# Patient Record
Sex: Female | Born: 1970 | Race: Black or African American | Hispanic: No | Marital: Married | Smoking: Never smoker
Health system: Southern US, Community
[De-identification: ages and names within clinical notes are randomized; demographics above are authoritative.]

## PROBLEM LIST (undated history)

## (undated) DIAGNOSIS — I493 Ventricular premature depolarization: Secondary | ICD-10-CM

## (undated) DIAGNOSIS — C50919 Malignant neoplasm of unspecified site of unspecified female breast: Secondary | ICD-10-CM

## (undated) DIAGNOSIS — Z79810 Long term (current) use of selective estrogen receptor modulators (SERMs): Secondary | ICD-10-CM

## (undated) DIAGNOSIS — E785 Hyperlipidemia, unspecified: Secondary | ICD-10-CM

## (undated) DIAGNOSIS — F329 Major depressive disorder, single episode, unspecified: Secondary | ICD-10-CM

## (undated) DIAGNOSIS — Z923 Personal history of irradiation: Secondary | ICD-10-CM

## (undated) DIAGNOSIS — E119 Type 2 diabetes mellitus without complications: Secondary | ICD-10-CM

## (undated) DIAGNOSIS — Z9221 Personal history of antineoplastic chemotherapy: Secondary | ICD-10-CM

## (undated) DIAGNOSIS — F419 Anxiety disorder, unspecified: Secondary | ICD-10-CM

## (undated) HISTORY — DX: Hyperlipidemia, unspecified: E78.5

## (undated) HISTORY — DX: Major depressive disorder, single episode, unspecified: F32.9

## (undated) HISTORY — PX: LIVER BIOPSY: SHX301

## (undated) HISTORY — DX: Anxiety disorder, unspecified: F41.9

## (undated) HISTORY — DX: Ventricular premature depolarization: I49.3

## (undated) HISTORY — DX: Malignant neoplasm of unspecified site of unspecified female breast: C50.919

---

## 1999-08-18 HISTORY — PX: CYSTOSCOPY: SUR368

## 2000-11-23 ENCOUNTER — Inpatient Hospital Stay (HOSPITAL_COMMUNITY): Admission: AD | Admit: 2000-11-23 | Discharge: 2000-11-25 | Payer: Self-pay | Admitting: Obstetrics and Gynecology

## 2001-06-17 ENCOUNTER — Other Ambulatory Visit: Admission: RE | Admit: 2001-06-17 | Discharge: 2001-06-17 | Payer: Self-pay | Admitting: Internal Medicine

## 2002-06-19 ENCOUNTER — Ambulatory Visit (HOSPITAL_COMMUNITY): Admission: RE | Admit: 2002-06-19 | Discharge: 2002-06-19 | Payer: Self-pay | Admitting: Internal Medicine

## 2002-06-19 ENCOUNTER — Encounter: Payer: Self-pay | Admitting: Internal Medicine

## 2002-08-17 HISTORY — PX: TUBAL LIGATION: SHX77

## 2002-08-17 HISTORY — PX: NEVUS EXCISION: SHX2090

## 2002-08-17 HISTORY — PX: RECTOCELE REPAIR: SHX761

## 2003-02-06 ENCOUNTER — Ambulatory Visit (HOSPITAL_COMMUNITY): Admission: RE | Admit: 2003-02-06 | Discharge: 2003-02-06 | Payer: Self-pay | Admitting: Obstetrics and Gynecology

## 2003-02-09 ENCOUNTER — Inpatient Hospital Stay (HOSPITAL_COMMUNITY): Admission: RE | Admit: 2003-02-09 | Discharge: 2003-02-12 | Payer: Self-pay | Admitting: Obstetrics and Gynecology

## 2003-04-19 ENCOUNTER — Ambulatory Visit (HOSPITAL_COMMUNITY): Admission: RE | Admit: 2003-04-19 | Discharge: 2003-04-19 | Payer: Self-pay | Admitting: Obstetrics and Gynecology

## 2004-11-04 ENCOUNTER — Ambulatory Visit: Payer: Self-pay | Admitting: Family Medicine

## 2004-11-13 ENCOUNTER — Ambulatory Visit (HOSPITAL_COMMUNITY): Admission: RE | Admit: 2004-11-13 | Discharge: 2004-11-13 | Payer: Self-pay | Admitting: Family Medicine

## 2004-11-17 ENCOUNTER — Ambulatory Visit: Payer: Self-pay | Admitting: Family Medicine

## 2007-03-11 ENCOUNTER — Ambulatory Visit: Payer: Self-pay | Admitting: Internal Medicine

## 2007-03-23 ENCOUNTER — Ambulatory Visit (HOSPITAL_COMMUNITY): Admission: RE | Admit: 2007-03-23 | Discharge: 2007-03-23 | Payer: Self-pay | Admitting: Internal Medicine

## 2007-03-24 ENCOUNTER — Telehealth (INDEPENDENT_AMBULATORY_CARE_PROVIDER_SITE_OTHER): Payer: Self-pay | Admitting: *Deleted

## 2007-03-25 ENCOUNTER — Encounter (INDEPENDENT_AMBULATORY_CARE_PROVIDER_SITE_OTHER): Payer: Self-pay | Admitting: Internal Medicine

## 2007-08-18 ENCOUNTER — Encounter: Payer: Self-pay | Admitting: Physician Assistant

## 2007-09-16 ENCOUNTER — Encounter (INDEPENDENT_AMBULATORY_CARE_PROVIDER_SITE_OTHER): Payer: Self-pay | Admitting: Internal Medicine

## 2007-09-16 ENCOUNTER — Ambulatory Visit: Payer: Self-pay | Admitting: Internal Medicine

## 2007-09-16 ENCOUNTER — Other Ambulatory Visit: Admission: RE | Admit: 2007-09-16 | Discharge: 2007-09-16 | Payer: Self-pay | Admitting: Internal Medicine

## 2007-09-16 DIAGNOSIS — J310 Chronic rhinitis: Secondary | ICD-10-CM | POA: Insufficient documentation

## 2007-09-16 LAB — CONVERTED CEMR LAB
Bacteria, UA: NONE SEEN
Bilirubin Urine: NEGATIVE
Ketones, urine, test strip: NEGATIVE
Protein, U semiquant: NEGATIVE
WBC Urine, dipstick: NEGATIVE
WBC, UA: NONE SEEN cells/hpf (ref <3)
pH: 6.5

## 2007-09-17 ENCOUNTER — Encounter (INDEPENDENT_AMBULATORY_CARE_PROVIDER_SITE_OTHER): Payer: Self-pay | Admitting: Internal Medicine

## 2007-09-20 ENCOUNTER — Encounter (INDEPENDENT_AMBULATORY_CARE_PROVIDER_SITE_OTHER): Payer: Self-pay | Admitting: Internal Medicine

## 2007-09-20 ENCOUNTER — Telehealth (INDEPENDENT_AMBULATORY_CARE_PROVIDER_SITE_OTHER): Payer: Self-pay | Admitting: *Deleted

## 2007-09-21 ENCOUNTER — Telehealth (INDEPENDENT_AMBULATORY_CARE_PROVIDER_SITE_OTHER): Payer: Self-pay | Admitting: *Deleted

## 2007-09-21 LAB — CONVERTED CEMR LAB
BUN: 10 mg/dL (ref 6–23)
CO2: 24 meq/L (ref 19–32)
Calcium: 8.6 mg/dL (ref 8.4–10.5)
Creatinine, Ser: 0.7 mg/dL (ref 0.40–1.20)
Potassium: 3.6 meq/L (ref 3.5–5.3)
Sodium: 141 meq/L (ref 135–145)
VLDL: 10 mg/dL (ref 0–40)

## 2007-09-23 ENCOUNTER — Encounter (INDEPENDENT_AMBULATORY_CARE_PROVIDER_SITE_OTHER): Payer: Self-pay | Admitting: Internal Medicine

## 2008-03-16 ENCOUNTER — Ambulatory Visit: Payer: Self-pay | Admitting: Internal Medicine

## 2008-11-27 ENCOUNTER — Ambulatory Visit: Payer: Self-pay | Admitting: Internal Medicine

## 2008-11-27 ENCOUNTER — Other Ambulatory Visit: Admission: RE | Admit: 2008-11-27 | Discharge: 2008-11-27 | Payer: Self-pay | Admitting: Internal Medicine

## 2008-11-27 ENCOUNTER — Encounter (INDEPENDENT_AMBULATORY_CARE_PROVIDER_SITE_OTHER): Payer: Self-pay | Admitting: Internal Medicine

## 2009-03-19 ENCOUNTER — Ambulatory Visit: Payer: Self-pay | Admitting: Internal Medicine

## 2009-03-20 ENCOUNTER — Encounter (INDEPENDENT_AMBULATORY_CARE_PROVIDER_SITE_OTHER): Payer: Self-pay | Admitting: Internal Medicine

## 2009-11-26 ENCOUNTER — Ambulatory Visit: Payer: Self-pay | Admitting: Family Medicine

## 2009-11-26 DIAGNOSIS — R109 Unspecified abdominal pain: Secondary | ICD-10-CM | POA: Insufficient documentation

## 2009-11-26 DIAGNOSIS — E669 Obesity, unspecified: Secondary | ICD-10-CM

## 2009-11-27 ENCOUNTER — Encounter: Payer: Self-pay | Admitting: Physician Assistant

## 2009-11-27 LAB — CONVERTED CEMR LAB: Gardnerella vaginalis: POSITIVE — AB

## 2009-12-06 ENCOUNTER — Ambulatory Visit (HOSPITAL_COMMUNITY): Admission: RE | Admit: 2009-12-06 | Discharge: 2009-12-06 | Payer: Self-pay | Admitting: Family Medicine

## 2009-12-17 ENCOUNTER — Encounter: Payer: Self-pay | Admitting: Physician Assistant

## 2009-12-17 ENCOUNTER — Ambulatory Visit: Payer: Self-pay | Admitting: Family Medicine

## 2009-12-17 DIAGNOSIS — K644 Residual hemorrhoidal skin tags: Secondary | ICD-10-CM | POA: Insufficient documentation

## 2009-12-20 ENCOUNTER — Encounter: Admission: RE | Admit: 2009-12-20 | Discharge: 2009-12-20 | Payer: Self-pay | Admitting: Family Medicine

## 2010-05-14 ENCOUNTER — Emergency Department (HOSPITAL_COMMUNITY): Admission: EM | Admit: 2010-05-14 | Discharge: 2010-05-15 | Payer: Self-pay | Admitting: Emergency Medicine

## 2010-05-15 ENCOUNTER — Ambulatory Visit: Payer: Self-pay | Admitting: Family Medicine

## 2010-05-15 ENCOUNTER — Encounter: Payer: Self-pay | Admitting: Cardiology

## 2010-05-15 ENCOUNTER — Encounter (INDEPENDENT_AMBULATORY_CARE_PROVIDER_SITE_OTHER): Payer: Self-pay | Admitting: *Deleted

## 2010-05-15 ENCOUNTER — Encounter: Payer: Self-pay | Admitting: Physician Assistant

## 2010-05-15 DIAGNOSIS — J039 Acute tonsillitis, unspecified: Secondary | ICD-10-CM

## 2010-05-15 DIAGNOSIS — M79609 Pain in unspecified limb: Secondary | ICD-10-CM

## 2010-05-15 LAB — CONVERTED CEMR LAB
ALT: 20 units/L
AST: 16 units/L
AST: 16 units/L
AST: 16 units/L
Albumin: 4.3 g/dL
Albumin: 4.3 g/dL
Alkaline Phosphatase: 66 units/L
Alkaline Phosphatase: 66 units/L
BUN: 8 mg/dL
Calcium: 9.3 mg/dL
Calcium: 9.3 mg/dL
Calcium: 9.3 mg/dL
Chloride: 104 meq/L
Cholesterol: 235 mg/dL
Creatinine, Ser: 0.81 mg/dL
Creatinine, Ser: 0.81 mg/dL
Glucose, Bld: 111 mg/dL
Glucose, Bld: 111 mg/dL
HCT: 40.5 %
HDL: 56 mg/dL
HDL: 56 mg/dL
Hemoglobin: 13.3 g/dL
Hemoglobin: 13.3 g/dL
Hgb A1c MFr Bld: 6.5 %
LDL Cholesterol: 168 mg/dL
LDL Cholesterol: 168 mg/dL
MCV: 81 fL
Platelets: 318 10*3/uL
Platelets: 318 10*3/uL
Platelets: 318 10*3/uL
Potassium: 3.6 meq/L
RDW: 13.9 %
Sodium: 140 meq/L
Sodium: 140 meq/L
Total Protein: 7.1 g/dL
Total Protein: 7.1 g/dL
Triglycerides: 54 mg/dL
WBC: 4.9 10*3/uL
WBC: 4.9 10*3/uL

## 2010-05-16 ENCOUNTER — Ambulatory Visit: Payer: Self-pay | Admitting: Family Medicine

## 2010-05-16 ENCOUNTER — Encounter (INDEPENDENT_AMBULATORY_CARE_PROVIDER_SITE_OTHER): Payer: Self-pay | Admitting: *Deleted

## 2010-05-16 ENCOUNTER — Encounter: Payer: Self-pay | Admitting: Physician Assistant

## 2010-05-19 ENCOUNTER — Ambulatory Visit: Payer: Self-pay | Admitting: Cardiology

## 2010-05-19 DIAGNOSIS — R9431 Abnormal electrocardiogram [ECG] [EKG]: Secondary | ICD-10-CM | POA: Insufficient documentation

## 2010-05-19 DIAGNOSIS — R002 Palpitations: Secondary | ICD-10-CM | POA: Insufficient documentation

## 2010-05-19 DIAGNOSIS — E119 Type 2 diabetes mellitus without complications: Secondary | ICD-10-CM

## 2010-05-19 DIAGNOSIS — E785 Hyperlipidemia, unspecified: Secondary | ICD-10-CM

## 2010-05-20 ENCOUNTER — Encounter: Payer: Self-pay | Admitting: Cardiology

## 2010-05-23 ENCOUNTER — Encounter: Payer: Self-pay | Admitting: Cardiology

## 2010-05-23 ENCOUNTER — Ambulatory Visit (HOSPITAL_COMMUNITY): Admission: RE | Admit: 2010-05-23 | Discharge: 2010-05-23 | Payer: Self-pay | Admitting: Cardiology

## 2010-05-23 ENCOUNTER — Ambulatory Visit: Payer: Self-pay | Admitting: Cardiology

## 2010-06-05 ENCOUNTER — Ambulatory Visit: Payer: Self-pay | Admitting: Family Medicine

## 2010-06-27 ENCOUNTER — Encounter (INDEPENDENT_AMBULATORY_CARE_PROVIDER_SITE_OTHER): Payer: Self-pay | Admitting: *Deleted

## 2010-07-30 ENCOUNTER — Encounter (INDEPENDENT_AMBULATORY_CARE_PROVIDER_SITE_OTHER): Payer: Self-pay | Admitting: *Deleted

## 2010-08-05 ENCOUNTER — Ambulatory Visit: Payer: Self-pay | Admitting: Cardiology

## 2010-08-07 ENCOUNTER — Encounter: Payer: Self-pay | Admitting: Cardiology

## 2010-08-20 LAB — HM PAP SMEAR

## 2010-08-20 LAB — HM MAMMOGRAPHY

## 2010-08-26 ENCOUNTER — Encounter: Payer: Self-pay | Admitting: Family Medicine

## 2010-08-26 ENCOUNTER — Ambulatory Visit: Admit: 2010-08-26 | Payer: Self-pay | Admitting: Family Medicine

## 2010-09-07 ENCOUNTER — Encounter: Payer: Self-pay | Admitting: Family Medicine

## 2010-09-14 LAB — CONVERTED CEMR LAB
ALT: 20 units/L (ref 0–35)
AST: 16 units/L (ref 0–37)
Albumin: 4.3 g/dL (ref 3.5–5.2)
CO2: 24 meq/L (ref 19–32)
Chloride: 104 meq/L (ref 96–112)
Cholesterol: 235 mg/dL — ABNORMAL HIGH (ref 0–200)
Glucose, Bld: 111 mg/dL — ABNORMAL HIGH (ref 70–99)
HDL: 56 mg/dL (ref >39)
Hemoglobin: 13.3 g/dL (ref 12.0–15.0)
LDL Cholesterol: 168 mg/dL — ABNORMAL HIGH (ref 0–99)
MCV: 81 fL (ref 78.0–100.0)
Platelets: 318 10*3/uL (ref 150–400)
Potassium: 3.6 meq/L (ref 3.5–5.3)
RBC: 5 M/uL (ref 3.87–5.11)
Sodium: 140 meq/L (ref 135–145)
Total CHOL/HDL Ratio: 4.2
Triglycerides: 54 mg/dL (ref <150)
VLDL: 11 mg/dL (ref 0–40)

## 2010-09-16 NOTE — Assessment & Plan Note (Signed)
Summary: Consult from Dr. Lodema Hong for PVCs and EKG abnormalities   Visit Type:  Initial Consult Referring Provider:  Dr. [name] Primary Provider:  Esperanza Sheets PA   History of Present Illness: [name] is seen in the office today at the kind request of Esperanza Sheets and Dr. Lodema Hong for evaluation of EKG abnormalities and ventricular arrhythmias.  This nice woman has enjoyed generally excellent health.  She recently was found to have borderline diabetes and moderate hyperlipidemia, but is deferring medical therapy while she attempts to modify diet, exercise and body weight.  She is made and enviable start, losing more than 20 pounds and exercising regularly.  She has no history of cardiac problems nor has she previously been evaluated by a cardiologist or undergone any significant cardiac testing.  Exercise tolerance is good without dyspnea, chest discomfort, lightheadedness, syncope, orthopnea, PND or pedal edema.  She is premenopausal.  She is a lifelong nonsmoker.  Patient notes palpitations at night that are not suggestive of ventricular bigeminy.  She senses a rapid regular tachycardia lasting seconds to minutes.  On one occasion, she experienced numbness and discomfort in her left arm.  She recently was treated with Augmentin for pharyngitis and developed severe diarrhea.  Current Medications (verified): 1)  None  Allergies: 1)  ! Flagyl 2)  Sulfa  Comments:  Nurse/Medical Assistant: patient was suppose to be on metformin 500 mg two times a day and pravastatin  but patient hasn't started either one of these  medicine wanted to wait 1 month she is changing her eating habits and exercising wants to see if this will help.  Past History:  Past Surgical History: Last updated: 06-02-2010 Tubal ligation-2004 Rectocele repair-2004 Cystoscopy-2001 Excision of facial nevi-2004 Normal spontaneous vaginal deliveries-2002 and 2004  Family History: Last updated:  Jun 02, 2010 Father-deceased-approximately age 44; cause unknown Mother-deceased-80-HTN, DM, MI Siblings: 4 brothers, one of whom has hypertension and diabetes and one hypertension alone; 4 sisters, one with hypertension,  and one with diabetes  Social History: Last updated: 06-02-10 Married lives with spouse; 2 sons age 20 and 66 Occupation: Agricultural engineer at [employer] Never Smoked Alcohol use-no Drug use-no  Past Medical History: Diabetes-diet therapy Hyperlipidemia-diet therapy per patient request 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 Hematuria--evaluated in [place]--?2000/2001 with negative results     Family History: Father-deceased-approximately age 16; cause unknown Mother-deceased-80-HTN, DM, MI Siblings: 4 brothers, one of whom has hypertension and diabetes and one hypertension alone; 4 sisters, one with hypertension,  and one with diabetes  Social History: Married lives with spouse; 2 sons age 74 and 65 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  Vital Signs:  Patient profile:   40 year old female Menstrual status:  regular Weight:      173 pounds BMI:     29.12 Pulse rate:   89 / minute BP sitting:   135 / 82  (right arm)  Vitals Entered By: Dreama Saa, CNA 06-02-10 2:49 PM)  Physical Exam  General:  Overweight; well-developed; no acute distress: HEENT-[place]/AT; PERRL; EOM intact; conjunctiva and lids nl:  Neck-No JVD; no carotid bruits: Endocrine-borderline diffuse thyromegaly: Lungs-No tachypnea, clear without rales, rhonchi or wheezes: CV-normal PMI; normal S1 and S2:;  Abdomen-BS normal; soft and non-tender without masses or organomegaly: MS-No deformities, cyanosis or clubbing: Neurologic-Nl  cranial nerves; symmetric strength and tone: Skin- Warm, no sig.  lesions: Extremities-Nl distal pulses; no edema    Impression & Recommendations:  Problem # 1:  ELECTROCARDIOGRAM, ABNORMAL (ICD-794.31) Patient has T wave inversion in the inferior leads at this visit, whereas EKG was morphologically normal except for the presence of frequent PVCs just 3 days ago.  An echocardiogram will be obtained to exclude structural heart disease.  TSH and other basic laboratory studies were normal.    Problem # 2:  HYPERLIPIDEMIA (ICD-272.4) Patient has moderate hyperlipidemia.  In the very low risk of coronary disease, treatment can be dietary at the present time.  Problem # 3:  DIABETES MELLITUS, TYPE II (ICD-250.00) Patient prefers nonpharmacologic therapy for her diabetes.  She was strongly encouraged with regard to her success in weight reduction and exercise to date.  Problem # 4:  PALPITATIONS (ICD-785.1) Ms. Dow has palpitations that do not sound as if they are related to PVCs.  A supraventricular tachycardia is possible.  If the symptoms persist, she will be provided with an event recorder in an attempt to document a significant arrhythmia.  I will reassess this nice woman and 1-2 months at which time an EKG will be repeated.  Other Orders: 2-D Echocardiogram (2D Echo)  EKG  Procedure date:  05/19/2010  Findings:      Normal sinus rhythm with late cycle PVCs in a bigeminal pattern Nondiagnostic Q waves in leads I, L, and V3-V6. Inferior T-wave inversion Comparison with previous tracing of 05/15/10: R Wave progression is now normal; inferior T waves are now inverted.   Patient Instructions: 1)  Your physician recommends that you schedule a follow-up appointment in: 2 months 2)  Your physician has requested that you have an echocardiogram.  Echocardiography is a painless test that uses sound waves to create images of your heart. It provides your doctor with information about the size and shape of your heart and how well your heart's chambers and  valves are working.  This procedure takes approximately one hour. There are no restrictions for this procedure.

## 2010-09-16 NOTE — Letter (Signed)
Summary: [place] [employer], agricultural at [employer] [facility]  [address] [place] [address]   Phone: [phone]  Fax: [phone]      June 27, 2010 MRN: [id number]   Brenda Avery [address] [place], [place]  [address]   Dear Ms. Schupp,  Your test ordered by Selena Batten has been reviewed by your physician (or physician assistant) and was found to be normal or stable. Your physician (or physician assistant) felt no changes were needed at this time.  __x__ Echocardiogram  ____ Cardiac Stress Test  ____ Lab Work  ____ Peripheral vascular study of arms, legs or neck  ____ CT scan or X-ray  ____ Lung or Breathing test  ____ Other:  No change in medical treatment at this time, per Dr. Dietrich Pates.  Thank you, Tammy Allyne Gee RN    Idamay Bing, MD, Lenise Arena.C.Gaylord Shih, MD, F.A.C.C Lewayne Bunting, MD, F.A.C.C Nona Dell, MD, F.A.C.C Charlton Haws, MD, Lenise Arena.C.C

## 2010-09-16 NOTE — Letter (Signed)
Summary: Out of Work  [facility]  [address]   [place], [place] [address]   Phone: [phone]  Fax: [phone]    Dec 17, 2009   Employee:  JASELLE PRYER    To Whom It May Concern:   For Medical reasons, please excuse the above named employee from work for the following dates:  Start:   12/17/09  End:   12/19/09 may return to work   If you need additional information, please feel free to contact our office.         Sincerely,    Esperanza Sheets PA

## 2010-09-16 NOTE — Assessment & Plan Note (Signed)
Summary: er follow up- room 2   Vital Signs:  Patient profile:   40 year old female Menstrual status:  regular Height:      64.75 inches Weight:      177.50 pounds BMI:     29.87 O2 Sat:      99 % on Room air Pulse rate:   78 / minute Resp:     16 per minute BP sitting:   112 / 62  (left arm)  Vitals Entered By: Adella Hare LPN (May 15, 2010 9:20 AM) CC: er follow up- arm numbness Is Patient Diabetic? No Pain Assessment Patient in pain? no        Primary Provider:  Esperanza Sheets PA  CC:  er follow up- arm numbness.  History of Present Illness: Pt was seen at ER early this am. Was at home lying down.  Felt numbness in Lt arm and felt like her heart was beating fast.  She stood up "and felt like I was sinking."  No chest pain, dib or diaphoresis.  Has felt like she has been having irrg heart beats for a few weeks now.  Notices it more at night when she is lying in bed. The numbness was from her shoulder to her wrist.  Also had pain on the Lt side of her neck. Pt states the ER checked her BP only.  No labs, EKG, etc.  Also has been on Augmentin for sore throat x 3 days.  Had developed some diarrhea.  So ER wrote a prescription for ZPak.  Throat still feels a little swollen and sore.  Feels hard to swallow. No fever or chills.  Allergies (verified): 1)  ! Flagyl 2)  Sulfa  Past History:  Past medical history reviewed for relevance to current acute and chronic problems.  Past Medical History: Reviewed history from 09/16/2007 and no changes required. microscopic hematuria--evaluated in [place]--?2000/2001  Family History: father-deceased-63-DKA mother-deceased-80-HTN, DM, MI Siblings x8--HTN in older sibs 2 sons  Review of Systems General:  Denies chills and fever. ENT:  Complains of sore throat; denies earache and nasal congestion. CV:  Complains of palpitations; denies chest pain or discomfort. Resp:  Complains of cough; denies sputum productive. MS:   Denies joint pain and thoracic pain.  Physical Exam  General:  Well-developed,well-nourished,in no acute distress; alert,appropriate and cooperative throughout examination Head:  Normocephalic and atraumatic without obvious abnormalities. No apparent alopecia or balding. Ears:  External ear exam shows no significant lesions or deformities.  Otoscopic examination reveals clear canals, tympanic membranes are intact bilaterally without bulging, retraction, inflammation or discharge. Hearing is grossly normal bilaterally. Nose:  External nasal examination shows no deformity or inflammation. Nasal mucosa are pink and moist without lesions or exudates. Mouth:  good dentition.  tonsils 2+ bilat and erythematousgood dentition.   Neck:  No deformities, masses, or tenderness noted. Lungs:  Normal respiratory effort, chest expands symmetrically. Lungs are clear to auscultation, no crackles or wheezes. Heart:  no murmur and irregular rhythm.  no murmur and irregular rhythm.   Msk:  C Spine and LUE:  FROM.  Muscular TTP across posterior shoulder and upper arm.  Nontender remainder of UE, spinous process and shoulder joint.   Pulses:  R radial normal and L radial normal.  R radial normal and L radial normal.   Extremities:  No clubbing, cyanosis, edema, or deformity noted with normal full range of motion of all joints.   Neurologic:  alert & oriented X3, strength normal in  all extremities, sensation intact to light touch, gait normal, and DTRs symmetrical and normal.  alert & oriented X3, strength normal in all extremities, sensation intact to light touch, gait normal, and DTRs symmetrical and normal.   Skin:  Intact without suspicious lesions or rashes Cervical Nodes:  No lymphadenopathy noted Psych:  Cognition and judgment appear intact. Alert and cooperative with normal attention span and concentration. No apparent delusions, illusions, hallucinations   Impression & Recommendations:  Problem # 1:   TONSILLITIS, ACUTE (ICD-463) Assessment New Begin ZPak as Rxd by ER.  Problem # 2:  BIGEMINY (ICD-427.89) Assessment: New  Orders: EKG w/ Interpretation (93000) Cardiology Referral (Cardiology)  Problem # 3:  ARM PAIN, LEFT (ICD-729.5) Assessment: New Use Ibuprofen and Heat to affected areas.  Patient Instructions: 1)  Please schedule a follow-up appointment in 2 weeks. 2)  I have referred you to the cardiologist for your irregular heart beats. 3)  Start the new antibiotic prescribed by the ER. 4)  Use Ibuprofen as needed for discomfort. 5)  Use warm compresses to your shoulder.

## 2010-09-16 NOTE — Miscellaneous (Signed)
Summary: labs cbcd,cmp,lipid,tsh,a1c,05/15/2010  Clinical Lists Changes  Observations: Added new observation of CALCIUM: 9.3 mg/dL (19/14/7829 5:62) Added new observation of ALBUMIN: 4.3 g/dL (13/03/6577 4:69) Added new observation of PROTEIN, TOT: 7.1 g/dL (62/95/2841 3:24) Added new observation of SGPT (ALT): 20 units/L (05/15/2010 9:15) Added new observation of SGOT (AST): 16 units/L (05/15/2010 9:15) Added new observation of ALK PHOS: 66 units/L (05/15/2010 9:15) Added new observation of CREATININE: 0.81 mg/dL (40/05/2724 3:66) Added new observation of BUN: 8 mg/dL (44/10/4740 5:95) Added new observation of BG RANDOM: 111 mg/dL (63/87/5643 3:29) Added new observation of CO2 PLSM/SER: 24 meq/L (05/15/2010 9:15) Added new observation of CL SERUM: 104 meq/L (05/15/2010 9:15) Added new observation of K SERUM: 3.6 meq/L (05/15/2010 9:15) Added new observation of NA: 140 meq/L (05/15/2010 9:15) Added new observation of LDL: 168 mg/dL (51/88/4166 0:63) Added new observation of HDL: 56 mg/dL (01/60/1093 2:35) Added new observation of TRIGLYC TOT: 54 mg/dL (57/32/2025 4:27) Added new observation of CHOLESTEROL: 235 mg/dL (02/07/7627 3:15) Added new observation of TSH: 2.321 microintl units/mL (05/15/2010 9:15) Added new observation of HGBA1C: 6.5 % (05/15/2010 9:15) Added new observation of PLATELETK/UL: 318 K/uL (05/15/2010 9:15) Added new observation of RDW: 13.9 % (05/15/2010 9:15) Added new observation of MCHC RBC: 32.8 g/dL (17/61/6073 7:10) Added new observation of MCV: 81.0 fL (05/15/2010 9:15) Added new observation of HCT: 40.5 % (05/15/2010 9:15) Added new observation of HGB: 13.3 g/dL (62/69/4854 6:27) Added new observation of RBC M/UL: 5.00 M/uL (05/15/2010 9:15) Added new observation of WBC COUNT: 4.9 10*3/microliter (05/15/2010 9:15)

## 2010-09-16 NOTE — Letter (Signed)
Summary: FMLA PAPERS  [employer]MLA PAPERS   Imported By: Lind Guest 05/19/2010 08:31:29  _____________________________________________________________________  External Attachment:    Type:   Image     Comment:   External Document

## 2010-09-16 NOTE — Assessment & Plan Note (Signed)
Summary: new patient- room 1   Vital Signs:  Patient profile:   40 year old female Menstrual status:  regular Height:      64.75 inches Weight:      192.50 pounds BMI:     32.40 O2 Sat:      98 % on Room air Pulse rate:   92 / minute Resp:     16 per minute BP sitting:   110 / 70  (left arm)  Vitals Entered By: Adella Hare LPN (November 26, 2009 10:14 AM) CC: new patient Is Patient Diabetic? No Pain Assessment Patient in pain? no      Comments patient mentions some intermittent ovary discomfort, mild to moderate   Primary Provider:  Esperanza Sheets PA  CC:  new patient.  History of Present Illness: New pt here to establish care with new PCP.  Pt c/o RLQ abd/pelvic pain intermittently x few days. "Ovary pain." LMP approx 11-04-09.  Have been irrg x 6-8 mos.  Prev would start on specific day, now comes within a week of when she would expect it. Has had pain similar to this in past. " Little" vag dischg x 2-3 days.  Tylenol works well to relieve pain.  Last physical /pap 1 yr ago.  Last Mamm 2008.  Allergies: 1)  Sulfa  Past History:  Past medical, surgical, family and social histories (including risk factors) reviewed for relevance to current acute and chronic problems.  Past Medical History: Reviewed history from 09/16/2007 and no changes required. microscopic hematuria--evaluated in [place]--?2000/2001  Past Surgical History: Reviewed history from 09/16/2007 and no changes required. Tubal ligation rectal wall repair cystoscopy  Family History: Reviewed history from 11/27/2008 and no changes required. father-deceased-63-DKA mother-deceased-80-HTN, DM Siblings x8--HTN in older sibs 2 sons  Social History: Reviewed history from 03/11/2007 and no changes required. 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 GI:   Complains of abdominal pain; denies bloody stools, change in bowel habits, constipation, vomiting, and vomiting blood. GU:  Denies dysuria, incontinence, and urinary frequency.  Physical Exam  General:  Well-developed,well-nourished,in no acute distress; alert,appropriate and cooperative throughout examination Head:  Normocephalic and atraumatic without obvious abnormalities. No apparent alopecia or balding. Ears:  External ear exam shows no significant lesions or deformities.  Otoscopic examination reveals clear canals, tympanic membranes are intact bilaterally without bulging, retraction, inflammation or discharge. Hearing is grossly normal bilaterally. Nose:  External nasal examination shows no deformity or inflammation. Nasal mucosa are pink and moist without lesions or exudates. Mouth:  Oral mucosa and oropharynx without lesions or exudates.  Teeth in good repair. Neck:  No deformities, masses, or tenderness noted. Lungs:  Normal respiratory effort, chest expands symmetrically. Lungs are clear to auscultation, no crackles or wheezes. Heart:  Normal rate and regular rhythm. S1 and S2 normal without gallop, murmur, click, rub or other extra sounds. Abdomen:  soft, no masses, no guarding, no rigidity, no hepatomegaly, and no splenomegaly.  soft, no masses, no guarding, no rigidity, no hepatomegaly, and no splenomegaly.   Genitalia:  normal introitus, no external lesions, mucosa pink and moist, no vaginal or cervical lesions, and normal uterus size and position.  Small amount of thin clear vag dischg.  Bimanual exam neg except tender Rt ovary.  No masses. Cervical Nodes:  No lymphadenopathy noted Psych:  Cognition and judgment appear intact. Alert and cooperative with normal  attention span and concentration. No apparent delusions, illusions, hallucinations   Impression & Recommendations:  Problem # 1:  PELVIC PAIN, ACUTE (ICD-789.09) Assessment New  Orders: Pelvic Ultrasound (Sono  Pelvis)  Problem # 2:  VAGINITIS (ICD-616.10) Assessment: New Will await wet prep results for treatment if needed. Orders: T-Wet Prep by [facility] [phone])  Complete Medication List: 1)  Mylanta 200-200-20 Mg/61ml Susp (Alum & mag hydroxide-simeth) .... 2 teaspoons as needed  Other Orders: Miscellaneous Other Radiology (Misc Other Rad) T-Comprehensive Metabolic Panel [phone]) T-Lipid Profile [phone]) T-CBC No Diff ([id number]) T-TSH [id number]) T-Vitamin D (25-Hydroxy) [id number]) T- Hemoglobin A1C ([id number])  Patient Instructions: 1)  Please schedule a follow-up appointment in 1 month. 2)  You may use Tylenol or Aleve for pain as needed. 3)  I have ordered a Pelvic ultrasound.  This will be scheduled for you. 4)  If your syptoms change or worsen call the office.

## 2010-09-16 NOTE — Assessment & Plan Note (Signed)
Summary: F UP   Vital Signs:  Patient profile:   40 year old female Menstrual status:  regular Height:      64.75 inches Weight:      173.25 pounds BMI:     29.16 O2 Sat:      99 % on Room air Pulse rate:   89 / minute Resp:     16 per minute BP sitting:   130 / 80  (left arm)  Vitals Entered By: Mauricia Area CMA (June 05, 2010 3:44 PM) CC: follow up   Referring Saraiya Kozma:  Dr. [name] Primary Janelys Glassner:  Esperanza Sheets PA  CC:  follow up.  History of Present Illness: Pt presents today for check up.  Hx of DM and Hyperlipidemia.  Pt is trying to control with wt loss and exercise.  She has lost an additional 4.25 lbs in the last one mos.  She is walking daily and going to an exercise class 4 days a week.  Eating "much healthier".  States she still has intermittent palpitations, but not as much as previuos.  Notices more with anxiety. No chest pain.  Pt is due for her pap.  Mamm UTD, due again 5-12. Td UTD, 8-11.       Current Medications (verified): 1)  None  Allergies (verified): 1)  ! Flagyl 2)  Sulfa  Past History:  Past medical history reviewed for relevance to current acute and chronic problems.  Past Medical History: Reviewed history from 05/19/2010 and no changes required. 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 ENT:  Denies earache, nasal congestion, and sore throat. CV:  Complains of palpitations; denies chest pain or discomfort. Resp:  Denies cough and shortness of breath. GI:  Denies abdominal pain, change in bowel habits, nausea, and vomiting.  Physical Exam  General:  Well-developed,well-nourished,in no acute distress; alert,appropriate and cooperative throughout examination Head:  Normocephalic and atraumatic without obvious  abnormalities. No apparent alopecia or balding. Ears:  External ear exam shows no significant lesions or deformities.  Otoscopic examination reveals clear canals, tympanic membranes are intact bilaterally without bulging, retraction, inflammation or discharge. Hearing is grossly normal bilaterally. Nose:  External nasal examination shows no deformity or inflammation. Nasal mucosa are pink and moist without lesions or exudates. Mouth:  Oral mucosa and oropharynx without lesions or exudates.  Teeth in good repair. Neck:  No deformities, masses, or tenderness noted. Lungs:  Normal respiratory effort, chest expands symmetrically. Lungs are clear to auscultation, no crackles or wheezes. Heart:  Normal rate and regular rhythm. S1 and S2 normal without gallop, murmur, click, rub or other extra sounds. Cervical Nodes:  No lymphadenopathy noted Psych:  Cognition and judgment appear intact. Alert and cooperative with normal attention span and concentration. No apparent delusions, illusions, hallucinations   Impression & Recommendations:  Problem # 1:  PALPITATIONS (ICD-785.1) Assessment Improved  Problem # 2:  DIABETES MELLITUS, TYPE II (ICD-250.00) Assessment: Comment Only  Orders: T-Comprehensive Metabolic Panel [id number]) T- Hemoglobin A1C ([id number])  Labs Reviewed: Creat: 0.81 (05/15/2010)    Reviewed HgBA1c results: 6.5 (05/15/2010)  6.5 (05/15/2010)  Problem # 3:  HYPERLIPIDEMIA (ICD-272.4) Assessment: Comment Only  Orders: T-Comprehensive Metabolic Panel [phone]) T-Lipid Profile [phone])  Labs Reviewed: SGOT: 16 (05/15/2010)   SGPT: 20 (05/15/2010)   HDL:56 (05/15/2010), 56 (05/15/2010)  LDL:168 (05/15/2010), 168 (05/15/2010)  Chol:235 (05/15/2010),  235 (05/15/2010)  Trig:54 (05/15/2010), 54 (05/15/2010)  Patient Instructions: 1)  Schedule pap/physical end of December, beginning of Jan. 2)  Have blood work drawn fasting before your next appt. 3)  Keep you follow  up appt with the cardiologist in December. 4)  Congratulations on your weight loss!  Keep up the good work!   Orders Added: 1)  T-Comprehensive Metabolic Panel [[id number]] 2)  T-Lipid Profile [[id number]] 3)  T- Hemoglobin A1C [[id number]] 4)  Est. Patient Level IV [30865]

## 2010-09-16 NOTE — Letter (Signed)
Summary: rpc chart  rpc chart   Imported By: Curtis Sites 05/29/2010 15:08:09  _____________________________________________________________________  External Attachment:    Type:   Image     Comment:   External Document

## 2010-09-16 NOTE — Assessment & Plan Note (Signed)
Summary: hemmroids?- room 2   Vital Signs:  Patient profile:   40 year old female Menstrual status:  regular Height:      64.75 inches Weight:      188.75 pounds BMI:     31.77 O2 Sat:      99 % on Room air Pulse rate:   98 / minute Resp:     16 per minute BP sitting:   120 / 68  (left arm)  Vitals Entered By: Adella Hare LPN (Dec 18, 5619 1:36 PM) CC: rectal pressure, itching and burning Is Patient Diabetic? No Pain Assessment Patient in pain? no        Primary Provider:  Esperanza Sheets PA  CC:  rectal pressure and itching and burning.  History of Present Illness: Pt is here today concerned that she may have hemorrhoids.  She noticed since yesterday that she has some swelling in her anal area.  It is also painful and itchy.  No bleeding.  Denies constipation though she often does have problems with constipation. States she actually had some loose stools/diarrhea yesterday.  None today.  No abd pain.  Pt also recently had an abn mamm.  Has an appt for f/u studies next week.   Current Medications (verified): 1)  None  Allergies: 1)  ! Flagyl 2)  Sulfa  Past History:  Past medical history reviewed for relevance to current acute and chronic problems.  Past Medical History: Reviewed history from 09/16/2007 and no changes required. microscopic hematuria--evaluated in [place]--?2000/2001  Review of Systems GI:  Complains of diarrhea and hemorrhoids; denies abdominal pain, bloody stools, constipation, and loss of appetite.  Physical Exam  General:  Well-developed,well-nourished,in no acute distress; alert,appropriate and cooperative throughout examination Head:  Normocephalic and atraumatic without obvious abnormalities. No apparent alopecia or balding. Lungs:  Normal respiratory effort, chest expands symmetrically. Lungs are clear to auscultation, no crackles or wheezes. Heart:  Normal rate and regular rhythm. S1 and S2 normal without gallop, murmur, click, rub  or other extra sounds. Abdomen:  soft, non-tender, no masses, no hepatomegaly, and no splenomegaly.   Rectal:  external hemorrhoid(s) x 2 noted.  small, no bleeding. Psych:  Cognition and judgment appear intact. Alert and cooperative with normal attention span and concentration. No apparent delusions, illusions, hallucinations   Impression & Recommendations:  Problem # 1:  HEMORRHOIDS, EXTERNAL (ICD-455.3) Assessment New Warm sitz baths. Discussed hemorrhoids and prevention.  Complete Medication List: 1)  Hydrocortisone Ace-pramoxine 2.5-1 % Crea (Hydrocortisone ace-pramoxine) .... Apply to anal area three times a day for hemorrhoids  Patient Instructions: 1)  Please schedule a follow-up appointment as needed. 2)  High fiber diet & increase fluids to prevent constipation. 3)  Warm soaks in a tub three times a day or as needed. 4)  I have prescribed a cream for you to use three times a day. Prescriptions: HYDROCORTISONE ACE-PRAMOXINE 2.5-1 % CREA (HYDROCORTISONE ACE-PRAMOXINE) apply to anal area three times a day for hemorrhoids  #15 grams x 0   Entered and Authorized by:   Esperanza Sheets PA   Signed by:   Esperanza Sheets PA on 12/17/2009   Method used:   Electronically to        [facility]* (retail)       [address] [place], [place]  [address]       Ph: [phone]       Fax:  [phone]   RxID:   [id number]

## 2010-09-16 NOTE — Assessment & Plan Note (Signed)
Summary: fmla papers   Allergies: 1)  ! Flagyl 2)  Sulfa   Complete Medication List: 1)  Metformin Hcl 500 Mg Tabs (Metformin hcl) .... Take 1 two times a day with food 2)  Pravastatin Sodium 40 Mg Tabs (Pravastatin sodium) .... Take 1 daily at bedtime  Other Orders: Form Completion [phone])

## 2010-09-18 NOTE — Miscellaneous (Signed)
Summary: ECHO 05/23/2010  Clinical Lists Changes  Observations: Added new observation of ECHOINTERP:  Study Conclusions    - Left ventricle: The cavity size was normal. Wall thickness was     normal. Systolic function was normal. The estimated ejection     fraction was in the range of 55% to 60%. Wall motion was normal;     there were no regional wall motion abnormalities.   - Atrial septum: No defect or patent foramen ovale was identified.   Transthoracic echocardiography. M-mode, complete 2D, spectral   Doppler, and color Doppler. Height: Height: 162.6cm. Height: 64in.   Weight: Weight: 79.4kg. Weight: 174.6lb. Body mass index: BMI:   30kg/m^2. Body surface area: BSA: 1.38m^2. Patient status:   Outpatient. Location: Echo laboratory.    --------------------------------------------------------------------  (05/23/2010 15:28)      Echocardiogram  Procedure date:  05/23/2010  Findings:       Study Conclusions    - Left ventricle: The cavity size was normal. Wall thickness was     normal. Systolic function was normal. The estimated ejection     fraction was in the range of 55% to 60%. Wall motion was normal;     there were no regional wall motion abnormalities.   - Atrial septum: No defect or patent foramen ovale was identified.   Transthoracic echocardiography. M-mode, complete 2D, spectral   Doppler, and color Doppler. Height: Height: 162.6cm. Height: 64in.   Weight: Weight: 79.4kg. Weight: 174.6lb. Body mass index: BMI:   30kg/m^2. Body surface area: BSA: 1.59m^2. Patient status:   Outpatient. Location: Echo laboratory.    --------------------------------------------------------------------

## 2010-09-18 NOTE — Letter (Signed)
Summary: 1st missed no show  1st missed no show   Imported By: Lind Guest 08/27/2010 13:26:06  _____________________________________________________________________  External Attachment:    Type:   Image     Comment:   External Document

## 2010-09-18 NOTE — Assessment & Plan Note (Signed)
Summary: F2M   Visit Type:  Follow-up Referring Provider:  Dr. [name] Primary Provider:  Esperanza Sheets PA   History of Present Illness: [name] to the office as scheduled for continued assessment and treatment of hypertension, hyperlipidemia, palpitations and EKG abnormalities.  Since her last visit, she has done extremely well.  She is exercising regularly without any related symptoms.  She has noted no palpitations.  Her weight loss program has stalled in conjunction with increased availability of high caloric food related to the holidays.  She has monitored blood pressure at home and work with entirely normal values noted.  EKG  Procedure date:  08/05/2010  Findings:      Normal sinus rhythm Nondiagnostic inferior Q waves Within normal limits.   Current Medications (verified): 1)  None  Allergies (verified): 1)  ! Flagyl 2)  Sulfa  Comments:  Nurse/Medical Assistant: patient uses [facility]  Past History:  PMH, FH, and Social History reviewed and updated.  Review of Systems       See history of present illness.  Vital Signs:  Patient profile:   40 year old female Menstrual status:  regular Weight:      175 pounds BMI:     29.45 Pulse rate:   79 / minute BP sitting:   131 / 84  (right arm)  Vitals Entered By: Dreama Saa, CNA (August 05, 2010 1:00 PM)  Physical Exam  General:  Overweight, pleasant, and well developed; no acute distress:    Neck-No JVD, no carotid bruits: Lungs-No tachypnea, no rales; no rhonchi;no wheezes:  Cardiovascular-normal PMI; normal S1 and S2; modest basilar early-systolic ejection murmur Abdomen-BS normal; soft and non-tender without masses or organomegaly:  Skin-Warm, no significant lesions: Extremities-Nl distal pulses; no edema:    Impression & Recommendations:  Problem # 1:  ELECTROCARDIOGRAM, ABNORMAL (ICD-794.31) Repeat EKG today is perfectly normal.  The modest irregularities on  previous tracings appear to be of no clinical significance.  Problem # 2:  HYPERLIPIDEMIA (ICD-272.4) Unless she requires pharmacologic therapy for diabetes, I would not institute drug therapy for moderate hyperlipidemia.  She continues to work on her diet with the help of a nutritionist at work.  Additional nonpharmacologic approaches could be utilized such as margarine containing plant sterols, oat bran and other dietary measures.  Problem # 3:  DIABETES MELLITUS, TYPE II (ICD-250.00)  She appears to have modest fasting hyperglycemia with a normal A1c level and does not require pharmacologic therapy.  I suggested that an additional 20 pound weight loss will probably result in complete resolution of her glucose intolerance.  This nice woman does not appear to require continuing cardiology care.  Please let me know at any time an issue arises with which I can assist.  Patient Instructions: 1)  Your physician recommends that you schedule a follow-up appointment in: as needed  2)  Your physician recommends that you continue on your current medications as directed. Please refer to the Current Medication list given to you today.

## 2010-09-18 NOTE — Miscellaneous (Signed)
Summary: LABS CBCD,LIPIDS,CMP,A1C,TSH,05/15/2010  Clinical Lists Changes  Observations: Added new observation of CALCIUM: 9.3 mg/dL (16/05/9603 54:09) Added new observation of ALBUMIN: 4.3 g/dL (81/19/1478 29:56) Added new observation of PROTEIN, TOT: 7.1 g/dL (21/30/8657 84:69) Added new observation of SGPT (ALT): 20 units/L (05/15/2010 15:37) Added new observation of SGOT (AST): 16 units/L (05/15/2010 15:37) Added new observation of ALK PHOS: 66 units/L (05/15/2010 15:37) Added new observation of CREATININE: 0.81 mg/dL (62/95/2841 32:44) Added new observation of BUN: 8 mg/dL (08/19/7251 66:44) Added new observation of BG RANDOM: 111 mg/dL (03/47/4259 56:38) Added new observation of CO2 PLSM/SER: 24 meq/L (05/15/2010 15:37) Added new observation of CL SERUM: 104 meq/L (05/15/2010 15:37) Added new observation of K SERUM: 3.6 meq/L (05/15/2010 15:37) Added new observation of NA: 140 meq/L (05/15/2010 15:37) Added new observation of LDL: 168 mg/dL (75/64/3329 51:88) Added new observation of HDL: 56 mg/dL (41/66/0630 16:01) Added new observation of TRIGLYC TOT: 54 mg/dL (09/32/3557 32:20) Added new observation of CHOLESTEROL: 235 mg/dL (25/42/7062 37:62) Added new observation of TSH: 2.321 microintl units/mL (05/15/2010 15:37) Added new observation of HGBA1C: 6.5 % (05/15/2010 15:37) Added new observation of PLATELETK/UL: 318 K/uL (05/15/2010 15:37) Added new observation of RDW: 13.9 % (05/15/2010 15:37) Added new observation of MCHC RBC: 32.8 g/dL (83/15/1761 60:73) Added new observation of MCV: 81.0 fL (05/15/2010 15:37) Added new observation of HCT: 40.5 % (05/15/2010 15:37) Added new observation of HGB: 13.3 g/dL (71/01/2693 85:46) Added new observation of RBC M/UL: 5.00 M/uL (05/15/2010 15:37) Added new observation of WBC COUNT: 4.9 10*3/microliter (05/15/2010 15:37)

## 2010-09-18 NOTE — Miscellaneous (Signed)
Summary: CBC w/diff, CMP, Lipid  Clinical Lists Changes  Observations: Added new observation of CALCIUM: 9.3 mg/dL (78/29/5621 3:08) Added new observation of ALBUMIN: 4.3 g/dL (65/78/4696 2:95) Added new observation of PROTEIN, TOT: 7.1 g/dL (28/41/3244 0:10) Added new observation of SGPT (ALT): 20 units/L (05/15/2010 9:57) Added new observation of SGOT (AST): 16 units/L (05/15/2010 9:57) Added new observation of ALK PHOS: 66 units/L (05/15/2010 9:57) Added new observation of BILI DIRECT: Total Bili 0.7 mg/dL (27/25/3664 4:03) Added new observation of CREATININE: 0.81 mg/dL (47/42/5956 3:87) Added new observation of BUN: 8 mg/dL (56/43/3295 1:88) Added new observation of BG RANDOM: 111 mg/dL (41/66/0630 1:60) Added new observation of CO2 PLSM/SER: 24 meq/L (05/15/2010 9:57) Added new observation of CL SERUM: 104 meq/L (05/15/2010 9:57) Added new observation of K SERUM: 3.6 meq/L (05/15/2010 9:57) Added new observation of NA: 140 meq/L (05/15/2010 9:57) Added new observation of LDL: 168 mg/dL (10/93/2355 7:32) Added new observation of HDL: 56 mg/dL (20/25/4270 6:23) Added new observation of TRIGLYC TOT: 54 mg/dL (76/28/3151 7:61) Added new observation of CHOLESTEROL: 235 mg/dL (60/73/7106 2:69) Added new observation of PLATELETK/UL: 318 K/uL (05/15/2010 9:57) Added new observation of MCV: 81.0 fL (05/15/2010 9:57) Added new observation of HCT: 40.5 % (05/15/2010 9:57) Added new observation of HGB: 13.3 g/dL (48/54/6270 3:50) Added new observation of WBC COUNT: 4.9 10*3/microliter (05/15/2010 9:57) Added new observation of TSH: 2.321 microintl units/mL (05/15/2010 9:57)

## 2011-01-02 NOTE — H&P (Signed)
   NAME:  Brenda, Avery                           ACCOUNT NO.:  [id number]   MEDICAL RECORD NO.:  [id number]                   PATIENT TYPE:  INP   LOCATION:  A418                                 FACILITY:  [facility]   PHYSICIAN:  Lazaro Arms, M.D.                DATE OF BIRTH:  1971-04-27   DATE OF ADMISSION:  02/09/2003  DATE OF DISCHARGE:                                HISTORY & PHYSICAL   HISTORY OF PRESENT ILLNESS:  The patient is a 40 year old African American  female, gravida 2, para 1, with estimated date of delivery of March 09, 2003,  currently at 72 weeks' gestation, who presented to labor and delivery at 4  cm, had spontaneous rupture while in labor and delivery and laboring.  She  had a three-hour labor last time.  I proceeded immediately to delivery and  by the time I got here and she got settled, she was complete and 0 station.  She pushed three times and over an intact perineum, delivered a viable female  at 2205 with Apgars of 9 and 9, weighing 6 pounds and 7 ounces.  Cord gas  was obtained.  Cord blood was sent.  Placenta was intact.  The uterus firmed  up nicely.  Estimated blood loss for the delivery was 100 mL.  The perineum  was intact.   PAST MEDICAL HISTORY:  Negative.   PAST SURGICAL HISTORY:  Negative.   PAST OBSTETRICAL HISTORY:  Vaginal delivery in April of 2002 at 39 weeks'  gestation.   ALLERGIES:  She is allergic to SULFA DRUGS and METRONIDAZOLE.   PRENATAL LABORATORY DATA:  She is B-positive, antibody screen is negative.  Hepatitis B is negative.  HIV is negative.  Serology is nonreactive.  Pap  was normal.  GC and Chlamydia were both negative.  AFP was normal.  Her  group B strep was negative.  Her Glucola was 135.   PHYSICAL EXAMINATION:  HEENT:  Unremarkable.  NECK:  Thyroid is normal.  LUNGS:  Lungs are clear.  HEART:  Heart is regular rate and rhythm without murmur, regurgitation or  gallop.  BREASTS:  Deferred.  ABDOMEN:  Last fundal height  in the office was 34 cm.  PELVIC:  Cervix is stated as above.  EXTREMITIES:  Extremities warm with no edema.   IMPRESSION:  Status post normal spontaneous vaginal delivery with rapid  labor and short second stage.   PLAN:  Routine postpartum care after NSVD.                                              Lazaro Arms, M.D.   Loraine Maple  D:  02/09/2003  T:  02/10/2003  Job:  [id number]

## 2011-01-02 NOTE — Op Note (Signed)
NAME:  Brenda Avery, Brenda Avery                           ACCOUNT NO.:  [id number]   MEDICAL RECORD NO.:  [id number]                   PATIENT TYPE:   LOCATION:                                       FACILITY:   PHYSICIAN:  Tilda Burrow, M.D.              DATE OF BIRTH:  1971/07/24   DATE OF PROCEDURE:  DATE OF DISCHARGE:                                 OPERATIVE REPORT   PREOPERATIVE DIAGNOSES:  1. Elective sterilization, rectocele.  2. Multiple facial nevi, approximately 20.   POSTOPERATIVE DIAGNOSES:  1. Elective sterilization, rectocele.  2. Multiple facial nevi, approximately 20.   PROCEDURE:  1. Laparoscopic tubal sterilization, Falope ring application.  2. Posterior perineorrhaphy (posterior repair).  3. Excision of multiple facial nevi (20).   SURGEON:  Christin Bach, M.D.   ASSISTANTCurley Spice CST-FA   ANESTHESIA:  General   COMPLICATIONS:  None.   ESTIMATED BLOOD LOSS:  Minimal.   FINDINGS:  Lax pelvic structures.   DETAILS OF PROCEDURE:  The patient was taken to the operating room, prepped  and  draped for first the facial procedures.  The patient had multiple  pigmented skin tags that were dark in color and character on the face.  These were predominately on the left side with approximately 12-14 taken off  the left side and a lesser number on the right side for a total count of 20.  These ranged in size from 2-5 mm in diameter.  These were grasped, elevated  under slight traction, and shave excision performed using small Metzenbaum  scissors with good smooth tissue surface result and good cosmetic  appearance.  We then proceeded with the tubal sterilization.   The abdomen was prepped and draped with legs in low lithotomy leg support.  The speculum inserted and the cervix grasped and the uterus manipulated with  a Hulka tenaculum.  In-and-out bladder catheterization was performed.  We  then proceeded with placement of an infraumbilical, vertical, 1-cm skin  incision, as well as, a transverse suprapubic incision. The Veress needle  was used to introduce pneumoperitoneum through the umbilicus and we then  insufflated the abdomen with 3 liters of CO2.  Laparoscopic trocar was  introduced under direct visualization using camera without difficulty and  the suprapubic trocar introduced directly with visualization throughout.   Inspection of the pelvis revealed normal anatomy without evidence of  adhesions or infection.  There was no evidence of bowel injury.  We then  proceed with grasping each fallopian tube in sequence, at its portion,  elevating, and placing a mid-segment knuckle of tube inside the Falope ring  applier and firing the ring placement.  The incarcerated knuckle of tube was  then infiltrated with Marcaine solution percutaneously and the broad  ligament infiltrated as well just below the Falope ring.  A similar  procedure was then performed on the opposite tube with satisfactory  approximation and results.  Deflation of the abdomen was followed by removal  of laparoscopic equipment and subcuticular 4-0 Dexon closure of the skin  incisions.   POSTERIOR REPAIR:  Posterior repair was then performed by raising the legs  into a high lithotomy position with the yellow fin adjustable leg supports  and then prepping the perineum once further with a vaginal bib in place.  We  then split the posterior vaginal mucosa at the hymen remnants, undermined  the mucosa off the underlying connective tissue, and then placed double  glove index finger in the rectum for bilateral identification of the  perirectal supportive tissue that represents the remnants of the fascia in  the area of the perineal body.  Allis clamps were used to grasp adequate  tissue on either side that could be pulled together in the midline and the  perineum was rebuilt side-to-side by reaching down and pulling some of the  perineum upward to result in improved perineal support  as well as firmer  tissue in the rectovaginal septum.  Surgical results were quite good for a  distance of approximately 7 cm up the posterior vagina.  Redundant vaginal  tissues were trimmed and then tissue edges reapproximated with interrupted 2-  0 chromic.   The patient tolerated the multiple procedures well and went to the recovery  room in good condition.                                               Tilda Burrow, M.D.    JVF/MEDQ  D:  05/02/2003  T:  05/03/2003  Job:  [id number]

## 2011-01-02 NOTE — H&P (Signed)
NAME:  Brenda Avery, Brenda Avery                           ACCOUNT NO.:  [id number]   MEDICAL RECORD NO.:  [id number]                   PATIENT TYPE:  AMB   LOCATION:  DAY                                  FACILITY:  [facility]   PHYSICIAN:  Tilda Burrow, M.D.              DATE OF BIRTH:  April 26, 1971   DATE OF ADMISSION:  DATE OF DISCHARGE:                                HISTORY & PHYSICAL   ADMITTING DIAGNOSES:  1. Desire for elective permanent sterilization.  2. Symptomatic rectocele.  3. Multiple facial nevi warranting excision while asleep.   HISTORY OF PRESENT ILLNESS:  This 40 year old African-American female,  gravida 2, para 2, recently status post her second vaginal delivery. She is  admitted, at this time for elective sterilization.  The patient has been  seen postpartum and has been confirmed as having a desire for permanent  sterilization.  She has had appropriate counseling and signed permanent  sterilization request.  Postpartum course is complicated by uterine  sensitivity and treated with Zithromax for possible Chlamydia or mild  endometritis.  Chlamydia test came back negative and the patient responded  to antibiotics and time.  Upon return visit, at this time, she is brought  back for evaluation for proceeding to surgical correction of the rectocele  at the time of permanent sterilization.  She has a very lax introitus status  post postpartum deliveries.   Additionally we have had several dark pedunculated skin tags on her face  with at least 5 lesions on the right side of her face, one beneath the left  eye and several over the cheek surface.  Plans are to excise these at time  of the surgical procedure.   PAST MEDICAL HISTORY:  Hematuria with negative workup.   PAST SURGICAL HISTORY:  Negative.   ALLERGIES:  SULFA and METRONIDAZOLE.   SOCIAL HISTORY:  Married x5 years.  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   PHYSICAL EXAMINATION:  GENERAL:  Thin.  Shows a  healthy appearing African-  American female.  VITAL SIGNS:  Height 5 feet 3 inches.  Weight 177.  Blood pressure 120/70.  HEENT:  Pupils are equal, round, and reactive.  Extraocular movements  intact.  The previously mentioned multiple nevi, dark nevi over the cheek  and face are noted.  NECK:  Supple.  Trachea midline.  CHEST:  Clear to auscultation.  CARDIOVASCULAR:  Unremarkable.  ABDOMEN:  Obese, masses.  PELVIC:  Genitalia lax introitus with rectocele present.  Cervix  multiparous.  Last Pap smear February 2004, Class 1.  Uterus anterior  mobile, involuted.  Normal postpartum evaluation.   PLAN:  Laparoscopic tubal sterilization with Falope rings with concurrent  posterior repair with excision of skin nevi on April 19, 2003.  Tilda Burrow, M.D.    JVF/MEDQ  D:  04/17/2003  T:  04/17/2003  Job:  [id number]

## 2011-02-20 ENCOUNTER — Telehealth: Payer: Self-pay | Admitting: Family Medicine

## 2011-02-20 NOTE — Telephone Encounter (Signed)
Patient aware.

## 2012-07-21 ENCOUNTER — Other Ambulatory Visit (HOSPITAL_COMMUNITY): Payer: Self-pay | Admitting: Nurse Practitioner

## 2012-08-03 ENCOUNTER — Encounter (HOSPITAL_COMMUNITY): Payer: Self-pay

## 2012-08-04 ENCOUNTER — Other Ambulatory Visit (HOSPITAL_COMMUNITY): Payer: Self-pay | Admitting: Nurse Practitioner

## 2012-08-04 ENCOUNTER — Ambulatory Visit (HOSPITAL_COMMUNITY)
Admission: RE | Admit: 2012-08-04 | Discharge: 2012-08-04 | Disposition: A | Discharge status: Home / Self Care | Payer: Medicaid Other | Source: Ambulatory Visit | Attending: Nurse Practitioner | Admitting: Nurse Practitioner

## 2012-08-04 ENCOUNTER — Other Ambulatory Visit: Payer: Self-pay | Admitting: Nurse Practitioner

## 2012-08-04 DIAGNOSIS — N6320 Unspecified lump in the left breast, unspecified quadrant: Secondary | ICD-10-CM

## 2012-08-04 DIAGNOSIS — N63 Unspecified lump in unspecified breast: Secondary | ICD-10-CM | POA: Insufficient documentation

## 2012-08-04 DIAGNOSIS — R921 Mammographic calcification found on diagnostic imaging of breast: Secondary | ICD-10-CM

## 2012-08-17 HISTORY — PX: BREAST BIOPSY: SHX20

## 2012-08-19 ENCOUNTER — Ambulatory Visit
Admission: RE | Admit: 2012-08-19 | Discharge: 2012-08-19 | Disposition: A | Discharge status: Home / Self Care | Payer: PRIVATE HEALTH INSURANCE | Source: Ambulatory Visit | Attending: Nurse Practitioner | Admitting: Nurse Practitioner

## 2012-08-19 ENCOUNTER — Ambulatory Visit
Admission: RE | Admit: 2012-08-19 | Discharge: 2012-08-19 | Disposition: A | Discharge status: Home / Self Care | Payer: No Typology Code available for payment source | Source: Ambulatory Visit | Attending: Nurse Practitioner | Admitting: Nurse Practitioner

## 2012-08-19 ENCOUNTER — Ambulatory Visit
Admission: RE | Admit: 2012-08-19 | Discharge: 2012-08-19 | Disposition: A | Discharge status: Home / Self Care | Payer: No Typology Code available for payment source | Source: Ambulatory Visit

## 2012-08-19 ENCOUNTER — Other Ambulatory Visit: Payer: Self-pay | Admitting: Nurse Practitioner

## 2012-08-19 DIAGNOSIS — N6320 Unspecified lump in the left breast, unspecified quadrant: Secondary | ICD-10-CM

## 2012-08-19 DIAGNOSIS — R921 Mammographic calcification found on diagnostic imaging of breast: Secondary | ICD-10-CM

## 2012-08-23 ENCOUNTER — Other Ambulatory Visit: Payer: Self-pay | Admitting: Nurse Practitioner

## 2012-08-23 DIAGNOSIS — N6320 Unspecified lump in the left breast, unspecified quadrant: Secondary | ICD-10-CM

## 2012-09-06 ENCOUNTER — Encounter (HOSPITAL_COMMUNITY): Payer: Self-pay | Admitting: Oncology

## 2012-09-06 ENCOUNTER — Encounter (HOSPITAL_COMMUNITY): Payer: Medicaid Other | Attending: Oncology | Admitting: Oncology

## 2012-09-06 VITALS — BP 151/79 | HR 115 | Temp 99.0°F | Resp 16 | Ht 63.5 in | Wt 188.6 lb

## 2012-09-06 DIAGNOSIS — C50419 Malignant neoplasm of upper-outer quadrant of unspecified female breast: Secondary | ICD-10-CM

## 2012-09-06 DIAGNOSIS — Z803 Family history of malignant neoplasm of breast: Secondary | ICD-10-CM

## 2012-09-06 DIAGNOSIS — C50919 Malignant neoplasm of unspecified site of unspecified female breast: Secondary | ICD-10-CM | POA: Insufficient documentation

## 2012-09-06 DIAGNOSIS — Z170 Estrogen receptor positive status [ER+]: Secondary | ICD-10-CM

## 2012-09-06 LAB — COMPREHENSIVE METABOLIC PANEL
ALT: 20 U/L (ref 0–35)
Alkaline Phosphatase: 102 U/L (ref 39–117)
CO2: 22 mEq/L (ref 19–32)
Calcium: 9.3 mg/dL (ref 8.4–10.5)
Chloride: 105 mEq/L (ref 96–112)
GFR calc Af Amer: >90 mL/min (ref >90)
GFR calc non Af Amer: >90 mL/min (ref >90)
Glucose, Bld: 104 mg/dL — ABNORMAL HIGH (ref 70–99)
Potassium: 3.6 mEq/L (ref 3.5–5.1)
Sodium: 138 mEq/L (ref 135–145)
Total Bilirubin: 0.4 mg/dL (ref 0.3–1.2)

## 2012-09-06 LAB — CBC WITH DIFFERENTIAL/PLATELET
Eosinophils Relative: 3 % (ref 0–5)
Lymphocytes Relative: 23 % (ref 12–46)
Lymphs Abs: 0.8 10*3/uL (ref 0.7–4.0)
MCV: 78.8 fL (ref 78.0–100.0)
Platelets: 292 10*3/uL (ref 150–400)
RBC: 4.82 MIL/uL (ref 3.87–5.11)
WBC: 3.6 10*3/uL — ABNORMAL LOW (ref 4.0–10.5)

## 2012-09-06 NOTE — Progress Notes (Signed)
Brenda Avery presented for [employer]. Labs per MD order drawn via Peripheral Line 25 gauge needle inserted in rt ac  Good blood return present. Procedure without incident.  Needle removed intact. Patient tolerated procedure well.

## 2012-09-06 NOTE — Progress Notes (Signed)
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 She has biopsy proven invasive disease. She also has biopsy-proven DCIS. Clinically however she has a mass that is 7 x 5 cm in the upper outer quadrant of the left breast. This very pleasant 42 year old African American young Avery noticed in November some shooting pains in the left breast upper-outer quadrant. She went to have this evaluated. He was sent for mammography, then had ultrasonography and eventually biopsy. This mass is approximately 5 cm at least by one of those modalities. It was biopsied and a second area was biopsied. The mass more distant from the nipple areola complex turned out to be invasive ductal carcinoma. It is ER +100%, PR +30%, Ki-67 marker 34%, HER-2/neu nonamplified. She has not had any staging studies. No lymphadenopathy however is noted on her mammogram or ultrasound that is mentioned.  She breast-fed both of her children. She has a maternal aunt died of breast cancer but she does not know the age. She has a double first cousin recently diagnosed with breast cancer age 27 approximately. On her father's side she has 2 other aunts who died of breast cancer, age is unknown. She will try to find out this information. She has a normal performance status. One of her 8 siblings is deceased from COPD. Both parents are deceased from heart attacks. Her mother specifically did not have breast cancer that she is aware of. Her mother was 60 years old.  Oncology review of systems otherwise is noncontributory.  BP 151/79  Pulse 115  Temp 99 F (37.2 C) (Oral)  Resp 16  Ht 5' 3.5" (1.613 m)  Wt 188 lb 9.6 oz (85.548 kg)  BMI 32.88 kg/m2  She is in no acute distress. She is alert and oriented and accompanied by her best friend. She has no lymphadenopathy in the cervical, supraclavicular, infraclavicular, axillary, or inguinal areas. Her lungs are  clear to auscultation and percussion. She has no thyromegaly. Teeth are in excellent repair. Tongue is normal and in the midline. Facial symmetry is intact. Pupils are equally round and reactive to light. She has no arm or leg edema. She is right handed. Heart shows a regular rhythm and rate without murmur rub or gallop. Abdomen is soft and nontender without hepatosplenomegaly. Breast exam on the right is negative for any masses other is a sense of fibroglandular changes in the upper-outer quadrant of the right breast. In the left breast upper outer quadrant there is a 7 x 5 cm mass, sounds possibly slightly larger very firm and slightly irregular in size. I do not feel a second mass personally. There is no dimpling of the skin nor is there any fixation of this mass to the chest wall.  With her young age and her family history I think she is genetics counseling before definitive surgery. She would like a second opinion in [place]. With the size of this tumor being at least greater than 5 cm both clinically and by radiographic interpretation I think she is a candidate for staging PET scan. We will schedule that, do some basic blood work, and set her up with a genetics counseling consultation and a second opinion surgically  She knows that one may consider bilateral mastectomies issues BRCA1 or BRCA2 positive. She may also be a candidate for preoperative neoadjuvant chemotherapy. I wouldn't be concerned about how effective this would be since it is strongly estrogen receptor positive and  moderately progesterone receptor positive however. This will be discussed with her surgeon.

## 2012-09-06 NOTE — Patient Instructions (Signed)
.  [facility] Discharge Instructions  RECOMMENDATIONS MADE BY THE CONSULTANT AND ANY TEST RESULTS WILL BE SENT TO YOUR REFERRING PHYSICIAN.  EXAM FINDINGS BY THE PHYSICIAN TODAY AND SIGNS OR SYMPTOMS TO REPORT TO CLINIC OR PRIMARY PHYSICIAN:  DCIS  100% estrogen positive and 0% progesterone positive Invasive carcinoma 100% positive and 30% progesterone positive and it was Her 2 negative Proliferation marker 34% --intermediate grade --- probably grade 1 or 2    SPECIAL INSTRUCTIONS/FOLLOW-UP: We will get you to see genetics Counsellor, surgeon, and pet scan at [facility].  Thank you for choosing [facility] to provide your oncology and hematology care.  To afford each patient quality time with our providers, please arrive at least 15 minutes before your scheduled appointment time.  With your help, our goal is to use those 15 minutes to complete the necessary work-up to ensure our physicians have the information they need to help with your evaluation and healthcare recommendations.    Effective January 1st, 2014, we ask that you re-schedule your appointment with our physicians should you arrive 10 or more minutes late for your appointment.  We strive to give you quality time with our providers, and arriving late affects you and other patients whose appointments are after yours.    Again, thank you for choosing [facility].  Our hope is that these requests will decrease the amount of time that you wait before being seen by our physicians.       _____________________________________________________________  Should you have questions after your visit to [facility], please contact our office at [phone] between the hours of 8:30 a.m. and 5:00 p.m.  Voicemails left after 4:30 p.m. will not be returned until the following business day.  For prescription refill requests, have your pharmacy contact our office with your prescription  refill request.

## 2012-09-07 ENCOUNTER — Telehealth: Payer: Self-pay | Admitting: *Deleted

## 2012-09-07 NOTE — Telephone Encounter (Signed)
Left message for pt to return my call so I can schedule a genetic appt.  

## 2012-09-08 ENCOUNTER — Telehealth (INDEPENDENT_AMBULATORY_CARE_PROVIDER_SITE_OTHER): Payer: Self-pay | Admitting: General Surgery

## 2012-09-08 ENCOUNTER — Telehealth (HOSPITAL_COMMUNITY): Payer: Self-pay | Admitting: *Deleted

## 2012-09-08 ENCOUNTER — Telehealth: Payer: Self-pay | Admitting: *Deleted

## 2012-09-08 NOTE — Telephone Encounter (Signed)
Brenda Avery has appt with Dr. Derrell Lolling on 1/31.  She has received a message from Maylon Cos and will call her back for appt.  She states that she is very claustrophobic and wondered if she needs to take anything to relax her before the pet scan.

## 2012-09-08 NOTE — Telephone Encounter (Signed)
Pt returned my call and I confirmed 09/16/12 genetic appt w/ pt.  Emailed Clydie Braun to make aware.

## 2012-09-08 NOTE — Telephone Encounter (Signed)
Called patient and advised of appointment to see Dr. Derrell Lolling on 09/16/12 at 11:45. Confirmed location of our office for the patient and the other two appointments she has in relation to care by Dr. Mariel Sleet.

## 2012-09-14 ENCOUNTER — Encounter (HOSPITAL_COMMUNITY)
Admission: RE | Admit: 2012-09-14 | Discharge: 2012-09-14 | Disposition: A | Discharge status: Home / Self Care | Payer: Medicaid Other | Source: Ambulatory Visit | Attending: Oncology | Admitting: Oncology

## 2012-09-14 ENCOUNTER — Telehealth (HOSPITAL_COMMUNITY): Payer: Self-pay | Admitting: *Deleted

## 2012-09-14 ENCOUNTER — Ambulatory Visit (HOSPITAL_COMMUNITY): Payer: Self-pay | Admitting: Oncology

## 2012-09-14 ENCOUNTER — Encounter (HOSPITAL_COMMUNITY): Payer: Self-pay

## 2012-09-14 DIAGNOSIS — C50919 Malignant neoplasm of unspecified site of unspecified female breast: Secondary | ICD-10-CM | POA: Insufficient documentation

## 2012-09-14 DIAGNOSIS — J90 Pleural effusion, not elsewhere classified: Secondary | ICD-10-CM | POA: Insufficient documentation

## 2012-09-14 DIAGNOSIS — M8530 Osteitis condensans, unspecified site: Secondary | ICD-10-CM | POA: Insufficient documentation

## 2012-09-14 DIAGNOSIS — C774 Secondary and unspecified malignant neoplasm of inguinal and lower limb lymph nodes: Secondary | ICD-10-CM | POA: Insufficient documentation

## 2012-09-14 MED ORDER — FLUDEOXYGLUCOSE F - 18 (FDG) INJECTION
18.3000 | Freq: Once | INTRAVENOUS | Status: AC | PRN
  Administered 2012-09-14: 18.3 via INTRAVENOUS

## 2012-09-14 NOTE — Telephone Encounter (Signed)
Patient called to see she would need more xanax. She had #20 called in prior to pet and said she did not need anymore. Patient sounded tearful

## 2012-09-16 ENCOUNTER — Encounter (HOSPITAL_COMMUNITY): Payer: Self-pay | Admitting: Pharmacist

## 2012-09-16 ENCOUNTER — Encounter (HOSPITAL_BASED_OUTPATIENT_CLINIC_OR_DEPARTMENT_OTHER): Payer: Medicaid Other | Admitting: Oncology

## 2012-09-16 ENCOUNTER — Ambulatory Visit (INDEPENDENT_AMBULATORY_CARE_PROVIDER_SITE_OTHER): Payer: Self-pay | Admitting: General Surgery

## 2012-09-16 ENCOUNTER — Encounter (INDEPENDENT_AMBULATORY_CARE_PROVIDER_SITE_OTHER): Payer: Self-pay | Admitting: General Surgery

## 2012-09-16 ENCOUNTER — Other Ambulatory Visit: Payer: Self-pay | Admitting: Lab

## 2012-09-16 ENCOUNTER — Ambulatory Visit (HOSPITAL_BASED_OUTPATIENT_CLINIC_OR_DEPARTMENT_OTHER): Payer: Medicaid Other | Admitting: Genetic Counselor

## 2012-09-16 VITALS — BP 130/90 | HR 80 | Temp 98.1°F | Resp 16 | Ht 64.5 in | Wt 187.0 lb

## 2012-09-16 DIAGNOSIS — C778 Secondary and unspecified malignant neoplasm of lymph nodes of multiple regions: Secondary | ICD-10-CM

## 2012-09-16 DIAGNOSIS — C787 Secondary malignant neoplasm of liver and intrahepatic bile duct: Secondary | ICD-10-CM

## 2012-09-16 DIAGNOSIS — Z8049 Family history of malignant neoplasm of other genital organs: Secondary | ICD-10-CM

## 2012-09-16 DIAGNOSIS — IMO0002 Reserved for concepts with insufficient information to code with codable children: Secondary | ICD-10-CM

## 2012-09-16 DIAGNOSIS — C7952 Secondary malignant neoplasm of bone marrow: Secondary | ICD-10-CM

## 2012-09-16 DIAGNOSIS — C50419 Malignant neoplasm of upper-outer quadrant of unspecified female breast: Secondary | ICD-10-CM

## 2012-09-16 DIAGNOSIS — C50919 Malignant neoplasm of unspecified site of unspecified female breast: Secondary | ICD-10-CM

## 2012-09-16 DIAGNOSIS — C50912 Malignant neoplasm of unspecified site of left female breast: Secondary | ICD-10-CM | POA: Insufficient documentation

## 2012-09-16 DIAGNOSIS — Z803 Family history of malignant neoplasm of breast: Secondary | ICD-10-CM

## 2012-09-16 NOTE — Progress Notes (Signed)
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 She does have a family history of a first cousin with breast cancer actively being treated in her 47s, 2 maternal aunts with breast cancer, and one paternal aunt who died of breast cancer at age 44. She therefore clearly needs genetic testing. She has multiple siblings and 2 young children of her own. She is accompanied today by her husband and her best friend who is also her minister. I discussed with her the laboratory work and her PET scan. I went over in detail the images a PET scan and we spoke for more than 35 minutes on the indications of stage IV disease. Surgery will be postponed on her left breast therefore and we will focus on treating her metastatic disease. I do think a liver biopsy is indicated to make sure this is not HER-2 positive disease in her liver. She does have a large mass in the left breast which may have multiple clones.  She is to keep her appointment today with the genetics counselor and with Dr. Derrell Lolling for port placement. I have discussed her case with Dr Fredia Sorrow in interventional radiology who will do the liver biopsy Monday a.m. Her Port-A-Cath will be placed soon as well by Dr. Derrell Lolling.  She knows that we will attempt to give her chemotherapy for 6 cycles first unless her HER-2/neu was positive. She will then be treated with hormonal therapy plus or minus any radiation therapy areas of concern but presently she has no bone pain whatsoever, no abdominal pain whatsoever, etc.  She did inquire about her last mammogram in 2011 but that was said to be suspicious for a possible mass but more detailed evaluation including ultrasound was said to show a simple cyst.   We will therefore proceed with a liver biopsy, Port-A-Cath placement and start therapy as soon as practical.

## 2012-09-16 NOTE — Progress Notes (Addendum)
Patient ID: Brenda Avery, female   DOB: [birth year], 42 y.o.   MRN: [id number]  Chief Complaint  Patient presents with  . Breast Cancer    eval lt breast    HPI Brenda Avery is a 42 y.o. female.  She is referred by Dr. Glenford Peers for evaluation and management of a large cancer in the upper outer quadrant of the left breast which is also probably metastatic to multiple sites. Dr. [name] is her primary care physician.  The patient is here with her husband and sister. Her health is good. She has no prior breast problems. She developed pain in her left breast and felt a mass. Mammograms were performed showing a 5 cm and a second 2.8 cm mass in the left breast at the 2:00 position. There were suspicious calcifications anterior and inferior to the mass. The left axilla was negative radiographically. There was a cyst in the left breast which was aspirated. 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 HER-2 negative.  Subsequent PET scan is positive with uptake in the left supraclavicular node, the sternum, pulmonary hila, mediastinum, liver, inguinal and iliac nodes.  Dr. Glenford Peers has seen her twice and has recommended Port-A-Cath placement, liver biopsy, genetic counseling, chemotherapy, and  hormonal therapy  after chemotherapy is completed. Hopefully she will become a candidate for left breast surgery for local control at some point in the future.  Family history is negative for breast cancer in any first line relatives. She has a first cousin recently diagnosed with breast cancer at age 58 and 2 paternal aunts who died of breast cancer.  She went for genetic counseling today and had blood work drawn. HPI  Past Medical History  Diagnosis Date  . Diabetes     diet therapy   . Hyperlipidemia     diet therapy per patient request  . 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 Hematuria  2000/2001    evaluted in [place] with negative results  . Breast CA     left    Past Surgical History  Procedure Date  . Tubal ligation 2004  . Rectocele repair 2004  . Cystoscopy 2001  . Excision of facial nevi 2004  . Breast biopsy 2014    Family History  Problem Relation Age of Onset  . Hypertension Mother   . Diabetes Mother     MI  . Heart disease Mother   . Hypertension Sister     x4 only one HTN and one thats DI  . Hypertension Brother     x4 only 1 htn & dm  . Heart disease Father   . Cancer Maternal Aunt     breast    Social History History  Substance Use Topics  . Smoking status: Never Smoker   . Smokeless tobacco: Never Used  . Alcohol Use: No    Allergies  Allergen Reactions  . Metronidazole   . Sulfonamide Derivatives     REACTION: rash    Current Outpatient Prescriptions  Medication Sig Dispense Refill  . acetaminophen (TYLENOL) 500 MG tablet Take 500 mg by mouth every 6 (six) hours as needed.      . ALPRAZolam (XANAX) 0.5 MG tablet Take 0.5 mg by mouth 2 (two) times daily as needed. Pt to take 1-2 tablets to 60 min before PET SCAN. #20 0 refills  Review of Systems Review of Systems  Constitutional: Negative for fever, chills and unexpected weight change.  HENT: Negative for hearing loss, congestion, sore throat, trouble swallowing and voice change.   Eyes: Negative for visual disturbance.  Respiratory: Negative for cough and wheezing.   Cardiovascular: Negative for chest pain, palpitations and leg swelling.  Gastrointestinal: Negative for nausea, vomiting, abdominal pain, diarrhea, constipation, blood in stool, abdominal distention and anal bleeding.  Genitourinary: Negative for hematuria, vaginal bleeding and difficulty urinating.  Musculoskeletal: Negative for arthralgias.  Skin: Negative for rash and wound.  Neurological: Negative for seizures, syncope and headaches.  Hematological: Negative for adenopathy. Does not  bruise/bleed easily.  Psychiatric/Behavioral: Negative for confusion.    Blood pressure 130/90, pulse 80, temperature 98.1 F (36.7 C), temperature source Temporal, resp. rate 16, height 5' 4.5" (1.638 m), weight 187 lb (84.823 kg), last menstrual period 09/13/2012.  Physical Exam Physical Exam  Constitutional: She is oriented to person, place, and time. She appears well-developed and well-nourished. No distress.       Healthy appearing African American female.  HENT:  Head: Normocephalic and atraumatic.  Nose: Nose normal.  Mouth/Throat: No oropharyngeal exudate.  Eyes: Conjunctivae normal and EOM are normal. Pupils are equal, round, and reactive to light. Left eye exhibits no discharge. No scleral icterus.  Neck: Neck supple. No JVD present. No tracheal deviation present. No thyromegaly present.       I do not feel any supraclavicular adenopathy.  Cardiovascular: Normal rate, regular rhythm, normal heart sounds and intact distal pulses.   No murmur heard. Pulmonary/Chest: Effort normal and breath sounds normal. No respiratory distress. She has no wheezes. She has no rales. She exhibits no tenderness.       There is a large, mobile mass in the upper outer quadrant of the left breast. 7 or 8 cm diameter. This is mobile. Not fixed to the chest wall. Not fixed to the skin. There are no other masses in either breast. There is no axillary adenopathy. Breasts are large.  Abdominal: Soft. Bowel sounds are normal. She exhibits no distension and no mass. There is no tenderness. There is no rebound and no guarding.       Scar at umbilicus from tubal ligation  Musculoskeletal: She exhibits no edema and no tenderness.  Lymphadenopathy:    She has no cervical adenopathy.  Neurological: She is alert and oriented to person, place, and time. She exhibits normal muscle tone. Coordination normal.  Skin: Skin is warm. No rash noted. She is not diaphoretic. No erythema. No pallor.  Psychiatric: She has a  normal mood and affect. Her behavior is normal. Judgment and thought content normal.    Data Reviewed Phone conversation with Dr. Mariel Sleet. All imaging studies. All pathology reports. Dr. Thornton Papas notes.  Assessment    Invasive carcinoma left breast, upper outer quadrant, receptor positive, HER-2-negative, T3 N0 clinically.  Breast cancer metastatic to multiple sites by imaging studies. Liver biopsy pending  Otherwise healthy  Addendum: 10/12/2011: Bilateral breast MRI performed on 10/10/2012.    This shows extensive cancer left breast in the left upper outer quadrant and the left lower outer quadrant and question of axillary lymph node involvement. Also a question of chest wall involvement.    There are also 2 right breast masses. If breast conservation is desired, radiology advises ultrasound-guided biopsy of the right breast masses. I called Dr. Mariel Sleet today, February 25. He has discussed the MRI with the patient. It appears that she may have much  more extensive disease than we thought, and she may ultimately require bilateral mastectomies. Dr. Mariel Sleet states that her genetic testing has not been done yet because of financial reasons, but he still plans to do that. We agreed that I would call the patient in for a 30 minute appointment to discuss the MRI findings and issues of local control on both the left breast and the right breast before much time goes by.    Plan    I had a long discussion with the patient her family regarding surgical issues. We talked about Port-A-Cath placement to facilitate systemic therapy, which clearly is the first epidermic care. We talked about the possibility of breast surgery for local control at some point in the future, depending on how she is doing. I told her I would follow her during her chemotherapy  I told her that I agree with all Dr. Thornton Papas plans  She will be scheduled for Port-A-Cath insertion next week. I discussed the indications,  details, techniques, and numerous risks of the surgery with her. I drew diagrams and showed her example of a port. She understands all these issues. All of her questions are answered. She agrees with this plan.       Angelia Mould. Derrell Lolling, M.D., [facility] General and Minimally invasive Surgery Breast and Colorectal Surgery Office:   [phone] Pager:   [phone]  09/16/2012, 12:58 PM

## 2012-09-16 NOTE — Patient Instructions (Addendum)
[facility] Discharge Instructions  RECOMMENDATIONS MADE BY THE CONSULTANT AND ANY TEST RESULTS WILL BE SENT TO YOUR REFERRING PHYSICIAN.  Your PET scan has revealed that you have extensive metastatic disease from your breast cancer involving your liver, chest lymph nodes and some areas to your bones. You have Stage 4 Breast Cancer that is ER+, PR+, HER2-. Dr.Neijstrom is going to speak with Dr.Ingraham about the possibility of a liver biopsy which will give [place] more information about your cancer. Keep your appointment with your surgeon. You do not need breast surgery at this point but we need him to put a port a cath in for chemotherapy. Keep your appointment with the [facility], this information will be very important for your children. Our plan for you will be: 1. Liver biopsy 2. Port a cath placement. 3. Chemotherapy. 4. Anti-hormonal therapy in a pill form after chemotherapy is complete. We will have you come back prior to chemotherapy to talk with Rosezella Rumpf about your chemotherapy regimen and get you scheduled accordingly. Feel free to call anytime with any questions or concerns you may have.  Thank you for choosing [facility] to provide your oncology and hematology care.  To afford each patient quality time with our providers, please arrive at least 15 minutes before your scheduled appointment time.  With your help, our goal is to use those 15 minutes to complete the necessary work-up to ensure our physicians have the information they need to help with your evaluation and healthcare recommendations.    Effective January 1st, 2014, we ask that you re-schedule your appointment with our physicians should you arrive 10 or more minutes late for your appointment.  We strive to give you quality time with our providers, and arriving late affects you and other patients whose appointments are after yours.    Again, thank you for choosing [facility].  Our hope is that these requests will decrease the amount of time that you wait before being seen by our physicians.       _____________________________________________________________  Should you have questions after your visit to [facility], please contact our office at [phone] between the hours of 8:30 a.m. and 5:00 p.m.  Voicemails left after 4:30 p.m. will not be returned until the following business day.  For prescription refill requests, have your pharmacy contact our office with your prescription refill request.

## 2012-09-16 NOTE — Patient Instructions (Signed)
You have been found to have a large cancer in the upper outer quadrant of the left breast.  Dr. Mariel Sleet has done other x-ray studies which suggested that you may have cancer spread to other parts of your body.  You will be scheduled for insertion of Port-A-Cath by Dr. Derrell Lolling next week.      Implanted Port Instructions An implanted port is a central line that has a round shape and is placed under the skin. It is used for long-term IV (intravenous) access for:  Medicine.  Fluids.  Liquid nutrition, such as TPN (total parenteral nutrition).  Blood samples. Ports can be placed:  In the chest area just below the collarbone (this is the most common place.)  In the arms.  In the belly (abdomen) area.  In the legs. PARTS OF THE PORT A port has 2 main parts:  The reservoir. The reservoir is round, disc-shaped, and will be a small, raised area under your skin.  The reservoir is the part where a needle is inserted (accessed) to either give medicines or to draw blood.  The catheter. The catheter is a long, slender tube that extends from the reservoir. The catheter is placed into a large vein.  Medicine that is inserted into the reservoir goes into the catheter and then into the vein. INSERTION OF THE PORT  The port is surgically placed in either an operating room or in a procedural area (interventional radiology).  Medicine may be given to help you relax during the procedure.  The skin where the port will be inserted is numbed (local anesthetic).  1 or 2 small cuts (incisions) will be made in the skin to insert the port.  The port can be used after it has been inserted. INCISION SITE CARE  The incision site may have small adhesive strips on it. This helps keep the incision site closed. Sometimes, no adhesive strips are placed. Instead of adhesive strips, a special kind of surgical glue is used to keep the incision closed.  If adhesive strips were placed on the incision  sites, do not take them off. They will fall off on their own.  The incision site may be sore for 1 to 2 days. Pain medicine can help.  Do not get the incision site wet. Bathe or shower as directed by your caregiver.  The incision site should heal in 5 to 7 days. A small scar may form after the incision has healed. ACCESSING THE PORT Special steps must be taken to access the port:  Before the port is accessed, a numbing cream can be placed on the skin. This helps numb the skin over the port site.  A sterile technique is used to access the port.  The port is accessed with a needle. Only "non-coring" port needles should be used to access the port. Once the port is accessed, a blood return should be checked. This helps ensure the port is in the vein and is not clogged (clotted).  If your caregiver believes your port should remain accessed, a clear (transparent) bandage will be placed over the needle site. The bandage and needle will need to be changed every week or as directed by your caregiver.  Keep the bandage covering the needle clean and dry. Do not get it wet. Follow your caregiver's instructions on how to take a shower or bath when the port is accessed.  If your port does not need to stay accessed, no bandage is needed over the port. FLUSHING THE  PORT Flushing the port keeps it from getting clogged. How often the port is flushed depends on:  If a constant infusion is running. If a constant infusion is running, the port may not need to be flushed.  If intermittent medicines are given.  If the port is not being used. For intermittent medicines:  The port will need to be flushed:  After medicines have been given.  After blood has been drawn.  As part of routine maintenance.  A port is normally flushed with:  Normal saline.  Heparin.  Follow your caregiver's advice on how often, how much, and the type of flush to use on your port. IMPORTANT PORT INFORMATION  Tell your  caregiver if you are allergic to heparin.  After your port is placed, you will get a manufacturer's information card. The card has information about your port. Keep this card with you at all times.  There are many types of ports available. Know what kind of port you have.  In case of an emergency, it may be helpful to wear a medical alert bracelet. This can help alert health care workers that you have a port.  The port can stay in for as long as your caregiver believes it is necessary.  When it is time for the port to come out, surgery will be done to remove it. The surgery will be similar to how the port was put in.  If you are in the hospital or clinic:  Your port will be taken care of and flushed by a nurse.  If you are at home:  A home health care nurse may give medicines and take care of the port.  You or a family member can get special training and directions for giving medicine and taking care of the port at home. SEEK IMMEDIATE MEDICAL CARE IF:   Your port does not flush or you are unable to get a blood return.  New drainage or pus is coming from the incision.  A bad smell is coming from the incision site.  You develop swelling or increased redness at the incision site.  You develop increased swelling or pain at the port site.  You develop swelling or pain in the surrounding skin near the port.  You have an oral temperature above 102 F (38.9 C), not controlled by medicine. MAKE SURE YOU:   Understand these instructions.  Will watch your condition.  Will get help right away if you are not doing well or get worse. Document Released: 08/03/2005 Document Revised: 10/26/2011 Document Reviewed: 10/25/2008 [facility] Patient Information 2013 [place], [place].

## 2012-09-19 ENCOUNTER — Encounter (HOSPITAL_COMMUNITY): Payer: Self-pay | Admitting: Pharmacy Technician

## 2012-09-19 ENCOUNTER — Ambulatory Visit (HOSPITAL_COMMUNITY)
Admission: RE | Admit: 2012-09-19 | Discharge: 2012-09-19 | Disposition: A | Discharge status: Home / Self Care | Payer: Medicaid Other | Source: Ambulatory Visit | Attending: Oncology | Admitting: Oncology

## 2012-09-19 ENCOUNTER — Encounter (HOSPITAL_COMMUNITY): Payer: Self-pay | Admitting: *Deleted

## 2012-09-19 ENCOUNTER — Ambulatory Visit (HOSPITAL_COMMUNITY): Payer: Self-pay | Admitting: Oncology

## 2012-09-19 ENCOUNTER — Encounter (HOSPITAL_COMMUNITY): Payer: Self-pay

## 2012-09-19 DIAGNOSIS — C787 Secondary malignant neoplasm of liver and intrahepatic bile duct: Secondary | ICD-10-CM

## 2012-09-19 LAB — CBC
Hemoglobin: 12.1 g/dL (ref 12.0–15.0)
MCHC: 33.2 g/dL (ref 30.0–36.0)
Platelets: 270 10*3/uL (ref 150–400)
RDW: 14 % (ref 11.5–15.5)

## 2012-09-19 LAB — PROTIME-INR: Prothrombin Time: 12.8 seconds (ref 11.6–15.2)

## 2012-09-19 LAB — APTT: aPTT: 32 seconds (ref 24–37)

## 2012-09-19 MED ORDER — FENTANYL CITRATE 0.05 MG/ML IJ SOLN
INTRAMUSCULAR | Status: AC | PRN
  Administered 2012-09-19 (×2): 100 ug via INTRAVENOUS

## 2012-09-19 MED ORDER — ONDANSETRON HCL 4 MG/2ML IJ SOLN
INTRAMUSCULAR | Status: AC
  Administered 2012-09-19: 4 mg via INTRAVENOUS
  Filled 2012-09-19: qty 2

## 2012-09-19 MED ORDER — MIDAZOLAM HCL 2 MG/2ML IJ SOLN
INTRAMUSCULAR | Status: AC
  Filled 2012-09-19: qty 4

## 2012-09-19 MED ORDER — ONDANSETRON HCL 4 MG/2ML IJ SOLN
4.0000 mg | Freq: Once | INTRAMUSCULAR | Status: AC
  Administered 2012-09-19: 4 mg via INTRAVENOUS
  Filled 2012-09-19: qty 2

## 2012-09-19 MED ORDER — MIDAZOLAM HCL 2 MG/2ML IJ SOLN
INTRAMUSCULAR | Status: AC | PRN
  Administered 2012-09-19: 2 mg via INTRAVENOUS

## 2012-09-19 MED ORDER — SODIUM CHLORIDE 0.9 % IV SOLN: INTRAVENOUS | Status: DC

## 2012-09-19 MED ORDER — FENTANYL CITRATE 0.05 MG/ML IJ SOLN
INTRAMUSCULAR | Status: AC
  Filled 2012-09-19: qty 4

## 2012-09-19 MED ORDER — HYDROCODONE-ACETAMINOPHEN 5-325 MG PO TABS
1.0000 | ORAL_TABLET | ORAL | Status: DC | PRN
  Filled 2012-09-19: qty 2

## 2012-09-19 MED ORDER — CHLORHEXIDINE GLUCONATE 4 % EX LIQD: 1.0000 "application " | Freq: Once | CUTANEOUS | Status: DC

## 2012-09-19 MED ORDER — CEFAZOLIN SODIUM-DEXTROSE 2-3 GM-% IV SOLR
2.0000 g | INTRAVENOUS | Status: AC
  Administered 2012-09-20: 2 g via INTRAVENOUS
  Filled 2012-09-19: qty 50

## 2012-09-19 NOTE — Procedures (Signed)
Procedure:  Ultrasound guided core biopsy of liver Findings:  Vague heterogenous liver echotexture in region of PET abnormalities.  18 G core bx x 3 via 17 G needle in left lobe of liver.

## 2012-09-19 NOTE — Progress Notes (Signed)
Recover post liver biopsy in short stay. Pt was drinking gingerale and c/o nausea. Called for meds for this and gave patient Zofran . After 15 minutes pt states she feels better and wants to try some crackers

## 2012-09-19 NOTE — H&P (Signed)
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   MRN: [id number]                  HPI Brenda Avery is a 42 y.o. female.  She is referred by Dr. Glenford Peers for evaluation and management of a large cancer in the upper outer quadrant of the left breast which is also probably metastatic to multiple sites. Dr. [name] is her primary care physician.   The patient is here with her husband and sister. Her health is good. She has no prior breast problems. She developed pain in her left breast and felt a mass. Mammograms were performed showing a 5 cm and a second 2.8 cm mass in the left breast at the 2:00 position. There were suspicious calcifications anterior and inferior to the mass. The left axilla was negative radiographically. There was a cyst in the left breast which was aspirated. 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 HER-2 negative.   Subsequent PET scan is positive with uptake in the left supraclavicular node, the sternum, pulmonary hila, mediastinum, liver, inguinal and iliac nodes.   Dr. Glenford Peers has seen her twice and has recommended Port-A-Cath placement, liver biopsy, genetic counseling, chemotherapy, and  hormonal therapy  after chemotherapy is completed. Hopefully she will become a candidate for left breast surgery for local control at some  point in the future.   Family history is negative for breast cancer in any first line relatives. She has a first cousin recently diagnosed with breast cancer at age 80 and 2 paternal aunts who died of breast cancer.   She went for genetic counseling today and had blood work drawn.       Past Medical History   Diagnosis  Date   .  Diabetes         diet therapy    .  Hyperlipidemia         diet therapy per patient request   .  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  Hematuria  2000/2001       evaluted in [place] with negative results   .  Breast CA         left       Past Surgical History   Procedure  Date   .  Tubal ligation  2004   .  Rectocele repair  2004   .  Cystoscopy  2001   .  Excision of facial nevi  2004   .  Breast biopsy  2014  Family History   Problem  Relation  Age of Onset   .  Hypertension  Mother     .  Diabetes  Mother         MI   .  Heart disease  Mother     .  Hypertension  Sister         x4 only one HTN and one thats DI   .  Hypertension  Brother         x4 only 1 htn & dm   .  Heart disease  Father     .  Cancer  Maternal Aunt         breast      Social History History   Substance Use Topics   .  Smoking status:  Never Smoker    .  Smokeless tobacco:  Never Used   .  Alcohol Use:  No       Allergies   Allergen  Reactions   .  Metronidazole     .  Sulfonamide Derivatives         REACTION: rash       Current Outpatient Prescriptions   Medication  Sig  Dispense  Refill   .  acetaminophen (TYLENOL) 500 MG tablet  Take 500 mg by mouth every 6 (six) hours as needed.         .  ALPRAZolam (XANAX) 0.5 MG tablet  Take 0.5 mg by mouth 2 (two) times daily as needed. Pt to take 1-2 tablets to 60 min before PET SCAN. #20 0 refills            Review of Systems   Constitutional: Negative for fever, chills and unexpected weight change.  HENT: Negative for hearing loss, congestion, sore throat, trouble  swallowing and voice change.   Eyes: Negative for visual disturbance.  Respiratory: Negative for cough and wheezing.   Cardiovascular: Negative for chest pain, palpitations and leg swelling.  Gastrointestinal: Negative for nausea, vomiting, abdominal pain, diarrhea, constipation, blood in stool, abdominal distention and anal bleeding.  Genitourinary: Negative for hematuria, vaginal bleeding and difficulty urinating.  Musculoskeletal: Negative for arthralgias.  Skin: Negative for rash and wound.  Neurological: Negative for seizures, syncope and headaches.  Hematological: Negative for adenopathy. Does not bruise/bleed easily.  Psychiatric/Behavioral: Negative for confusion.    Blood pressure 130/90, pulse 80, temperature 98.1 F (36.7 C), temperature source Temporal, resp. rate 16, height 5' 4.5" (1.638 m), weight 187 lb (84.823 kg), last menstrual period 09/13/2012.   Physical Exam  Constitutional: She is oriented to person, place, and time. She appears well-developed and well-nourished. No distress.       Healthy appearing African American female.  HENT:   Head: Normocephalic and atraumatic.   Nose: Nose normal.   Mouth/Throat: No oropharyngeal exudate.  Eyes: Conjunctivae normal and EOM are normal. Pupils are equal, round, and reactive to light. Left eye exhibits no discharge. No scleral icterus.  Neck: Neck supple. No JVD present. No tracheal deviation present. No thyromegaly present.       I do not feel any supraclavicular adenopathy.  Cardiovascular: Normal rate, regular rhythm, normal heart sounds and intact distal pulses.    No murmur heard. Pulmonary/Chest: Effort normal and breath sounds normal. No respiratory distress. She has no wheezes. She has no rales. She exhibits no tenderness.       There is a large, mobile mass in the upper outer quadrant of the left breast. 7 or 8  cm diameter. This is mobile. Not fixed to the chest wall. Not fixed to the skin. There are no other  masses in either breast. There is no axillary adenopathy. Breasts are large.  Abdominal: Soft. Bowel sounds are normal. She exhibits no distension and no mass. There is no tenderness. There is no rebound and no guarding.       Scar at umbilicus from tubal ligation  Musculoskeletal: She exhibits no edema and no tenderness.  Lymphadenopathy:    She has no cervical adenopathy.  Neurological: She is alert and oriented to person, place, and time. She exhibits normal muscle tone. Coordination normal.  Skin: Skin is warm. No rash noted. She is not diaphoretic. No erythema. No pallor.  Psychiatric: She has a normal mood and affect. Her behavior is normal. Judgment and thought content normal.    Data Reviewed Phone conversation with Dr. Mariel Sleet. All imaging studies. All pathology reports. Dr. Thornton Papas notes.   Assessment Invasive carcinoma left breast, upper outer quadrant, receptor positive, HER-2-negative, T3 N0 clinically.   Breast cancer metastatic to multiple sites by imaging studies. Liver biopsy pending   Otherwise healthy   Plan I had a long discussion with the patient her family regarding surgical issues. We talked about Port-A-Cath placement to facilitate systemic therapy, which clearly is the first epidermic care. We talked about the possibility of breast surgery for local control at some point in the future, depending on how she is doing. I told her I would follow her during her chemotherapy   I told her that I agree with all Dr. Thornton Papas plans   She will be scheduled for Port-A-Cath insertion next week. I discussed the indications, details, techniques, and numerous risks of the surgery with her. I drew diagrams and showed her example of a port. She understands all these issues. All of her questions are answered. She agrees with this plan.       Angelia Mould. Derrell Lolling, M.D., [facility] General and Minimally invasive Surgery Breast and  Colorectal Surgery Office:   [phone] Pager:   [phone]

## 2012-09-19 NOTE — H&P (Signed)
Agree.  For liver biopsy today. 

## 2012-09-19 NOTE — H&P (Signed)
Chief Complaint: "I'm here for a liver biopsy" Referring Physician:Neijstrom HPI: Brenda Avery is an 42 y.o. female with left breast cancer and is also found to have multiple hypermetabolic lesions on PET concerning for mets. She is referred for biopsy of one of these lesion, and is scheduled for US guided biopsy of liver lesion. Other than being a little nervous about procedure today, she is feeling well. PMHx and meds reviewed.  Past Medical History:  Past Medical History  Diagnosis Date  . Diabetes     diet therapy   . Hyperlipidemia     diet therapy per patient request  . 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 Hematuria 2000/2001    evaluted in [place] with negative results  . Breast CA     left    Past Surgical History:  Past Surgical History  Procedure Date  . Tubal ligation 2004  . Rectocele repair 2004  . Cystoscopy 2001  . Excision of facial nevi 2004  . Breast biopsy 2014    Family History:  Family History  Problem Relation Age of Onset  . Hypertension Mother   . Diabetes Mother     MI  . Heart disease Mother   . Hypertension Sister     x4 only one HTN and one thats DI  . Hypertension Brother     x4 only 1 htn & dm  . Heart disease Father   . Cancer Maternal Aunt     breast    Social History:  reports that she has never smoked. She has never used smokeless tobacco. She reports that she does not drink alcohol or use illicit drugs.  Allergies:  Allergies  Allergen Reactions  . Metronidazole Hives and Itching  . Sulfonamide Derivatives Hives, Itching and Rash    Medications: Current Outpatient Prescriptions   Medication  Sig  Dispense  Refill   .  acetaminophen (TYLENOL) 500 MG tablet  Take 500 mg by mouth every 6 (six) hours as needed.       Please HPI for pertinent positives, otherwise complete 10 system ROS negative.  Physical Exam: Last menstrual period 09/13/2012. Temp: 98.2, BP: 145/95, HR: 86, RR: 18, O2: 100%   General  Appearance:  Alert, cooperative, obese AA female, NAD   Head:  Normocephalic, without obvious abnormality, atraumatic  ENT: Unremarkable  Neck: Supple, symmetrical, trachea midline, no adenopathy, thyroid: not enlarged, symmetric, no tenderness/mass/nodules  Lungs:   Clear to auscultation bilaterally, no w/r/r, respirations unlabored without use of accessory muscles.  Heart:  Regular rate and rhythm, S1, S2 normal, no murmur, rub or gallop. Carotids 2+ without bruit.  Abdomen:   Soft, non-tender, non distended. Bowel sounds active all four quadrants,  no masses, no organomegaly.  Neurologic: Normal affect, no gross deficits.   Results for orders placed during the hospital encounter of 09/19/12 (from the past 48 hour(s))  CBC     Status: Abnormal   Collection Time   09/19/12 11:30 AM      Component Value Range Comment   WBC 3.3 (*) 4.0 - 10.5 K/uL    RBC 4.66  3.87 - 5.11 MIL/uL    Hemoglobin 12.1  12.0 - 15.0 g/dL    HCT 16.1  09.6 - 04.5 %    MCV 78.1  78.0 - 100.0 fL    MCH 26.0  26.0 - 34.0 pg    MCHC 33.2  30.0 - 36.0 g/dL    RDW  14.0  11.5 - 15.5 %    Platelets 270  150 - 400 K/uL   APTT     Status: Normal   Collection Time   09/19/12 11:30 AM      Component Value Range Comment   aPTT 32  24 - 37 seconds   PROTIME-INR     Status: Normal   Collection Time   09/19/12 11:30 AM      Component Value Range Comment   Prothrombin Time 12.8  11.6 - 15.2 seconds    INR 0.97  0.00 - 1.49    No results found.  Assessment/Plan (L)breast cancer with hypermetabolic liver lesions. For US guided core biopsy today Discussed procedure, including risks, complications, use of sedation. Labs reviewed, all ok. Consent signed in chart  Brayton El PA-C 09/19/2012, 12:25 PM

## 2012-09-20 ENCOUNTER — Encounter (HOSPITAL_COMMUNITY): Payer: Self-pay | Admitting: Anesthesiology

## 2012-09-20 ENCOUNTER — Ambulatory Visit (HOSPITAL_COMMUNITY)
Admission: RE | Admit: 2012-09-20 | Discharge: 2012-09-20 | Disposition: A | Discharge status: Home / Self Care | Payer: Medicaid Other | Source: Ambulatory Visit | Attending: General Surgery | Admitting: General Surgery

## 2012-09-20 ENCOUNTER — Encounter (HOSPITAL_COMMUNITY): Payer: Self-pay | Admitting: *Deleted

## 2012-09-20 ENCOUNTER — Ambulatory Visit (HOSPITAL_COMMUNITY): Payer: Medicaid Other

## 2012-09-20 ENCOUNTER — Encounter (HOSPITAL_COMMUNITY)
Admission: RE | Disposition: A | Discharge status: Home / Self Care | Payer: Self-pay | Source: Ambulatory Visit | Attending: General Surgery

## 2012-09-20 ENCOUNTER — Encounter: Payer: Self-pay | Admitting: Genetic Counselor

## 2012-09-20 ENCOUNTER — Ambulatory Visit (HOSPITAL_COMMUNITY): Payer: PRIVATE HEALTH INSURANCE | Admitting: Oncology

## 2012-09-20 ENCOUNTER — Ambulatory Visit (HOSPITAL_COMMUNITY): Payer: Medicaid Other | Admitting: Anesthesiology

## 2012-09-20 DIAGNOSIS — C50419 Malignant neoplasm of upper-outer quadrant of unspecified female breast: Secondary | ICD-10-CM | POA: Insufficient documentation

## 2012-09-20 DIAGNOSIS — C50919 Malignant neoplasm of unspecified site of unspecified female breast: Secondary | ICD-10-CM

## 2012-09-20 DIAGNOSIS — C787 Secondary malignant neoplasm of liver and intrahepatic bile duct: Secondary | ICD-10-CM | POA: Insufficient documentation

## 2012-09-20 HISTORY — PX: PORTACATH PLACEMENT: SHX2246

## 2012-09-20 LAB — BASIC METABOLIC PANEL
BUN: 10 mg/dL (ref 6–23)
Calcium: 9.3 mg/dL (ref 8.4–10.5)
Creatinine, Ser: 0.82 mg/dL (ref 0.50–1.10)
GFR calc Af Amer: >90 mL/min (ref >90)

## 2012-09-20 LAB — SURGICAL PCR SCREEN: Staphylococcus aureus: NEGATIVE

## 2012-09-20 LAB — GLUCOSE, CAPILLARY
Glucose-Capillary: 107 mg/dL — ABNORMAL HIGH (ref 70–99)
Glucose-Capillary: 106 mg/dL — ABNORMAL HIGH (ref 70–99)

## 2012-09-20 SURGERY — INSERTION, TUNNELED CENTRAL VENOUS DEVICE, WITH PORT
Anesthesia: General | Wound class: Clean

## 2012-09-20 MED ORDER — MIDAZOLAM HCL 5 MG/5ML IJ SOLN
INTRAMUSCULAR | Status: DC | PRN
  Administered 2012-09-20: 2 mg via INTRAVENOUS

## 2012-09-20 MED ORDER — HEPARIN SOD (PORK) LOCK FLUSH 100 UNIT/ML IV SOLN
INTRAVENOUS | Status: DC | PRN
  Administered 2012-09-20: 300 [IU] via INTRAVENOUS

## 2012-09-20 MED ORDER — PHENYLEPHRINE HCL 10 MG/ML IJ SOLN
INTRAMUSCULAR | Status: DC | PRN
  Administered 2012-09-20: 80 ug via INTRAVENOUS
  Administered 2012-09-20: 40 ug via INTRAVENOUS

## 2012-09-20 MED ORDER — BUPIVACAINE HCL (PF) 0.25 % IJ SOLN
INTRAMUSCULAR | Status: AC
  Filled 2012-09-20: qty 30

## 2012-09-20 MED ORDER — FENTANYL CITRATE 0.05 MG/ML IJ SOLN
25.0000 ug | INTRAMUSCULAR | Status: DC | PRN
  Administered 2012-09-20: 25 ug via INTRAVENOUS

## 2012-09-20 MED ORDER — LIDOCAINE-EPINEPHRINE (PF) 1 %-1:200000 IJ SOLN
INTRAMUSCULAR | Status: DC | PRN
  Administered 2012-09-20: 11 mL

## 2012-09-20 MED ORDER — SODIUM CHLORIDE 0.9 % IR SOLN: Status: DC | PRN

## 2012-09-20 MED ORDER — OXYCODONE HCL 5 MG/5ML PO SOLN: 5.0000 mg | Freq: Once | ORAL | Status: DC | PRN

## 2012-09-20 MED ORDER — ONDANSETRON HCL 4 MG/2ML IJ SOLN
INTRAMUSCULAR | Status: DC | PRN
  Administered 2012-09-20: 4 mg via INTRAVENOUS

## 2012-09-20 MED ORDER — DEXAMETHASONE SODIUM PHOSPHATE 4 MG/ML IJ SOLN
INTRAMUSCULAR | Status: DC | PRN
Start: 2012-09-20 — End: 2012-09-20
  Administered 2012-09-20: 4 mg via INTRAVENOUS

## 2012-09-20 MED ORDER — MUPIROCIN 2 % EX OINT
TOPICAL_OINTMENT | Freq: Two times a day (BID) | CUTANEOUS | Status: DC
  Filled 2012-09-20: qty 22

## 2012-09-20 MED ORDER — LACTATED RINGERS IV SOLN
INTRAVENOUS | Status: DC | PRN
  Administered 2012-09-20: 10:00:00 via INTRAVENOUS

## 2012-09-20 MED ORDER — ARTIFICIAL TEARS OP OINT
TOPICAL_OINTMENT | OPHTHALMIC | Status: DC | PRN
  Administered 2012-09-20: 1 via OPHTHALMIC

## 2012-09-20 MED ORDER — HEPARIN SOD (PORK) LOCK FLUSH 100 UNIT/ML IV SOLN
INTRAVENOUS | Status: AC
  Filled 2012-09-20: qty 5

## 2012-09-20 MED ORDER — METOCLOPRAMIDE HCL 5 MG/ML IJ SOLN
10.0000 mg | Freq: Once | INTRAMUSCULAR | Status: AC | PRN
  Administered 2012-09-20: 10 mg via INTRAVENOUS

## 2012-09-20 MED ORDER — FENTANYL CITRATE 0.05 MG/ML IJ SOLN
INTRAMUSCULAR | Status: AC
  Filled 2012-09-20: qty 2

## 2012-09-20 MED ORDER — PROPOFOL 10 MG/ML IV BOLUS
INTRAVENOUS | Status: DC | PRN
  Administered 2012-09-20: 200 mg via INTRAVENOUS

## 2012-09-20 MED ORDER — LIDOCAINE HCL (CARDIAC) 20 MG/ML IV SOLN
INTRAVENOUS | Status: DC | PRN
  Administered 2012-09-20: 70 mg via INTRAVENOUS

## 2012-09-20 MED ORDER — LACTATED RINGERS IV SOLN
INTRAVENOUS | Status: DC
  Administered 2012-09-20: 10:00:00 via INTRAVENOUS

## 2012-09-20 MED ORDER — 0.9 % SODIUM CHLORIDE (POUR BTL) OPTIME
TOPICAL | Status: DC | PRN
  Administered 2012-09-20: 1000 mL

## 2012-09-20 MED ORDER — LIDOCAINE-EPINEPHRINE (PF) 1 %-1:200000 IJ SOLN
INTRAMUSCULAR | Status: AC
  Filled 2012-09-20: qty 10

## 2012-09-20 MED ORDER — HYDROCODONE-ACETAMINOPHEN 5-325 MG PO TABS: 1.0000 | ORAL_TABLET | ORAL | Status: DC | PRN

## 2012-09-20 MED ORDER — FENTANYL CITRATE 0.05 MG/ML IJ SOLN
INTRAMUSCULAR | Status: DC | PRN
  Administered 2012-09-20: 100 ug via INTRAVENOUS
  Administered 2012-09-20: 25 ug via INTRAVENOUS

## 2012-09-20 MED ORDER — SODIUM CHLORIDE 0.9 % IR SOLN
Status: DC | PRN
  Administered 2012-09-20: 11:00:00

## 2012-09-20 MED ORDER — METOCLOPRAMIDE HCL 5 MG/ML IJ SOLN
INTRAMUSCULAR | Status: AC
  Filled 2012-09-20: qty 2

## 2012-09-20 MED ORDER — MUPIROCIN 2 % EX OINT
TOPICAL_OINTMENT | CUTANEOUS | Status: AC
  Administered 2012-09-20: 1
  Filled 2012-09-20: qty 22

## 2012-09-20 MED ORDER — OXYCODONE HCL 5 MG PO TABS: 5.0000 mg | ORAL_TABLET | Freq: Once | ORAL | Status: DC | PRN

## 2012-09-20 SURGICAL SUPPLY — 57 items
ADH SKN CLS APL DERMABOND .7 (GAUZE/BANDAGES/DRESSINGS) ×1
BAG DECANTER FOR FLEXI CONT (MISCELLANEOUS) ×2 IMPLANT
BLADE SURG 10 STRL SS (BLADE) ×2 IMPLANT
BLADE SURG 15 STRL LF DISP TIS (BLADE) ×1 IMPLANT
BLADE SURG 15 STRL SS (BLADE) ×2
BLADE SURG ROTATE 9660 (MISCELLANEOUS) IMPLANT
BOOT SUTURE AID YELLOW STND (SUTURE) ×1 IMPLANT
CANISTER SUCTION 2500CC (MISCELLANEOUS) ×1 IMPLANT
CHLORAPREP W/TINT 10.5 ML (MISCELLANEOUS) ×2 IMPLANT
CLOTH BEACON ORANGE TIMEOUT ST (SAFETY) ×2 IMPLANT
COVER PROBE W GEL 5X96 (DRAPES) ×1 IMPLANT
COVER SURGICAL LIGHT HANDLE (MISCELLANEOUS) ×2 IMPLANT
CRADLE DONUT ADULT HEAD (MISCELLANEOUS) ×2 IMPLANT
DECANTER SPIKE VIAL GLASS SM (MISCELLANEOUS) ×2 IMPLANT
DERMABOND ADVANCED (GAUZE/BANDAGES/DRESSINGS) ×1
DERMABOND ADVANCED .7 DNX12 (GAUZE/BANDAGES/DRESSINGS) ×1 IMPLANT
DRAPE C-ARM 42X72 X-RAY (DRAPES) ×2 IMPLANT
DRAPE CHEST BREAST 15X10 FENES (DRAPES) ×1 IMPLANT
DRAPE LAPAROSCOPIC ABDOMINAL (DRAPES) ×2 IMPLANT
DRAPE UTILITY 15X26 W/TAPE STR (DRAPE) ×4 IMPLANT
ELECT CAUTERY BLADE 6.4 (BLADE) ×2 IMPLANT
ELECT REM PT RETURN 9FT ADLT (ELECTROSURGICAL) ×2
ELECTRODE REM PT RTRN 9FT ADLT (ELECTROSURGICAL) ×1 IMPLANT
GAUZE SPONGE 4X4 16PLY XRAY LF (GAUZE/BANDAGES/DRESSINGS) ×2 IMPLANT
GLOVE EUDERMIC 7 POWDERFREE (GLOVE) ×2 IMPLANT
GOWN PREVENTION PLUS XLARGE (GOWN DISPOSABLE) ×2 IMPLANT
GOWN STRL NON-REIN LRG LVL3 (GOWN DISPOSABLE) ×2 IMPLANT
INTRODUCER 13FR (MISCELLANEOUS) IMPLANT
INTRODUCER COOK 11FR (CATHETERS) IMPLANT
KIT BASIN OR (CUSTOM PROCEDURE TRAY) ×2 IMPLANT
KIT PORT POWER 8FR ISP CVUE (Catheter) ×1 IMPLANT
KIT PORT POWER 9.6FR MRI PREA (Catheter) IMPLANT
KIT PORT POWER ISP 8FR (Catheter) IMPLANT
KIT POWER CATH 8FR (Catheter) IMPLANT
KIT ROOM TURNOVER OR (KITS) ×2 IMPLANT
NDL HYPO 25GX1X1/2 BEV (NEEDLE) ×1 IMPLANT
NEEDLE HYPO 25GX1X1/2 BEV (NEEDLE) ×2 IMPLANT
NS IRRIG 1000ML POUR BTL (IV SOLUTION) ×2 IMPLANT
PACK SURGICAL SETUP 50X90 (CUSTOM PROCEDURE TRAY) ×2 IMPLANT
PAD ARMBOARD 7.5X6 YLW CONV (MISCELLANEOUS) ×2 IMPLANT
PENCIL BUTTON HOLSTER BLD 10FT (ELECTRODE) ×2 IMPLANT
SET INTRODUCER 12FR PACEMAKER (SHEATH) IMPLANT
SET SHEATH INTRODUCER 10FR (MISCELLANEOUS) IMPLANT
SHEATH COOK PEEL AWAY SET 9F (SHEATH) IMPLANT
SURGILUBE 3G PEEL PACK STRL (MISCELLANEOUS) IMPLANT
SUT MNCRL AB 4-0 PS2 18 (SUTURE) ×2 IMPLANT
SUT PROLENE 2 0 CT2 30 (SUTURE) ×3 IMPLANT
SUT VIC AB 3-0 SH 18 (SUTURE) ×2 IMPLANT
SYR 20ML ECCENTRIC (SYRINGE) ×4 IMPLANT
SYR 5ML LUER SLIP (SYRINGE) ×2 IMPLANT
SYR BULB 3OZ (MISCELLANEOUS) ×2 IMPLANT
SYR CONTROL 10ML LL (SYRINGE) ×2 IMPLANT
SYRINGE 10CC LL (SYRINGE) ×2 IMPLANT
TOWEL OR 17X24 6PK STRL BLUE (TOWEL DISPOSABLE) ×2 IMPLANT
TOWEL OR 17X26 10 PK STRL BLUE (TOWEL DISPOSABLE) ×2 IMPLANT
TUBE CONNECTING 12X1/4 (SUCTIONS) IMPLANT
YANKAUER SUCT BULB TIP NO VENT (SUCTIONS) IMPLANT

## 2012-09-20 NOTE — Transfer of Care (Signed)
Immediate Anesthesia Transfer of Care Note  Patient: Brenda Avery  Procedure(s) Performed: Procedure(s) (LRB) with comments: INSERTION PORT-A-CATH (N/A) - port a cath insertion with flouro and ultrasound   Patient Location: PACU  Anesthesia Type:General  Level of Consciousness: awake, alert  and oriented  Airway & Oxygen Therapy: Patient Spontanous Breathing and Patient connected to nasal cannula oxygen  Post-op Assessment: Report given to PACU RN and Post -op Vital signs reviewed and stable  Post vital signs: Reviewed and stable  Complications: No apparent anesthesia complications

## 2012-09-20 NOTE — Progress Notes (Signed)
Dr. Glenford Peers requested a consultation for genetic counseling and risk assessment for Brenda Avery, a 42 y.o. female, for discussion of her personal history of breast cancer and family history of breast and cervical cancer. She presents to clinic today to discuss the possibility of a genetic predisposition to cancer, and to further clarify her risks, as well as her family members' risks for cancer.   HISTORY OF PRESENT ILLNESS: In 2014, at the age of 13, Brenda Avery was diagnosed with invasive ductal carcinoma of the breast.     Past Medical History  Diagnosis Date  . Hyperlipidemia     diet therapy per patient request  . 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 Hematuria 2000/2001    evaluted in [place] with negative results  . Breast CA     left  . Diabetes     diet therapy     Past Surgical History  Procedure Date  . Tubal ligation 2004  . Rectocele repair 2004  . Cystoscopy 2001  . Excision of facial nevi 2004  . Breast biopsy 2014    History  Substance Use Topics  . Smoking status: Never Smoker   . Smokeless tobacco: Never Used  . Alcohol Use: No    REPRODUCTIVE HISTORY AND PERSONAL RISK ASSESSMENT FACTORS: Menarche was at age 13-10.   Premenopausal Uterus Intact: Yes Ovaries Intact: Yes G2P2A0 , first live birth at age 65  She has not previously undergone treatment for infertility.   OCP use for 15 years   She has not used HRT in the past.    FAMILY HISTORY:  We obtained a detailed, 4-generation family history.  Significant diagnoses are listed below: Family History  Problem Relation Age of Onset  . Hypertension Mother   . Diabetes Mother     MI  . Heart disease Mother   . Hypertension Sister     x4 only one HTN and one thats DI  . Hypertension Brother     x4 only 1 htn & dm  . Heart disease Father   . Breast cancer Maternal Aunt     diagnosed in her 11s  . Breast cancer Paternal Aunt     diagnosed in her 48s  . Cervical cancer  Sister 28  . Breast cancer Maternal Aunt     diagnosed in her 67s  . Breast cancer Cousin     3 maternal cousins - 2 in their 28s, 1 in her 64s  The patient was diagnosed with breast cancer at age 79.  She has six paternal half siblings who do not have cancer.  She has four maternal half brothers and four maternal half sisters.  One sister was diagnosed with cervical cancer at age 20.  The patient's mother died at age 47 from a heart attach.  She had six siblings.  Two sisters had breast cancer, one in her 29s and the other in her 3s.  Both women had daughters diagnosed with breast cancer in their 66s-50s.  The patient's father died of a heart attack in his 81s.  He had several brothers and sisters. One sister had breast cancer and died at 8.  There is no other reported family history of cancer.  Patient's maternal ancestors are of [place] descent, and paternal ancestors are of African [place] descent. There is no reported Ashkenazi Jewish ancestry. There is no known consanguinity.  GENETIC COUNSELING RISK ASSESSMENT, DISCUSSION,  AND SUGGESTED FOLLOW UP: We reviewed the natural history and genetic etiology of sporadic, familial and hereditary cancer syndromes.  About 5-10% of breast cancer is hereditary.  Of this, about 85% is the result of a BRCA1 or BRCA2 mutation.  We reviewed the red flags of hereditary cancer syndromes and the dominant inheritance patterns.  If the BRCA testing is negative, we discussed that we could be testing for the wrong gene.  We discussed gene panels, and that several cancer genes that are associated with different cancers can be tested at the same time.  Because of the different types of cancer that are in the patient's family, we will consider one of the panel tests if she is negative for BRCA mutations.   The patient's personal history of breast cancer and family history of breast cancer is suggestive of the following possible diagnosis:  hereditary cancer syndrome  We discussed that identification of a hereditary cancer syndrome may help her care providers tailor the patients medical management. If a mutation indicating a hereditary cancer syndrome is detected in this case, the [employer] recommendations would include increased cancer surveillance and possible prophylactic surgery. If a mutation is detected, the patient will be referred back to the referring provider and to any additional appropriate care providers to discuss the relevant options.   If a mutation is not found in the patient, this will decrease the likelihood of a hereditary cancer syndrome as the explanation for her breast cancer. Cancer surveillance options would be discussed for the patient according to the appropriate standard National Comprehensive Cancer Network and American Cancer Society guidelines, with consideration of their personal and family history risk factors. In this case, the patient will be referred back to their care providers for discussions of management.   In order to estimate her chance of having a BRCA mutation, we used statistical models (Penn II) and laboratory data that take into account her personal medical history, family history and ancestry.  Because each model is different, there can be a lot of variability in the risks they give.  Therefore, these numbers must be considered a rough range and not a precise risk of having a BRCA mutation.  These models estimate that she has approximately a 15% chance of having a mutation. Based on this assessment of her family and personal history, genetic testing is recommended.  After considering the risks, benefits, and limitations, the patient provided informed consent for  the following  testing: BRACAnalysisand MyRisk  through [employer].   Per the patient's request, we will contact her by telephone to discuss these results. A follow up genetic counseling visit will  be scheduled if indicated.  The patient was seen for a total of 60 minutes, greater than 50% of which was spent face-to-face counseling.  This plan is being carried out per Dr. Randall An recommendations.  This note will also be sent to the referring provider via the electronic medical record. The patient will be supplied with a summary of this genetic counseling discussion as well as educational information on the discussed hereditary cancer syndromes following the conclusion of their visit.   Patient was discussed with Dr. Drue Second.   _______________________________________________________________________ For Office Staff:  Number of people involved in session: 4 Was an Intern/ student involved with case: no

## 2012-09-20 NOTE — Anesthesia Procedure Notes (Signed)
Procedure Name: LMA Insertion Date/Time: 09/20/2012 10:14 AM Performed by: Gayla Medicus Pre-anesthesia Checklist: Timeout performed, Patient identified, Emergency Drugs available, Suction available and Patient being monitored Patient Re-evaluated:Patient Re-evaluated prior to inductionOxygen Delivery Method: Circle system utilized Preoxygenation: Pre-oxygenation with 100% oxygen Intubation Type: IV induction LMA: LMA inserted LMA Size: 4.0 Number of attempts: 1 Placement Confirmation: positive ETCO2 and breath sounds checked- equal and bilateral Tube secured with: Tape Dental Injury: Teeth and Oropharynx as per pre-operative assessment

## 2012-09-20 NOTE — Preoperative (Signed)
Beta Blockers   Reason not to administer Beta Blockers: not prescribed 

## 2012-09-20 NOTE — Anesthesia Postprocedure Evaluation (Signed)
Anesthesia Post Note  Patient: Brenda Avery  Procedure(s) Performed: Procedure(s) (LRB): INSERTION PORT-A-CATH (N/A)  Anesthesia type: General  Patient location: PACU  Post pain: Pain level controlled  Post assessment: Patient's Cardiovascular Status Stable  Last Vitals:  Filed Vitals:   09/20/12 1407  BP: 133/77  Pulse: 59  Temp: 36.7 C  Resp: 18    Post vital signs: Reviewed and stable  Level of consciousness: alert  Complications: No apparent anesthesia complications

## 2012-09-20 NOTE — Interval H&P Note (Signed)
History and Physical Interval Note:  09/20/2012 10:02 AM  Brenda Avery  has presented today for surgery, with the diagnosis of BREAST CANCER METASTAZIED TO LIVER   The goals and the various methods of treatment have been discussed with the patient and family. After consideration of risks, benefits and other options for treatment, the patient has consented to  Procedure(s) (LRB) with comments: INSERTION PORT-A-CATH (N/A) - port a cath insertion with flouro and ultrasound  as a surgical intervention .  The patient's history has been reviewed, patient examined today, no change in status, stable for surgery.  I have reviewed the patient's chart and labs.  Questions were answered to the patient's satisfaction.     Ernestene Mention

## 2012-09-20 NOTE — Anesthesia Preprocedure Evaluation (Signed)
Anesthesia Evaluation  Patient identified by MRN, date of birth, ID band Patient awake    Reviewed: Allergy & Precautions, H&P , NPO status , Patient's Chart, lab work & pertinent test results, reviewed documented beta blocker date and time   Airway Mallampati: II TM Distance: >3 FB Neck ROM: full    Dental   Pulmonary neg pulmonary ROS,  breath sounds clear to auscultation        Cardiovascular + dysrhythmias Rhythm:regular     Neuro/Psych negative neurological ROS  negative psych ROS   GI/Hepatic negative GI ROS, Neg liver ROS,   Endo/Other  diabetes  Renal/GU negative Renal ROS  negative genitourinary   Musculoskeletal   Abdominal   Peds  Hematology negative hematology ROS (+)   Anesthesia Other Findings See surgeon's H&P   Reproductive/Obstetrics negative OB ROS                           Anesthesia Physical Anesthesia Plan  ASA: II  Anesthesia Plan: General   Post-op Pain Management:    Induction: Intravenous  Airway Management Planned: LMA  Additional Equipment:   Intra-op Plan:   Post-operative Plan: Extubation in OR  Informed Consent: I have reviewed the patients History and Physical, chart, labs and discussed the procedure including the risks, benefits and alternatives for the proposed anesthesia with the patient or authorized representative who has indicated his/her understanding and acceptance.   Dental Advisory Given  Plan Discussed with: CRNA and Surgeon  Anesthesia Plan Comments:         Anesthesia Quick Evaluation

## 2012-09-20 NOTE — Op Note (Signed)
Patient Name:           Brenda Avery   Date of Surgery:        09/20/2012  Pre op Diagnosis:      Invasive cancer left breast, stage IV  Post op Diagnosis:    Same  Procedure:                 Insertion of 8 [place] [employer] Port ClearVUE  tunneled venous access device with fluoroscopic guidance and ultrasound guidance  Surgeon:                     Angelia Mould. Derrell Lolling, M.D., FACS  Assistant:                      None  Operative Indications:   Brenda Avery is a 42 y.o. female. She is referred by Dr. Glenford Peers for evaluation and management of a large cancer in the upper outer quadrant of the left breast which is also probably metastatic to multiple sites. Dr. [name] is her primary care physician.   Her health is good. She has no prior breast problems. She developed pain in her left breast and felt a mass. Mammograms were performed showing a 5 cm mass and a second 2.8 cm mass in the left breast at the 2:00 position. There were suspicious calcifications anterior and inferior to the mass. The left axilla was negative radiographically. There was a cyst in the left breast which was aspirated. 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 HER-2 negative.  Exam reveals a 7 cm, at least, mobile mass in the upper outer quadrant of her left breast. Subsequent PET scan is positive with uptake in the left supraclavicular node, the sternum, pulmonary hila, mediastinum, liver, inguinal and iliac nodes.  Dr. Glenford Peers has seen her and has recommended Port-A-Cath placement, liver biopsy, genetic counseling, chemotherapy, and hormonal therapy after chemotherapy is completed. Hopefully she will become a candidate for left breast surgery for local control at some point in the future.  Family history is negative for breast cancer in any first line relatives. She has a first cousin recently diagnosed with breast cancer at  age 16 and 2 paternal aunts who died of breast cancer.   Operative Findings:       The right subclavian approach was attempted but the guidewire continued to pass across the midline into the left subclavian vein and I was unable to direct it down into the superior vena cava. The port was successfully placed through the right internal jugular vein. The tip of the catheter appears to be in the SVC/right atrial junction.  Procedure in Detail:          Following the induction of a general LMA anesthetic the patient was positioned with a roll behind her shoulders and her arms tucked at her sides. The neck and chest were prepped and draped in a sterile fashion. Intravenous antibiotics were given. Surgical time out was performed. 0.5% Marcaine with epinephrine was used as a local infiltration anesthetic. A right subclavian venipuncture was performed and a guidewire inserted without difficulty. Fluoroscopy showed that the guidewire had passed across the midline into the left subclavian vein. Using real-time fluoroscopy I attempted multiple times to pull the wire back and passed it down the superior vena cava and was unable to do so. I abandoned this  approach and removed the wire.  Anultrasound was brought to the field and  I identified the right internal jugular vein and carotid artery. With the ultrasound in place I performed a right internal jugular venipuncture and inserted the guidewire into the superior vena cava.Marland Kitchen Ultrasound  was performed to confirm position of the wire. I then used fluoroscopy to draw a template on the chest wall and  measured the catheter correctly to position the tip in the right position. A small incision was made in the right neck at the wire insertion site. I then made a 3 cm transverse incision in the right parasternal area about 2 cm below the head of the clavicle. I created a pocket at the level of the pectoralis fascia. Using a tunneling device I drew the catheter from the neck  incision down to the port pocket site. I then measured the catheter using the template on the chest wall and found that it needed to be about 23 cm in length. After cutting the catheter I secured it to the port the locking device and flushed with heparinized saline. I sutured the port to the pectoralis fascia with 3 interrupted sutures of 2-0 Prolene. I inserted the dilator and peel-away sheath over the guidewire, removed the dilator and wire, threaded the catheter through the peel-away sheath and removed the peel-away sheath. I had excellent blood return and the catheter flushed easily. Fluoroscopy confirmed that the catheter was in good position with the tip in the superior vena cava right at the right atrial junction. I then flushed the port with concentrated heparin. Subcutaneous tissue was closed with interrupted 3-0 Vicryl sutures and skin closed with running subcuticular sutures of 4-0 Monocryl and Dermabond. The patient tolerated the procedure well and was taken recovery in stable condition. EBL 10 cc. Counts correct. Complications none.     Angelia Mould. Derrell Lolling, M.D., FACS General and Minimally Invasive Surgery Breast and Colorectal Surgery  09/20/2012 11:33 AM

## 2012-09-21 ENCOUNTER — Encounter (HOSPITAL_COMMUNITY): Payer: Medicaid Other | Attending: Oncology | Admitting: Oncology

## 2012-09-21 ENCOUNTER — Telehealth (INDEPENDENT_AMBULATORY_CARE_PROVIDER_SITE_OTHER): Payer: Self-pay | Admitting: General Surgery

## 2012-09-21 ENCOUNTER — Encounter (HOSPITAL_COMMUNITY): Payer: Self-pay | Admitting: Oncology

## 2012-09-21 DIAGNOSIS — C50919 Malignant neoplasm of unspecified site of unspecified female breast: Secondary | ICD-10-CM

## 2012-09-21 DIAGNOSIS — D869 Sarcoidosis, unspecified: Secondary | ICD-10-CM

## 2012-09-21 DIAGNOSIS — C7951 Secondary malignant neoplasm of bone: Secondary | ICD-10-CM

## 2012-09-21 DIAGNOSIS — C778 Secondary and unspecified malignant neoplasm of lymph nodes of multiple regions: Secondary | ICD-10-CM

## 2012-09-21 DIAGNOSIS — C50419 Malignant neoplasm of upper-outer quadrant of unspecified female breast: Secondary | ICD-10-CM

## 2012-09-21 DIAGNOSIS — C800 Disseminated malignant neoplasm, unspecified: Secondary | ICD-10-CM | POA: Insufficient documentation

## 2012-09-21 DIAGNOSIS — B370 Candidal stomatitis: Secondary | ICD-10-CM | POA: Insufficient documentation

## 2012-09-21 NOTE — Progress Notes (Signed)
Problem #1 advanced left-sided breast cancer, infiltrating ductal type, either stage III versus stage IV. ER +100%, PR +30%, HER-2/neu negative, Ki-67 marker 34%. She has a greater than or equal to 7 cm tumor to as much is 8.5 centimeters in the upper-outer quadrant of the left breast. There are no clinically positive lymph nodes in the left axilla. She also had no positive supraclavicular, infraclavicular nodes clinically either. 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 PET scan suggested metastatic disease in multiple sites including liver and lymph nodes of the chest, subclavicular area and possibly bony areas. Problem #3 excess weight Problem #4 sulfonamide allergy, metronidazole allergy  This lady presented with a very large cancer in the left breast, upper outer quadrant. Her mammograms were reviewed 2011 including ultrasound and diagnostic mammography which did not reveal a distinct mass at that time. She found this mass recently on physical exam. A cancer marker was found to be very elevated at a level of 197 for the CA 27.9 cancer marker. PET scan was socially performed for 2 reasons namely the size of the lesion and the elevated tumor marker. PET scan returned showing multiple areas of involvement including bone, liver, and multiple lymph nodes. The left breast also has significant uptake. She then underwent liver biopsy after discussing her case with Dr. Fredia Sorrow and Dr. Derrell Lolling. This however returned showing sarcoidosis only. No other lesions are easily accessible for biopsy without significant invasive procedural issues Therefore we have sent her for genetic testing which she has performed however results are not back. She has a port placed by Dr. Derrell Lolling. After a long discussion with her and her family and myself and Dr. Derrell Lolling we have decided to give her the benefit of the doubt in the hopes that she has localized disease only and we therefore will treat for curative intent. We will proceed  with neoadjuvant chemotherapy with dose dense Adriamycin and Cytoxan followed by weekly paclitaxel for 12 weeks. We will start therapy as soon as possible. In the meantime her genetic should be back. She would like to consider breast preservation therapy if at all possible. She will need radiation therapy consultation as well. We will schedule that with her next visit. Dr. Derrell Lolling and I both believe she will need an axillary lymph node dissection when she has definitive surgery on her breast. That would be done and whether we give her chemotherapy before or after surgery. She and her family are ready to proceed

## 2012-09-21 NOTE — Progress Notes (Signed)
Patient here for follow-up of test results.

## 2012-09-21 NOTE — Telephone Encounter (Signed)
Spoke with this patient she states she is doing well, She also states she will call back in for f/u visit in 6 weeks

## 2012-09-21 NOTE — Patient Instructions (Addendum)
[facility] Discharge Instructions  RECOMMENDATIONS MADE BY THE CONSULTANT AND ANY TEST RESULTS WILL BE SENT TO YOUR REFERRING PHYSICIAN.  EXAM FINDINGS BY THE PHYSICIAN TODAY AND SIGNS OR SYMPTOMS TO REPORT TO CLINIC OR PRIMARY PHYSICIAN: Discussion by MD.  Bonita Quin will get definitive chemotherapy either before or after surgery.   We will get you scheduled to meet with our nurse navigator - Rosana Berger for teaching about your chemotherapy.  MEDICATIONS PRESCRIBED:  none  SPECIAL INSTRUCTIONS/FOLLOW-UP: Chemotherapy tentatively scheduled for 10/04/12.  Thank you for choosing [facility] to provide your oncology and hematology care.  To afford each patient quality time with our providers, please arrive at least 15 minutes before your scheduled appointment time.  With your help, our goal is to use those 15 minutes to complete the necessary work-up to ensure our physicians have the information they need to help with your evaluation and healthcare recommendations.    Effective January 1st, 2014, we ask that you re-schedule your appointment with our physicians should you arrive 10 or more minutes late for your appointment.  We strive to give you quality time with our providers, and arriving late affects you and other patients whose appointments are after yours.    Again, thank you for choosing [facility].  Our hope is that these requests will decrease the amount of time that you wait before being seen by our physicians.       _____________________________________________________________  Should you have questions after your visit to [facility], please contact our office at [phone] between the hours of 8:30 a.m. and 5:00 p.m.  Voicemails left after 4:30 p.m. will not be returned until the following business day.  For prescription refill requests, have your pharmacy contact our office with your prescription refill request.

## 2012-09-22 ENCOUNTER — Encounter (HOSPITAL_COMMUNITY): Payer: Self-pay | Admitting: General Surgery

## 2012-09-22 NOTE — Patient Instructions (Addendum)
[facility]   CHEMOTHERAPY INSTRUCTIONS   Adriamycin - bone marrow suppression, nausea, vomiting, hair loss, mouth sores, cardiotoxicity (this is why we do the 2D Echoes), sensitivity to light, will turn urine red for a few hours after receiving it. You will take this medication every [redacted] weeks along with Cytoxan.   Cytoxan - can cause hemorrhagic cystitis (bloody urine) - this chemo irritates your bladder! We need you drinking 64 oz of fluid (preferably water/decaff fluids) 2 days prior to chemo and for up to 4-5 days after chemo. Drink more if you can. Do not hold your urine. Urinate before you go to bed and if you wake up in the middle of the night. This can also cause nausea/vomiting and hair loss. You will take this medication every [redacted] weeks along with Adriamycin.   Taxol - the first time you receive this drug we will titrate it very slowly to ensure that you do not have or are not having an allergic reaction to the chemo. You will also be responsible for taking a steroid called Dexamethasone before and after chemo. This is to reduce your risk of having an allergic reaction to the Taxol. Side Effects: hair loss, lowers your white blood cells (fight infection), abdominal pain, muscle aches, nausea/vomiting, irritation to the mouth (mouth sores, pain in your mouth) *neuropathy - numbness/tingling/burning in hands/fingers/feet/toes. We need to know as soon as this begins to happen so that we can monitor it and treat if necessary. The numbness generally begins in the fingertips of tips of toes and then begins to travel up the finger/toe/hand/foot. We never want you getting to where you can't pick up a pen, coin, zip a zipper, button a button, or have trouble walking. You must tell us immediately if you are experiencing peripheral neuropathy! You will start this drug 2-3 weeks after your complete your adriamycin/cytoxan chemo.     Neulasta - this is not chemo but being  given to you because you have had chemo. This medication works by stimulating your bone marrow which is inside of your bones to produce more white blood cells. White blood cells protect [place] from infection. Chemo destroys your cancer cells and white blood cells and that is why you need the Neulasta. This medication will be given to you by a nurse in the Cancer Center. It is given 20-24 hours from the completion of chemo. It comes in the form of an injection. It will be administered into the fatty tissue of your abdomen. The most common side effect associated with this injection is bone pain. The bone pain can occur as soon as a few hours after the injection to several days after the injection. Some people have mild bone pain and some have none. Few people experience severe bone pain. We want and need to know how you respond to this injection. If you develop moderate to severe bone pain - we need to know! The drug of choice to help relieve the bone pain is Aleve. Take as bottle directs. If another doctor has told you to avoid NSAIDs - like Aleve or Ibuprofen, then you may take Tylenol. You may also apply a heating pad to the affected areas. You will receive Neulasta after your adriamycin/cytoxan chemo.   POTENTIAL SIDE EFFECTS OF TREATMENT: Increased Susceptibility to Infection, Vomiting, Constipation, Red or Pink Urine (with Adriamycin), Hair Thinning, Changes in Character of Skin and Nails (brittleness, dryness,etc.), Bone Marrow Suppression, Abdominal Cramping, Blood in Urine, Complete  Hair Loss, Nausea, Diarrhea, Sun Sensitivity and Mouth Sores   SELF IMAGE NEEDS AND REFERRALS MADE: Obtain hair accessories as soon as possible (wigs, scarves, turbans,caps,etc.) and Referral to Look Good, Feel Better consultant   EDUCATIONAL MATERIALS GIVEN AND REVIEWED: How Chemotherapy Affects Your Blood Counts  Specific Instructions Sheets Adriamycin, Cytoxan, Taxol, Neulasta, Zofran, Dexamethasone, Pepcid, Benadryl,  Compazine, Ativan, EMLA cream   SELF CARE ACTIVITIES WHILE ON CHEMOTHERAPY: Increase your fluid intake 48 hours prior to treatment and drink at least 2 quarts per day after treatment., No alcohol intake., No aspirin or other medications unless approved by your oncologist., Eat foods that are light and easy to digest., Eat foods at cold or room temperature., No fried, fatty, or spicy foods immediately before or after treatment., Have teeth cleaned professionally before starting treatment. Keep dentures and partial plates clean., Use soft toothbrush and do not use mouthwashes that contain alcohol. Biotene is a good mouthwash that is available at most pharmacies or may be ordered by calling [phone]., Use warm salt water gargles (1 teaspoon salt per 1 quart warm water) before and after meals and at bedtime. Or you may rinse with 2 tablespoons of three -percent hydrogen peroxide mixed in eight ounces of water., Always use sunscreen with SPF (Sun Protection Factor) of 30 or higher., Use your nausea medication as directed to prevent nausea., Use your stool softener or laxative as directed to prevent constipation. and Use your anti-diarrheal medication as directed to stop diarrhea.  Please wash your hands for at least 30 seconds using warm soapy water. Handwashing is the #1 way to prevent the spread of germs. Stay away from sick people or people who are getting over a cold. If you develop respiratory systems such as green/yellow mucus production or productive cough or persistent cough let us know and we will see if you need an antibiotic. It is a good idea to keep a pair of gloves on when going into grocery stores/[employer] to decrease your risk of coming into contact with germs on the carts, etc. Carry alcohol hand gel with you at all times and use it frequently if out in public. All foods need to be cooked thoroughly. No raw foods. No medium or undercooked meats, eggs. If your food is cooked medium well, it  does not need to be hot pink or saturated with bloody liquid at all. Vegetables and fruits need to be washed/rinsed under the faucet with a dish detergent before being consumed. You can eat raw fruits and vegetables unless we tell you otherwise but it would be best if you cooked them or bought frozen. Do not eat off of salad bars or hot bars unless you really trust the cleanliness of the restaurant. If you need dental work, please let Dr. Mariel Sleet know before you go for your appointment so that we can coordinate the best possible time for you in regards to your chemo regimen. You need to also let your dentist know that you are actively taking chemo. We may need to do labs prior to your dental appointment. We also want your bowels moving at least every other day. If this is not happening, we need to know so that we can get you on a bowel regimen to help you go.      MEDICATIONS: You have been given prescriptions for the following medications:  While taking AC chemo:  Dexamethasone 4mg  tablet. Starting the day after chemo take 2 tablets in the am and 2 tablets in the  pm for 2 days. Then Stop. Repeat with each chemo cycle.  Zofran 8mg  tablet. Starting the 3rd day after chemo, may take 1 tablet two times a day IF needed for nausea/vomiting.   _____________________________________________________________________________________________________  While taking Taxol chemo:  Dexamethasone 4mg  tablet. At 9pm the night before chemo, take 2 tablets. At 3am the morning of chemo, take 2 tablets. Then the day after chemo take 2 tablets in the am and 2 tablets in the pm for 1 day. Then Stop. Repeat this with each chemo cycle.   Zofran 8mg  tablet. Starting the day after chemo, take 1 tablet in the am and 1 tablet in the pm for 1 day. Then may take 1 tablet two times a day IF needed for nausea/vomiting.   ______________________________________________________________________________________________________  [place] Nap 1mg  tablet. Take 1 tablet every 4 hours IF needed for nausea/vomiting. This tablet may make you sleepy. Do not drive while taking.   Compazine 25mg  suppository. Insert 1 rectally every 6 hours IF needed for nausea/vomiting.   You are allowed to take Ativan/lorazepam and Compazine suppositories IF you need them. It doesn't matter which chemo you are taking.   Over-the-Counter:  Colace - this is a stool softener. Take 100mg  capsule 2-6 times a day as needed. If you have to take more than 6 capsules of Colace a day call the Cancer Center.  Senna - this is a mild laxative used to treat mild constipation. May take 2 tabs by mouth daily or up to twice a day as needed for mild constipation.  Milk of Magnesia - this is a laxative used to treat moderate to severe constipation. May take 2-4 tablespoons every 8 hours as needed. May increase to 8 tablespoons x 1 dose and if no bowel movement call the Cancer Center.  Maalox 1 tablespoon every 2 hours if needed for heartburn.   Imodium - this is for diarrhea. Take 2 tabs after 1st loose stool and then 1 tab every 2 hours until you go a total of 12 hours without a loose stool. Call Cancer Center if loose stools continue.   SYMPTOMS TO REPORT AS SOON AS POSSIBLE AFTER TREATMENT:  FEVER GREATER THAN 100.5 F  CHILLS WITH OR WITHOUT FEVER  NAUSEA AND VOMITING THAT IS NOT CONTROLLED WITH YOUR NAUSEA MEDICATION  UNUSUAL SHORTNESS OF BREATH  UNUSUAL BRUISING OR BLEEDING  TENDERNESS IN MOUTH AND THROAT WITH OR WITHOUT PRESENCE OF ULCERS  URINARY PROBLEMS  BOWEL PROBLEMS  UNUSUAL RASH    Wear comfortable clothing and clothing appropriate for easy access to any Portacath or PICC line. Let us know if there is anything that we can do to make your therapy better!      I have been informed and understand all of the  instructions given to me and have received a copy. I have been instructed to call the clinic [phone] or my family physician as soon as possible for continued medical care, if indicated. I do not have any more questions at this time but understand that I may call the Cancer Center or the Patient Navigator at [phone] during office hours should I have questions or need assistance in obtaining follow-up care.      _________________________________________      _______________     __________ Signature of Patient or Authorized Representative        Date  Time      _________________________________________ Nurse's Signature

## 2012-09-23 ENCOUNTER — Ambulatory Visit (HOSPITAL_COMMUNITY)
Admission: RE | Admit: 2012-09-23 | Discharge: 2012-09-23 | Disposition: A | Discharge status: Home / Self Care | Payer: Medicaid Other | Source: Ambulatory Visit | Attending: Oncology | Admitting: Oncology

## 2012-09-23 ENCOUNTER — Encounter (HOSPITAL_BASED_OUTPATIENT_CLINIC_OR_DEPARTMENT_OTHER): Payer: Medicaid Other

## 2012-09-23 DIAGNOSIS — C50919 Malignant neoplasm of unspecified site of unspecified female breast: Secondary | ICD-10-CM

## 2012-09-23 DIAGNOSIS — R002 Palpitations: Secondary | ICD-10-CM | POA: Insufficient documentation

## 2012-09-23 DIAGNOSIS — C787 Secondary malignant neoplasm of liver and intrahepatic bile duct: Secondary | ICD-10-CM

## 2012-09-23 DIAGNOSIS — E119 Type 2 diabetes mellitus without complications: Secondary | ICD-10-CM | POA: Insufficient documentation

## 2012-09-23 DIAGNOSIS — C7952 Secondary malignant neoplasm of bone marrow: Secondary | ICD-10-CM

## 2012-09-23 DIAGNOSIS — C50419 Malignant neoplasm of upper-outer quadrant of unspecified female breast: Secondary | ICD-10-CM

## 2012-09-23 NOTE — Progress Notes (Signed)
*  PRELIMINARY RESULTS* Echocardiogram 2D Echocardiogram has been performed.  [facility] 09/23/2012, 3:30 PM

## 2012-09-23 NOTE — Progress Notes (Signed)
Chemo teaching done and consent signed for Adriamycin/Cytoxan/Taxol. Med/chemo calendar given to patient.

## 2012-09-26 ENCOUNTER — Encounter (HOSPITAL_BASED_OUTPATIENT_CLINIC_OR_DEPARTMENT_OTHER): Payer: Medicaid Other

## 2012-09-26 VITALS — BP 123/79 | HR 85 | Temp 97.0°F | Resp 16 | Wt 189.8 lb

## 2012-09-26 DIAGNOSIS — C7952 Secondary malignant neoplasm of bone marrow: Secondary | ICD-10-CM

## 2012-09-26 DIAGNOSIS — Z5111 Encounter for antineoplastic chemotherapy: Secondary | ICD-10-CM

## 2012-09-26 DIAGNOSIS — C50419 Malignant neoplasm of upper-outer quadrant of unspecified female breast: Secondary | ICD-10-CM

## 2012-09-26 DIAGNOSIS — C50919 Malignant neoplasm of unspecified site of unspecified female breast: Secondary | ICD-10-CM

## 2012-09-26 DIAGNOSIS — C778 Secondary and unspecified malignant neoplasm of lymph nodes of multiple regions: Secondary | ICD-10-CM

## 2012-09-26 LAB — COMPREHENSIVE METABOLIC PANEL
Albumin: 3.2 g/dL — ABNORMAL LOW (ref 3.5–5.2)
Alkaline Phosphatase: 118 U/L — ABNORMAL HIGH (ref 39–117)
BUN: 9 mg/dL (ref 6–23)
Potassium: 3.1 mEq/L — ABNORMAL LOW (ref 3.5–5.1)
Total Protein: 7.5 g/dL (ref 6.0–8.3)

## 2012-09-26 LAB — CBC WITH DIFFERENTIAL/PLATELET
Basophils Relative: 1 % (ref 0–1)
Eosinophils Absolute: 0.2 10*3/uL (ref 0.0–0.7)
Hemoglobin: 11.4 g/dL — ABNORMAL LOW (ref 12.0–15.0)
MCH: 26.5 pg (ref 26.0–34.0)
MCHC: 33.7 g/dL (ref 30.0–36.0)
Monocytes Relative: 11 % (ref 3–12)
Neutrophils Relative %: 72 % (ref 43–77)
Platelets: 260 10*3/uL (ref 150–400)

## 2012-09-26 LAB — CANCER ANTIGEN 27.29: CA 27.29: 276 U/mL — ABNORMAL HIGH (ref 0–39)

## 2012-09-26 MED ORDER — SODIUM CHLORIDE 0.9 % IV SOLN
600.0000 mg/m2 | Freq: Once | INTRAVENOUS | Status: AC
  Administered 2012-09-26: 1160 mg via INTRAVENOUS
  Filled 2012-09-26: qty 58

## 2012-09-26 MED ORDER — SODIUM CHLORIDE 0.9 % IV SOLN
Freq: Once | INTRAVENOUS | Status: AC
  Administered 2012-09-26: 09:00:00 via INTRAVENOUS
  Filled 2012-09-26: qty 5

## 2012-09-26 MED ORDER — PALONOSETRON HCL INJECTION 0.25 MG/5ML
INTRAVENOUS | Status: AC
  Filled 2012-09-26: qty 5

## 2012-09-26 MED ORDER — DEXAMETHASONE SODIUM PHOSPHATE 4 MG/ML IJ SOLN
12.0000 mg | Freq: Once | INTRAMUSCULAR | Status: DC
Start: 2012-09-26 — End: 2012-09-26

## 2012-09-26 MED ORDER — SODIUM CHLORIDE 0.9 % IV SOLN
Freq: Once | INTRAVENOUS | Status: AC
  Administered 2012-09-26: 09:00:00 via INTRAVENOUS

## 2012-09-26 MED ORDER — DOXORUBICIN HCL CHEMO IV INJECTION 2 MG/ML
60.0000 mg/m2 | Freq: Once | INTRAVENOUS | Status: AC
  Administered 2012-09-26: 116 mg via INTRAVENOUS
  Filled 2012-09-26: qty 58

## 2012-09-26 MED ORDER — SODIUM CHLORIDE 0.9 % IV SOLN: 150.0000 mg | Freq: Once | INTRAVENOUS | Status: DC

## 2012-09-26 MED ORDER — PALONOSETRON HCL INJECTION 0.25 MG/5ML
0.2500 mg | Freq: Once | INTRAVENOUS | Status: AC
  Administered 2012-09-26: 0.25 mg via INTRAVENOUS

## 2012-09-26 MED ORDER — HEPARIN SOD (PORK) LOCK FLUSH 100 UNIT/ML IV SOLN
INTRAVENOUS | Status: AC
  Filled 2012-09-26: qty 5

## 2012-09-26 MED ORDER — SODIUM CHLORIDE 0.9 % IJ SOLN
10.0000 mL | INTRAMUSCULAR | Status: DC | PRN
  Administered 2012-09-26: 10 mL
  Filled 2012-09-26: qty 10

## 2012-09-26 MED ORDER — HEPARIN SOD (PORK) LOCK FLUSH 100 UNIT/ML IV SOLN
500.0000 [IU] | Freq: Once | INTRAVENOUS | Status: AC | PRN
  Administered 2012-09-26: 500 [IU]
  Filled 2012-09-26: qty 5

## 2012-09-26 NOTE — Progress Notes (Signed)
Tolerated chemo well.  Urged to call clinic with questions, concerns.

## 2012-09-27 ENCOUNTER — Encounter (HOSPITAL_BASED_OUTPATIENT_CLINIC_OR_DEPARTMENT_OTHER): Payer: Medicaid Other

## 2012-09-27 VITALS — BP 136/75 | HR 77 | Temp 98.3°F | Resp 16

## 2012-09-27 DIAGNOSIS — C50919 Malignant neoplasm of unspecified site of unspecified female breast: Secondary | ICD-10-CM

## 2012-09-27 DIAGNOSIS — C50419 Malignant neoplasm of upper-outer quadrant of unspecified female breast: Secondary | ICD-10-CM

## 2012-09-27 DIAGNOSIS — C778 Secondary and unspecified malignant neoplasm of lymph nodes of multiple regions: Secondary | ICD-10-CM

## 2012-09-27 DIAGNOSIS — C7951 Secondary malignant neoplasm of bone: Secondary | ICD-10-CM

## 2012-09-27 MED ORDER — PEGFILGRASTIM INJECTION 6 MG/0.6ML
SUBCUTANEOUS | Status: AC
  Filled 2012-09-27: qty 0.6

## 2012-09-27 MED ORDER — PEGFILGRASTIM INJECTION 6 MG/0.6ML
6.0000 mg | Freq: Once | SUBCUTANEOUS | Status: AC
  Administered 2012-09-27: 6 mg via SUBCUTANEOUS

## 2012-09-27 NOTE — Progress Notes (Signed)
Brenda Avery presents today for injection per the provider's orders.  Neulasta administered administration without incident; see MAR for injection details.  Patient tolerated procedure well and without incident.  No questions or complaints noted at this time.

## 2012-09-28 ENCOUNTER — Other Ambulatory Visit (INDEPENDENT_AMBULATORY_CARE_PROVIDER_SITE_OTHER): Payer: Self-pay | Admitting: General Surgery

## 2012-09-28 ENCOUNTER — Telehealth (INDEPENDENT_AMBULATORY_CARE_PROVIDER_SITE_OTHER): Payer: Self-pay | Admitting: General Surgery

## 2012-09-28 DIAGNOSIS — C50919 Malignant neoplasm of unspecified site of unspecified female breast: Secondary | ICD-10-CM

## 2012-09-28 DIAGNOSIS — C800 Disseminated malignant neoplasm, unspecified: Secondary | ICD-10-CM

## 2012-09-28 DIAGNOSIS — C50419 Malignant neoplasm of upper-outer quadrant of unspecified female breast: Secondary | ICD-10-CM

## 2012-09-28 NOTE — Telephone Encounter (Signed)
Called patient to advise of mri being ordered at Dr. Jacinto Halim request. Patient requested an open mri as she cannot take closed spaces. Patient to be scheduled for the mri early next week as there is a chance the breast center may not be open tomorrow due to weather and a chance of icy roads on Friday. Patient agreed and stated she did not want to be driving in bad weather.

## 2012-10-03 ENCOUNTER — Other Ambulatory Visit (HOSPITAL_COMMUNITY): Payer: Self-pay | Admitting: Oncology

## 2012-10-03 ENCOUNTER — Telehealth (HOSPITAL_COMMUNITY): Payer: Self-pay | Admitting: *Deleted

## 2012-10-03 DIAGNOSIS — K219 Gastro-esophageal reflux disease without esophagitis: Secondary | ICD-10-CM

## 2012-10-03 MED ORDER — OMEPRAZOLE 20 MG PO CPDR: 20.0000 mg | DELAYED_RELEASE_CAPSULE | Freq: Every day | ORAL | Status: DC

## 2012-10-03 NOTE — Telephone Encounter (Signed)
Patient called c/o heartburn/belching and would like something called to [facility]

## 2012-10-04 ENCOUNTER — Inpatient Hospital Stay (HOSPITAL_COMMUNITY): Payer: Self-pay

## 2012-10-04 ENCOUNTER — Ambulatory Visit
Admission: RE | Admit: 2012-10-04 | Discharge: 2012-10-04 | Disposition: A | Discharge status: Home / Self Care | Payer: Medicaid Other | Source: Ambulatory Visit | Attending: General Surgery | Admitting: General Surgery

## 2012-10-05 ENCOUNTER — Encounter (HOSPITAL_COMMUNITY): Payer: Self-pay | Admitting: Oncology

## 2012-10-05 ENCOUNTER — Encounter (HOSPITAL_BASED_OUTPATIENT_CLINIC_OR_DEPARTMENT_OTHER): Payer: Medicaid Other | Admitting: Oncology

## 2012-10-05 VITALS — BP 121/71 | HR 94 | Temp 98.8°F | Resp 16 | Wt 185.4 lb

## 2012-10-05 DIAGNOSIS — C50919 Malignant neoplasm of unspecified site of unspecified female breast: Secondary | ICD-10-CM

## 2012-10-05 DIAGNOSIS — B370 Candidal stomatitis: Secondary | ICD-10-CM

## 2012-10-05 MED ORDER — FIRST-DUKES MOUTHWASH MT SUSP: 5.0000 mL | Freq: Four times a day (QID) | OROMUCOSAL | Status: DC | PRN

## 2012-10-05 NOTE — Progress Notes (Signed)
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 female returns for  regular  visit for followup of Advanced left-sided breast cancer, infiltrating ductal type, either stage III versus stage IV. ER +100%, PR +30%, HER-2/neu negative, Ki-67 marker 34%. She has a greater than or equal to 7 cm tumor to as much is 8.5 centimeters in the upper-outer quadrant of the left breast. There are no clinically positive lymph nodes in the left axilla. She also had no positive supraclavicular, infraclavicular nodes clinically either. AND Sarcoidosis on biopsy of her liver. PET scan suggested metastatic disease in multiple sites including liver and lymph nodes of the chest, subclavicular area and possibly bony areas.   Diondra admits to nausea and vomiting of clear liquid.  She reports that she does wait to take her antiemetics and tries to fight the nausea on her own.  She reports that she is not a fan of medications.  She reports the the antiemetics are effective when she takes them.  I provided her education regarding her antiemetic regimen.  She was educated on taking the antiemetics at the first sign of nausea.   Brenda Avery notes a sore throat.  Physical exam show oral candidiasis.  I will escribe her Duke's Magic Mouthwash and she was educated on the proper administration of this medication.  She was given education regarding oral candidiasis.  Brenda Avery has a few questions about her scan.  I personally reviewed and went over radiographic studies with the patient.  She was informed that the size of her tumor is equal to or greater than 7 cm and potentially up to 8.5 cm in size.  We reviewed her PET scan.  All questions were answered.  She understands that she has Stage III versus Stage IV disease.   She was to have an MRI of her breast  performed and that had to be cancelled due to nausea while she was on the table to have the procedure done.  This was rescheduled to this coming Friday.  She was informed to take 1 Zofran 5 hours prior to the imaging study and an Ativan if needed SL prior to the imaging.   Otherwise, she tolerated her 1st cycle well.  She denies any bone pain from the Neulasta injection.   Oncologically, she otherwise is doing well without complaints and ROS questioning is negative.     Past Medical History  Diagnosis Date  . Hyperlipidemia     diet therapy per patient request  . 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 Hematuria 2000/2001    evaluted in [place] with negative results  . Breast CA     left  . 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     is allergic to metronidazole and sulfonamide derivatives.  Ms. Wootan does not currently have medications on file.  Past Surgical History  Procedure Laterality Date  . Tubal ligation  2004  . Rectocele repair  2004  . Cystoscopy  2001  . Excision of facial nevi  2004  . Breast biopsy  2014  .  Liver biopsy    . Portacath placement  09/20/2012    Procedure: INSERTION PORT-A-CATH;  Surgeon: Ernestene Mention, MD;  Location: [facility] OR;  Service: General;  Laterality: N/A;  port a cath insertion with flouro and ultrasound     Denies any headaches, dizziness, double vision, fevers, chills, night sweats, nausea, vomiting, diarrhea, constipation, chest pain, heart palpitations, shortness of breath, blood in stool, black tarry stool, urinary pain, urinary burning, urinary frequency, hematuria.   PHYSICAL EXAMINATION  ECOG PERFORMANCE STATUS: 1 - Symptomatic but completely ambulatory  Filed Vitals:    10/05/12 1400  BP: 121/71  Pulse: 94  Temp: 98.8 F (37.1 C)  Resp: 16    GENERAL:alert, no distress, well nourished, well developed, comfortable, cooperative, obese and smiling SKIN: skin color, texture, turgor are normal, no rashes or significant lesions HEAD: Normocephalic, No masses, lesions, tenderness or abnormalities EYES: normal, Conjunctiva are pink and non-injected EARS: External ears normal OROPHARYNX:thrush  NECK: supple, no adenopathy, thyroid normal size, non-tender, without nodularity, no stridor, non-tender, trachea midline LYMPH:  no palpable lymphadenopathy, no hepatosplenomegaly BREAST:abnormal mass palpable in left breast in the upper outer quadrant measuring 8 cm clinically.  This mass is deep.  Right breast exam is unremarkable without a palpable mass. LUNGS: clear to auscultation and percussion HEART: regular rate & rhythm, no murmurs, no gallops, S1 normal and S2 normal ABDOMEN:abdomen soft, non-tender, obese, normal bowel sounds, no masses or organomegaly and no hepatosplenomegaly BACK: Back symmetric, no curvature., No CVA tenderness EXTREMITIES:less then 2 second capillary refill, no joint deformities, effusion, or inflammation, no edema, no skin discoloration, no clubbing, no cyanosis  NEURO: alert & oriented x 3 with fluent speech, no focal motor/sensory deficits, gait normal    LABORATORY DATA: CBC    Component Value Date/Time   WBC 5.4 09/26/2012 0937   RBC 4.30 09/26/2012 0937   HGB 11.4* 09/26/2012 0937   HCT 33.8* 09/26/2012 0937   PLT 260 09/26/2012 0937   MCV 78.6 09/26/2012 0937   MCH 26.5 09/26/2012 0937   MCHC 33.7 09/26/2012 0937   RDW 14.0 09/26/2012 0937   LYMPHSABS 0.7 09/26/2012 0937   MONOABS 0.6 09/26/2012 0937   EOSABS 0.2 09/26/2012 0937   BASOSABS 0.0 09/26/2012 0937      Chemistry      Component Value Date/Time   NA 138 09/26/2012 0937   K 3.1* 09/26/2012 0937   CL 100 09/26/2012 0937   CO2 27 09/26/2012 0937   BUN 9 09/26/2012 0937    CREATININE 0.76 09/26/2012 0937      Component Value Date/Time   CALCIUM 9.3 09/26/2012 0937   ALKPHOS 118* 09/26/2012 0937   AST 20 09/26/2012 0937   ALT 22 09/26/2012 0937   BILITOT 0.7 09/26/2012 0937     Lab Results  Component Value Date   LABCA2 276* 09/26/2012     RADIOGRAPHIC STUDIES:  09/14/2012  *RADIOLOGY REPORT*  Clinical Data: Initial treatment strategy for breast cancer.  NUCLEAR MEDICINE PET SKULL BASE TO THIGH  Fasting Blood Glucose: 102  Technique: 18.3 mCi F-18 FDG was injected intravenously. CT data  was obtained and used for attenuation correction and anatomic  localization only. (This was not acquired as a diagnostic CT  examination.) Additional exam technical data entered on  technologist worksheet.  Comparison: 08/04/2012  Findings:  Neck: Focus of hypermetabolic activity in the right supraclavicular  region likely corresponds to a lymph node with short axis diameter  6 mm. Maximum standard uptake value 7.6.  Chest: Left subpectoral node short axis diameter 1.4 cm, maximum  standard uptake value 3.8.  Left upper breast mass maximum standard uptake value 6.8.  Contralateral breast glandular tissue has a maximum standard uptake  value of 2.6.  Prevascular adenopathy maximum standard uptake value 6.2.  Conglomerate left hilar and suprahilar adenopathy maximum standard  uptake value 10.0. Right hilar and suprahilar conglomerate  adenopathy maximum standard uptake value 8.7. Subcarinal maximum  standard uptake value 9.1. Small bilateral pleural effusions are  present, indeterminate for malignant involvement. Slight  nodularity along the major fissure inferiorly could represent  pleural fluid and does not appear hypermetabolic.  Abdomen/Pelvis: Hypermetabolic activity noted in the liver  throughout the left hepatic lobe, and portions of the right hepatic  lobe, and in the porta hepatis. Conglomerate porta hepatis and  adjacent hepatic activity maximum  standard uptake value 19.3. Left  hepatic lobe activity maximum standard uptake value 16.6.  Miss registered activity in the spleen appears mildly  heterogeneous, making it difficult to exclude splenic involvement.  A hypermetabolic right external iliac lymph node measures 7 mm in  diameter but has a maximum standard uptake value of 6.7, compatible  with malignancy. Small malignant bilateral inguinal lymph nodes  are present, maximum standard uptake value of 7.8 on the right and  7.4 on the left.  Skeleton: High activity in the vicinity of the manubrial sternal  junction is observed, maximum standard uptake value 4.9,  corresponding to lytic lesion. Sclerosis along the iliac sides of  the sacroiliac joints favoring osteitis condensans ilii. There is  also sclerosis in the right pubic body, without hypermetabolic  activity.  IMPRESSION:  1. Hypermetabolic left breast mass with metastatic disease to the  right supraclavicular region, sternum, hila and mediastinum, liver,  porta hepatis, right external iliac and bilateral inguinal lymph  nodes, and possibly the spleen.  2. Osteitis condensans ilii with osteitis pubis.  Original Report Authenticated By: Gaylyn Rong, M.D.     PATHOLOGY: 09/19/2012  REASON FOR ADDENDUM, AMENDMENT OR CORRECTION: [id number]: Stains performed; addendum issued kh 09/22/12 02:24:32 PM FINAL DIAGNOSIS Diagnosis Liver, needle/core biopsy, left - GRANULOMATOUS INFLAMMATION, NON-NECROTIZING. - NO MALIGNANCY IDENTIFIED. Microscopic Comment There are multiple, predominantly portal based non-necrotizing granulomas and the pattern suggests sarcoidosis. Clinical correlation is suggested. Special stains for microorganisms will be performed and reported as an addendum. The case was discussed with Dr. Mariel Sleet on 09/20/12. (JDP:kh 09/20/12) ADDENDUM: Special stains are performed and no fungi are identified with PAS or GMS stains and no acid-fast bacilli are  identified with AFB stain. (JDP:kh 09/22/12) Jimmy Picket MD Pathologist, Electronic Signature (Case signed 09/22/2012)    08/19/2012  1. PROGNOSTIC INDICATORS - ACIS Results IMMUNOHISTOCHEMICAL AND MORPHOMETRIC ANALYSIS BY THE AUTOMATED CELLULAR IMAGING SYSTEM (ACIS) Estrogen Receptor (Negative, <1%): 100%, STRONG STAINING INTENSITY Progesterone Receptor (Negative, <1%): 0%, NEGATIVE COMMENT: The negative hormone receptor study(ies) in this case has an internal positive control. All controls stained appropriately Jimmy Picket MD Pathologist, Electronic Signature ( Signed 08/26/2012) 2. PROGNOSTIC INDICATORS - ACIS Results IMMUNOHISTOCHEMICAL AND MORPHOMETRIC ANALYSIS BY THE AUTOMATED CELLULAR IMAGING SYSTEM (ACIS) Estrogen Receptor (Negative, <1%): 100%, STRONG STAINING INTENSITY Progesterone Receptor (Negative, <1%): 30%, MODERATE STAINING INTENSITY Proliferation Marker Ki67 by M IB-1 (Low<20%): 34% All controls stained appropriately Jimmy Picket MD Pathologist, Electronic Signature ( Signed 08/26/2012) 1 of 3 FINAL for Brenda Avery, Brenda Avery ([id number]) ADDITIONAL INFORMATION:(continued) 2. CHROMOGENIC IN-SITU HYBRIDIZATION Interpretation HER-2/NEU BY CISH - NO AMPLIFICATION OF HER-2 DETECTED. THE RATIO OF HER-2: CEP 17 SIGNALS WAS  1.06. Reference range: Ratio: HER2:CEP17 < 1.8 - gene amplification not observed Ratio: HER2:CEP 17 1.8-2.2 - equivocal result Ratio: HER2:CEP17 > 2.2 - gene amplification observed Jimmy Picket MD Pathologist, Electronic Signature ( Signed 08/26/2012) FINAL DIAGNOSIS Diagnosis 1. 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 - SEE COMMENT. 2. Breast, left, needle core biopsy, mass, UOQ - INVASIVE MAMMARY CARCINOMA. - MAMMARY CARCINOMA IN SITU. - SEE COMMENT. Microscopic Comment 1. Immunohistochemical stains are performed on the first needle core biopsy from the left upper outer quadrant. An  Avery-cadherin stain highlights the areas of epithelial proliferation while a cytokeratin 5/6 is negative in the central aspect of the areas of epithelial proliferation. The morphology coupled with the immunohistochemical staining pattern is consistent with the above diagnosis. Although grade of ductal carcinoma in situ is best done on an excision specimen, the features are intermediate to high grade. A quantitative estrogen and progesterone receptor will be performed and reported in an addendum. The findings are called to [facility] on 08/23/12. Dr. Colonel Bald has seen this case in consultation with agreement. 2. In the second left upper outer quadrant biopsy there is an area that is suspicious for lymphovascular invasion. Although definitive grading is best done on excision specimen, the features of the tumor in the second left upper outer quadrant biopsy favor a grade I to II carcinoma. Morphologic features on the biopsy also favor a ductal carcinoma, although definitive typing will be reserved for excision specimen. A breast prognostic profile will be performed and reported in an addendum. The findings are called to [facility] on 08/23/12. Dr. Colonel Bald has seen the secon left upper outer quadrant biopsy in consultation with agreement. (RAH:caf 08/22/12) Zandra Abts MD Pathologist, Electronic Signature (Case signed 08/23/2012)     ASSESSMENT:  1. Advanced left-sided breast cancer, infiltrating ductal type, either stage III versus stage IV. ER +100%, PR +30%, HER-2/neu negative, Ki-67 marker 34%. She has a greater than or equal to 7 cm tumor to as much is 8.5 centimeters in the upper-outer quadrant of the left breast. There are no clinically positive lymph nodes in the left axilla. She also had no positive supraclavicular, infraclavicular nodes clinically either. Now on systemic chemotherapy. 2. Sarcoidosis on biopsy of her liver. PET scan suggested metastatic disease  in multiple sites including liver and lymph nodes of the chest, subclavicular area and possibly bony areas.  3. Excess weight  4. Sulfonamide allergy, metronidazole allergy 5. Oral candidiasis.   PLAN:  1. I personally reviewed and went over laboratory results with the patient. 2. Pre-chemo labs ordered as a standing order: CBC diff, CMET 3. CA 27.29 every 4 weeks.  4. Continue on with cycle 2 of AC as scheduled.  5. Patient education regarding taking anti-emetics at the first sign of nausea.  Ativan may be taken SL. 6. I personally reviewed and went over radiographic studies with the patient. 7. Duke's Magic mouthwash, Avery-scribed. 8. Patient education regarding oral candidiasis 9. MRI breast rescheduled to Friday. 10. Patient encouraged to take Prilosec 20 mg daily.  11. Return in 2 weeks for follow-up.    All questions were answered. The patient knows to call the clinic with any problems, questions or concerns. We can certainly see the patient much sooner if necessary.  The patient and plan discussed with Glenford Peers, MD and he is in agreement with the aforementioned.  I spent 25 minutes counseling the patient face to face.  The total time spent in the appointment was 30 minutes.  KEFALAS,THOMAS

## 2012-10-05 NOTE — Patient Instructions (Addendum)
[facility] Discharge Instructions  RECOMMENDATIONS MADE BY THE CONSULTANT AND ANY TEST RESULTS WILL BE SENT TO YOUR REFERRING PHYSICIAN.  EXAM FINDINGS BY THE PHYSICIAN TODAY AND SIGNS OR SYMPTOMS TO REPORT TO CLINIC OR PRIMARY PHYSICIAN: Exam and discussion by PA.  You have a fungal infection in your mouth and throat the is causing your discomfort.  Will give you some Duke's Mixture to use.  Also, take your nausea medication at the first sign of nausea - don't wait.  MEDICATIONS PRESCRIBED:  Duke's Magic Mouthwash 16ml/1 teaspoon to swish and swallow 4 times daily.  INSTRUCTIONS GIVEN AND DISCUSSED: Report uncontrolled nausea, vomiting, fevers, or other problems.  SPECIAL INSTRUCTIONS/FOLLOW-UP: Chemo as scheduled and Follow-up in 2 weeks.  Thank you for choosing [facility] to provide your oncology and hematology care.  To afford each patient quality time with our providers, please arrive at least 15 minutes before your scheduled appointment time.  With your help, our goal is to use those 15 minutes to complete the necessary work-up to ensure our physicians have the information they need to help with your evaluation and healthcare recommendations.    Effective January 1st, 2014, we ask that you re-schedule your appointment with our physicians should you arrive 10 or more minutes late for your appointment.  We strive to give you quality time with our providers, and arriving late affects you and other patients whose appointments are after yours.    Again, thank you for choosing [facility].  Our hope is that these requests will decrease the amount of time that you wait before being seen by our physicians.       _____________________________________________________________  Should you have questions after your visit to [facility], please contact our office at [phone] between the hours of 8:30 a.m. and 5:00 p.m.  Voicemails  left after 4:30 p.m. will not be returned until the following business day.  For prescription refill requests, have your pharmacy contact our office with your prescription refill request.

## 2012-10-07 ENCOUNTER — Ambulatory Visit
Admission: RE | Admit: 2012-10-07 | Discharge: 2012-10-07 | Disposition: A | Discharge status: Home / Self Care | Payer: Medicaid Other | Source: Ambulatory Visit | Attending: General Surgery | Admitting: General Surgery

## 2012-10-07 MED ORDER — GADOBENATE DIMEGLUMINE 529 MG/ML IV SOLN
15.0000 mL | Freq: Once | INTRAVENOUS | Status: AC | PRN
  Administered 2012-10-07: 15 mL via INTRAVENOUS

## 2012-10-10 ENCOUNTER — Encounter (HOSPITAL_BASED_OUTPATIENT_CLINIC_OR_DEPARTMENT_OTHER): Payer: Medicaid Other

## 2012-10-10 VITALS — BP 133/76 | HR 90 | Temp 97.8°F | Resp 18 | Wt 186.6 lb

## 2012-10-10 DIAGNOSIS — C50419 Malignant neoplasm of upper-outer quadrant of unspecified female breast: Secondary | ICD-10-CM

## 2012-10-10 DIAGNOSIS — C7951 Secondary malignant neoplasm of bone: Secondary | ICD-10-CM

## 2012-10-10 DIAGNOSIS — C50919 Malignant neoplasm of unspecified site of unspecified female breast: Secondary | ICD-10-CM

## 2012-10-10 LAB — CBC WITH DIFFERENTIAL/PLATELET
Basophils Absolute: 0 10*3/uL (ref 0.0–0.1)
Eosinophils Relative: 1 % (ref 0–5)
Lymphocytes Relative: 14 % (ref 12–46)
Monocytes Relative: 14 % — ABNORMAL HIGH (ref 3–12)
Platelets: 298 10*3/uL (ref 150–400)
RBC: 4.25 MIL/uL (ref 3.87–5.11)
RDW: 14.3 % (ref 11.5–15.5)
WBC: 4.4 10*3/uL (ref 4.0–10.5)

## 2012-10-10 LAB — COMPREHENSIVE METABOLIC PANEL
ALT: 31 U/L (ref 0–35)
AST: 24 U/L (ref 0–37)
Albumin: 3.3 g/dL — ABNORMAL LOW (ref 3.5–5.2)
Calcium: 9.5 mg/dL (ref 8.4–10.5)
GFR calc Af Amer: >90 mL/min (ref >90)
Potassium: 3.8 mEq/L (ref 3.5–5.1)
Sodium: 138 mEq/L (ref 135–145)
Total Protein: 7.5 g/dL (ref 6.0–8.3)

## 2012-10-10 MED ORDER — FOSAPREPITANT DIMEGLUMINE INJECTION 150 MG: 150.0000 mg | Freq: Once | INTRAVENOUS | Status: DC

## 2012-10-10 MED ORDER — SODIUM CHLORIDE 0.9 % IV SOLN
600.0000 mg/m2 | Freq: Once | INTRAVENOUS | Status: AC
  Administered 2012-10-10: 1160 mg via INTRAVENOUS
  Filled 2012-10-10: qty 58

## 2012-10-10 MED ORDER — HEPARIN SOD (PORK) LOCK FLUSH 100 UNIT/ML IV SOLN
INTRAVENOUS | Status: AC
  Filled 2012-10-10: qty 5

## 2012-10-10 MED ORDER — SODIUM CHLORIDE 0.9 % IV SOLN
Freq: Once | INTRAVENOUS | Status: AC
  Administered 2012-10-10: 12:00:00 via INTRAVENOUS

## 2012-10-10 MED ORDER — DOXORUBICIN HCL CHEMO IV INJECTION 2 MG/ML
60.0000 mg/m2 | Freq: Once | INTRAVENOUS | Status: AC
  Administered 2012-10-10: 116 mg via INTRAVENOUS
  Filled 2012-10-10: qty 58

## 2012-10-10 MED ORDER — PALONOSETRON HCL INJECTION 0.25 MG/5ML
INTRAVENOUS | Status: AC
  Filled 2012-10-10: qty 5

## 2012-10-10 MED ORDER — DEXAMETHASONE SODIUM PHOSPHATE 4 MG/ML IJ SOLN: 12.0000 mg | Freq: Once | INTRAMUSCULAR | Status: DC

## 2012-10-10 MED ORDER — ACETAMINOPHEN 325 MG PO TABS
650.0000 mg | ORAL_TABLET | Freq: Four times a day (QID) | ORAL | Status: DC | PRN
  Administered 2012-10-10: 650 mg via ORAL

## 2012-10-10 MED ORDER — PALONOSETRON HCL INJECTION 0.25 MG/5ML
0.2500 mg | Freq: Once | INTRAVENOUS | Status: AC
  Administered 2012-10-10: 0.25 mg via INTRAVENOUS

## 2012-10-10 MED ORDER — ACETAMINOPHEN 325 MG PO TABS
ORAL_TABLET | ORAL | Status: AC
  Filled 2012-10-10: qty 2

## 2012-10-10 MED ORDER — SODIUM CHLORIDE 0.9 % IV SOLN
Freq: Once | INTRAVENOUS | Status: AC
  Administered 2012-10-10: 13:00:00 via INTRAVENOUS
  Filled 2012-10-10: qty 5

## 2012-10-10 NOTE — Progress Notes (Signed)
C/O headache and light headedness after cytoxan. VSS and tylenol given for headache. 1508 headache better, no further  C/o.

## 2012-10-11 ENCOUNTER — Encounter (HOSPITAL_BASED_OUTPATIENT_CLINIC_OR_DEPARTMENT_OTHER): Payer: Medicaid Other

## 2012-10-11 VITALS — BP 151/79 | HR 78 | Temp 97.1°F | Resp 18

## 2012-10-11 DIAGNOSIS — C50419 Malignant neoplasm of upper-outer quadrant of unspecified female breast: Secondary | ICD-10-CM

## 2012-10-11 DIAGNOSIS — C7951 Secondary malignant neoplasm of bone: Secondary | ICD-10-CM

## 2012-10-11 MED ORDER — PEGFILGRASTIM INJECTION 6 MG/0.6ML
6.0000 mg | Freq: Once | SUBCUTANEOUS | Status: AC
  Administered 2012-10-11: 6 mg via SUBCUTANEOUS

## 2012-10-11 MED ORDER — PEGFILGRASTIM INJECTION 6 MG/0.6ML
SUBCUTANEOUS | Status: AC
  Filled 2012-10-11: qty 0.6

## 2012-10-11 NOTE — Progress Notes (Signed)
Brenda Avery presents today for injection per MD orders. Neulasta 6mg  administered SQ in right Abdomen. Administration without incident. Patient tolerated well.

## 2012-10-12 ENCOUNTER — Telehealth (INDEPENDENT_AMBULATORY_CARE_PROVIDER_SITE_OTHER): Payer: Self-pay | Admitting: General Surgery

## 2012-10-12 NOTE — Telephone Encounter (Signed)
Called patient and advised that Dr. Derrell Lolling wanted to see her after consulting with Dr. Mariel Sleet. Patient stated that she will call back after talking to her husband when he gets home to see what date works best.   Per Dr. Derrell Lolling this patient will need a 30 minute appointment as soon as available. Advised the patient if she cannot reach me to ask for any of the nurses in triage to set her with the appointment. Patient agreed.

## 2012-10-18 ENCOUNTER — Encounter (INDEPENDENT_AMBULATORY_CARE_PROVIDER_SITE_OTHER): Payer: Self-pay | Admitting: General Surgery

## 2012-10-18 ENCOUNTER — Ambulatory Visit (INDEPENDENT_AMBULATORY_CARE_PROVIDER_SITE_OTHER): Payer: Medicaid Other | Admitting: General Surgery

## 2012-10-18 VITALS — BP 134/90 | HR 76 | Temp 97.9°F | Resp 16 | Ht 64.0 in | Wt 185.8 lb

## 2012-10-18 DIAGNOSIS — C50919 Malignant neoplasm of unspecified site of unspecified female breast: Secondary | ICD-10-CM

## 2012-10-18 DIAGNOSIS — C800 Disseminated malignant neoplasm, unspecified: Secondary | ICD-10-CM

## 2012-10-18 DIAGNOSIS — C50912 Malignant neoplasm of unspecified site of left female breast: Secondary | ICD-10-CM

## 2012-10-18 NOTE — Progress Notes (Signed)
Patient ID: Brenda Avery, female   DOB: [birth year], 42 y.o.   MRN: [id number] History: This patient returns for further discussion about her breast cancer. She has had an MRI in the interim which suggests more extensive disease in the left and 2 masses in the right breast, which were not suspected previously. She has received 2 cycles of chemotherapy and that has gone well. She will receive another cycle and they will be changed to another regimen for 12 weeks. Once the liver biopsy showed sarcoidosis, it became apparent that it is possible she does not have stage IV disease. We went ahead with bilateral breast MRI at that time, knowing that this was not baseline since she had received her first cycle of chemo.   This showed marked bilateral parenchymal enhancement. 2 biopsy marker clips were seen on the left but there was extensive breast cancer involving multiple quadrants on the left, left upper outer quadrant and left lower outer quadrant and also suspicious for chest wall involvement, possibly or possible axillary node involvement and sternal metastasis. Hopefully this is sarcoid. The right breast showed 2 small masses, 18 mm and 9 mm at the 10:00 position. To biopsy the right side an ultrasound would need to be performed, and possibly MRI biopsy would need to be performed.   ROS: 10 system review of systems is negative except as described above.  Exam: Patient looks well. No distress. Husband is with her throughout the encounter Neck: No adenopathy or mass. Port-A-Cath right internal jugular vein well-healed Lungs: Clear to auscultation bilaterally. Port in right infraclavicular area looks fine. Heart: Regular rate and rhythm. No murmur. No ectopy Breasts: Very large. Symmetrical. Palpable mass left upper outer quadrant seems smaller, but still at least 6 cm. Possibly this is responding. I think I can feel some slightly enlarged mobile left axillary lymph nodes. I do not feel a mass in the lower  outer quadrant left. Right breast is large. No obvious dominant masses or axillary adenopathy.  Assessment: Advanced left-sided breast cancer, infiltrating ductal type, either stage III versus stage IV, receptor positive, HER-2 negative, he 67 marker 34%. Status post 2 cycles of neoadjuvant chemotherapy, with some clinical response suspected by physical exam MRI suggesting probable multi- quadrant disease on left, possible axillary metastasis on left, and 2 small masses in right breast upper outer quadrant. Genetic testing obtained, results pending  sarcoidosis on biopsy of liver.  Plan: Very, very long discussion with patient and husband We talked about different strategies for determining the extent of disease including biopsy of right breast, biopsy left breast lower outer quadrant. We talk about the possibility of false negative now that she's had chemotherapy. We talked about her attitude and opinions about extent of surgery. She is strongly leaning toward bilateral mastectomies with reconstruction. We talked about reconstructive issues. I told her with that we would involve a plastic surgeon in  consultation before we ever did any definitive breast cancer surgery I told her they might decide to do a delayed reconstruction because of the need for radiation therapy on the left side. At the end of this long conversation we decided that we would not schedule any biopsies at this time, unless she changes her mind. She will return to see me in 6 weeks to be sure that she is responding to the neoadjuvant chemotherapy Assuming she continues to respond, we would plan end of treatment MRI.    Brenda Avery. Derrell Lolling, M.D., [facility] General and Minimally  invasive Surgery Breast and Colorectal Surgery Office:   [phone] Pager:   [phone]

## 2012-10-18 NOTE — Patient Instructions (Signed)
We have talked about your MRI which shows more extensive disease in the left breast, left upper quadrant and left lower quadrant and possible lymph node metastasis. The MRI also shows 2 small masses in the right breast which may be cancerous as well.  Exam today suggests that the cancer on the left side is getting smaller.  We have talked about  biopsying the areas on the right and on the left to proved extent of disease. We have also talked about simply  going ahead with bilateral mastectomy after  you complete your chemotherapy., which appears to be your preference at this time, pending further thoughts.  We have talked about reconstruction following bilateral mastectomy.  Return to see Dr. Derrell Lolling in 6 weeks for further exam and discussion.  Call Dr. Derrell Lolling if you change your mind and would like these areas biopsied  Eventually you will be referred to a plastic surgeon before any breast surgery is done.

## 2012-10-18 NOTE — Progress Notes (Signed)
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 female returns for  regular  visit for followup of Advanced left-sided breast cancer, infiltrating ductal type, either stage III versus stage IV. ER +100%, PR +30%, HER-2/neu negative, Ki-67 marker 34%. She has a greater than or equal to 7 cm tumor to as much is 8.5 centimeters in the upper-outer quadrant of the left breast. There are no clinically positive lymph nodes in the left axilla. She also had no positive supraclavicular, infraclavicular nodes clinically either. AND Sarcoidosis on biopsy of her liver. PET scan suggested metastatic disease in multiple sites including liver and lymph nodes of the chest, subclavicular area and possibly bony areas.   Brenda Avery is here for followup following cycle 2 of Adriamycin and Cytoxan chemotherapy. She reports that her nausea was much better after this cycle and better controlled after our discussion on our last encounter. She did not know that she can take her Ativan in conjunction with her other anti-emetics and this was corrected for her today and she was educated upon this.  She admits to following chemotherapy she consumed a Friday finished in which and this caused little bit of nausea. She was educated on avoiding some of her favor foods, greasy foods, and spicy foods around a timeframe of chemotherapy administration. She was educated this can increase her nausea.   She started her menses cycle on 10/13/2012. Interestingly, she admits to a little bit more fatigue and this may be associated with her menstrual cycle.  She is scheduled for cycle 3 of Adriamycin and Cytoxan on 10/24/2012. We'll move on with cycle 3 as scheduled.  She also has a followup 2-D echocardiogram on 11/02/2012.  She was seen by Dr. Derrell Lolling and the conclusion that  visit was for her to have bilateral mastectomies. We support that decision. She was to make a 6 week followup appointment and she has held off on that because she does not know when she will be receiving chemotherapy after her Adriamycin Cytoxan x4 cycles is complete. I explained her to call Dr. Jacinto Halim office and set the appointment. We can certainly administer chemotherapy around that appointment date.  I suspect we will be doing paclitaxel chemotherapy on Mondays. I informed the patient of this as well.  Oncologically, the patient denies any complaints and ROS questioning is negative.  Past Medical History  Diagnosis Date  . Hyperlipidemia     diet therapy per patient request  . 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 Hematuria 2000/2001    evaluted in [place] with negative results  . Breast CA     left  . 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     is allergic to metronidazole and sulfonamide derivatives.  Brenda Avery does not currently have medications on file.  Past Surgical History  Procedure Laterality Date  . Tubal ligation  2004  . Rectocele repair  2004  . Cystoscopy  2001  . Excision of facial nevi  2004  . Breast biopsy  2014  . Liver biopsy    . Portacath placement  09/20/2012    Procedure: INSERTION PORT-A-CATH;  Surgeon: Ernestene Mention, MD;  Location: [facility] OR;  Service: General;  Laterality: N/A;  port a cath insertion with flouro and ultrasound     Denies any headaches, dizziness, double vision, fevers, chills, night sweats, nausea, vomiting, diarrhea, constipation, chest pain, heart palpitations, shortness of breath, blood in stool, black tarry stool, urinary pain, urinary burning, urinary frequency,  hematuria.   PHYSICAL EXAMINATION  ECOG PERFORMANCE STATUS: 1 - Symptomatic but completely ambulatory  Filed Vitals:   10/19/12 1451  BP: 129/81  Pulse: 100  Temp: 98.7 F (37.1 C)  Resp: 16    GENERAL:alert, no distress, well nourished, well developed, comfortable, cooperative, obese, smiling and alopecia SKIN: skin color, texture, turgor are normal, no rashes or significant lesions HEAD: Normocephalic, No masses, lesions, tenderness or abnormalities EYES: normal, Conjunctiva are pink and non-injected EARS: External ears normal OROPHARYNX:mucous membranes are moist  NECK: supple, no adenopathy, trachea midline LYMPH:  no palpable lymphadenopathy BREAST:not examined LUNGS: clear to auscultation and percussion HEART: regular rate & rhythm, no murmurs, no gallops, S1 normal and S2 normal ABDOMEN:abdomen soft, non-tender, obese and normal bowel sounds BACK: Back symmetric, no curvature., No CVA tenderness EXTREMITIES:less then 2 second capillary refill, no joint deformities, effusion, or inflammation, no edema, no skin discoloration, no clubbing, no cyanosis  NEURO: alert & oriented x 3 with fluent speech, no focal motor/sensory deficits, gait normal   LABORATORY DATA: CBC    Component Value Date/Time   WBC 4.4 10/10/2012 1035   RBC 4.25 10/10/2012 1035   HGB 11.2* 10/10/2012 1035   HCT 33.6* 10/10/2012 1035   PLT 298 10/10/2012 1035   MCV 79.1 10/10/2012 1035   MCH 26.4 10/10/2012 1035   MCHC 33.3 10/10/2012 1035   RDW 14.3 10/10/2012 1035   LYMPHSABS 0.6* 10/10/2012 1035   MONOABS 0.6 10/10/2012 1035   EOSABS 0.0 10/10/2012 1035   BASOSABS 0.0 10/10/2012 1035      Chemistry      Component Value Date/Time   NA 138 10/10/2012 1035   K 3.8 10/10/2012 1035   CL 104 10/10/2012 1035   CO2 24 10/10/2012 1035   BUN 9 10/10/2012 1035   CREATININE 0.71 10/10/2012 1035      Component Value Date/Time   CALCIUM 9.5 10/10/2012 1035   ALKPHOS 126* 10/10/2012 1035   AST 24 10/10/2012 1035    ALT 31 10/10/2012 1035   BILITOT 0.1* 10/10/2012 1035      Lab Results  Component Value Date   LABCA2 276* 09/26/2012   RADIOLOGY:  10/07/2012  *RADIOLOGY REPORT*  Clinical Data: known diagnosis of extensive dcis left breast in  conjunction with invasive mammary carcinoma left breast, and known  metastasis, for treatment planning  BILATERAL BREAST MRI WITH AND WITHOUT CONTRAST  Technique: Multiplanar, multisequence MR images of both breasts  were obtained prior to and following the intravenous administration  of 15ml of multihance. Three dimensional images were evaluated at  the independent DynaCad workstation.  Comparison: recent mammogram performed 08/04/12  Findings: There is marked bilateral parenchymal enhancment. On the  left, there is a hypervascular mass occupying the upper outer and  lower outer quadrants, demontrating a mixture of enhancement  kinetics including washout kinetics, and measuring 11 (AP) x 5.5  (lateral) x 7.5 (sagittal)cm. There are two biopsy marker clips on  the left, one toward the posterolateral aspect of the mass in the 2  o'clock position, and the other toward the posterolateral aspect  of  the mass in the 3:30 position. Mass extends from the 1 o'clock  through 5 o'clock position of the left breast. There are numerous  small satellite enhancing lesions along the anterior edge of the  mass, the largest measuring 1cm. The furthest lesion measures less  than 1cm and is located 3.5cm anteromedial to the mass. Abnormal  enhancement extends back to the chest wall with tenting of left  pectoralis musculature. The lateral and medial fascia of the  tented pectoralis muscle show mild enhancment. There is no  abnormal enhancement of the nipple or skin of the left breast. The  largest left axillary lymph node measures 2.5cm in greatest  dimension and demonstrates mild to moderate cortical thickening  despite preservation of a fatty hilum. Incidental note is made  of  a 4cm simple cyst along the medial margin of the mass.  On the right, there is a microlobulated mass in the 10 o'clock  position at the junction of the anterior and middle thirds,  measuring 18 x 13 x 9mm, demonstrating primarily progressive  kinetics. 2cm anteriorly and slightly medially, there is a second  mass measuring 9mm. Incidental note is made of a 4cm simple cyst  in the 9 o'clock position, with a few scattered tiny cysts  elsewhere in the breast.  The sternum demonstrates abnormal marrow signal and enhancement,  suspicious for osseous metastasis. The known hepatic lesion is not  seen on this examination. There is no evidence of adenopathy in  the internal mammary region bilaterally or in the right axilla.  IMPRESSION:  Known extensive left breast carcinoma involving multiple quadrants,  with findings suspicious for chest wall involvement, possible  axillary lymph node involvement, and sternal metastasis.  Two masses in the right breast, which could represent neoplasia.  BI-RADS CATEGORY 6: Known biopsy-proven malignancy - appropriate  action should be taken.  RECOMMENDATION:  Second-look ultrasound is recommended to determine if the larger of  the two right breast masses is visible by ultrasound and, if so,  subsequent ultrasound-guided core needle biopsy. If the mass is  sonographically occult, MRI-guided core biopsy of the larger of the  two nearly adjacent right breast masses is recommended.  THREE-DIMENSIONAL MR IMAGE RENDERING ON INDEPENDENT WORKSTATION:  Three-dimensional MR images were rendered by post-processing of the  original MR data on an independent workstation. The three-  dimensional MR images were interpreted, and findings were reported  in the accompanying complete MRI report for this study.  Original Report Authenticated By: Brenda Avery, M.D.   PATHOLOGY: 09/19/2012  REASON FOR ADDENDUM, AMENDMENT OR CORRECTION:  [id number]: Stains  performed; addendum issued kh 09/22/12 02:24:32 PM  FINAL DIAGNOSIS  Diagnosis  Liver, needle/core biopsy, left  - GRANULOMATOUS INFLAMMATION, NON-NECROTIZING.  - NO MALIGNANCY IDENTIFIED.  Microscopic Comment  There are multiple, predominantly portal based non-necrotizing granulomas and the pattern suggests  sarcoidosis. Clinical correlation is suggested. Special stains for microorganisms will be performed and  reported as an addendum.  The case was discussed with Dr. Mariel Sleet on 09/20/12. (JDP:kh 09/20/12)  ADDENDUM: Special stains are performed and no fungi are identified with PAS or GMS stains and no  acid-fast bacilli are identified with AFB stain. (JDP:kh 09/22/12)  Brenda Picket MD  Pathologist, Electronic Signature  (Case signed 09/22/2012)  08/19/2012  1. PROGNOSTIC INDICATORS - ACIS  Results  IMMUNOHISTOCHEMICAL AND MORPHOMETRIC ANALYSIS BY THE AUTOMATED CELLULAR  IMAGING SYSTEM (ACIS)  Estrogen Receptor (Negative, <1%): 100%, STRONG STAINING INTENSITY  Progesterone Receptor (Negative, <1%): 0%, NEGATIVE  COMMENT: The negative hormone receptor study(ies) in this case has an internal positive control.  All controls stained appropriately  Brenda Picket MD  Pathologist, Electronic Signature  ( Signed 08/26/2012)  2. PROGNOSTIC INDICATORS - ACIS  Results  IMMUNOHISTOCHEMICAL AND MORPHOMETRIC ANALYSIS BY THE AUTOMATED CELLULAR  IMAGING SYSTEM (ACIS)  Estrogen Receptor (Negative, <1%): 100%, STRONG STAINING INTENSITY  Progesterone Receptor (Negative, <1%): 30%, MODERATE STAINING INTENSITY  Proliferation Marker Ki67 by M IB-1 (Low<20%): 34%  All controls stained appropriately  Brenda Picket MD  Pathologist, Electronic Signature  ( Signed 08/26/2012)  1 of 3  FINAL for Brenda Avery, Brenda Avery ([id number])  ADDITIONAL INFORMATION:(continued)  2. CHROMOGENIC IN-SITU HYBRIDIZATION  Interpretation  HER-2/NEU BY CISH - NO AMPLIFICATION OF HER-2 DETECTED. THE RATIO OF HER-2: CEP 17  SIGNALS WAS  1.06.  Reference range:  Ratio: HER2:CEP17 < 1.8 - gene amplification not observed  Ratio: HER2:CEP 17 1.8-2.2 - equivocal result  Ratio: HER2:CEP17 > 2.2 - gene amplification observed  Brenda Picket MD  Pathologist, Electronic Signature  ( Signed 08/26/2012)  FINAL DIAGNOSIS  Diagnosis  1. 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  - SEE COMMENT.  2. Breast, left, needle core biopsy, mass, UOQ  - INVASIVE MAMMARY CARCINOMA.  - MAMMARY CARCINOMA IN SITU.  - SEE COMMENT.  Microscopic Comment  1. Immunohistochemical stains are performed on the first needle core biopsy from the left upper outer  quadrant. An Avery-cadherin stain highlights the areas of epithelial proliferation while a cytokeratin 5/6 is  negative in the central aspect of the areas of epithelial proliferation. The morphology coupled with the  immunohistochemical staining pattern is consistent with the above diagnosis. Although grade of ductal  carcinoma in situ is best done on an excision specimen, the features are intermediate to high grade. A  quantitative estrogen and progesterone receptor will be performed and reported in an addendum. The  findings are called to [facility] on 08/23/12. Dr. Colonel Bald has seen this case in  consultation with agreement.  2. In the second left upper outer quadrant biopsy there is an area that is suspicious for lymphovascular  invasion. Although definitive grading is best done on excision specimen, the features of the tumor in the  second left upper outer quadrant biopsy favor a grade I to II carcinoma. Morphologic features on the biopsy  also favor a ductal carcinoma, although definitive typing will be reserved for excision specimen. A breast  prognostic profile will be performed and reported in an addendum. The findings are called to [facility] on 08/23/12. Dr. Colonel Bald has seen the secon left upper outer  quadrant biopsy in  consultation with agreement. (RAH:caf 08/22/12)  Brenda Abts MD  Pathologist, Electronic Signature  (Case signed 08/23/2012)    ASSESSMENT:  1. Advanced left-sided breast cancer, infiltrating ductal type, either stage III versus stage IV. ER +100%, PR +30%, HER-2/neu negative, Ki-67 marker 34%. She has a greater than or equal to 7 cm tumor to as much is 8.5 centimeters in the upper-outer quadrant of the left breast. There are no clinically positive lymph nodes in the left axilla. She also had no positive supraclavicular, infraclavicular nodes clinically either. Now on systemic chemotherapy.  2. Sarcoidosis on biopsy of her liver. PET scan suggested metastatic disease in multiple sites including liver and lymph nodes of the chest, subclavicular area and possibly bony areas.  3. Excess weight  4. Sulfonamide allergy, metronidazole allergy  5. Oral candidiasis, resolved.   PLAN:  1. I personally reviewed and went over laboratory results with the patient. 2. Pre-chemo labs ordered: CBC diff, CMET 3. CA 27.29 every 4 weeks 4. Continue on with cycle 3 of chemotherapy as scheduled ON 10/24/2012. 5. I personally reviewed and went over radiographic studies with the patient. 6. Agree with Mastectomies. Dr. Jacinto Halim note is appreciated.  7. 2D echo on 11/02/12. 8. Continue follow-up with Dr. Derrell Lolling as directed in 6 weeks.  9. Patient educated on anti-nausea regimen. 10.  Patient educated on avoiding particulr foods including favorite, spicy, and greasy foods near the time of chemotherapy.  11. Return as scheduled for follow-up  All questions were answered. The patient knows to call the clinic with any problems, questions or concerns. We can certainly see the patient much sooner if necessary.  Patient and plan will be discussed with Dr. Mariel Sleet within the next 24 hours.    Brenda Avery

## 2012-10-19 ENCOUNTER — Encounter (HOSPITAL_COMMUNITY): Payer: Self-pay | Admitting: Oncology

## 2012-10-19 ENCOUNTER — Encounter (HOSPITAL_COMMUNITY): Payer: Medicaid Other | Attending: Oncology | Admitting: Oncology

## 2012-10-19 VITALS — BP 129/81 | HR 100 | Temp 98.7°F | Resp 16 | Wt 188.6 lb

## 2012-10-19 DIAGNOSIS — C800 Disseminated malignant neoplasm, unspecified: Secondary | ICD-10-CM | POA: Insufficient documentation

## 2012-10-19 DIAGNOSIS — C50912 Malignant neoplasm of unspecified site of left female breast: Secondary | ICD-10-CM

## 2012-10-19 DIAGNOSIS — E669 Obesity, unspecified: Secondary | ICD-10-CM

## 2012-10-19 DIAGNOSIS — Z170 Estrogen receptor positive status [ER+]: Secondary | ICD-10-CM

## 2012-10-19 DIAGNOSIS — D869 Sarcoidosis, unspecified: Secondary | ICD-10-CM

## 2012-10-19 DIAGNOSIS — C50919 Malignant neoplasm of unspecified site of unspecified female breast: Secondary | ICD-10-CM | POA: Insufficient documentation

## 2012-10-19 MED ORDER — HEPARIN SOD (PORK) LOCK FLUSH 100 UNIT/ML IV SOLN
INTRAVENOUS | Status: AC
  Filled 2012-10-19: qty 5

## 2012-10-19 NOTE — Patient Instructions (Addendum)
[facility] Discharge Instructions  RECOMMENDATIONS MADE BY THE CONSULTANT AND ANY TEST RESULTS WILL BE SENT TO YOUR REFERRING PHYSICIAN.  EXAM FINDINGS BY THE PHYSICIAN TODAY AND SIGNS OR SYMPTOMS TO REPORT TO CLINIC OR PRIMARY PHYSICIAN: Exam and discussion by PA.  You can take your ativan along with your zofran.  MEDICATIONS PRESCRIBED:  none  INSTRUCTIONS GIVEN AND DISCUSSED: Report fevers, chills, uncontrolled nausea, vomiting, etc.  SPECIAL INSTRUCTIONS/FOLLOW-UP: As scheduled.  Thank you for choosing [facility] to provide your oncology and hematology care.  To afford each patient quality time with our providers, please arrive at least 15 minutes before your scheduled appointment time.  With your help, our goal is to use those 15 minutes to complete the necessary work-up to ensure our physicians have the information they need to help with your evaluation and healthcare recommendations.    Effective January 1st, 2014, we ask that you re-schedule your appointment with our physicians should you arrive 10 or more minutes late for your appointment.  We strive to give you quality time with our providers, and arriving late affects you and other patients whose appointments are after yours.    Again, thank you for choosing [facility].  Our hope is that these requests will decrease the amount of time that you wait before being seen by our physicians.       _____________________________________________________________  Should you have questions after your visit to [facility], please contact our office at [phone] between the hours of 8:30 a.m. and 5:00 p.m.  Voicemails left after 4:30 p.m. will not be returned until the following business day.  For prescription refill requests, have your pharmacy contact our office with your prescription refill request.

## 2012-10-24 ENCOUNTER — Other Ambulatory Visit (HOSPITAL_COMMUNITY): Payer: Self-pay | Admitting: Oncology

## 2012-10-24 ENCOUNTER — Encounter (HOSPITAL_BASED_OUTPATIENT_CLINIC_OR_DEPARTMENT_OTHER): Payer: Medicaid Other

## 2012-10-24 VITALS — BP 127/76 | HR 87 | Temp 98.2°F | Resp 16 | Wt 191.0 lb

## 2012-10-24 DIAGNOSIS — E876 Hypokalemia: Secondary | ICD-10-CM

## 2012-10-24 LAB — COMPREHENSIVE METABOLIC PANEL
ALT: 35 U/L (ref 0–35)
AST: 23 U/L (ref 0–37)
Alkaline Phosphatase: 121 U/L — ABNORMAL HIGH (ref 39–117)
CO2: 25 mEq/L (ref 19–32)
Calcium: 9.3 mg/dL (ref 8.4–10.5)
GFR calc Af Amer: >90 mL/min (ref >90)
GFR calc non Af Amer: >90 mL/min (ref >90)
Glucose, Bld: 140 mg/dL — ABNORMAL HIGH (ref 70–99)
Potassium: 3.2 mEq/L — ABNORMAL LOW (ref 3.5–5.1)
Sodium: 139 mEq/L (ref 135–145)

## 2012-10-24 LAB — CBC WITH DIFFERENTIAL/PLATELET
Eosinophils Absolute: 0 10*3/uL (ref 0.0–0.7)
Hemoglobin: 10.8 g/dL — ABNORMAL LOW (ref 12.0–15.0)
Lymphocytes Relative: 8 % — ABNORMAL LOW (ref 12–46)
Lymphs Abs: 0.5 10*3/uL — ABNORMAL LOW (ref 0.7–4.0)
Monocytes Relative: 10 % (ref 3–12)
Neutro Abs: 5.4 10*3/uL (ref 1.7–7.7)
Neutrophils Relative %: 81 % — ABNORMAL HIGH (ref 43–77)
Platelets: 166 10*3/uL (ref 150–400)
RBC: 4.1 MIL/uL (ref 3.87–5.11)
WBC: 6.7 10*3/uL (ref 4.0–10.5)

## 2012-10-24 LAB — CANCER ANTIGEN 27.29: CA 27.29: 325 U/mL — ABNORMAL HIGH (ref 0–39)

## 2012-10-24 MED ORDER — HEPARIN SOD (PORK) LOCK FLUSH 100 UNIT/ML IV SOLN
INTRAVENOUS | Status: AC
  Filled 2012-10-24: qty 5

## 2012-10-24 MED ORDER — DEXAMETHASONE SODIUM PHOSPHATE 4 MG/ML IJ SOLN: 12.0000 mg | Freq: Once | INTRAMUSCULAR | Status: DC

## 2012-10-24 MED ORDER — SODIUM CHLORIDE 0.9 % IJ SOLN
10.0000 mL | INTRAMUSCULAR | Status: DC | PRN
  Filled 2012-10-24: qty 10

## 2012-10-24 MED ORDER — CYCLOPHOSPHAMIDE CHEMO INJECTION 1 GM
600.0000 mg/m2 | Freq: Once | INTRAMUSCULAR | Status: AC
  Administered 2012-10-24: 1160 mg via INTRAVENOUS
  Filled 2012-10-24: qty 58

## 2012-10-24 MED ORDER — PALONOSETRON HCL INJECTION 0.25 MG/5ML
0.2500 mg | Freq: Once | INTRAVENOUS | Status: AC
  Administered 2012-10-24: 0.25 mg via INTRAVENOUS

## 2012-10-24 MED ORDER — SODIUM CHLORIDE 0.9 % IV SOLN
Freq: Once | INTRAVENOUS | Status: AC
  Administered 2012-10-24: 11:00:00 via INTRAVENOUS

## 2012-10-24 MED ORDER — HEPARIN SOD (PORK) LOCK FLUSH 100 UNIT/ML IV SOLN
500.0000 [IU] | Freq: Once | INTRAVENOUS | Status: AC | PRN
  Administered 2012-10-24: 500 [IU]
  Filled 2012-10-24: qty 5

## 2012-10-24 MED ORDER — FOSAPREPITANT DIMEGLUMINE INJECTION 150 MG
Freq: Once | INTRAVENOUS | Status: AC
  Administered 2012-10-24: 10:00:00 via INTRAVENOUS
  Filled 2012-10-24: qty 5

## 2012-10-24 MED ORDER — SODIUM CHLORIDE 0.9 % IV SOLN: 150.0000 mg | Freq: Once | INTRAVENOUS | Status: DC

## 2012-10-24 MED ORDER — POTASSIUM CHLORIDE CRYS ER 20 MEQ PO TBCR: 20.0000 meq | EXTENDED_RELEASE_TABLET | Freq: Every day | ORAL | Status: DC

## 2012-10-24 MED ORDER — DOXORUBICIN HCL CHEMO IV INJECTION 2 MG/ML
60.0000 mg/m2 | Freq: Once | INTRAVENOUS | Status: AC
  Administered 2012-10-24: 116 mg via INTRAVENOUS
  Filled 2012-10-24: qty 58

## 2012-10-24 MED ORDER — PALONOSETRON HCL INJECTION 0.25 MG/5ML
INTRAVENOUS | Status: AC
  Filled 2012-10-24: qty 5

## 2012-10-24 NOTE — Progress Notes (Signed)
Tolerated chemo well. Home accompanied by mother.

## 2012-10-25 ENCOUNTER — Telehealth (HOSPITAL_COMMUNITY): Payer: Self-pay

## 2012-10-25 ENCOUNTER — Encounter (HOSPITAL_BASED_OUTPATIENT_CLINIC_OR_DEPARTMENT_OTHER): Payer: Medicaid Other

## 2012-10-25 VITALS — BP 143/85 | HR 93 | Temp 98.0°F | Resp 18

## 2012-10-25 MED ORDER — PEGFILGRASTIM INJECTION 6 MG/0.6ML
SUBCUTANEOUS | Status: AC
  Filled 2012-10-25: qty 0.6

## 2012-10-25 MED ORDER — PEGFILGRASTIM INJECTION 6 MG/0.6ML
6.0000 mg | Freq: Once | SUBCUTANEOUS | Status: AC
  Administered 2012-10-25: 6 mg via SUBCUTANEOUS

## 2012-10-25 NOTE — Progress Notes (Signed)
Brenda Avery presents today for injection per MD orders. Neulasta 6mg administered SQ in right Abdomen. Administration without incident. Patient tolerated well.  

## 2012-11-01 ENCOUNTER — Telehealth: Payer: Self-pay | Admitting: Genetic Counselor

## 2012-11-01 NOTE — Telephone Encounter (Signed)
Left good news message about test results 

## 2012-11-02 ENCOUNTER — Ambulatory Visit (HOSPITAL_COMMUNITY)
Admission: RE | Admit: 2012-11-02 | Discharge: 2012-11-02 | Disposition: A | Discharge status: Home / Self Care | Payer: Medicaid Other | Source: Ambulatory Visit | Attending: Oncology | Admitting: Oncology

## 2012-11-02 ENCOUNTER — Encounter (HOSPITAL_BASED_OUTPATIENT_CLINIC_OR_DEPARTMENT_OTHER): Payer: Medicaid Other | Admitting: Oncology

## 2012-11-02 ENCOUNTER — Encounter (HOSPITAL_COMMUNITY): Payer: Self-pay | Admitting: Oncology

## 2012-11-02 VITALS — BP 135/85 | HR 98 | Temp 98.7°F | Resp 16 | Wt 191.4 lb

## 2012-11-02 DIAGNOSIS — C50919 Malignant neoplasm of unspecified site of unspecified female breast: Secondary | ICD-10-CM | POA: Insufficient documentation

## 2012-11-02 DIAGNOSIS — I517 Cardiomegaly: Secondary | ICD-10-CM

## 2012-11-02 DIAGNOSIS — Z9221 Personal history of antineoplastic chemotherapy: Secondary | ICD-10-CM | POA: Insufficient documentation

## 2012-11-02 MED ORDER — ONDANSETRON 8 MG PO TBDP: 8.0000 mg | ORAL_TABLET | Freq: Three times a day (TID) | ORAL | Status: DC | PRN

## 2012-11-02 NOTE — Progress Notes (Signed)
*  PRELIMINARY RESULTS* Echocardiogram 2D Echocardiogram has been performed.  Brenda Avery 11/02/2012, 2:28 PM

## 2012-11-02 NOTE — Patient Instructions (Addendum)
[facility] Discharge Instructions  RECOMMENDATIONS MADE BY THE CONSULTANT AND ANY TEST RESULTS WILL BE SENT TO YOUR REFERRING PHYSICIAN.  EXAM FINDINGS BY THE PHYSICIAN TODAY AND SIGNS OR SYMPTOMS TO REPORT TO CLINIC OR PRIMARY PHYSICIAN: Exam and discussion by MD.  The tumor is responding to the chemotherapy and you are doing well.  If your ECHO is ok we will proceed with the final cycle of this chemotherapy. Need to see how things are once the chemotherapy is completed to make a better decision regarding surgery.  MEDICATIONS PRESCRIBED:  Zofran 8 mg ODT use just like you did the other zofran but you place it on top of your tongue and it will dissolve.  INSTRUCTIONS GIVEN AND DISCUSSED: Report uncontrolled nausea, vomiting, or other problems.  SPECIAL INSTRUCTIONS/FOLLOW-UP: Chemotherapy as scheduled and to see PA in 3 - 4 weeks.  Thank you for choosing [facility] to provide your oncology and hematology care.  To afford each patient quality time with our providers, please arrive at least 15 minutes before your scheduled appointment time.  With your help, our goal is to use those 15 minutes to complete the necessary work-up to ensure our physicians have the information they need to help with your evaluation and healthcare recommendations.    Effective January 1st, 2014, we ask that you re-schedule your appointment with our physicians should you arrive 10 or more minutes late for your appointment.  We strive to give you quality time with our providers, and arriving late affects you and other patients whose appointments are after yours.    Again, thank you for choosing [facility].  Our hope is that these requests will decrease the amount of time that you wait before being seen by our physicians.       _____________________________________________________________  Should you have questions after your visit to [facility], please  contact our office at [phone] between the hours of 8:30 a.m. and 5:00 p.m.  Voicemails left after 4:30 p.m. will not be returned until the following business day.  For prescription refill requests, have your pharmacy contact our office with your prescription refill request.

## 2012-11-02 NOTE — Progress Notes (Signed)
Problem #1 stage III versus stage IV infiltrating ductal carcinoma the left breast ER +100%, PR 30%, HER-2/neu nonamplified, Ki-67 marker 34%, with a very elevated cancer marker , positive PET scan very suspicious for metastatic disease. Biopsy of the liver however revealed sarcoidosis only.   Problem #2 sarcoidosis  #3 excess weight #4 abnormal right breast by MRI #5 BRCA1 and BRCA2 negative  She is tolerating therapy well except for nausea which responds well to Zofran and lorazepam. She is not handle the oral Zofran well so we will switch her to ODT Zofran. The Ativan makes her very sleepy and she tries not to use that because of her small children. She heard the other day from the geneticist stating that she is BRCA1 or BRCA2 negative.  She stated today that she would rather keep her breasts if possible.  She is accompanied by her niece and her very good friend who is also one of her ministers.Lawanna Kobus thinks this mass is shrinking. Her bowels are working well. She occasionally coughs but is not short winded. Her 2-D echo from today is pending.  On exam her vital signs are stable. Lymph nodes are negative in the cervical, supraclavicular, infraclavicular, axillary, and inguinal areas. There is no arm or leg edema. Her lungs are clear. Heart shows a regular rhythm and rate without murmur rub or gallop. Port-A-Cath is intact but slightly sensitive to palpation over the scar. There is no obvious fluid. Right breast is negative for masses. In the left breast the left breast is definitely better and the mass is smaller. It measures approximately 8 x 6 cm. The breast is much softer as is the mass in my opinion. By the time we started therapy the mass may have been as large as 11-12 cm.  Her abdomen remains soft and nontender without hepatosplenomegaly. She has no leg edema. Bowel sounds are normal.  I think it is still unclear whether she has metastatic disease that even her MRI was worrisome  for bone involvement.  I think the best we can do is to finish her chemotherapy with the Adriamycin and Cytoxan, then move on to Taxol and really in that her at the conclusion of Taxol. We will repeat MRIs and possibly a PET scan and then discuss her with Dr. Derrell Lolling. She will see me or Jenita Seashore in 3-4 weeks.

## 2012-11-04 ENCOUNTER — Encounter: Payer: Self-pay | Admitting: Genetic Counselor

## 2012-11-07 ENCOUNTER — Encounter (HOSPITAL_BASED_OUTPATIENT_CLINIC_OR_DEPARTMENT_OTHER): Payer: Medicaid Other

## 2012-11-07 VITALS — BP 132/74 | HR 98 | Temp 98.2°F | Resp 16 | Wt 195.8 lb

## 2012-11-07 DIAGNOSIS — C50919 Malignant neoplasm of unspecified site of unspecified female breast: Secondary | ICD-10-CM

## 2012-11-07 DIAGNOSIS — Z5111 Encounter for antineoplastic chemotherapy: Secondary | ICD-10-CM

## 2012-11-07 DIAGNOSIS — C50419 Malignant neoplasm of upper-outer quadrant of unspecified female breast: Secondary | ICD-10-CM

## 2012-11-07 LAB — CBC WITH DIFFERENTIAL/PLATELET
Basophils Relative: 1 % (ref 0–1)
Eosinophils Absolute: 0 10*3/uL (ref 0.0–0.7)
MCH: 26.6 pg (ref 26.0–34.0)
MCHC: 33.9 g/dL (ref 30.0–36.0)
Monocytes Relative: 10 % (ref 3–12)
Neutrophils Relative %: 81 % — ABNORMAL HIGH (ref 43–77)
Platelets: 169 10*3/uL (ref 150–400)
RDW: 16.7 % — ABNORMAL HIGH (ref 11.5–15.5)

## 2012-11-07 LAB — COMPREHENSIVE METABOLIC PANEL
AST: 23 U/L (ref 0–37)
CO2: 25 mEq/L (ref 19–32)
Calcium: 9.2 mg/dL (ref 8.4–10.5)
Creatinine, Ser: 0.67 mg/dL (ref 0.50–1.10)
GFR calc non Af Amer: >90 mL/min (ref >90)

## 2012-11-07 MED ORDER — SODIUM CHLORIDE 0.9 % IV SOLN: 150.0000 mg | Freq: Once | INTRAVENOUS | Status: DC

## 2012-11-07 MED ORDER — PALONOSETRON HCL INJECTION 0.25 MG/5ML
INTRAVENOUS | Status: AC
  Filled 2012-11-07: qty 5

## 2012-11-07 MED ORDER — SODIUM CHLORIDE 0.9 % IV SOLN
600.0000 mg/m2 | Freq: Once | INTRAVENOUS | Status: AC
  Administered 2012-11-07: 1160 mg via INTRAVENOUS
  Filled 2012-11-07: qty 58

## 2012-11-07 MED ORDER — DEXAMETHASONE SODIUM PHOSPHATE 4 MG/ML IJ SOLN: 12.0000 mg | Freq: Once | INTRAMUSCULAR | Status: DC

## 2012-11-07 MED ORDER — DOXORUBICIN HCL CHEMO IV INJECTION 2 MG/ML
60.0000 mg/m2 | Freq: Once | INTRAVENOUS | Status: AC
  Administered 2012-11-07: 116 mg via INTRAVENOUS
  Filled 2012-11-07: qty 58

## 2012-11-07 MED ORDER — HEPARIN SOD (PORK) LOCK FLUSH 100 UNIT/ML IV SOLN
INTRAVENOUS | Status: AC
  Filled 2012-11-07: qty 5

## 2012-11-07 MED ORDER — SODIUM CHLORIDE 0.9 % IV SOLN
Freq: Once | INTRAVENOUS | Status: AC
  Administered 2012-11-07: 10:00:00 via INTRAVENOUS

## 2012-11-07 MED ORDER — HEPARIN SOD (PORK) LOCK FLUSH 100 UNIT/ML IV SOLN
500.0000 [IU] | Freq: Once | INTRAVENOUS | Status: AC | PRN
  Administered 2012-11-07: 500 [IU]
  Filled 2012-11-07: qty 5

## 2012-11-07 MED ORDER — SODIUM CHLORIDE 0.9 % IJ SOLN
10.0000 mL | INTRAMUSCULAR | Status: DC | PRN
  Administered 2012-11-07: 10 mL
  Filled 2012-11-07: qty 10

## 2012-11-07 MED ORDER — PALONOSETRON HCL INJECTION 0.25 MG/5ML
0.2500 mg | Freq: Once | INTRAVENOUS | Status: AC
  Administered 2012-11-07: 0.25 mg via INTRAVENOUS

## 2012-11-07 MED ORDER — SODIUM CHLORIDE 0.9 % IV SOLN
Freq: Once | INTRAVENOUS | Status: AC
  Administered 2012-11-07: 11:00:00 via INTRAVENOUS
  Filled 2012-11-07: qty 5

## 2012-11-07 NOTE — Progress Notes (Signed)
Originally in chemo instructions patient was going to take Dexamethasone the night before, morning of, and for 1 day after Taxol chemo. However, Dr. Mariel Sleet said recently that patient's receiving weekly Taxol did not have to take Dexamethasone as previously mentioned. Therefore, on patient's new chemo/med calendar as well as medication list dexamethasone is not listed and does not have to be taken. These instructions are being printed for patient as well as new chemo/med calendar. This will also be discussed with patient.

## 2012-11-08 ENCOUNTER — Encounter (HOSPITAL_BASED_OUTPATIENT_CLINIC_OR_DEPARTMENT_OTHER): Payer: Medicaid Other

## 2012-11-08 ENCOUNTER — Telehealth (HOSPITAL_COMMUNITY): Payer: Self-pay | Admitting: *Deleted

## 2012-11-08 VITALS — BP 128/75 | HR 117 | Temp 98.2°F | Resp 18

## 2012-11-08 DIAGNOSIS — C7951 Secondary malignant neoplasm of bone: Secondary | ICD-10-CM

## 2012-11-08 DIAGNOSIS — C50919 Malignant neoplasm of unspecified site of unspecified female breast: Secondary | ICD-10-CM

## 2012-11-08 DIAGNOSIS — C50419 Malignant neoplasm of upper-outer quadrant of unspecified female breast: Secondary | ICD-10-CM

## 2012-11-08 DIAGNOSIS — C7952 Secondary malignant neoplasm of bone marrow: Secondary | ICD-10-CM

## 2012-11-08 MED ORDER — PEGFILGRASTIM INJECTION 6 MG/0.6ML
SUBCUTANEOUS | Status: AC
  Filled 2012-11-08: qty 0.6

## 2012-11-08 MED ORDER — PEGFILGRASTIM INJECTION 6 MG/0.6ML
6.0000 mg | Freq: Once | SUBCUTANEOUS | Status: AC
  Administered 2012-11-08: 6 mg via SUBCUTANEOUS

## 2012-11-08 NOTE — Progress Notes (Signed)
Brenda Avery presents today for injection per MD orders. Neulasta 6mg  administered SQ in left Abdomen. Administration without incident. Patient tolerated well.

## 2012-11-08 NOTE — Telephone Encounter (Signed)
Brenda Avery starts taxol in 2 weeks

## 2012-11-15 NOTE — Telephone Encounter (Signed)
Did ok post chemo.

## 2012-11-21 ENCOUNTER — Encounter (HOSPITAL_COMMUNITY): Payer: Medicaid Other | Attending: Oncology

## 2012-11-21 VITALS — BP 128/81 | HR 89 | Temp 98.8°F | Resp 18 | Wt 196.0 lb

## 2012-11-21 DIAGNOSIS — E876 Hypokalemia: Secondary | ICD-10-CM | POA: Insufficient documentation

## 2012-11-21 DIAGNOSIS — C7951 Secondary malignant neoplasm of bone: Secondary | ICD-10-CM

## 2012-11-21 DIAGNOSIS — C800 Disseminated malignant neoplasm, unspecified: Secondary | ICD-10-CM | POA: Insufficient documentation

## 2012-11-21 DIAGNOSIS — Z5111 Encounter for antineoplastic chemotherapy: Secondary | ICD-10-CM

## 2012-11-21 DIAGNOSIS — C50912 Malignant neoplasm of unspecified site of left female breast: Secondary | ICD-10-CM

## 2012-11-21 DIAGNOSIS — C50419 Malignant neoplasm of upper-outer quadrant of unspecified female breast: Secondary | ICD-10-CM

## 2012-11-21 DIAGNOSIS — C50919 Malignant neoplasm of unspecified site of unspecified female breast: Secondary | ICD-10-CM | POA: Insufficient documentation

## 2012-11-21 LAB — COMPREHENSIVE METABOLIC PANEL
ALT: 30 U/L (ref 0–35)
BUN: 10 mg/dL (ref 6–23)
CO2: 25 mEq/L (ref 19–32)
Calcium: 9.3 mg/dL (ref 8.4–10.5)
Creatinine, Ser: 0.69 mg/dL (ref 0.50–1.10)
GFR calc Af Amer: >90 mL/min (ref >90)
GFR calc non Af Amer: >90 mL/min (ref >90)
Glucose, Bld: 145 mg/dL — ABNORMAL HIGH (ref 70–99)

## 2012-11-21 LAB — CBC WITH DIFFERENTIAL/PLATELET
Eosinophils Relative: 0 % (ref 0–5)
HCT: 29.7 % — ABNORMAL LOW (ref 36.0–46.0)
Lymphocytes Relative: 8 % — ABNORMAL LOW (ref 12–46)
Lymphs Abs: 0.3 10*3/uL — ABNORMAL LOW (ref 0.7–4.0)
MCH: 26.6 pg (ref 26.0–34.0)
MCV: 79.8 fL (ref 78.0–100.0)
Monocytes Absolute: 0.4 10*3/uL (ref 0.1–1.0)
Monocytes Relative: 10 % (ref 3–12)
RBC: 3.72 MIL/uL — ABNORMAL LOW (ref 3.87–5.11)
WBC: 3.8 10*3/uL — ABNORMAL LOW (ref 4.0–10.5)

## 2012-11-21 LAB — CANCER ANTIGEN 27.29: CA 27.29: 201 U/mL — ABNORMAL HIGH (ref 0–39)

## 2012-11-21 MED ORDER — HEPARIN SOD (PORK) LOCK FLUSH 100 UNIT/ML IV SOLN
INTRAVENOUS | Status: AC
  Filled 2012-11-21: qty 5

## 2012-11-21 MED ORDER — DIPHENHYDRAMINE HCL 50 MG/ML IJ SOLN
50.0000 mg | Freq: Once | INTRAMUSCULAR | Status: AC
  Administered 2012-11-21: 50 mg via INTRAVENOUS

## 2012-11-21 MED ORDER — HEPARIN SOD (PORK) LOCK FLUSH 100 UNIT/ML IV SOLN
500.0000 [IU] | Freq: Once | INTRAVENOUS | Status: AC | PRN
  Administered 2012-11-21: 500 [IU]
  Filled 2012-11-21: qty 5

## 2012-11-21 MED ORDER — DIPHENHYDRAMINE HCL 50 MG/ML IJ SOLN
INTRAMUSCULAR | Status: AC
  Filled 2012-11-21: qty 1

## 2012-11-21 MED ORDER — POTASSIUM CHLORIDE CRYS ER 20 MEQ PO TBCR: 20.0000 meq | EXTENDED_RELEASE_TABLET | Freq: Two times a day (BID) | ORAL | Status: DC

## 2012-11-21 MED ORDER — SODIUM CHLORIDE 0.9 % IJ SOLN
10.0000 mL | INTRAMUSCULAR | Status: DC | PRN
  Filled 2012-11-21: qty 10

## 2012-11-21 MED ORDER — DEXAMETHASONE SODIUM PHOSPHATE 10 MG/ML IJ SOLN: 20.0000 mg | Freq: Once | INTRAMUSCULAR | Status: DC

## 2012-11-21 MED ORDER — PACLITAXEL CHEMO INJECTION 300 MG/50ML
80.0000 mg/m2 | Freq: Once | INTRAVENOUS | Status: AC
  Administered 2012-11-21: 156 mg via INTRAVENOUS
  Filled 2012-11-21: qty 26

## 2012-11-21 MED ORDER — SODIUM CHLORIDE 0.9 % IV SOLN
Freq: Once | INTRAVENOUS | Status: AC
  Administered 2012-11-21: 8 mg via INTRAVENOUS
  Filled 2012-11-21: qty 4

## 2012-11-21 MED ORDER — FAMOTIDINE IN NACL 20-0.9 MG/50ML-% IV SOLN
INTRAVENOUS | Status: AC
  Filled 2012-11-21: qty 50

## 2012-11-21 MED ORDER — FAMOTIDINE IN NACL 20-0.9 MG/50ML-% IV SOLN
20.0000 mg | Freq: Once | INTRAVENOUS | Status: AC
  Administered 2012-11-21: 20 mg via INTRAVENOUS

## 2012-11-21 MED ORDER — SODIUM CHLORIDE 0.9 % IV SOLN: 8.0000 mg | Freq: Once | INTRAVENOUS | Status: DC

## 2012-11-21 MED ORDER — SODIUM CHLORIDE 0.9 % IV SOLN
Freq: Once | INTRAVENOUS | Status: AC
  Administered 2012-11-21: 10:00:00 via INTRAVENOUS

## 2012-11-21 NOTE — Progress Notes (Signed)
Tolerated well

## 2012-11-22 ENCOUNTER — Telehealth (HOSPITAL_COMMUNITY): Payer: Self-pay

## 2012-11-22 NOTE — Telephone Encounter (Signed)
Stated had no problem with chemo yesterday.  Will call clinic with any problems, questions, or concerns.

## 2012-11-23 ENCOUNTER — Encounter: Payer: Self-pay | Admitting: Oncology

## 2012-11-24 ENCOUNTER — Encounter (INDEPENDENT_AMBULATORY_CARE_PROVIDER_SITE_OTHER): Payer: Self-pay | Admitting: General Surgery

## 2012-11-24 ENCOUNTER — Ambulatory Visit (INDEPENDENT_AMBULATORY_CARE_PROVIDER_SITE_OTHER): Payer: Medicaid Other | Admitting: General Surgery

## 2012-11-24 VITALS — BP 120/82 | HR 120 | Temp 98.5°F | Resp 18 | Ht 64.0 in | Wt 193.4 lb

## 2012-11-24 DIAGNOSIS — C50919 Malignant neoplasm of unspecified site of unspecified female breast: Secondary | ICD-10-CM

## 2012-11-24 DIAGNOSIS — C50912 Malignant neoplasm of unspecified site of left female breast: Secondary | ICD-10-CM

## 2012-11-24 DIAGNOSIS — C800 Disseminated malignant neoplasm, unspecified: Secondary | ICD-10-CM

## 2012-11-24 NOTE — Patient Instructions (Signed)
Examination of your left breast today reveals the tumor is  smaller. Perhaps 6 cm or so. I still feel some enlarged lymph nodes in the left axilla, but they are not that large and they move around quite well. In the right breast there may be a small area of thickening laterally but I am not sure. The Port-A-Cath looks fine.  Dr. Mariel Sleet Will schedule you for an end of treatment MRI after he finishes all the Taxol chemotherapy. We are estimating the that will be July 1 or so..  You will be given an appointment to see me in July after the MRI is done.  Our tentative plan for surgery is bilateral mastectomies, left axillary lymph node dissection, and right axillary sentinel node biopsy. We can remove the Port-A-Cath at the same time if Dr. Mariel Sleet approves of that.  I think that your breast reconstruction will have to be delayed because of the radiation therapy to the left breast. Please let me know when you are ready to be referred to a plastic surgeon for consultation. That can be done at any time.

## 2012-11-24 NOTE — Progress Notes (Signed)
Patient ID: Brenda Avery, female   DOB: 03-Aug-1971, 42 y.o.   MRN: [id number] History: This patient returns for further discussion about her breast cancer. Her initial  MRI  suggests more extensive disease in the left and 2 masses in the right breast, which were not suspected initially.  She has completed her initial round of chemotherapy of chemotherapy and that has gone well. She has just started a 12 week course of Taxol. This should in July 1 or so.  Once the liver biopsy showed sarcoidosis, it became apparent that it is possible she does not have stage IV disease. We went ahead with bilateral breast MRI at that time, knowing that this was not baseline since she had received her first cycle of chemo. This showed marked bilateral parenchymal enhancement. 2 biopsy marker clips were seen on the left but there was extensive breast cancer involving multiple quadrants on the left, left upper outer quadrant and left lower outer quadrant and also suspicious for chest wall involvement, possibly or possible axillary node involvement and sternal metastasis. Hopefully this is sarcoid. The right breast showed 2 small masses, 18 mm and 9 mm at the 10:00 position. To biopsy the right side an ultrasound would need to be performed, and possibly MRI biopsy would need to be performed. She declined to have any further biopsies on the right, stating that she would like to go ahead with bilateral mastectomies when it is time to do her definitive surgery. She has had genetic testing and her BRCA1 and BRCA2 testing were negative. She knows that she will probably get radiation therapy on the left, and that reconstructive surgery would need to be delayed until the radiation therapy was completed and she had recovered. She is comfortable with that.  ROS: 10 system review of systems is negative except as described above  Exam: Patient looks well. No distress. Reasonably good spirits. Husband is with her. Neck: No adenopathy or mass.  Port-A-Cath right internal jugular vein well healed. Lungs: Clear auscultation bilaterally. Port right infraclavicular area looks good. Heart: Regular rate and rhythm. No murmur. No ectopy. Good pulses Breasts: Very large and symmetrical. Palpable mass left upper outer quadrant does seem smaller, perhaps about 5-6 cm. There are some somewhat enlarged but mobile left axillary lymph nodes. No mass lower outer quadrant of the breast. Right breast is large. There may be a small area of thickening at the 9:00 position. No axillary adenopathy on the right.  Assessment: Advanced left-sided breast cancer, infiltrating ductal type, U. Stage III versus stage IV, receptor positive, HER-2-negative, initial clinical stage T3, N1. Abnormal MRI right breast, suspect right breast cancer. Patient declined further biopsy of the right. BRCA1 and BRCA2 negative. Sarcoidosis of liver.  Plan: Complete 12 week cycle of Taxol chemotherapy. End of treatment MRI in July See me in July and for planning of definitive breast surgery. I told her we could remove the Port-A-Cath it Dr. Mariel Sleet felt that was appropriate. Definitive surgical plan is bilateral total mastectomies, left axillary lymph node dissection, right axilla sentinel node biopsy and possible removal of Port-A-Cath. She is interested in reconstruction. She is not interested intalking to a Engineer, petroleum yet, she will call me when she feels ready. That can be done either before or after her definitive breast surgery.   Angelia Mould. Derrell Lolling, M.D., [facility] General and Minimally invasive Surgery Breast and Colorectal Surgery Office:   [phone] Pager:   [phone]

## 2012-11-28 ENCOUNTER — Encounter (HOSPITAL_BASED_OUTPATIENT_CLINIC_OR_DEPARTMENT_OTHER): Payer: Medicaid Other

## 2012-11-28 VITALS — BP 139/88 | HR 107 | Temp 98.1°F | Resp 16 | Wt 197.2 lb

## 2012-11-28 DIAGNOSIS — C50912 Malignant neoplasm of unspecified site of left female breast: Secondary | ICD-10-CM

## 2012-11-28 DIAGNOSIS — Z170 Estrogen receptor positive status [ER+]: Secondary | ICD-10-CM

## 2012-11-28 DIAGNOSIS — Z5111 Encounter for antineoplastic chemotherapy: Secondary | ICD-10-CM

## 2012-11-28 DIAGNOSIS — C50919 Malignant neoplasm of unspecified site of unspecified female breast: Secondary | ICD-10-CM

## 2012-11-28 DIAGNOSIS — C50419 Malignant neoplasm of upper-outer quadrant of unspecified female breast: Secondary | ICD-10-CM

## 2012-11-28 LAB — COMPREHENSIVE METABOLIC PANEL
AST: 26 U/L (ref 0–37)
Albumin: 3.5 g/dL (ref 3.5–5.2)
BUN: 12 mg/dL (ref 6–23)
CO2: 24 mEq/L (ref 19–32)
Calcium: 9.1 mg/dL (ref 8.4–10.5)
Chloride: 104 mEq/L (ref 96–112)
Creatinine, Ser: 0.7 mg/dL (ref 0.50–1.10)
GFR calc non Af Amer: >90 mL/min (ref >90)
Total Bilirubin: 0.3 mg/dL (ref 0.3–1.2)

## 2012-11-28 LAB — CBC WITH DIFFERENTIAL/PLATELET
Basophils Absolute: 0 10*3/uL (ref 0.0–0.1)
Basophils Relative: 1 % (ref 0–1)
Eosinophils Relative: 2 % (ref 0–5)
HCT: 28 % — ABNORMAL LOW (ref 36.0–46.0)
Hemoglobin: 9.8 g/dL — ABNORMAL LOW (ref 12.0–15.0)
MCHC: 35 g/dL (ref 30.0–36.0)
MCV: 78.9 fL (ref 78.0–100.0)
Monocytes Absolute: 0.5 10*3/uL (ref 0.1–1.0)
Monocytes Relative: 17 % — ABNORMAL HIGH (ref 3–12)
Neutro Abs: 2 10*3/uL (ref 1.7–7.7)
RDW: 18.1 % — ABNORMAL HIGH (ref 11.5–15.5)

## 2012-11-28 MED ORDER — DIPHENHYDRAMINE HCL 50 MG/ML IJ SOLN
INTRAMUSCULAR | Status: AC
  Filled 2012-11-28: qty 1

## 2012-11-28 MED ORDER — PACLITAXEL CHEMO INJECTION 300 MG/50ML
80.0000 mg/m2 | Freq: Once | INTRAVENOUS | Status: AC
  Administered 2012-11-28: 156 mg via INTRAVENOUS
  Filled 2012-11-28: qty 26

## 2012-11-28 MED ORDER — HEPARIN SOD (PORK) LOCK FLUSH 100 UNIT/ML IV SOLN
500.0000 [IU] | Freq: Once | INTRAVENOUS | Status: AC | PRN
  Administered 2012-11-28: 500 [IU]
  Filled 2012-11-28: qty 5

## 2012-11-28 MED ORDER — SODIUM CHLORIDE 0.9 % IJ SOLN
10.0000 mL | INTRAMUSCULAR | Status: DC | PRN
  Administered 2012-11-28: 10 mL
  Filled 2012-11-28: qty 10

## 2012-11-28 MED ORDER — HEPARIN SOD (PORK) LOCK FLUSH 100 UNIT/ML IV SOLN
INTRAVENOUS | Status: AC
  Filled 2012-11-28: qty 5

## 2012-11-28 MED ORDER — DIPHENHYDRAMINE HCL 50 MG/ML IJ SOLN
50.0000 mg | Freq: Once | INTRAMUSCULAR | Status: AC
  Administered 2012-11-28: 50 mg via INTRAVENOUS

## 2012-11-28 MED ORDER — FAMOTIDINE IN NACL 20-0.9 MG/50ML-% IV SOLN
20.0000 mg | Freq: Once | INTRAVENOUS | Status: AC
  Administered 2012-11-28: 20 mg via INTRAVENOUS

## 2012-11-28 MED ORDER — SODIUM CHLORIDE 0.9 % IV SOLN
Freq: Once | INTRAVENOUS | Status: AC
  Administered 2012-11-28: 8 mg via INTRAVENOUS
  Filled 2012-11-28: qty 4

## 2012-11-28 MED ORDER — FAMOTIDINE IN NACL 20-0.9 MG/50ML-% IV SOLN
INTRAVENOUS | Status: AC
  Filled 2012-11-28: qty 50

## 2012-11-28 NOTE — Progress Notes (Signed)
Tolerated chemotherapy well today.

## 2012-11-29 MED ORDER — CYANOCOBALAMIN 1000 MCG/ML IJ SOLN
INTRAMUSCULAR | Status: AC
  Filled 2012-11-29: qty 1

## 2012-11-30 ENCOUNTER — Encounter (HOSPITAL_BASED_OUTPATIENT_CLINIC_OR_DEPARTMENT_OTHER): Payer: Medicaid Other | Admitting: Oncology

## 2012-11-30 VITALS — BP 131/87 | HR 116 | Temp 98.3°F | Resp 18 | Wt 197.1 lb

## 2012-11-30 DIAGNOSIS — D869 Sarcoidosis, unspecified: Secondary | ICD-10-CM

## 2012-11-30 DIAGNOSIS — Z170 Estrogen receptor positive status [ER+]: Secondary | ICD-10-CM

## 2012-11-30 DIAGNOSIS — C50912 Malignant neoplasm of unspecified site of left female breast: Secondary | ICD-10-CM

## 2012-11-30 DIAGNOSIS — C50419 Malignant neoplasm of upper-outer quadrant of unspecified female breast: Secondary | ICD-10-CM

## 2012-11-30 NOTE — Addendum Note (Signed)
Addended by: Ellouise Newer on: 11/30/2012 12:46 PM   Modules accepted: Orders

## 2012-11-30 NOTE — Patient Instructions (Addendum)
.  [facility] Discharge Instructions  RECOMMENDATIONS MADE BY THE CONSULTANT AND ANY TEST RESULTS WILL BE SENT TO YOUR REFERRING PHYSICIAN.  EXAM FINDINGS BY THE PHYSICIAN TODAY AND SIGNS OR SYMPTOMS TO REPORT TO CLINIC OR PRIMARY PHYSICIAN:  Exam good   INSTRUCTIONS GIVEN AND DISCUSSED: Delsym is a great over the counter cough syrup You can do benadryl at night and claritin during the day  SPECIAL INSTRUCTIONS/FOLLOW-UP: Pet scan after Taxol completed  Thank you for choosing [facility] to provide your oncology and hematology care.  To afford each patient quality time with our providers, please arrive at least 15 minutes before your scheduled appointment time.  With your help, our goal is to use those 15 minutes to complete the necessary work-up to ensure our physicians have the information they need to help with your evaluation and healthcare recommendations.    Effective January 1st, 2014, we ask that you re-schedule your appointment with our physicians should you arrive 10 or more minutes late for your appointment.  We strive to give you quality time with our providers, and arriving late affects you and other patients whose appointments are after yours.    Again, thank you for choosing [facility].  Our hope is that these requests will decrease the amount of time that you wait before being seen by our physicians.       _____________________________________________________________  Should you have questions after your visit to [facility], please contact our office at [phone] between the hours of 8:30 a.m. and 5:00 p.m.  Voicemails left after 4:30 p.m. will not be returned until the following business day.  For prescription refill requests, have your pharmacy contact our office with your prescription refill request.

## 2012-11-30 NOTE — Progress Notes (Addendum)
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  S/P 4 cycles of AC from 09/26/2012- 11/07/2012.  INTERVAL HISTORY: Brenda Avery 42 y.o. female returns for  regular  visit for followup of Stage III versus stage IV infiltrating ductal carcinoma the left breast ER +100%, PR 30%, HER-2/neu nonamplified, Ki-67 marker 34%, with a very elevated cancer marker , positive PET scan very suspicious for metastatic disease. Biopsy of the liver however revealed sarcoidosis only.   The patient is doing very well. She is tolerating her first 2 cycles of Taxol without any difficulties. She does admit to intermittent nausea which is well-controlled with Zofran ODT. She tries to avoid the lorazepam secondary to a causing drowsiness.  The patient denies any complaints. I spent some time discussing her future therapy plan which will consist completing of 12 cycles of weekly Taxol. We'll then pursue a PET scan approximately one week after completion of Taxol.  We'll continue prechemotherapy lab work including a CA 27. 29 (which will be done every 4 weeks).  She saw her surgeon, Dr. Derrell Lolling on 11/24/2012. She is slightly discouraged that she will likely require a mastectomy. She asked for education regarding a mastectomy versus lumpectomy. So we spent some time discussing this. She will likely benefit from mastectomy versus lumpectomy this is a Dr. Derrell Lolling has informed the patient.    Oncologically, the patient denies any complaints and ROS questioning is negative.  Past Medical History  Diagnosis Date  . Hyperlipidemia     diet therapy per patient request  . 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 Hematuria 2000/2001    evaluted in [place] with negative results  . Breast CA     left  . Diabetes     diet therapy     has DIABETES MELLITUS, TYPE II;  HYPERLIPIDEMIA; OBESITY; HEMORRHOIDS, EXTERNAL; TONSILLITIS, ACUTE; RHINITIS; ARM PAIN, LEFT; PALPITATIONS; PELVIC PAIN, ACUTE; ELECTROCARDIOGRAM, ABNORMAL; and Breast carcinoma metastatic to multiple sites on her problem list.     is allergic to metronidazole and sulfonamide derivatives.  Brenda Avery does not currently have medications on file.  Past Surgical History  Procedure Laterality Date  . Tubal ligation  2004  . Rectocele repair  2004  . Cystoscopy  2001  . Excision of facial nevi  2004  . Breast biopsy  2014  . Liver biopsy    . Portacath placement  09/20/2012    Procedure: INSERTION PORT-A-CATH;  Surgeon: Ernestene Mention, MD;  Location: [facility] OR;  Service: General;  Laterality: N/A;  port a cath insertion with flouro and ultrasound     Denies any headaches, dizziness, double vision, fevers, chills, night sweats, nausea, vomiting, diarrhea, constipation, chest pain, heart palpitations, shortness of breath, blood in stool, black tarry stool, urinary pain, urinary burning, urinary frequency, hematuria.   PHYSICAL EXAMINATION  ECOG PERFORMANCE STATUS: 0 - Asymptomatic  Filed Vitals:   11/30/12 1206  BP: 131/87  Pulse: 116  Temp: 98.3 F (36.8 C)  Resp: 18    GENERAL:alert, no distress, well nourished, well developed, comfortable, cooperative, obese and smiling SKIN: skin color, texture, turgor are normal, no rashes or significant lesions HEAD: Normocephalic, No masses, lesions, tenderness or abnormalities EYES: normal, Conjunctiva are pink and non-injected EARS: External ears normal OROPHARYNX:mucous membranes are moist  NECK: supple, no adenopathy, thyroid normal size,  non-tender, without nodularity, no stridor, non-tender, trachea midline LYMPH:  Mobile left axillary lymph nodes BREAST:abnormal mass palpable in left breast measuring 6 x 4 cm in size deep to the areola with a small area of thickening in the 9 o'clock position. LUNGS: clear to auscultation and  percussion HEART: regular rate & rhythm, no murmurs, no gallops, S1 normal and S2 normal ABDOMEN:abdomen soft, non-tender, obese and normal bowel sounds BACK: Back symmetric, no curvature., No CVA tenderness EXTREMITIES:less then 2 second capillary refill, no joint deformities, effusion, or inflammation, no edema, no skin discoloration, no clubbing, no cyanosis  NEURO: alert & oriented x 3 with fluent speech, no focal motor/sensory deficits, gait normal   LABORATORY DATA: CBC    Component Value Date/Time   WBC 2.9* 11/28/2012 1011   RBC 3.55* 11/28/2012 1011   HGB 9.8* 11/28/2012 1011   HCT 28.0* 11/28/2012 1011   PLT 244 11/28/2012 1011   MCV 78.9 11/28/2012 1011   MCH 27.6 11/28/2012 1011   MCHC 35.0 11/28/2012 1011   RDW 18.1* 11/28/2012 1011   LYMPHSABS 0.3* 11/28/2012 1011   MONOABS 0.5 11/28/2012 1011   EOSABS 0.1 11/28/2012 1011   BASOSABS 0.0 11/28/2012 1011      Chemistry      Component Value Date/Time   NA 138 11/28/2012 1011   K 3.5 11/28/2012 1011   CL 104 11/28/2012 1011   CO2 24 11/28/2012 1011   BUN 12 11/28/2012 1011   CREATININE 0.70 11/28/2012 1011      Component Value Date/Time   CALCIUM 9.1 11/28/2012 1011   ALKPHOS 111 11/28/2012 1011   AST 26 11/28/2012 1011   ALT 36* 11/28/2012 1011   BILITOT 0.3 11/28/2012 1011     Lab Results  Component Value Date   LABCA2 201* 11/21/2012       ASSESSMENT: 1. Stage III versus stage IV infiltrating ductal carcinoma the left breast ER +100%, PR 30%, HER-2/neu nonamplified, Ki-67 marker 34%, with a very elevated cancer marker , positive PET scan very suspicious for metastatic disease. Biopsy of the liver however revealed sarcoidosis only. S/P cycle 2 of Paclitaxel starting on 4/7/214.  S/P 4 cycles of AC from 09/26/2012- 11/07/2012. 2. Sarcoidosis  3. Excess weight  4. Abnormal right breast by MRI  5. BRCA1 and BRCA2 negative  Patient Active Problem List  Diagnosis  . DIABETES MELLITUS, TYPE II  . HYPERLIPIDEMIA  . OBESITY  .  HEMORRHOIDS, EXTERNAL  . TONSILLITIS, ACUTE  . RHINITIS  . ARM PAIN, LEFT  . PALPITATIONS  . PELVIC PAIN, ACUTE  . ELECTROCARDIOGRAM, ABNORMAL  . Breast carcinoma metastatic to multiple sites      PLAN:  1. I personally reviewed and went over laboratory results with the patient. 2. PET scan 1 week following completion of Taxol chemotherapy.  Order is placed and sent to scheduler for scheduling.   3. Pre-chemo labs: CBC diff, CMET 4. CA 27.29 every 4 weeks.  5. Recommend OTC allergy medication 6. Return as scheduled in 3 weeks.     All questions were answered. The patient knows to call the clinic with any problems, questions or concerns. We can certainly see the patient much sooner if necessary.  The patient and plan discussed with Glenford Peers, MD and he is in agreement with the aforementioned.  KEFALAS,THOMAS

## 2012-12-05 ENCOUNTER — Encounter (HOSPITAL_BASED_OUTPATIENT_CLINIC_OR_DEPARTMENT_OTHER): Payer: Medicaid Other

## 2012-12-05 VITALS — BP 139/92 | HR 99 | Temp 98.9°F | Resp 18 | Wt 197.0 lb

## 2012-12-05 DIAGNOSIS — C50919 Malignant neoplasm of unspecified site of unspecified female breast: Secondary | ICD-10-CM

## 2012-12-05 DIAGNOSIS — Z5111 Encounter for antineoplastic chemotherapy: Secondary | ICD-10-CM

## 2012-12-05 DIAGNOSIS — C7952 Secondary malignant neoplasm of bone marrow: Secondary | ICD-10-CM

## 2012-12-05 DIAGNOSIS — C50419 Malignant neoplasm of upper-outer quadrant of unspecified female breast: Secondary | ICD-10-CM

## 2012-12-05 DIAGNOSIS — C50912 Malignant neoplasm of unspecified site of left female breast: Secondary | ICD-10-CM

## 2012-12-05 LAB — CBC WITH DIFFERENTIAL/PLATELET
Hemoglobin: 10 g/dL — ABNORMAL LOW (ref 12.0–15.0)
Lymphocytes Relative: 8 % — ABNORMAL LOW (ref 12–46)
Lymphs Abs: 0.3 10*3/uL — ABNORMAL LOW (ref 0.7–4.0)
Monocytes Relative: 16 % — ABNORMAL HIGH (ref 3–12)
Neutro Abs: 2.8 10*3/uL (ref 1.7–7.7)
Neutrophils Relative %: 73 % (ref 43–77)
RBC: 3.71 MIL/uL — ABNORMAL LOW (ref 3.87–5.11)

## 2012-12-05 LAB — COMPREHENSIVE METABOLIC PANEL
Alkaline Phosphatase: 116 U/L (ref 39–117)
BUN: 9 mg/dL (ref 6–23)
Chloride: 103 mEq/L (ref 96–112)
GFR calc Af Amer: >90 mL/min (ref >90)
Glucose, Bld: 130 mg/dL — ABNORMAL HIGH (ref 70–99)
Potassium: 3.1 mEq/L — ABNORMAL LOW (ref 3.5–5.1)
Total Bilirubin: 0.4 mg/dL (ref 0.3–1.2)

## 2012-12-05 LAB — CANCER ANTIGEN 27.29: CA 27.29: 159 U/mL — ABNORMAL HIGH (ref 0–39)

## 2012-12-05 MED ORDER — DIPHENHYDRAMINE HCL 50 MG/ML IJ SOLN
INTRAMUSCULAR | Status: AC
  Filled 2012-12-05: qty 1

## 2012-12-05 MED ORDER — SODIUM CHLORIDE 0.9 % IV SOLN
Freq: Once | INTRAVENOUS | Status: AC
  Administered 2012-12-05: 11:00:00 via INTRAVENOUS

## 2012-12-05 MED ORDER — FAMOTIDINE IN NACL 20-0.9 MG/50ML-% IV SOLN
20.0000 mg | Freq: Once | INTRAVENOUS | Status: AC
  Administered 2012-12-05: 20 mg via INTRAVENOUS

## 2012-12-05 MED ORDER — SODIUM CHLORIDE 0.9 % IV SOLN
Freq: Once | INTRAVENOUS | Status: AC
  Administered 2012-12-05: 8 mg via INTRAVENOUS
  Filled 2012-12-05: qty 4

## 2012-12-05 MED ORDER — HEPARIN SOD (PORK) LOCK FLUSH 100 UNIT/ML IV SOLN
INTRAVENOUS | Status: AC
  Filled 2012-12-05: qty 5

## 2012-12-05 MED ORDER — HEPARIN SOD (PORK) LOCK FLUSH 100 UNIT/ML IV SOLN
500.0000 [IU] | Freq: Once | INTRAVENOUS | Status: AC | PRN
  Administered 2012-12-05: 500 [IU]
  Filled 2012-12-05: qty 5

## 2012-12-05 MED ORDER — PACLITAXEL CHEMO INJECTION 300 MG/50ML
80.0000 mg/m2 | Freq: Once | INTRAVENOUS | Status: AC
  Administered 2012-12-05: 156 mg via INTRAVENOUS
  Filled 2012-12-05: qty 26

## 2012-12-05 MED ORDER — DIPHENHYDRAMINE HCL 50 MG/ML IJ SOLN
50.0000 mg | Freq: Once | INTRAMUSCULAR | Status: AC
  Administered 2012-12-05: 50 mg via INTRAVENOUS

## 2012-12-05 NOTE — Progress Notes (Signed)
Tolerated well

## 2012-12-12 ENCOUNTER — Encounter (HOSPITAL_BASED_OUTPATIENT_CLINIC_OR_DEPARTMENT_OTHER): Payer: Medicaid Other

## 2012-12-12 VITALS — BP 144/81 | HR 98 | Temp 98.2°F | Resp 18 | Wt 199.4 lb

## 2012-12-12 DIAGNOSIS — C50912 Malignant neoplasm of unspecified site of left female breast: Secondary | ICD-10-CM

## 2012-12-12 DIAGNOSIS — C7951 Secondary malignant neoplasm of bone: Secondary | ICD-10-CM

## 2012-12-12 DIAGNOSIS — C50919 Malignant neoplasm of unspecified site of unspecified female breast: Secondary | ICD-10-CM

## 2012-12-12 DIAGNOSIS — C50419 Malignant neoplasm of upper-outer quadrant of unspecified female breast: Secondary | ICD-10-CM

## 2012-12-12 DIAGNOSIS — Z5111 Encounter for antineoplastic chemotherapy: Secondary | ICD-10-CM

## 2012-12-12 LAB — CBC WITH DIFFERENTIAL/PLATELET
Hemoglobin: 9.7 g/dL — ABNORMAL LOW (ref 12.0–15.0)
Lymphocytes Relative: 8 % — ABNORMAL LOW (ref 12–46)
Lymphs Abs: 0.3 10*3/uL — ABNORMAL LOW (ref 0.7–4.0)
Neutro Abs: 3.2 10*3/uL (ref 1.7–7.7)
Neutrophils Relative %: 76 % (ref 43–77)
Platelets: 252 10*3/uL (ref 150–400)
RBC: 3.59 MIL/uL — ABNORMAL LOW (ref 3.87–5.11)
WBC: 4.2 10*3/uL (ref 4.0–10.5)

## 2012-12-12 LAB — COMPREHENSIVE METABOLIC PANEL
ALT: 35 U/L (ref 0–35)
Alkaline Phosphatase: 107 U/L (ref 39–117)
CO2: 26 mEq/L (ref 19–32)
Chloride: 101 mEq/L (ref 96–112)
GFR calc Af Amer: >90 mL/min (ref >90)
GFR calc non Af Amer: >90 mL/min (ref >90)
Glucose, Bld: 131 mg/dL — ABNORMAL HIGH (ref 70–99)
Potassium: 3.5 mEq/L (ref 3.5–5.1)
Sodium: 138 mEq/L (ref 135–145)
Total Bilirubin: 0.4 mg/dL (ref 0.3–1.2)
Total Protein: 7.1 g/dL (ref 6.0–8.3)

## 2012-12-12 MED ORDER — SODIUM CHLORIDE 0.9 % IV SOLN
Freq: Once | INTRAVENOUS | Status: AC
  Administered 2012-12-12: 12:00:00 via INTRAVENOUS

## 2012-12-12 MED ORDER — DIPHENHYDRAMINE HCL 50 MG/ML IJ SOLN
INTRAMUSCULAR | Status: AC
  Filled 2012-12-12: qty 1

## 2012-12-12 MED ORDER — HEPARIN SOD (PORK) LOCK FLUSH 100 UNIT/ML IV SOLN
INTRAVENOUS | Status: AC
  Filled 2012-12-12: qty 5

## 2012-12-12 MED ORDER — FAMOTIDINE IN NACL 20-0.9 MG/50ML-% IV SOLN
20.0000 mg | Freq: Once | INTRAVENOUS | Status: AC
  Administered 2012-12-12: 20 mg via INTRAVENOUS

## 2012-12-12 MED ORDER — DIPHENHYDRAMINE HCL 50 MG/ML IJ SOLN
50.0000 mg | Freq: Once | INTRAMUSCULAR | Status: AC
  Administered 2012-12-12: 50 mg via INTRAVENOUS

## 2012-12-12 MED ORDER — HEPARIN SOD (PORK) LOCK FLUSH 100 UNIT/ML IV SOLN
500.0000 [IU] | Freq: Once | INTRAVENOUS | Status: AC | PRN
  Administered 2012-12-12: 500 [IU]
  Filled 2012-12-12: qty 5

## 2012-12-12 MED ORDER — SODIUM CHLORIDE 0.9 % IV SOLN
Freq: Once | INTRAVENOUS | Status: AC
  Administered 2012-12-12: 8 mg via INTRAVENOUS
  Filled 2012-12-12: qty 4

## 2012-12-12 MED ORDER — FAMOTIDINE IN NACL 20-0.9 MG/50ML-% IV SOLN
INTRAVENOUS | Status: AC
  Filled 2012-12-12: qty 50

## 2012-12-12 MED ORDER — PACLITAXEL CHEMO INJECTION 300 MG/50ML
80.0000 mg/m2 | Freq: Once | INTRAVENOUS | Status: AC
  Administered 2012-12-12: 156 mg via INTRAVENOUS
  Filled 2012-12-12: qty 26

## 2012-12-14 MED ORDER — LIDOCAINE HCL (PF) 2 % IJ SOLN
INTRAMUSCULAR | Status: AC
  Filled 2012-12-14: qty 10

## 2012-12-19 ENCOUNTER — Encounter (HOSPITAL_COMMUNITY): Payer: Medicaid Other | Attending: Oncology

## 2012-12-19 ENCOUNTER — Encounter (HOSPITAL_BASED_OUTPATIENT_CLINIC_OR_DEPARTMENT_OTHER): Payer: Medicaid Other | Admitting: Oncology

## 2012-12-19 VITALS — BP 127/78 | HR 106 | Temp 98.9°F | Resp 16 | Wt 196.0 lb

## 2012-12-19 DIAGNOSIS — D869 Sarcoidosis, unspecified: Secondary | ICD-10-CM

## 2012-12-19 DIAGNOSIS — C50912 Malignant neoplasm of unspecified site of left female breast: Secondary | ICD-10-CM

## 2012-12-19 DIAGNOSIS — C50419 Malignant neoplasm of upper-outer quadrant of unspecified female breast: Secondary | ICD-10-CM

## 2012-12-19 DIAGNOSIS — C50919 Malignant neoplasm of unspecified site of unspecified female breast: Secondary | ICD-10-CM | POA: Insufficient documentation

## 2012-12-19 DIAGNOSIS — Z5111 Encounter for antineoplastic chemotherapy: Secondary | ICD-10-CM

## 2012-12-19 DIAGNOSIS — Z170 Estrogen receptor positive status [ER+]: Secondary | ICD-10-CM

## 2012-12-19 DIAGNOSIS — E669 Obesity, unspecified: Secondary | ICD-10-CM

## 2012-12-19 DIAGNOSIS — C800 Disseminated malignant neoplasm, unspecified: Secondary | ICD-10-CM | POA: Insufficient documentation

## 2012-12-19 LAB — COMPREHENSIVE METABOLIC PANEL
AST: 25 U/L (ref 0–37)
Alkaline Phosphatase: 113 U/L (ref 39–117)
BUN: 11 mg/dL (ref 6–23)
CO2: 25 mEq/L (ref 19–32)
Chloride: 102 mEq/L (ref 96–112)
Creatinine, Ser: 0.71 mg/dL (ref 0.50–1.10)
GFR calc non Af Amer: >90 mL/min (ref >90)
Potassium: 3.4 mEq/L — ABNORMAL LOW (ref 3.5–5.1)
Total Bilirubin: 0.4 mg/dL (ref 0.3–1.2)

## 2012-12-19 LAB — CBC WITH DIFFERENTIAL/PLATELET
Basophils Absolute: 0 10*3/uL (ref 0.0–0.1)
HCT: 29.8 % — ABNORMAL LOW (ref 36.0–46.0)
Hemoglobin: 10.2 g/dL — ABNORMAL LOW (ref 12.0–15.0)
Lymphocytes Relative: 12 % (ref 12–46)
Monocytes Absolute: 0.2 10*3/uL (ref 0.1–1.0)
Monocytes Relative: 7 % (ref 3–12)
Neutro Abs: 2.4 10*3/uL (ref 1.7–7.7)
Neutrophils Relative %: 77 % (ref 43–77)
WBC: 3.1 10*3/uL — ABNORMAL LOW (ref 4.0–10.5)

## 2012-12-19 MED ORDER — SODIUM CHLORIDE 0.9 % IV SOLN
Freq: Once | INTRAVENOUS | Status: AC
  Administered 2012-12-19: 13:00:00 via INTRAVENOUS

## 2012-12-19 MED ORDER — HEPARIN SOD (PORK) LOCK FLUSH 100 UNIT/ML IV SOLN
INTRAVENOUS | Status: AC
  Filled 2012-12-19: qty 5

## 2012-12-19 MED ORDER — DIPHENHYDRAMINE HCL 50 MG/ML IJ SOLN
50.0000 mg | Freq: Once | INTRAMUSCULAR | Status: AC
  Administered 2012-12-19: 50 mg via INTRAVENOUS

## 2012-12-19 MED ORDER — SODIUM CHLORIDE 0.9 % IV SOLN
Freq: Once | INTRAVENOUS | Status: AC
  Administered 2012-12-19: 8 mg via INTRAVENOUS
  Filled 2012-12-19: qty 4

## 2012-12-19 MED ORDER — FAMOTIDINE IN NACL 20-0.9 MG/50ML-% IV SOLN
INTRAVENOUS | Status: AC
  Filled 2012-12-19: qty 50

## 2012-12-19 MED ORDER — DIPHENHYDRAMINE HCL 50 MG/ML IJ SOLN
INTRAMUSCULAR | Status: AC
  Filled 2012-12-19: qty 1

## 2012-12-19 MED ORDER — FAMOTIDINE IN NACL 20-0.9 MG/50ML-% IV SOLN
20.0000 mg | Freq: Once | INTRAVENOUS | Status: AC
  Administered 2012-12-19: 20 mg via INTRAVENOUS

## 2012-12-19 MED ORDER — HEPARIN SOD (PORK) LOCK FLUSH 100 UNIT/ML IV SOLN
500.0000 [IU] | Freq: Once | INTRAVENOUS | Status: AC | PRN
  Administered 2012-12-19: 500 [IU]
  Filled 2012-12-19: qty 5

## 2012-12-19 MED ORDER — PACLITAXEL CHEMO INJECTION 300 MG/50ML
80.0000 mg/m2 | Freq: Once | INTRAVENOUS | Status: AC
  Administered 2012-12-19: 156 mg via INTRAVENOUS
  Filled 2012-12-19: qty 26

## 2012-12-19 MED ORDER — SODIUM CHLORIDE 0.9 % IJ SOLN
10.0000 mL | INTRAMUSCULAR | Status: DC | PRN
  Administered 2012-12-19: 10 mL
  Filled 2012-12-19: qty 10

## 2012-12-19 MED ORDER — DIPHENHYDRAMINE HCL 25 MG PO CAPS
ORAL_CAPSULE | ORAL | Status: AC
  Filled 2012-12-19: qty 1

## 2012-12-19 NOTE — Progress Notes (Signed)
#  1. Stage III versus stage IV infiltrating ductal carcinoma of the left breast, ER 100% positive, PR percent positive, HER-2/neu nonamplified, Ki-67 of 34% with a very elevated cancer marker and a very positive PET scan suspicious for multiple metastases. Biopsy of her liver however revealed sarcoidosis only. #2. Sarcoidosis #3 excess weight #4 BRCA1 and BRCA2 negativity #5 abnormal right breast by MRI  She is tolerating the weekly paclitaxel very nicely. She feels it's easier than the prior therapy. 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 It is not disabling.  Vital signs are recorded. She is in no acute distress. She still has I think an axillary nodal mass' 2 x 3 cm. The left breast mass is approximately 4 x 6 cm. I am wondering if it's even larger than that because of the size of her breasts.  Clearly however it shrunken compared to presentation when was visible. The right breast is negative. Her Port-A-Cath is intact. Her lungs are clear. Skin exam is negative. She has no thyromegaly. Heart shows a regular rhythm and rate without murmur rub or gallop abdomen is soft and nontender without hepatosplenomegaly bowel sounds are normal she has no arm or leg edema.  She has opted to pursue bilateral mastectomies and I do think that's very appropriate.  I would like to repeat her PET scan on 02/13/2013. She was see Dr. Derrell Lolling soon thereafter for surgical exploration.

## 2012-12-19 NOTE — Patient Instructions (Addendum)
[facility] Discharge Instructions  RECOMMENDATIONS MADE BY THE CONSULTANT AND ANY TEST RESULTS WILL BE SENT TO YOUR REFERRING PHYSICIAN.  EXAM FINDINGS BY THE PHYSICIAN TODAY AND SIGNS OR SYMPTOMS TO REPORT TO CLINIC OR PRIMARY PHYSICIAN: Exam and discussion by MD. Tumor seems smaller by exam.  Stoppage of menses with taxotere is a good sign.  Plans are to complete chemotherapy, recheck your PET scan then surgery then radiation therapy.  MEDICATIONS PRESCRIBED:  none  INSTRUCTIONS GIVEN AND DISCUSSED: Report fevers, chills, uncontrolled nausea, vomiting, shortness of breath or other problems.  SPECIAL INSTRUCTIONS/FOLLOW-UP: Follow-up in 4 weeks.  Thank you for choosing [facility] to provide your oncology and hematology care.  To afford each patient quality time with our providers, please arrive at least 15 minutes before your scheduled appointment time.  With your help, our goal is to use those 15 minutes to complete the necessary work-up to ensure our physicians have the information they need to help with your evaluation and healthcare recommendations.    Effective January 1st, 2014, we ask that you re-schedule your appointment with our physicians should you arrive 10 or more minutes late for your appointment.  We strive to give you quality time with our providers, and arriving late affects you and other patients whose appointments are after yours.    Again, thank you for choosing [facility].  Our hope is that these requests will decrease the amount of time that you wait before being seen by our physicians.       _____________________________________________________________  Should you have questions after your visit to [facility], please contact our office at [phone] between the hours of 8:30 a.m. and 5:00 p.m.  Voicemails left after 4:30 p.m. will not be returned until the following business day.  For prescription  refill requests, have your pharmacy contact our office with your prescription refill request.

## 2012-12-19 NOTE — Progress Notes (Signed)
Tolerated chemo well. 

## 2012-12-20 ENCOUNTER — Telehealth (INDEPENDENT_AMBULATORY_CARE_PROVIDER_SITE_OTHER): Payer: Self-pay

## 2012-12-20 NOTE — Telephone Encounter (Signed)
I called and left the pt a message to call me regarding follow up after her pet scan.

## 2012-12-22 NOTE — Telephone Encounter (Signed)
I called and left the pt a message to call me.

## 2012-12-26 ENCOUNTER — Encounter (HOSPITAL_BASED_OUTPATIENT_CLINIC_OR_DEPARTMENT_OTHER): Payer: Medicaid Other

## 2012-12-26 VITALS — BP 138/83 | HR 114 | Temp 98.9°F | Resp 20 | Wt 198.0 lb

## 2012-12-26 DIAGNOSIS — C50919 Malignant neoplasm of unspecified site of unspecified female breast: Secondary | ICD-10-CM

## 2012-12-26 DIAGNOSIS — C50419 Malignant neoplasm of upper-outer quadrant of unspecified female breast: Secondary | ICD-10-CM

## 2012-12-26 DIAGNOSIS — Z5111 Encounter for antineoplastic chemotherapy: Secondary | ICD-10-CM

## 2012-12-26 DIAGNOSIS — C50912 Malignant neoplasm of unspecified site of left female breast: Secondary | ICD-10-CM

## 2012-12-26 LAB — CBC WITH DIFFERENTIAL/PLATELET
Basophils Absolute: 0 10*3/uL (ref 0.0–0.1)
Eosinophils Relative: 3 % (ref 0–5)
Lymphocytes Relative: 13 % (ref 12–46)
Lymphs Abs: 0.4 10*3/uL — ABNORMAL LOW (ref 0.7–4.0)
MCV: 81 fL (ref 78.0–100.0)
Neutro Abs: 2.3 10*3/uL (ref 1.7–7.7)
Neutrophils Relative %: 75 % (ref 43–77)
Platelets: 280 10*3/uL (ref 150–400)
RBC: 3.63 MIL/uL — ABNORMAL LOW (ref 3.87–5.11)
RDW: 17.8 % — ABNORMAL HIGH (ref 11.5–15.5)
WBC: 3.1 10*3/uL — ABNORMAL LOW (ref 4.0–10.5)

## 2012-12-26 MED ORDER — SODIUM CHLORIDE 0.9 % IV SOLN
Freq: Once | INTRAVENOUS | Status: AC
  Administered 2012-12-26: 12:00:00 via INTRAVENOUS

## 2012-12-26 MED ORDER — HEPARIN SOD (PORK) LOCK FLUSH 100 UNIT/ML IV SOLN
INTRAVENOUS | Status: AC
  Filled 2012-12-26: qty 5

## 2012-12-26 MED ORDER — DIPHENHYDRAMINE HCL 50 MG/ML IJ SOLN
50.0000 mg | Freq: Once | INTRAMUSCULAR | Status: AC
  Administered 2012-12-26: 50 mg via INTRAVENOUS

## 2012-12-26 MED ORDER — PACLITAXEL CHEMO INJECTION 300 MG/50ML
80.0000 mg/m2 | Freq: Once | INTRAVENOUS | Status: AC
  Administered 2012-12-26: 156 mg via INTRAVENOUS
  Filled 2012-12-26: qty 26

## 2012-12-26 MED ORDER — FAMOTIDINE IN NACL 20-0.9 MG/50ML-% IV SOLN
INTRAVENOUS | Status: AC
  Filled 2012-12-26: qty 50

## 2012-12-26 MED ORDER — DIPHENHYDRAMINE HCL 50 MG/ML IJ SOLN
INTRAMUSCULAR | Status: AC
  Filled 2012-12-26: qty 1

## 2012-12-26 MED ORDER — HEPARIN SOD (PORK) LOCK FLUSH 100 UNIT/ML IV SOLN
500.0000 [IU] | Freq: Once | INTRAVENOUS | Status: AC | PRN
  Administered 2012-12-26: 500 [IU]
  Filled 2012-12-26: qty 5

## 2012-12-26 MED ORDER — SODIUM CHLORIDE 0.9 % IV SOLN
Freq: Once | INTRAVENOUS | Status: AC
  Administered 2012-12-26: 8 mg via INTRAVENOUS
  Filled 2012-12-26: qty 4

## 2012-12-26 MED ORDER — FAMOTIDINE IN NACL 20-0.9 MG/50ML-% IV SOLN
20.0000 mg | Freq: Once | INTRAVENOUS | Status: AC
  Administered 2012-12-26: 20 mg via INTRAVENOUS

## 2013-01-02 ENCOUNTER — Encounter (HOSPITAL_BASED_OUTPATIENT_CLINIC_OR_DEPARTMENT_OTHER): Payer: Medicaid Other

## 2013-01-02 ENCOUNTER — Encounter (INDEPENDENT_AMBULATORY_CARE_PROVIDER_SITE_OTHER): Payer: Self-pay

## 2013-01-02 VITALS — BP 138/80 | HR 114 | Temp 98.0°F | Resp 16 | Wt 198.6 lb

## 2013-01-02 DIAGNOSIS — Z5111 Encounter for antineoplastic chemotherapy: Secondary | ICD-10-CM

## 2013-01-02 DIAGNOSIS — C50419 Malignant neoplasm of upper-outer quadrant of unspecified female breast: Secondary | ICD-10-CM

## 2013-01-02 DIAGNOSIS — C50912 Malignant neoplasm of unspecified site of left female breast: Secondary | ICD-10-CM

## 2013-01-02 DIAGNOSIS — C50919 Malignant neoplasm of unspecified site of unspecified female breast: Secondary | ICD-10-CM

## 2013-01-02 LAB — CBC WITH DIFFERENTIAL/PLATELET
Basophils Absolute: 0 10*3/uL (ref 0.0–0.1)
Basophils Relative: 0 % (ref 0–1)
HCT: 30.3 % — ABNORMAL LOW (ref 36.0–46.0)
Lymphocytes Relative: 10 % — ABNORMAL LOW (ref 12–46)
MCHC: 34.7 g/dL (ref 30.0–36.0)
Neutro Abs: 2.2 10*3/uL (ref 1.7–7.7)
Neutrophils Relative %: 75 % (ref 43–77)
Platelets: 272 10*3/uL (ref 150–400)
RDW: 17.3 % — ABNORMAL HIGH (ref 11.5–15.5)
WBC: 3 10*3/uL — ABNORMAL LOW (ref 4.0–10.5)

## 2013-01-02 MED ORDER — FAMOTIDINE IN NACL 20-0.9 MG/50ML-% IV SOLN
20.0000 mg | Freq: Once | INTRAVENOUS | Status: AC
  Administered 2013-01-02: 20 mg via INTRAVENOUS

## 2013-01-02 MED ORDER — SODIUM CHLORIDE 0.9 % IV SOLN
Freq: Once | INTRAVENOUS | Status: AC
  Administered 2013-01-02: 11:00:00 via INTRAVENOUS

## 2013-01-02 MED ORDER — HEPARIN SOD (PORK) LOCK FLUSH 100 UNIT/ML IV SOLN
500.0000 [IU] | Freq: Once | INTRAVENOUS | Status: AC | PRN
  Administered 2013-01-02: 500 [IU]
  Filled 2013-01-02: qty 5

## 2013-01-02 MED ORDER — FAMOTIDINE IN NACL 20-0.9 MG/50ML-% IV SOLN
INTRAVENOUS | Status: AC
  Filled 2013-01-02: qty 50

## 2013-01-02 MED ORDER — SODIUM CHLORIDE 0.9 % IJ SOLN
10.0000 mL | INTRAMUSCULAR | Status: DC | PRN
  Administered 2013-01-02: 10 mL
  Filled 2013-01-02: qty 10

## 2013-01-02 MED ORDER — HEPARIN SOD (PORK) LOCK FLUSH 100 UNIT/ML IV SOLN
INTRAVENOUS | Status: AC
  Filled 2013-01-02: qty 5

## 2013-01-02 MED ORDER — DIPHENHYDRAMINE HCL 50 MG/ML IJ SOLN
50.0000 mg | Freq: Once | INTRAMUSCULAR | Status: AC
  Administered 2013-01-02: 50 mg via INTRAVENOUS

## 2013-01-02 MED ORDER — PACLITAXEL CHEMO INJECTION 300 MG/50ML
80.0000 mg/m2 | Freq: Once | INTRAVENOUS | Status: AC
  Administered 2013-01-02: 156 mg via INTRAVENOUS
  Filled 2013-01-02: qty 26

## 2013-01-02 MED ORDER — DIPHENHYDRAMINE HCL 50 MG/ML IJ SOLN
INTRAMUSCULAR | Status: AC
  Filled 2013-01-02: qty 1

## 2013-01-02 MED ORDER — SODIUM CHLORIDE 0.9 % IV SOLN
Freq: Once | INTRAVENOUS | Status: AC
  Administered 2013-01-02: 8 mg via INTRAVENOUS
  Filled 2013-01-02: qty 4

## 2013-01-02 NOTE — Progress Notes (Signed)
Tolerated chemo well. 

## 2013-01-02 NOTE — Telephone Encounter (Signed)
I have mailed a letter to the patient regarding her appointment.

## 2013-01-10 ENCOUNTER — Encounter (HOSPITAL_BASED_OUTPATIENT_CLINIC_OR_DEPARTMENT_OTHER): Payer: Medicaid Other

## 2013-01-10 VITALS — BP 151/79 | HR 112 | Temp 98.6°F | Resp 16 | Wt 198.0 lb

## 2013-01-10 DIAGNOSIS — C50919 Malignant neoplasm of unspecified site of unspecified female breast: Secondary | ICD-10-CM

## 2013-01-10 DIAGNOSIS — Z5111 Encounter for antineoplastic chemotherapy: Secondary | ICD-10-CM

## 2013-01-10 DIAGNOSIS — C50419 Malignant neoplasm of upper-outer quadrant of unspecified female breast: Secondary | ICD-10-CM

## 2013-01-10 DIAGNOSIS — C7952 Secondary malignant neoplasm of bone marrow: Secondary | ICD-10-CM

## 2013-01-10 DIAGNOSIS — C50912 Malignant neoplasm of unspecified site of left female breast: Secondary | ICD-10-CM

## 2013-01-10 LAB — CBC WITH DIFFERENTIAL/PLATELET
Eosinophils Absolute: 0.1 10*3/uL (ref 0.0–0.7)
Eosinophils Relative: 2 % (ref 0–5)
Lymphs Abs: 0.4 10*3/uL — ABNORMAL LOW (ref 0.7–4.0)
MCH: 28.7 pg (ref 26.0–34.0)
MCHC: 34.8 g/dL (ref 30.0–36.0)
MCV: 82.4 fL (ref 78.0–100.0)
Monocytes Relative: 16 % — ABNORMAL HIGH (ref 3–12)
Platelets: 266 10*3/uL (ref 150–400)
RBC: 3.52 MIL/uL — ABNORMAL LOW (ref 3.87–5.11)

## 2013-01-10 LAB — COMPREHENSIVE METABOLIC PANEL
ALT: 33 U/L (ref 0–35)
AST: 26 U/L (ref 0–37)
Calcium: 9.5 mg/dL (ref 8.4–10.5)
Creatinine, Ser: 0.77 mg/dL (ref 0.50–1.10)
GFR calc Af Amer: >90 mL/min (ref >90)
GFR calc non Af Amer: >90 mL/min (ref >90)
Sodium: 139 mEq/L (ref 135–145)
Total Protein: 6.9 g/dL (ref 6.0–8.3)

## 2013-01-10 MED ORDER — SODIUM CHLORIDE 0.9 % IJ SOLN
10.0000 mL | INTRAMUSCULAR | Status: DC | PRN
  Administered 2013-01-10: 10 mL
  Filled 2013-01-10: qty 10

## 2013-01-10 MED ORDER — ONDANSETRON HCL 40 MG/20ML IJ SOLN
Freq: Once | INTRAMUSCULAR | Status: AC
  Administered 2013-01-10: 8 mg via INTRAVENOUS
  Filled 2013-01-10: qty 4

## 2013-01-10 MED ORDER — DIPHENHYDRAMINE HCL 50 MG/ML IJ SOLN
INTRAMUSCULAR | Status: AC
  Filled 2013-01-10: qty 1

## 2013-01-10 MED ORDER — PACLITAXEL CHEMO INJECTION 300 MG/50ML
80.0000 mg/m2 | Freq: Once | INTRAVENOUS | Status: AC
  Administered 2013-01-10: 156 mg via INTRAVENOUS
  Filled 2013-01-10: qty 26

## 2013-01-10 MED ORDER — HEPARIN SOD (PORK) LOCK FLUSH 100 UNIT/ML IV SOLN
500.0000 [IU] | Freq: Once | INTRAVENOUS | Status: AC | PRN
  Administered 2013-01-10: 500 [IU]
  Filled 2013-01-10: qty 5

## 2013-01-10 MED ORDER — DIPHENHYDRAMINE HCL 50 MG/ML IJ SOLN
50.0000 mg | Freq: Once | INTRAMUSCULAR | Status: AC
  Administered 2013-01-10: 50 mg via INTRAVENOUS

## 2013-01-10 MED ORDER — FAMOTIDINE IN NACL 20-0.9 MG/50ML-% IV SOLN
INTRAVENOUS | Status: AC
  Filled 2013-01-10: qty 50

## 2013-01-10 MED ORDER — SODIUM CHLORIDE 0.9 % IV SOLN
Freq: Once | INTRAVENOUS | Status: AC
  Administered 2013-01-10: 11:00:00 via INTRAVENOUS

## 2013-01-10 MED ORDER — FAMOTIDINE IN NACL 20-0.9 MG/50ML-% IV SOLN
20.0000 mg | Freq: Once | INTRAVENOUS | Status: AC
  Administered 2013-01-10: 20 mg via INTRAVENOUS

## 2013-01-10 MED ORDER — HEPARIN SOD (PORK) LOCK FLUSH 100 UNIT/ML IV SOLN
INTRAVENOUS | Status: AC
  Filled 2013-01-10: qty 5

## 2013-01-10 NOTE — Progress Notes (Signed)
Tolerated chemo well. 

## 2013-01-16 ENCOUNTER — Encounter (HOSPITAL_BASED_OUTPATIENT_CLINIC_OR_DEPARTMENT_OTHER): Payer: Medicaid Other

## 2013-01-16 ENCOUNTER — Encounter (HOSPITAL_COMMUNITY): Payer: Self-pay | Admitting: Oncology

## 2013-01-16 ENCOUNTER — Encounter (HOSPITAL_COMMUNITY): Payer: Medicaid Other | Attending: Oncology | Admitting: Oncology

## 2013-01-16 VITALS — BP 144/90 | HR 111 | Temp 98.1°F | Resp 18 | Wt 197.4 lb

## 2013-01-16 DIAGNOSIS — C50912 Malignant neoplasm of unspecified site of left female breast: Secondary | ICD-10-CM

## 2013-01-16 DIAGNOSIS — G609 Hereditary and idiopathic neuropathy, unspecified: Secondary | ICD-10-CM

## 2013-01-16 DIAGNOSIS — C50419 Malignant neoplasm of upper-outer quadrant of unspecified female breast: Secondary | ICD-10-CM

## 2013-01-16 DIAGNOSIS — C7951 Secondary malignant neoplasm of bone: Secondary | ICD-10-CM

## 2013-01-16 DIAGNOSIS — C50919 Malignant neoplasm of unspecified site of unspecified female breast: Secondary | ICD-10-CM

## 2013-01-16 DIAGNOSIS — Z5111 Encounter for antineoplastic chemotherapy: Secondary | ICD-10-CM

## 2013-01-16 DIAGNOSIS — C7952 Secondary malignant neoplasm of bone marrow: Secondary | ICD-10-CM

## 2013-01-16 DIAGNOSIS — C800 Disseminated malignant neoplasm, unspecified: Secondary | ICD-10-CM | POA: Insufficient documentation

## 2013-01-16 LAB — CBC WITH DIFFERENTIAL/PLATELET
Basophils Absolute: 0 10*3/uL (ref 0.0–0.1)
Basophils Relative: 2 % — ABNORMAL HIGH (ref 0–1)
Eosinophils Absolute: 0.1 10*3/uL (ref 0.0–0.7)
Eosinophils Relative: 2 % (ref 0–5)
HCT: 30 % — ABNORMAL LOW (ref 36.0–46.0)
Hemoglobin: 10.3 g/dL — ABNORMAL LOW (ref 12.0–15.0)
Lymphocytes Relative: 13 % (ref 12–46)
Lymphs Abs: 0.3 10*3/uL — ABNORMAL LOW (ref 0.7–4.0)
MCH: 28.3 pg (ref 26.0–34.0)
MCHC: 34.3 g/dL (ref 30.0–36.0)
MCV: 82.4 fL (ref 78.0–100.0)
Monocytes Absolute: 0.2 10*3/uL (ref 0.1–1.0)
Monocytes Relative: 9 % (ref 3–12)
Neutro Abs: 1.9 10*3/uL (ref 1.7–7.7)
Neutrophils Relative %: 74 % (ref 43–77)
Platelets: 263 10*3/uL (ref 150–400)
RBC: 3.64 MIL/uL — ABNORMAL LOW (ref 3.87–5.11)
RDW: 15.7 % — ABNORMAL HIGH (ref 11.5–15.5)
WBC: 2.6 10*3/uL — ABNORMAL LOW (ref 4.0–10.5)

## 2013-01-16 MED ORDER — HEPARIN SOD (PORK) LOCK FLUSH 100 UNIT/ML IV SOLN
INTRAVENOUS | Status: AC
  Filled 2013-01-16: qty 5

## 2013-01-16 MED ORDER — FAMOTIDINE IN NACL 20-0.9 MG/50ML-% IV SOLN
INTRAVENOUS | Status: AC
  Filled 2013-01-16: qty 50

## 2013-01-16 MED ORDER — PACLITAXEL CHEMO INJECTION 300 MG/50ML
80.0000 mg/m2 | Freq: Once | INTRAVENOUS | Status: AC
  Administered 2013-01-16: 156 mg via INTRAVENOUS
  Filled 2013-01-16: qty 26

## 2013-01-16 MED ORDER — DIPHENHYDRAMINE HCL 50 MG/ML IJ SOLN
INTRAMUSCULAR | Status: AC
  Filled 2013-01-16: qty 1

## 2013-01-16 MED ORDER — SODIUM CHLORIDE 0.9 % IJ SOLN
10.0000 mL | INTRAMUSCULAR | Status: DC | PRN
  Administered 2013-01-16: 10 mL
  Filled 2013-01-16: qty 10

## 2013-01-16 MED ORDER — SODIUM CHLORIDE 0.9 % IV SOLN
Freq: Once | INTRAVENOUS | Status: AC
  Administered 2013-01-16: 13:00:00 via INTRAVENOUS

## 2013-01-16 MED ORDER — HEPARIN SOD (PORK) LOCK FLUSH 100 UNIT/ML IV SOLN
500.0000 [IU] | Freq: Once | INTRAVENOUS | Status: AC | PRN
  Administered 2013-01-16: 500 [IU]
  Filled 2013-01-16: qty 5

## 2013-01-16 MED ORDER — DIPHENHYDRAMINE HCL 50 MG/ML IJ SOLN
50.0000 mg | Freq: Once | INTRAMUSCULAR | Status: AC
  Administered 2013-01-16: 50 mg via INTRAVENOUS

## 2013-01-16 MED ORDER — SODIUM CHLORIDE 0.9 % IV SOLN
Freq: Once | INTRAVENOUS | Status: AC
  Administered 2013-01-16: 8 mg via INTRAVENOUS
  Filled 2013-01-16: qty 4

## 2013-01-16 MED ORDER — FAMOTIDINE IN NACL 20-0.9 MG/50ML-% IV SOLN
20.0000 mg | Freq: Once | INTRAVENOUS | Status: AC
  Administered 2013-01-16: 20 mg via INTRAVENOUS

## 2013-01-16 NOTE — Progress Notes (Signed)
Tolerated cshemo well today.

## 2013-01-16 NOTE — Progress Notes (Signed)
#  1 stage III versus stage IV infiltrating ductal carcinoma of the left breast, ER 100% positive, PR 30% positive, HER-2/neu nonamplified, Ki-67 marker 34%. She is now status post 4 cycles of dose dense Adriamycin and Cytoxan followed by weekly Taxol and today is cycle 9 of a planned 12 doses. Her followup PET scan is scheduled for 02/13/2013.  Her weight is stable. The left breast masses no bigger than 4 x 4 cm presently. I cannot definitively feel a left axillary mass. There is no distinct supraclavicular or infraclavicular masses.  Heart shows a regular rhythm and rate. Lungs are clear. She has no leg edema. He probably does have grade 1 peripheral neuropathy symptomatology.  Will finish therapy get her PET scan and still plan on doing bilateral mastectomies with eventually reconstruction.  #2 sarcoidosis biopsy proven within the liver confusing the issue of whether she has metastatic disease #3 BRCA1 and BRCA2 negativity #4 excess weight #5 abnormal right breast by MRI  She really is doing quite well and feels great. We will see what the scans show and I will discuss with Dr. Derrell Lolling immediately thereafter.

## 2013-01-16 NOTE — Patient Instructions (Addendum)
[facility] Discharge Instructions  RECOMMENDATIONS MADE BY THE CONSULTANT AND ANY TEST RESULTS WILL BE SENT TO YOUR REFERRING PHYSICIAN.  EXAM FINDINGS BY THE PHYSICIAN TODAY AND SIGNS OR SYMPTOMS TO REPORT TO CLINIC OR PRIMARY PHYSICIAN: Exam and discussion by MD.  Bonita Quin are doing well.  Your tumor is continuing to shrink.  MEDICATIONS PRESCRIBED:  none  INSTRUCTIONS GIVEN AND DISCUSSED: Report fevers, chills, uncontrolled nausea or vomiting, any new lumps, bone pain or shortness of breath.  SPECIAL INSTRUCTIONS/FOLLOW-UP: Follow-up after PET scan.  Thank you for choosing [facility] to provide your oncology and hematology care.  To afford each patient quality time with our providers, please arrive at least 15 minutes before your scheduled appointment time.  With your help, our goal is to use those 15 minutes to complete the necessary work-up to ensure our physicians have the information they need to help with your evaluation and healthcare recommendations.    Effective January 1st, 2014, we ask that you re-schedule your appointment with our physicians should you arrive 10 or more minutes late for your appointment.  We strive to give you quality time with our providers, and arriving late affects you and other patients whose appointments are after yours.    Again, thank you for choosing [facility].  Our hope is that these requests will decrease the amount of time that you wait before being seen by our physicians.       _____________________________________________________________  Should you have questions after your visit to [facility], please contact our office at [phone] between the hours of 8:30 a.m. and 5:00 p.m.  Voicemails left after 4:30 p.m. will not be returned until the following business day.  For prescription refill requests, have your pharmacy contact our office with your prescription refill request.

## 2013-01-23 ENCOUNTER — Encounter (HOSPITAL_COMMUNITY): Payer: Medicaid Other | Attending: Hematology and Oncology

## 2013-01-23 VITALS — BP 154/80 | HR 107 | Temp 98.1°F | Resp 16 | Wt 198.2 lb

## 2013-01-23 DIAGNOSIS — Z5111 Encounter for antineoplastic chemotherapy: Secondary | ICD-10-CM

## 2013-01-23 DIAGNOSIS — C7952 Secondary malignant neoplasm of bone marrow: Secondary | ICD-10-CM

## 2013-01-23 DIAGNOSIS — C50419 Malignant neoplasm of upper-outer quadrant of unspecified female breast: Secondary | ICD-10-CM

## 2013-01-23 DIAGNOSIS — C50912 Malignant neoplasm of unspecified site of left female breast: Secondary | ICD-10-CM

## 2013-01-23 DIAGNOSIS — C800 Disseminated malignant neoplasm, unspecified: Secondary | ICD-10-CM | POA: Insufficient documentation

## 2013-01-23 DIAGNOSIS — C50919 Malignant neoplasm of unspecified site of unspecified female breast: Secondary | ICD-10-CM | POA: Insufficient documentation

## 2013-01-23 DIAGNOSIS — C7951 Secondary malignant neoplasm of bone: Secondary | ICD-10-CM

## 2013-01-23 LAB — CBC WITH DIFFERENTIAL/PLATELET
HCT: 31.1 % — ABNORMAL LOW (ref 36.0–46.0)
Hemoglobin: 10.4 g/dL — ABNORMAL LOW (ref 12.0–15.0)
Lymphocytes Relative: 13 % (ref 12–46)
Lymphs Abs: 0.3 10*3/uL — ABNORMAL LOW (ref 0.7–4.0)
Monocytes Absolute: 0.3 10*3/uL (ref 0.1–1.0)
Monocytes Relative: 13 % — ABNORMAL HIGH (ref 3–12)
Neutro Abs: 1.6 10*3/uL — ABNORMAL LOW (ref 1.7–7.7)
Neutrophils Relative %: 71 % (ref 43–77)
RBC: 3.69 MIL/uL — ABNORMAL LOW (ref 3.87–5.11)
WBC: 2.3 10*3/uL — ABNORMAL LOW (ref 4.0–10.5)

## 2013-01-23 MED ORDER — FAMOTIDINE IN NACL 20-0.9 MG/50ML-% IV SOLN
INTRAVENOUS | Status: AC
  Filled 2013-01-23: qty 50

## 2013-01-23 MED ORDER — DIPHENHYDRAMINE HCL 50 MG/ML IJ SOLN
50.0000 mg | Freq: Once | INTRAMUSCULAR | Status: AC
  Administered 2013-01-23: 50 mg via INTRAVENOUS

## 2013-01-23 MED ORDER — HEPARIN SOD (PORK) LOCK FLUSH 100 UNIT/ML IV SOLN
500.0000 [IU] | Freq: Once | INTRAVENOUS | Status: AC | PRN
  Administered 2013-01-23: 500 [IU]
  Filled 2013-01-23: qty 5

## 2013-01-23 MED ORDER — DIPHENHYDRAMINE HCL 50 MG/ML IJ SOLN
INTRAMUSCULAR | Status: AC
  Filled 2013-01-23: qty 1

## 2013-01-23 MED ORDER — SODIUM CHLORIDE 0.9 % IV SOLN
Freq: Once | INTRAVENOUS | Status: AC
  Administered 2013-01-23: 8 mg via INTRAVENOUS
  Filled 2013-01-23: qty 4

## 2013-01-23 MED ORDER — FAMOTIDINE IN NACL 20-0.9 MG/50ML-% IV SOLN
20.0000 mg | Freq: Once | INTRAVENOUS | Status: AC
  Administered 2013-01-23: 20 mg via INTRAVENOUS

## 2013-01-23 MED ORDER — SODIUM CHLORIDE 0.9 % IJ SOLN
10.0000 mL | INTRAMUSCULAR | Status: DC | PRN
Start: 2013-01-23 — End: 2013-01-23
  Administered 2013-01-23: 10 mL
  Filled 2013-01-23: qty 10

## 2013-01-23 MED ORDER — SODIUM CHLORIDE 0.9 % IV SOLN
Freq: Once | INTRAVENOUS | Status: AC
  Administered 2013-01-23: 11:00:00 via INTRAVENOUS

## 2013-01-23 MED ORDER — PACLITAXEL CHEMO INJECTION 300 MG/50ML
80.0000 mg/m2 | Freq: Once | INTRAVENOUS | Status: AC
  Administered 2013-01-23: 156 mg via INTRAVENOUS
  Filled 2013-01-23: qty 26

## 2013-01-23 NOTE — Progress Notes (Signed)
Tolerated chemo well. 

## 2013-01-26 ENCOUNTER — Telehealth (HOSPITAL_COMMUNITY): Payer: Self-pay | Admitting: Oncology

## 2013-01-26 NOTE — Telephone Encounter (Signed)
Brenda Avery contacted and advised her chemo was rescheduled on 01/30/13 - she knows to come in at 1200.

## 2013-01-30 ENCOUNTER — Encounter (HOSPITAL_BASED_OUTPATIENT_CLINIC_OR_DEPARTMENT_OTHER): Payer: Medicaid Other

## 2013-01-30 VITALS — BP 124/54 | HR 67 | Temp 98.0°F | Resp 20 | Wt 199.0 lb

## 2013-01-30 DIAGNOSIS — Z5111 Encounter for antineoplastic chemotherapy: Secondary | ICD-10-CM

## 2013-01-30 DIAGNOSIS — C7952 Secondary malignant neoplasm of bone marrow: Secondary | ICD-10-CM

## 2013-01-30 DIAGNOSIS — C50419 Malignant neoplasm of upper-outer quadrant of unspecified female breast: Secondary | ICD-10-CM

## 2013-01-30 DIAGNOSIS — C50912 Malignant neoplasm of unspecified site of left female breast: Secondary | ICD-10-CM

## 2013-01-30 DIAGNOSIS — C50919 Malignant neoplasm of unspecified site of unspecified female breast: Secondary | ICD-10-CM

## 2013-01-30 LAB — CBC WITH DIFFERENTIAL/PLATELET
Basophils Absolute: 0 10*3/uL (ref 0.0–0.1)
Basophils Relative: 1 % (ref 0–1)
Eosinophils Relative: 3 % (ref 0–5)
Lymphocytes Relative: 16 % (ref 12–46)
MCHC: 34.3 g/dL (ref 30.0–36.0)
MCV: 84.3 fL (ref 78.0–100.0)
Monocytes Absolute: 0.2 10*3/uL (ref 0.1–1.0)
Neutro Abs: 1.3 10*3/uL — ABNORMAL LOW (ref 1.7–7.7)
Platelets: 253 10*3/uL (ref 150–400)
RDW: 14.7 % (ref 11.5–15.5)
WBC: 1.9 10*3/uL — ABNORMAL LOW (ref 4.0–10.5)

## 2013-01-30 MED ORDER — HEPARIN SOD (PORK) LOCK FLUSH 100 UNIT/ML IV SOLN
INTRAVENOUS | Status: AC
  Filled 2013-01-30: qty 5

## 2013-01-30 MED ORDER — HEPARIN SOD (PORK) LOCK FLUSH 100 UNIT/ML IV SOLN
500.0000 [IU] | Freq: Once | INTRAVENOUS | Status: AC | PRN
  Administered 2013-01-30: 500 [IU]
  Filled 2013-01-30: qty 5

## 2013-01-30 MED ORDER — FAMOTIDINE IN NACL 20-0.9 MG/50ML-% IV SOLN
INTRAVENOUS | Status: AC
  Filled 2013-01-30: qty 50

## 2013-01-30 MED ORDER — DIPHENHYDRAMINE HCL 50 MG/ML IJ SOLN
50.0000 mg | Freq: Once | INTRAMUSCULAR | Status: AC
  Administered 2013-01-30: 50 mg via INTRAVENOUS

## 2013-01-30 MED ORDER — SODIUM CHLORIDE 0.9 % IV SOLN
Freq: Once | INTRAVENOUS | Status: AC
  Administered 2013-01-30: 13:00:00 via INTRAVENOUS

## 2013-01-30 MED ORDER — SODIUM CHLORIDE 0.9 % IV SOLN
Freq: Once | INTRAVENOUS | Status: AC
  Administered 2013-01-30: 8 mg via INTRAVENOUS
  Filled 2013-01-30: qty 4

## 2013-01-30 MED ORDER — DEXTROSE 5 % IV SOLN
80.0000 mg/m2 | Freq: Once | INTRAVENOUS | Status: AC
  Administered 2013-01-30: 156 mg via INTRAVENOUS
  Filled 2013-01-30: qty 26

## 2013-01-30 MED ORDER — DIPHENHYDRAMINE HCL 50 MG/ML IJ SOLN
INTRAMUSCULAR | Status: AC
  Filled 2013-01-30: qty 1

## 2013-01-30 MED ORDER — FAMOTIDINE IN NACL 20-0.9 MG/50ML-% IV SOLN
20.0000 mg | Freq: Once | INTRAVENOUS | Status: AC
  Administered 2013-01-30: 20 mg via INTRAVENOUS

## 2013-01-30 NOTE — Progress Notes (Signed)
TJacalyn Lefevre, PA-C advised of pt's CBC w/ diff results - advised to proceed with treatment today.  Chemo and premeds released.  Luevenia Maxin advised on neutropenic precautions and verbalized understanding.

## 2013-01-30 NOTE — Progress Notes (Signed)
Tolerated well

## 2013-01-31 MED ORDER — HEPARIN SOD (PORK) LOCK FLUSH 100 UNIT/ML IV SOLN
INTRAVENOUS | Status: AC
  Filled 2013-01-31: qty 5

## 2013-02-06 ENCOUNTER — Encounter (HOSPITAL_BASED_OUTPATIENT_CLINIC_OR_DEPARTMENT_OTHER): Payer: Medicaid Other

## 2013-02-06 VITALS — BP 139/88 | HR 101 | Temp 97.8°F | Resp 16 | Wt 198.6 lb

## 2013-02-06 DIAGNOSIS — C50912 Malignant neoplasm of unspecified site of left female breast: Secondary | ICD-10-CM

## 2013-02-06 DIAGNOSIS — Z5111 Encounter for antineoplastic chemotherapy: Secondary | ICD-10-CM

## 2013-02-06 DIAGNOSIS — C50419 Malignant neoplasm of upper-outer quadrant of unspecified female breast: Secondary | ICD-10-CM

## 2013-02-06 DIAGNOSIS — C7952 Secondary malignant neoplasm of bone marrow: Secondary | ICD-10-CM

## 2013-02-06 DIAGNOSIS — C50919 Malignant neoplasm of unspecified site of unspecified female breast: Secondary | ICD-10-CM

## 2013-02-06 LAB — COMPREHENSIVE METABOLIC PANEL
BUN: 13 mg/dL (ref 6–23)
CO2: 26 mEq/L (ref 19–32)
Calcium: 9.3 mg/dL (ref 8.4–10.5)
Chloride: 103 mEq/L (ref 96–112)
Creatinine, Ser: 0.75 mg/dL (ref 0.50–1.10)
GFR calc Af Amer: >90 mL/min (ref >90)
GFR calc non Af Amer: >90 mL/min (ref >90)
Total Bilirubin: 0.3 mg/dL (ref 0.3–1.2)

## 2013-02-06 LAB — CBC WITH DIFFERENTIAL/PLATELET
Eosinophils Absolute: 0 10*3/uL (ref 0.0–0.7)
Lymphocytes Relative: 15 % (ref 12–46)
Lymphs Abs: 0.3 10*3/uL — ABNORMAL LOW (ref 0.7–4.0)
Neutro Abs: 1.4 10*3/uL — ABNORMAL LOW (ref 1.7–7.7)
Neutrophils Relative %: 67 % (ref 43–77)
Platelets: 236 10*3/uL (ref 150–400)
RBC: 3.67 MIL/uL — ABNORMAL LOW (ref 3.87–5.11)
WBC: 2.1 10*3/uL — ABNORMAL LOW (ref 4.0–10.5)

## 2013-02-06 MED ORDER — FAMOTIDINE IN NACL 20-0.9 MG/50ML-% IV SOLN
20.0000 mg | Freq: Once | INTRAVENOUS | Status: AC
  Administered 2013-02-06: 20 mg via INTRAVENOUS

## 2013-02-06 MED ORDER — SODIUM CHLORIDE 0.9 % IV SOLN
Freq: Once | INTRAVENOUS | Status: AC
  Administered 2013-02-06: 12:00:00 via INTRAVENOUS

## 2013-02-06 MED ORDER — PACLITAXEL CHEMO INJECTION 300 MG/50ML
80.0000 mg/m2 | Freq: Once | INTRAVENOUS | Status: AC
  Administered 2013-02-06: 156 mg via INTRAVENOUS
  Filled 2013-02-06: qty 26

## 2013-02-06 MED ORDER — SODIUM CHLORIDE 0.9 % IJ SOLN
10.0000 mL | INTRAMUSCULAR | Status: DC | PRN
  Administered 2013-02-06: 10 mL
  Filled 2013-02-06: qty 10

## 2013-02-06 MED ORDER — DIPHENHYDRAMINE HCL 50 MG/ML IJ SOLN
INTRAMUSCULAR | Status: AC
  Filled 2013-02-06: qty 1

## 2013-02-06 MED ORDER — DIPHENHYDRAMINE HCL 50 MG/ML IJ SOLN
50.0000 mg | Freq: Once | INTRAMUSCULAR | Status: AC
  Administered 2013-02-06: 50 mg via INTRAVENOUS

## 2013-02-06 MED ORDER — HEPARIN SOD (PORK) LOCK FLUSH 100 UNIT/ML IV SOLN
500.0000 [IU] | Freq: Once | INTRAVENOUS | Status: AC | PRN
  Administered 2013-02-06: 500 [IU]
  Filled 2013-02-06: qty 5

## 2013-02-06 MED ORDER — SODIUM CHLORIDE 0.9 % IV SOLN
Freq: Once | INTRAVENOUS | Status: AC
  Administered 2013-02-06: 8 mg via INTRAVENOUS
  Filled 2013-02-06: qty 4

## 2013-02-06 MED ORDER — FAMOTIDINE IN NACL 20-0.9 MG/50ML-% IV SOLN
INTRAVENOUS | Status: AC
  Filled 2013-02-06: qty 50

## 2013-02-06 NOTE — Progress Notes (Signed)
Tolerated chemotherapy well. 

## 2013-02-07 ENCOUNTER — Encounter (HOSPITAL_COMMUNITY): Payer: Medicaid Other

## 2013-02-13 ENCOUNTER — Encounter (HOSPITAL_COMMUNITY)
Admission: RE | Admit: 2013-02-13 | Discharge: 2013-02-13 | Disposition: A | Discharge status: Home / Self Care | Payer: Medicaid Other | Source: Ambulatory Visit | Attending: Oncology | Admitting: Oncology

## 2013-02-13 DIAGNOSIS — R599 Enlarged lymph nodes, unspecified: Secondary | ICD-10-CM | POA: Insufficient documentation

## 2013-02-13 DIAGNOSIS — M8530 Osteitis condensans, unspecified site: Secondary | ICD-10-CM | POA: Insufficient documentation

## 2013-02-13 DIAGNOSIS — C50912 Malignant neoplasm of unspecified site of left female breast: Secondary | ICD-10-CM

## 2013-02-13 DIAGNOSIS — C787 Secondary malignant neoplasm of liver and intrahepatic bile duct: Secondary | ICD-10-CM | POA: Insufficient documentation

## 2013-02-13 DIAGNOSIS — C50919 Malignant neoplasm of unspecified site of unspecified female breast: Secondary | ICD-10-CM | POA: Insufficient documentation

## 2013-02-13 MED ORDER — FLUDEOXYGLUCOSE F - 18 (FDG) INJECTION
18.2000 | Freq: Once | INTRAVENOUS | Status: AC | PRN
  Administered 2013-02-13: 18.2 via INTRAVENOUS

## 2013-02-14 ENCOUNTER — Encounter (HOSPITAL_BASED_OUTPATIENT_CLINIC_OR_DEPARTMENT_OTHER): Payer: Medicaid Other

## 2013-02-14 ENCOUNTER — Encounter (HOSPITAL_COMMUNITY): Payer: Self-pay

## 2013-02-14 VITALS — BP 117/78 | HR 101 | Temp 98.6°F | Resp 16 | Wt 201.0 lb

## 2013-02-14 DIAGNOSIS — C50912 Malignant neoplasm of unspecified site of left female breast: Secondary | ICD-10-CM

## 2013-02-14 DIAGNOSIS — C800 Disseminated malignant neoplasm, unspecified: Secondary | ICD-10-CM

## 2013-02-14 DIAGNOSIS — C50919 Malignant neoplasm of unspecified site of unspecified female breast: Secondary | ICD-10-CM

## 2013-02-14 NOTE — Progress Notes (Signed)
Patient returns to clinic today for further evaluation. She completed her chemotherapy approximately one week ago and tolerated it well with only some minor peripheral neuropathy in her bilateral hands and feet. This does not affect her day-to-day activity.  Her only complaint today is of a minor cough, but otherwise feels well. She has no other neurologic complaints. She denies any recent fevers. She has a good appetite and denies weight loss. She has no chest pain or shortness of breath. She denies any nausea, vomiting, constipation, or diarrhea. She has no urinary complaints. Patient otherwise feels well and offers no further specific complaints.    GENERAL: No distress, well nourished.  SKIN:  No rashes or significant lesions noted HEAD: Normocephalic, No masses, lesions, or abnormalities  EYES: Conjunctiva are pink and non-injected  BREAST: Exam deferred today.  LUNGS: Clear to auscultation, no crackles or wheezes HEART: Regular rate & rhythm, no murmurs, no gallops, S1 normal and S2 normal  ABDOMEN: Abdomen soft, non-tender, normal bowel sounds  EXTREMITIES: No edema, no skin discoloration or tenderness NEURO: Alert & oriented, no focal motor/sensory deficits.  #1 Stage III versus stage IV infiltrating ductal carcinoma of the left breast, ER 100% positive, PR 30% positive, HER-2/neu nonamplified, Ki-67 marker 34%. She is now status post 4 cycles of dose dense Adriamycin and Cytoxan and 12 cycles of weekly Taxol. PET scan from 02/13/2013 revealed a significant, but incomplete interval metabolic response to therapy. There was also no evidence of new or progressive disease. Patient has an appointment with her surgeon on 02/16/2013 to discuss her surgical options. I have instructed her to return to clinic in one month to determine whether she will require adjuvant XRT or possibly initiate tamoxifen at that time.   #2 sarcoidosis biopsy proven within the liver confusing the issue of whether she has  metastatic disease  #3 BRCA1 and BRCA2 negativity  #4 excess weight  #5 abnormal right breast by MRI #6 cough: Patient was given a prescription for Tessalon Perles.

## 2013-02-14 NOTE — Patient Instructions (Addendum)
[facility] Discharge Instructions  RECOMMENDATIONS MADE BY THE CONSULTANT AND ANY TEST RESULTS WILL BE SENT TO YOUR REFERRING PHYSICIAN.  EXAM FINDINGS BY THE PHYSICIAN TODAY AND SIGNS OR SYMPTOMS TO REPORT TO CLINIC OR PRIMARY PHYSICIAN: Exam and discussion by MD.  Brenda Avery are doing well.  Scan was greatly improved.  See the surgeon and let us know what the surgical plans are.  MEDICATIONS PRESCRIBED:  Tessalon Pearls take 1 twice daily as needed for cough.  INSTRUCTIONS GIVEN AND DISCUSSED: Report any new lumps, bone pain, shortness of breath or other symptoms.  SPECIAL INSTRUCTIONS/FOLLOW-UP: Follow-up in 4 weeks.  Thank you for choosing [facility] to provide your oncology and hematology care.  To afford each patient quality time with our providers, please arrive at least 15 minutes before your scheduled appointment time.  With your help, our goal is to use those 15 minutes to complete the necessary work-up to ensure our physicians have the information they need to help with your evaluation and healthcare recommendations.    Effective January 1st, 2014, we ask that you re-schedule your appointment with our physicians should you arrive 10 or more minutes late for your appointment.  We strive to give you quality time with our providers, and arriving late affects you and other patients whose appointments are after yours.    Again, thank you for choosing [facility].  Our hope is that these requests will decrease the amount of time that you wait before being seen by our physicians.       _____________________________________________________________  Should you have questions after your visit to [facility], please contact our office at [phone] between the hours of 8:30 a.m. and 5:00 p.m.  Voicemails left after 4:30 p.m. will not be returned until the following business day.  For prescription refill requests, have your pharmacy  contact our office with your prescription refill request.

## 2013-02-16 ENCOUNTER — Encounter (INDEPENDENT_AMBULATORY_CARE_PROVIDER_SITE_OTHER): Payer: Self-pay | Admitting: General Surgery

## 2013-02-16 ENCOUNTER — Ambulatory Visit (INDEPENDENT_AMBULATORY_CARE_PROVIDER_SITE_OTHER): Payer: Medicaid Other | Admitting: General Surgery

## 2013-02-16 VITALS — BP 129/83 | HR 70 | Temp 97.8°F | Resp 12 | Ht 64.0 in | Wt 199.8 lb

## 2013-02-16 DIAGNOSIS — C50912 Malignant neoplasm of unspecified site of left female breast: Secondary | ICD-10-CM

## 2013-02-16 DIAGNOSIS — C50919 Malignant neoplasm of unspecified site of unspecified female breast: Secondary | ICD-10-CM

## 2013-02-16 NOTE — Progress Notes (Signed)
Patient ID: Brenda Avery, female   DOB: 03/13/1971, 42 y.o.   MRN: [id number]  Chief Complaint  Patient presents with  . Other    HPI Brenda Avery is a 42 y.o. female.  She returns following neoadjuvant chemotherapy to discuss definitive breast surgery. Dr. Mariel Sleet is no longer working and TPN. She has seen an interim oncologist named Dr. Christian Mate   Her initial MRI suggested more extensive disease in the left and 2 masses in the right breast, which were not suspected initially.   Once the liver biopsy showed sarcoidosis, it became apparent that it is possible she does not have stage IV disease. We went ahead with bilateral breast MRI at that time, knowing that this was not baseline since she had received her first cycle of chemo. This showed marked bilateral parenchymal enhancement. 2 biopsy marker clips were seen on the left but there was extensive breast cancer involving multiple quadrants on the left, left upper outer quadrant and left lower outer quadrant and also suspicious for chest wall involvement, possibly or possible axillary node involvement and sternal metastasis. Hopefully this is sarcoid. The right breast showed 2 small masses, 18 mm and 9 mm at the 10:00 position. To biopsy the right side an ultrasound would need to be performed, and possibly MRI biopsy would need to be performed.  In April, she declined to have any further biopsies on the right, stating that she would like to go ahead with bilateral mastectomies when it is time to do her definitive surgery. She has had genetic testing and her BRCA1 and BRCA2 testing were negative. She knows that she will probably get radiation therapy on the left, and that reconstructive surgery would need to be delayed.  She has been advised that they wish to leave the Port-A-Cath and for now.  Recent Nuclear medicine PET scan performed all in 02/13/2013 shows decrease in metabolic activity in the mediastinum and bilateral hilar areas. Soft  tissue mass in the upper outer left breast shows decrease in metabolic activity and it measures 4.3 cm compared to 6.8 cm on prior exam. There are no suspicious pulmonary nodules. Abdomen shows diffuse liver metastasis, previously biopsied showing granuloma consistent with sarcoid. Multiple hypermetabolic lesions in the spleen slightly decreased hypermetabolic external iliac and inguinal have resolved.  Previously, she had 2 small hypermetabolic lesions in the right breast, and biopsy was declined...she stated she wanted to have bilateral mastectomies. After meeting with Dr. Orlie Dakin, she now states that she would like to have a lumpectomy on the left and no surgery on the right. This led to a very, very lengthy discussion.  I have explained to her that she almost certainly has multi-quadrant disease in the left breast and axillary disease of the left most likely. I have told her that the standard of care for the left breast as a left modified radical mastectomy, postop radiation therapy, and  delayed reconstruction if she chooses that. I have told her that we are uncertain about her right breast, since it was never biopsied.  We decided to hold off on surgery and did bilateral breast MRI and return for further discussion. Also offered to refer her to another surgeon or to university setting for another opinion. At this point in time she is not interested in that. HPI  Past Medical History  Diagnosis Date  . Hyperlipidemia     diet therapy per patient request  . 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  Hematuria 2000/2001    evaluted in [place] with negative results  . Breast CA     left  . Diabetes     diet therapy     Past Surgical History  Procedure Laterality Date  . Tubal ligation  2004  . Rectocele repair  2004  . Cystoscopy  2001  . Excision of facial nevi  2004  . Breast biopsy  2014  . Liver biopsy    . Portacath placement  09/20/2012    Procedure: INSERTION  PORT-A-CATH;  Surgeon: Ernestene Mention, MD;  Location: [facility] OR;  Service: General;  Laterality: N/A;  port a cath insertion with flouro and ultrasound     Family History  Problem Relation Age of Onset  . Hypertension Mother   . Diabetes Mother     MI  . Heart disease Mother   . Hypertension Sister     x4 only one HTN and one thats DI  . Hypertension Brother     x4 only 1 htn & dm  . Heart disease Father   . Breast cancer Maternal Aunt     diagnosed in her 56s  . Cancer Maternal Aunt     unknown  . Breast cancer Paternal Aunt     diagnosed in her 26s  . Cervical cancer Sister 30  . Breast cancer Maternal Aunt     diagnosed in her 13s  . Breast cancer Cousin     3 maternal cousins - 2 in their 37s, 1 in her 45s    Social History History  Substance Use Topics  . Smoking status: Never Smoker   . Smokeless tobacco: Never Used  . Alcohol Use: No    Allergies  Allergen Reactions  . Metronidazole Hives and Itching  . Sulfonamide Derivatives Hives, Itching and Rash    Current Outpatient Prescriptions  Medication Sig Dispense Refill  . acetaminophen (TYLENOL) 500 MG tablet Take 1,000 mg by mouth every 6 (six) hours as needed. For pain      . Dextromethorphan HBr (TUSSIN COUGH PO) Take 5 mLs by mouth every 4 (four) hours as needed.      . Diphenhyd-Hydrocort-Nystatin (FIRST-DUKES MOUTHWASH) SUSP Use as directed 5 mLs in the mouth or throat 4 (four) times daily as needed (oral candidiasis).  300 mL  2  . lidocaine-prilocaine (EMLA) cream Apply 1 application topically as needed. Apply a quarter sized amount to port site 1 hour prior to chemo. Do not rub in. Cover with plastic.      Marland Kitchen LORazepam (ATIVAN) 1 MG tablet Take 1 mg by mouth every 4 (four) hours as needed. For nausea/vomiting. Do not drive while taking.      Marland Kitchen omeprazole (PRILOSEC) 20 MG capsule Take 1 capsule (20 mg total) by mouth daily.  30 capsule  2  . ondansetron (ZOFRAN ODT) 8 MG disintegrating tablet Take 1 tablet  (8 mg total) by mouth every 8 (eight) hours as needed for nausea.  20 tablet  2  . potassium chloride SA (K-DUR,KLOR-CON) 20 MEQ tablet Take 1 tablet (20 mEq total) by mouth 2 (two) times daily.  60 tablet  1  . prochlorperazine (COMPAZINE) 25 MG suppository Place 25 mg rectally every 6 (six) hours as needed. Nausea/vomiting       No current facility-administered medications for this visit.    Review of Systems Review of Systems  Constitutional: Negative for fever, chills and unexpected weight change.  HENT: Negative for hearing loss, congestion, sore throat, trouble  swallowing and voice change.   Eyes: Negative for visual disturbance.  Respiratory: Negative for cough and wheezing.   Cardiovascular: Negative for chest pain, palpitations and leg swelling.  Gastrointestinal: Negative for nausea, vomiting, abdominal pain, diarrhea, constipation, blood in stool, abdominal distention and anal bleeding.  Genitourinary: Negative for hematuria, vaginal bleeding and difficulty urinating.  Musculoskeletal: Negative for arthralgias.  Skin: Negative for rash and wound.  Neurological: Negative for seizures, syncope and headaches.  Hematological: Negative for adenopathy. Does not bruise/bleed easily.  Psychiatric/Behavioral: Negative for confusion.    Blood pressure 129/83, pulse 70, temperature 97.8 F (36.6 C), temperature source Temporal, resp. rate 12, height 5\' 4"  (1.626 m), weight 199 lb 12.8 oz (90.629 kg).  Physical Exam Physical Exam  Constitutional: She is oriented to person, place, and time. She appears well-developed and well-nourished. No distress.  HENT:  Head: Normocephalic and atraumatic.  Nose: Nose normal.  Mouth/Throat: No oropharyngeal exudate.  Eyes: Conjunctivae and EOM are normal. Pupils are equal, round, and reactive to light. Left eye exhibits no discharge. No scleral icterus.  Neck: Neck supple. No JVD present. No tracheal deviation present. No thyromegaly present.   Cardiovascular: Normal rate, regular rhythm, normal heart sounds and intact distal pulses.   No murmur heard. Pulmonary/Chest: Effort normal and breath sounds normal. No respiratory distress. She has no wheezes. She has no rales. She exhibits no tenderness.  Port-A-Cath right IJ vein well healed. Breasts are very large and symmetrical. Palpable mass left upper outer quadrant approximately 4-5 cm. Left axillary lymph nodes are palpable but seems smaller. I do not palpate a mass in the lower outer left breast. Right breast is large. I'm not sure I feel any mass or adenopathy on the right. Skin OK.  Musculoskeletal: She exhibits no edema and no tenderness.  Lymphadenopathy:    She has no cervical adenopathy.  Neurological: She is alert and oriented to person, place, and time. She exhibits normal muscle tone. Coordination normal.  Skin: Skin is warm. No rash noted. She is not diaphoretic. No erythema. No pallor.  Psychiatric: She has a normal mood and affect. Her behavior is normal. Judgment and thought content normal.    Data Reviewed All notesrom cancer center. All pathology reports. 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 Pretreatment clinical stage TIII, N1.  Some downstaging from neoadjuvant chemotherapy left breast  Abnormal MRI right breast, right breast cancer suspected but biopsy was declined previously. Status unknown  BRCA one and BRCA2 negative  Biopsy-proven sarcoidosis the liver      Plan    Very lengthy discussion centered around the extent of disease in the extent of surgery. Her desires and plans for surgery have changed.  We will hold off for surgery and get a bilateral breast MRI now . If the enhancing masses in the right breast persists, then I think she should undergo image guided biopsy. If they all resolved thenno biopsy would be  possible, unless visible by [place].  I have advised her that the standard of care for her left breast is left modified radical mastectomy. I do not think I can do a lumpectomy because of multiple quadrant disease.  She remains interested in reconstruction in the future but knows this will have to be delayed  Return to see me after the breast MRIs done for further discussion.         Angelia Mould. Derrell Lolling,  M.D., [facility] General and Minimally invasive Surgery Breast and Colorectal Surgery Office:   [phone] Pager:   [phone]  02/16/2013, 11:29 AM

## 2013-02-16 NOTE — Patient Instructions (Signed)
Today we have reviewed all of your imaging studies, pathology reports, and the status of the cancer in your left breast.  You have stated that he would like to have as much conservative surgery as possible, and that you do not want to have bilateral mastectomies unless it is absolutely essential.  We will schedule you for bilateral breast MRI to see if we can determine the extent of your disease, as best as possible.  I have told you that the standard of care for treatment of your left breast is a left modified radical mastectomy, postop radiation therapy, with or without delayed reconstruction.  I have told you that we do not know whether you have breast cancer or sarcoidosis in the right breast, since we never biopsied the right breast.  Return to see Dr. Derrell Lolling after the breast MRI is performed and we will decide what your options are

## 2013-02-22 ENCOUNTER — Telehealth (INDEPENDENT_AMBULATORY_CARE_PROVIDER_SITE_OTHER): Payer: Self-pay

## 2013-02-22 NOTE — Telephone Encounter (Signed)
I called and gave the pt her follow up after MRI on 7/24 at 0900

## 2013-03-01 ENCOUNTER — Ambulatory Visit
Admission: RE | Admit: 2013-03-01 | Discharge: 2013-03-01 | Disposition: A | Discharge status: Home / Self Care | Payer: Medicaid Other | Source: Ambulatory Visit | Attending: General Surgery | Admitting: General Surgery

## 2013-03-01 DIAGNOSIS — C50912 Malignant neoplasm of unspecified site of left female breast: Secondary | ICD-10-CM

## 2013-03-01 MED ORDER — GADOBENATE DIMEGLUMINE 529 MG/ML IV SOLN
19.0000 mL | Freq: Once | INTRAVENOUS | Status: AC | PRN
  Administered 2013-03-01: 19 mL via INTRAVENOUS

## 2013-03-02 ENCOUNTER — Other Ambulatory Visit (INDEPENDENT_AMBULATORY_CARE_PROVIDER_SITE_OTHER): Payer: Self-pay | Admitting: General Surgery

## 2013-03-02 DIAGNOSIS — C50912 Malignant neoplasm of unspecified site of left female breast: Secondary | ICD-10-CM

## 2013-03-03 ENCOUNTER — Telehealth (INDEPENDENT_AMBULATORY_CARE_PROVIDER_SITE_OTHER): Payer: Self-pay

## 2013-03-03 NOTE — Telephone Encounter (Signed)
Message copied by Ivory Broad on Fri Mar 03, 2013 10:47 AM ------      Message from: Ernestene Mention      Created: Fri Mar 03, 2013  9:20 AM       Call in phenergan 25 mg. Tablets #2. Take one hour before MRI. Can take four hours later if needed.            hmi                  ----- Message -----         From: Ivory Broad, RN         Sent: 03/03/2013   8:40 AM           To: Ernestene Mention, MD              Dr Derrell Lolling see message below.  Can you give me an order for something to call in for her?                  Huntley Dec      ----- Message -----         From: Osborne Oman         Sent: 03/03/2013   7:56 AM           To: Ivory Broad, RN            Huntley Dec,      This pt vomited during her MRI and has been rescheduled for Tues 7/22 at 7:15am.  Please ask Dr. Derrell Lolling call in RX for Phenergan or Zofran to take prior to next MRI.  Call to [facility] [phone].  Dr Azucena Kuba said that these are not normally prescribed by radiologists.  Call me if you have any questions. I'm on vacation next week.      Thanks.      Olegario Messier             ------

## 2013-03-03 NOTE — Telephone Encounter (Signed)
I called [employer] and phoned in Phenergen 25 mg po #2, take one tablet one hr before MRI and may take 4 hours later if needed.  I left a message for the pt to call so I can let her know.

## 2013-03-07 ENCOUNTER — Ambulatory Visit
Admission: RE | Admit: 2013-03-07 | Discharge: 2013-03-07 | Disposition: A | Discharge status: Home / Self Care | Payer: Medicaid Other | Source: Ambulatory Visit | Attending: General Surgery | Admitting: General Surgery

## 2013-03-07 DIAGNOSIS — C50912 Malignant neoplasm of unspecified site of left female breast: Secondary | ICD-10-CM

## 2013-03-07 MED ORDER — GADOBENATE DIMEGLUMINE 529 MG/ML IV SOLN
18.0000 mL | Freq: Once | INTRAVENOUS | Status: AC | PRN
  Administered 2013-03-07: 18 mL via INTRAVENOUS

## 2013-03-09 ENCOUNTER — Telehealth (INDEPENDENT_AMBULATORY_CARE_PROVIDER_SITE_OTHER): Payer: Self-pay | Admitting: General Surgery

## 2013-03-09 ENCOUNTER — Encounter (INDEPENDENT_AMBULATORY_CARE_PROVIDER_SITE_OTHER): Payer: Self-pay | Admitting: General Surgery

## 2013-03-09 ENCOUNTER — Ambulatory Visit (INDEPENDENT_AMBULATORY_CARE_PROVIDER_SITE_OTHER): Payer: Medicaid Other | Admitting: General Surgery

## 2013-03-09 VITALS — BP 140/82 | HR 86 | Resp 16 | Ht 64.0 in | Wt 198.4 lb

## 2013-03-09 DIAGNOSIS — C50919 Malignant neoplasm of unspecified site of unspecified female breast: Secondary | ICD-10-CM

## 2013-03-09 DIAGNOSIS — C50912 Malignant neoplasm of unspecified site of left female breast: Secondary | ICD-10-CM

## 2013-03-09 NOTE — Progress Notes (Addendum)
Patient ID: Brenda Avery, female   DOB: 07-29-1971, 42 y.o.   MRN: [id number]  No chief complaint on file.   HPI Brenda Avery is a 42 y.o. female.  She returns for further discussion regarding definitive breast surgery for her breast cancer.  End of treatment MRI was performed on 03/07/2013. This shows that the left breast again shows a large mass, although there has been a response. The tumor was originally 7.2 cm and is now 3.5 cm. There are numerous surrounding satellite nodules. This enhancement extending to the level of the chest wall. The lymph nodes looked normal. In the right breast there was a microlobulated mass at 10:00 measuring 1.8 cm date is a second mass on the right anteriorly and slightly medially to this mass measuring 9 mm. A simple cyst at the 9:00 position.  . Dr. Mariel Sleet is no longer working at [facility] and she has seen an interim oncologist named Dr. Christian Mate  Her initial MRI suggested more extensive disease in the left and 2 masses in the right breast, which were not suspected initially.  Once the liver biopsy showed sarcoidosis, it became apparent that it is possible she may not have stage IV disease. We went ahead with bilateral breast MRI at that time, knowing that this was not baseline since she had received her first cycle of chemo. This showed marked bilateral parenchymal enhancement. 2 biopsy marker clips were seen on the left but there was extensive breast cancer involving multiple quadrants on the left, left upper outer quadrant and left lower outer quadrant and also suspicious for chest wall involvement, possibly or possible axillary node involvement and sternal metastasis. Hopefully this is sarcoid. The right breast showed 2 small masses, 18 mm and 9 mm at the 10:00 position. To biopsy the right side an ultrasound would need to be performed, and possibly MRI biopsy would need to be performed.   In April, she declined to have any further biopsies on the right,  stating that she would like to go ahead with bilateral mastectomies when it is time to do her definitive surgery. She has had genetic testing and her BRCA1 and BRCA2 testing were negative. She knows that she will probably get radiation therapy on the left, and that reconstructive surgery would need to be delayed.  She has been advised that they wish to leave the Port-A-Cath and for now.    Nuclear medicine PET scan performed all in 02/13/2013 shows decrease in metabolic activity in the mediastinum and bilateral hilar areas. Soft tissue mass in the upper outer left breast shows decrease in metabolic activity and it measures 4.3 cm compared to 6.8 cm on prior exam. There are no suspicious pulmonary nodules. Abdomen shows diffuse liver spots, previously biopsied showing granuloma consistent with sarcoid. Multiple hypermetabolic lesions in the spleen slightly decreased hypermetabolic external iliac and inguinal have resolved.  Previously, she had 2 small hypermetabolic lesions in the right breast, and biopsy was declined...she stated she wanted to have bilateral mastectomies. After meeting with Dr. Orlie Dakin, she thought that she would like to have a lumpectomy on the left and no surgery on the right. This led to a lengthy discussion.   I have explained to her that she almost certainly has multi-quadrant disease in the left breast and axillary disease of the left most likely. I have told her that the standard of care for the left breast as a left modified radical mastectomy, postop radiation therapy, and delayed reconstruction if she  chooses that. I have told her that we are uncertain about her right breast, since it was never biopsied.   After discussing her recent MRI, she says that it is her desire to have bilateral mastectomy and delayed reconstruction. She knows that we will do a complete axillary lymph node dissection left. I advised her to have a sentinel lymph node biopsy on the right since there may be  malignancy on the right.  HPI  Past Medical History  Diagnosis Date  . Hyperlipidemia     diet therapy per patient request  . 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 Hematuria 2000/2001    evaluted in [place] with negative results  . Breast CA     left  . Diabetes     diet therapy     Past Surgical History  Procedure Laterality Date  . Tubal ligation  2004  . Rectocele repair  2004  . Cystoscopy  2001  . Excision of facial nevi  2004  . Breast biopsy  2014  . Liver biopsy    . Portacath placement  09/20/2012    Procedure: INSERTION PORT-A-CATH;  Surgeon: Ernestene Mention, MD;  Location: [facility] OR;  Service: General;  Laterality: N/A;  port a cath insertion with flouro and ultrasound     Family History  Problem Relation Age of Onset  . Hypertension Mother   . Diabetes Mother     MI  . Heart disease Mother   . Hypertension Sister     x4 only one HTN and one thats DI  . Hypertension Brother     x4 only 1 htn & dm  . Heart disease Father   . Breast cancer Maternal Aunt     diagnosed in her 19s  . Cancer Maternal Aunt     unknown  . Breast cancer Paternal Aunt     diagnosed in her 80s  . Cervical cancer Sister 73  . Breast cancer Maternal Aunt     diagnosed in her 29s  . Breast cancer Cousin     3 maternal cousins - 2 in their 57s, 1 in her 85s    Social History History  Substance Use Topics  . Smoking status: Never Smoker   . Smokeless tobacco: Never Used  . Alcohol Use: No    Allergies  Allergen Reactions  . Metronidazole Hives and Itching  . Sulfonamide Derivatives Hives, Itching and Rash    Current Outpatient Prescriptions  Medication Sig Dispense Refill  . acetaminophen (TYLENOL) 500 MG tablet Take 1,000 mg by mouth every 6 (six) hours as needed. For pain      . Dextromethorphan HBr (TUSSIN COUGH PO) Take 5 mLs by mouth every 4 (four) hours as needed.      . Diphenhyd-Hydrocort-Nystatin (FIRST-DUKES MOUTHWASH) SUSP Use as directed 5 mLs in  the mouth or throat 4 (four) times daily as needed (oral candidiasis).  300 mL  2  . lidocaine-prilocaine (EMLA) cream Apply 1 application topically as needed. Apply a quarter sized amount to port site 1 hour prior to chemo. Do not rub in. Cover with plastic.      Marland Kitchen LORazepam (ATIVAN) 1 MG tablet Take 1 mg by mouth every 4 (four) hours as needed. For nausea/vomiting. Do not drive while taking.      Marland Kitchen omeprazole (PRILOSEC) 20 MG capsule Take 1 capsule (20 mg total) by mouth daily.  30 capsule  2  . ondansetron (ZOFRAN ODT) 8  MG disintegrating tablet Take 1 tablet (8 mg total) by mouth every 8 (eight) hours as needed for nausea.  20 tablet  2  . potassium chloride SA (K-DUR,KLOR-CON) 20 MEQ tablet Take 1 tablet (20 mEq total) by mouth 2 (two) times daily.  60 tablet  1  . prochlorperazine (COMPAZINE) 25 MG suppository Place 25 mg rectally every 6 (six) hours as needed. Nausea/vomiting       No current facility-administered medications for this visit.    Review of Systems Review of Systems  Constitutional: Negative for fever, chills and unexpected weight change.  HENT: Negative for hearing loss, congestion, sore throat, trouble swallowing and voice change.   Eyes: Negative for visual disturbance.  Respiratory: Negative for cough and wheezing.   Cardiovascular: Negative for chest pain, palpitations and leg swelling.  Gastrointestinal: Negative for nausea, vomiting, abdominal pain, diarrhea, constipation, blood in stool, abdominal distention and anal bleeding.  Genitourinary: Negative for hematuria, vaginal bleeding and difficulty urinating.  Musculoskeletal: Negative for arthralgias.  Skin: Negative for rash and wound.  Neurological: Positive for numbness. Negative for seizures, syncope and headaches.  Hematological: Negative for adenopathy. Does not bruise/bleed easily.  Psychiatric/Behavioral: Negative for confusion.    Blood pressure 140/82, pulse 86, resp. rate 16, height 5\' 4"  (1.626 m),  weight 198 lb 6.4 oz (89.994 kg).  Physical Exam Physical Exam  Constitutional: She is oriented to person, place, and time. She appears well-developed and well-nourished. No distress.  HENT:  Head: Normocephalic and atraumatic.  Nose: Nose normal.  Mouth/Throat: No oropharyngeal exudate.  Eyes: Conjunctivae and EOM are normal. Pupils are equal, round, and reactive to light. Left eye exhibits no discharge. No scleral icterus.  Neck: Neck supple. No JVD present. No tracheal deviation present. No thyromegaly present.  Cardiovascular: Normal rate, regular rhythm, normal heart sounds and intact distal pulses.   No murmur heard. Pulmonary/Chest: Effort normal and breath sounds normal. No respiratory distress. She has no wheezes. She has no rales. She exhibits no tenderness.  Breasts are very large. They are symmetrical. There is a palpable mass in the left upper-outer quadrant, approximately 4 cm. This is mobile. There appeared to be a palpable left axillary lymph node. There is no palpable mass in the lower outer left breast. No palpable mass in the right breast. Port-A-Cath right IJ vein well healed.  Abdominal: Soft. Bowel sounds are normal. She exhibits no distension and no mass. There is no tenderness. There is no rebound and no guarding.  Musculoskeletal: She exhibits no edema and no tenderness.  Lymphadenopathy:    She has no cervical adenopathy.  Neurological: She is alert and oriented to person, place, and time. She exhibits normal muscle tone. Coordination normal.  Skin: Skin is warm. No rash noted. She is not diaphoretic. No erythema. No pallor.  Psychiatric: She has a normal mood and affect. Her behavior is normal. Judgment and thought content normal.    Data Reviewed MRI  Assessment    Advanced left-sided breast cancer, infiltrating ductal type, clinical stage III versus stage IV, receptor positive, HER-2/neu negative. Pretreatment clinical stage TIII, N1.   Some downstaging  from neoadjuvant chemotherapy left breast   Abnormal MRI right breast, right breast cancer suspected but biopsy was declined previously. Status unknown   BRCA one and BRCA2 negative   Biopsy-proven sarcoidosis the liver     Plan    This patient will be scheduled for left modified radical mastectomy, right total mastectomy and right axillary sentinel lymph node biopsy  I discussed the indications, details, techniques numerous risk of the surgery with the patient and her husband. We talked about bleeding, infection, skin necrosis, cosmetic deformity, arm swelling, or numbness, frozen shoulder another unforseen problems. She understands all these issues well and all of her questions are answered. She is in full agreement with this plan.  She knows that she will need radiation therapy the left chest wall postop  Addendum: she has followup appt. With Dr. Welton Flakes 04/24/2013.  She remains interested in  delayed reconstruction in the future. She is not interested in talking with a Engineer, petroleum at this time       Angelia Mould. Derrell Lolling, M.D., [facility] General and Minimally invasive Surgery Breast and Colorectal Surgery Office:   [phone] Pager:   [phone]  03/09/2013, 10:01 AM

## 2013-03-09 NOTE — Telephone Encounter (Signed)
We will need pre authorization for right breast surgery, Albin Felling will try and get the authorization and then we will schedule.  We have advise her that we could schedule just the left breast now but patient wants to wait to do both.

## 2013-03-09 NOTE — Patient Instructions (Signed)
We have discussed your response to chemotherapy, and the findings of the recent MRI.  You have elected to undergo bilateral mastectomy. On the left side we will need to do a complete lymph node dissection. On the right side we will do a limited sentinel lymph node biopsy.    Mastectomy, With or Without Reconstruction Mastectomy (removal of the breast) is a procedure most commonly used to treat cancer (tumor) of the breast. Different procedures are available for treatment. This depends on the stage of the tumor (abnormal growths). Discuss this with your caregiver, surgeon (a specialist for performing operations such as this), or oncologist (someone specialized in the treatment of cancer). With proper information, you can decide which treatment is best for you. Although the sound of the word cancer is frightening to all of [place], the new treatments and medications can be a source of reassurance and comfort. If there are things you are worried about, discuss them with your caregiver. He or she can help comfort you and your family. Some of the different procedures for treating breast cancer are:  Radical (extensive) mastectomy. This is an operation used to remove the entire breast, the muscles under the breast, and all of the glands (lymph nodes) under the arm. With all of the new treatments available for cancer of the breast, this procedure has become less common.  Modified radical mastectomy. This is a similar operation to the radical mastectomy described above. In the modified radical mastectomy, the muscles of the chest wall are not removed unless one of the lessor muscles is removed. One of the lessor muscles may be removed to allow better removal of the lymph nodes. The axillary lymph nodes are also removed. Rarely, during an axillary node dissection nerves to this area are damaged. Radiation therapy is then often used to the area following this surgery.  A total mastectomy also known as a complete or  simple mastectomy. It involves removal of only the breast. The lymph nodes and the muscles are left in place.  In a lumpectomy, the lump is removed from the breast. This is the simplest form of surgical treatment. A sentinel lymph node biopsy may also be done. Additional treatment may be required. RISKS AND COMPLICATIONS The main problems that follow removal of the breast include:  Infection (germs start growing in the wound). This can usually be treated with antibiotics (medications that kill germs).  Lymphedema. This means the arm on the side of the breast that was operated on swells because the lymph (tissue fluid) cannot follow the main channels back into the body. This only occurs when the lymph nodes have had to be removed under the arm.  There may be some areas of numbness to the upper arm and around the incision (cut by the surgeon) in the breast. This happens because of the cutting of or damage to some of the nerves in the area. This is most often unavoidable.  There may be difficulty moving the arm in a full range of motion (moving in all directions) following surgery. This usually improves with time following use and exercise.  Recurrence of breast cancer may happen with the very best of surgery and follow up treatment. Sometimes small cancer cells that cannot be seen with the naked eye have already spread at the time of surgery. When this happens other treatment is available. This treatment may be radiation, medications or a combination of both. RECONSTRUCTION Reconstruction of the breast may be done immediately if there is not going  to be post-operative radiation. This surgery is done for cosmetic (improve appearance) purposes to improve the physical appearance after the operation. This may be done in two ways:  It can be done using a saline filled prosthetic (an artificial breast which is filled with salt water). Silicone breast implants are now re-approved by the FDA and are being  commonly used.  Reconstruction can be done using the body's own muscle/fat/skin. Your caregiver will discuss your options with you. Depending upon your needs or choice, together you will be able to determine which procedure is best for you. Document Released: 04/28/2001 Document Revised: 04/27/2012 Document Reviewed: 12/20/2007 [facility] Patient Information 2014 [place], [place].

## 2013-03-14 ENCOUNTER — Ambulatory Visit (HOSPITAL_COMMUNITY): Payer: Medicaid Other

## 2013-03-15 ENCOUNTER — Encounter (HOSPITAL_COMMUNITY): Payer: Self-pay

## 2013-03-15 ENCOUNTER — Encounter (HOSPITAL_COMMUNITY): Payer: Medicaid Other | Attending: Oncology

## 2013-03-15 VITALS — BP 124/80 | HR 101 | Temp 98.5°F | Resp 16 | Wt 197.3 lb

## 2013-03-15 DIAGNOSIS — C800 Disseminated malignant neoplasm, unspecified: Secondary | ICD-10-CM | POA: Insufficient documentation

## 2013-03-15 DIAGNOSIS — C50919 Malignant neoplasm of unspecified site of unspecified female breast: Secondary | ICD-10-CM

## 2013-03-15 DIAGNOSIS — C50912 Malignant neoplasm of unspecified site of left female breast: Secondary | ICD-10-CM

## 2013-03-15 NOTE — Progress Notes (Signed)
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   INTERVAL HISTORY:   She was diagnosed with breast breast cancer following a core biopsy done on 08/19/2012.ER +100%, PR 30%, HER-2/neu nonamplified, Ki-67 marker 34%. She is also known to have right breast abnormality. Which I do not think has been biopsied yet. She had diffuse FDG uptake on the PET CT done in January of 2014.  This prompted a biopsy of one of a lesions in the liver which showed granulomatous inflammation nonnecrotizing. Most recent repeat PET/CT and 02/13/2013  Shows interval metabolic response. Due to this confusing picture patient is being treated with curative  intent with the assumption that the lesions on PET CT was related to the granulomatous inflammation. She was treated with dose dense AC chemotherapy followed by 12 weekly Taxol , all Chemotherapy was completed on 02/06/2013.  She returns to the clinic today  for scheduled follow up.  She tells me that she is scheduled for surgery in 2 weeks.  She also states that she has decided to undergo bilateral mastectomy with reconstruction.  She reportedly has not yet seen the radiation oncologist.  Recent breast MRI images were personally reviewed by me and report is noted as below.  She reports grade 1 numbness in the fingers and toes which does not interfere with activity.  She still has alopecia otherwise there is no residual toxicity from chemotherapy which she tells me she tolerated fairly well.  She tells me her last menstrual period was after she finished the [facility] Phase of treatment which appears to be around March of 2014.  MEDICAL HISTORY: Past Medical History  Diagnosis Date  . Hyperlipidemia     diet therapy per patient  request  . 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 Hematuria 2000/2001    evaluted in [place] with negative results  . Breast CA     left  . Diabetes     diet therapy     ALLERGIES:  is allergic to metronidazole and sulfonamide derivatives.  MEDICATIONS:  Current Outpatient Prescriptions  Medication Sig Dispense Refill  . acetaminophen (TYLENOL) 500 MG tablet Take 1,000 mg by mouth every 6 (six) hours as needed. For pain      . lidocaine-prilocaine (EMLA) cream Apply 1 application topically as needed. Apply a quarter sized amount to port site 1 hour prior to chemo. Do not rub in. Cover with plastic.      Marland Kitchen omeprazole (PRILOSEC) 20 MG capsule Take 1 capsule (20 mg total) by mouth daily.  30 capsule  2  . potassium chloride SA (K-DUR,KLOR-CON) 20 MEQ tablet Take 1 tablet (20 mEq total) by mouth 2 (two) times daily.  60 tablet  1  . Dextromethorphan HBr (TUSSIN COUGH PO) Take 5 mLs by mouth every 4 (four) hours as needed.      . Diphenhyd-Hydrocort-Nystatin (FIRST-DUKES MOUTHWASH) SUSP Use as directed 5 mLs in the mouth or throat 4 (four) times daily as needed (oral candidiasis).  300 mL  2  . LORazepam (ATIVAN) 1 MG tablet Take 1 mg by mouth every 4 (four) hours as needed. For nausea/vomiting. Do not drive while taking.      . ondansetron (ZOFRAN ODT) 8 MG disintegrating tablet  Take 1 tablet (8 mg total) by mouth every 8 (eight) hours as needed for nausea.  20 tablet  2  . prochlorperazine (COMPAZINE) 25 MG suppository Place 25 mg rectally every 6 (six) hours as needed. Nausea/vomiting       No current facility-administered medications for this visit.    SURGICAL HISTORY:  Past Surgical History  Procedure Laterality Date  . Tubal ligation  2004  . Rectocele repair  2004  . Cystoscopy  2001  . Excision of facial nevi  2004  . Breast biopsy  2014  . Liver biopsy    . Portacath placement  09/20/2012    Procedure: INSERTION PORT-A-CATH;  Surgeon: Ernestene Mention, MD;   Location: [facility] OR;  Service: General;  Laterality: N/A;  port a cath insertion with flouro and ultrasound      REVIEW OF SYSTEMS: 14 point review of system is as in the history above otherwise negative.  PHYSICAL EXAMINATION:  Blood pressure 124/80, pulse 101, temperature 98.5 F (36.9 C), temperature source Oral, resp. rate 16, weight 197 lb 4.8 oz (89.495 kg). GENERAL: No acute distress. Alopecia noted on the scalp. SKIN:  No rashes or significant lesions . No ecchymosis or petechial rash. HEAD: Normocephalic, No masses, lesions, tenderness or abnormalities  EYES: Conjunctiva are pink and non-injected and no jaundice ENT: External ears normal ,lips, buccal mucosa, and tongue normal and mucous membranes are moist . No evidence of thrush. LYMPH: No palpable lymphadenopathy, in the neck, supraclavicular areas or axilla. BREAST:right breast; showed about a golf ball size lump in the outer upper quadrant which was  Mobile. The left breast showed a much bigger lump which was also in the upper outer quadrant which was also mobile. LUNGS: Clear to auscultation , no crackles or wheezes HEART: regular rate & rhythm, no murmurs, no gallops, S1 normal and S2 normal and no S3. ABDOMEN: Abdomen soft, non-tender, no masses or organomegaly and no hepatosplenomegaly palpable EXTREMITIES: No edema, no skin discoloration or tenderness NEURO: Alert & oriented , no focal motor  deficits.     LABORATORY DATA: Lab Results  Component Value Date   WBC 2.1* 02/06/2013   HGB 10.5* 02/06/2013   HCT 31.1* 02/06/2013   MCV 84.7 02/06/2013   PLT 236 02/06/2013      Chemistry      Component Value Date/Time   NA 139 02/06/2013 1024   K 3.4* 02/06/2013 1024   CL 103 02/06/2013 1024   CO2 26 02/06/2013 1024   BUN 13 02/06/2013 1024   CREATININE 0.75 02/06/2013 1024      Component Value Date/Time   CALCIUM 9.3 02/06/2013 1024   ALKPHOS 90 02/06/2013 1024   AST 24 02/06/2013 1024   ALT 31 02/06/2013 1024   BILITOT 0.3  02/06/2013 1024       RADIOGRAPHIC STUDIES: 03/07/2013  BILATERAL BREAST MRI WITH AND WITHOUT CONTRAST     IMPRESSION:  1. Interval decrease in bulk of large extensive left breast carcinoma.                           2. There are two unchanged enhancing masses within the right breast.    ASSESSMENT:  Patient is status post neoadjuvant dd AC /weekly Taxol with partial response of the left breast mass. She does have a right breast mass lesion and etiology is unclear at this time. Given that liver biopsy suggested granulomatous inflammation, treating patient as local regional disease  with curative intent makes logical sense and even if she turns out to metastatic in the future there would have not been any grounds lost. BRCA 1&2 negative reportedly. Mastectomy reportedly planned in 2 weeks.  Patient had made a choice of bilateral mastectomy. We talked about the fact that between lumpectomy and mastectomy in the appropriate patients there is no significant survival advantage.  She verbalizes that her mastectomy choice has been a personal one. We talked about adjuvant endocrine therapy and the role of tamoxifen ,in her case I think he would be a candidate for 10 years of tamoxifen in absence of aromatase inhibitor therapy. Alternatively she can be treated with tamoxifen until  when she becomes menopausal, at which time she can be switched to additional  2-3 years of aromatase inhibitor therapy.We talked about the side effects including but not limited to DVT, small risk of uterine cancer, aches and pains and hot flashes.   PLAN:  1.I think that patient will be a candidate for postoperative radiation. She has declined a referral at this time and wants to talk to her husband first for at. 2. She'll return to clinic in 4 weeks anticipating that this will be after breast surgery. 3. I told her she could start tamoxifen at this time but she wants to begin after surgery.     All questions were  satisfactorily answered. Patient knows to call if  any concern arises.  I spent more than 50 % counseling the patient face to face. The total time spent in the appointment was 30 minutes.   Sherral Hammers, MD FACP. Hematology/Oncology.

## 2013-03-15 NOTE — Patient Instructions (Addendum)
[facility] Discharge Instructions  RECOMMENDATIONS MADE BY THE CONSULTANT AND ANY TEST RESULTS WILL BE SENT TO YOUR REFERRING PHYSICIAN.  EXAM FINDINGS BY THE PHYSICIAN TODAY AND SIGNS OR SYMPTOMS TO REPORT TO CLINIC OR PRIMARY PHYSICIAN: Exam and discussion by Dr. Sharia Reeve.  Recommends consult with Radiation Oncology and to take anti-hormonal agent such as tamoxifen.  Let us know where you want you want to go for the consultation for radiation - [facility], [facility] or [facility] in [place].  MEDICATIONS PRESCRIBED:  none  INSTRUCTIONS GIVEN AND DISCUSSED: Report any new lumps, bone pain, shortness of breath or other symptoms.  SPECIAL INSTRUCTIONS/FOLLOW-UP: Follow-up in 1 month.  Thank you for choosing [facility] to provide your oncology and hematology care.  To afford each patient quality time with our providers, please arrive at least 15 minutes before your scheduled appointment time.  With your help, our goal is to use those 15 minutes to complete the necessary work-up to ensure our physicians have the information they need to help with your evaluation and healthcare recommendations.    Effective January 1st, 2014, we ask that you re-schedule your appointment with our physicians should you arrive 10 or more minutes late for your appointment.  We strive to give you quality time with our providers, and arriving late affects you and other patients whose appointments are after yours.    Again, thank you for choosing [facility].  Our hope is that these requests will decrease the amount of time that you wait before being seen by our physicians.       _____________________________________________________________  Should you have questions after your visit to [facility], please contact our office at [phone] between the hours of 8:30 a.m. and 5:00 p.m.  Voicemails left after 4:30 p.m. will  not be returned until the following business day.  For prescription refill requests, have your pharmacy contact our office with your prescription refill request.

## 2013-03-17 ENCOUNTER — Telehealth: Payer: Self-pay | Admitting: *Deleted

## 2013-03-17 NOTE — Telephone Encounter (Signed)
Called and spoke with patient she did see a covering provider at [facility] but still wants to see Dr. Welton Flakes.  Informed her as soon as I get a surgery date I will schedule her with Dr. Welton Flakes 2 weeks after surgery.  Contact information given and patient denies any questions or concerns at this time.

## 2013-03-20 ENCOUNTER — Telehealth: Payer: Self-pay | Admitting: *Deleted

## 2013-03-20 ENCOUNTER — Encounter (HOSPITAL_COMMUNITY): Payer: Medicaid Other | Attending: Oncology

## 2013-03-20 DIAGNOSIS — Z95828 Presence of other vascular implants and grafts: Secondary | ICD-10-CM

## 2013-03-20 DIAGNOSIS — C50419 Malignant neoplasm of upper-outer quadrant of unspecified female breast: Secondary | ICD-10-CM

## 2013-03-20 DIAGNOSIS — Z452 Encounter for adjustment and management of vascular access device: Secondary | ICD-10-CM

## 2013-03-20 DIAGNOSIS — Z9889 Other specified postprocedural states: Secondary | ICD-10-CM | POA: Insufficient documentation

## 2013-03-20 MED ORDER — HEPARIN SOD (PORK) LOCK FLUSH 100 UNIT/ML IV SOLN
500.0000 [IU] | Freq: Once | INTRAVENOUS | Status: AC
  Administered 2013-03-20: 500 [IU] via INTRAVENOUS
  Filled 2013-03-20: qty 5

## 2013-03-20 MED ORDER — HEPARIN SOD (PORK) LOCK FLUSH 100 UNIT/ML IV SOLN
INTRAVENOUS | Status: AC
  Filled 2013-03-20: qty 5

## 2013-03-20 MED ORDER — SODIUM CHLORIDE 0.9 % IJ SOLN
10.0000 mL | INTRAMUSCULAR | Status: DC | PRN
  Administered 2013-03-20: 10 mL via INTRAVENOUS
  Filled 2013-03-20: qty 10

## 2013-03-20 NOTE — Telephone Encounter (Signed)
Left message for pt to return my call so I can schedule a Med Onc appt w/ Dr. Welton Flakes week of 9/8.

## 2013-03-20 NOTE — Progress Notes (Signed)
Brenda Avery presented for Portacath access and flush. Proper placement of portacath confirmed by CXR. Portacath located right chest wall accessed with  H 20 needle. Good blood return present. Portacath flushed with 20ml NS and 500U/50ml Heparin and needle removed intact. Procedure without incident. Patient tolerated procedure well.

## 2013-03-20 NOTE — Telephone Encounter (Signed)
Left message for a return phone call to schedule an appt. With Dr. Welton Flakes 2 weeks after surgery. Her surgery is scheduled for 04/10/13. Awaiting patient response.

## 2013-03-21 ENCOUNTER — Telehealth: Payer: Self-pay | Admitting: *Deleted

## 2013-03-21 ENCOUNTER — Other Ambulatory Visit: Payer: Self-pay | Admitting: *Deleted

## 2013-03-21 DIAGNOSIS — C50412 Malignant neoplasm of upper-outer quadrant of left female breast: Secondary | ICD-10-CM

## 2013-03-21 NOTE — Telephone Encounter (Signed)
Called and spoke with patient and confirmed appointment for 04/24/13 at 1230pm.  Mailed before appointment letter and packet.  Emailed Dr. Derrell Lolling and Ivory Broad of appointment.  Emailed Otis Peak if radiation referral necessary.  Took paperwork to medical records for chart.

## 2013-03-29 ENCOUNTER — Encounter (HOSPITAL_COMMUNITY): Payer: Self-pay | Admitting: Pharmacy Technician

## 2013-04-03 NOTE — Pre-Procedure Instructions (Signed)
CHIMAMANDA SIEGFRIED  04/03/2013   Your procedure is scheduled on: Monday, August 25th    Report to [facility] at  7:30 AM.  Call this number if you have problems the morning of surgery: [phone]   Remember:   Do not eat food or drink liquids after midnight Sunday.   Take these medicines the morning of surgery with A SIP OF WATER: nothing   Do not wear jewelry, make-up or nail polish.  Do not wear lotions, powders, or perfumes. You may NOT wear deodorant.  Do not shave underarms & legs 48 hours prior to surgery.    Do not bring valuables to the hospital.  [facility] is not responsible for any belongings or valuables.  Contacts, dentures or bridgework may not be worn into surgery.   Leave suitcase in the car. After surgery it may be brought to your room.  For patients admitted to the hospital, checkout time is 11:00 AM the day of discharge.   Name and phone number of your driver:    Special Instructions: Shower using CHG 2 nights before surgery and the night before surgery.  If you shower the day of surgery use CHG.  Use special wash - you have one bottle of CHG for all showers.  You should use approximately 1/3 of the bottle for each shower.   Please read over the following fact sheets that you were given: Pain Booklet, Coughing and Deep Breathing and Surgical Site Infection Prevention

## 2013-04-04 ENCOUNTER — Encounter (HOSPITAL_COMMUNITY): Payer: Self-pay

## 2013-04-04 ENCOUNTER — Encounter (HOSPITAL_COMMUNITY)
Admission: RE | Admit: 2013-04-04 | Discharge: 2013-04-04 | Disposition: A | Discharge status: Home / Self Care | Payer: Medicaid Other | Source: Ambulatory Visit | Attending: General Surgery | Admitting: General Surgery

## 2013-04-04 ENCOUNTER — Telehealth (INDEPENDENT_AMBULATORY_CARE_PROVIDER_SITE_OTHER): Payer: Self-pay

## 2013-04-04 DIAGNOSIS — Z01812 Encounter for preprocedural laboratory examination: Secondary | ICD-10-CM | POA: Insufficient documentation

## 2013-04-04 LAB — COMPREHENSIVE METABOLIC PANEL
ALT: 31 U/L (ref 0–35)
BUN: 8 mg/dL (ref 6–23)
CO2: 23 mEq/L (ref 19–32)
Calcium: 9.8 mg/dL (ref 8.4–10.5)
Creatinine, Ser: 0.74 mg/dL (ref 0.50–1.10)
GFR calc Af Amer: >90 mL/min (ref >90)
GFR calc non Af Amer: >90 mL/min (ref >90)
Glucose, Bld: 112 mg/dL — ABNORMAL HIGH (ref 70–99)
Sodium: 139 mEq/L (ref 135–145)

## 2013-04-04 LAB — CBC WITH DIFFERENTIAL/PLATELET
Eosinophils Relative: 4 % (ref 0–5)
HCT: 35.9 % — ABNORMAL LOW (ref 36.0–46.0)
Hemoglobin: 12.4 g/dL (ref 12.0–15.0)
Lymphocytes Relative: 17 % (ref 12–46)
Lymphs Abs: 0.5 10*3/uL — ABNORMAL LOW (ref 0.7–4.0)
MCH: 27 pg (ref 26.0–34.0)
MCV: 78.2 fL (ref 78.0–100.0)
Monocytes Absolute: 0.5 10*3/uL (ref 0.1–1.0)
Monocytes Relative: 14 % — ABNORMAL HIGH (ref 3–12)
RBC: 4.59 MIL/uL (ref 3.87–5.11)
WBC: 3.2 10*3/uL — ABNORMAL LOW (ref 4.0–10.5)

## 2013-04-04 LAB — URINALYSIS, ROUTINE W REFLEX MICROSCOPIC
Bilirubin Urine: NEGATIVE
Nitrite: NEGATIVE
Specific Gravity, Urine: 1.016 (ref 1.005–1.030)
Urobilinogen, UA: 1 mg/dL (ref 0.0–1.0)

## 2013-04-04 LAB — URINE MICROSCOPIC-ADD ON

## 2013-04-04 NOTE — Telephone Encounter (Signed)
Patient called me back and I told her we just wanted to make sure and we call check the urine culture and see if there is any infection there.

## 2013-04-04 NOTE — Telephone Encounter (Signed)
I called the pt.  She states she is not on her period at this time.  She states the blood is always in her urine.  Our call got disconnected.  I tried to get her back on the phone and she didn't answer.  Will await culture results.

## 2013-04-04 NOTE — Telephone Encounter (Signed)
I will forward this request to [facility].

## 2013-04-04 NOTE — Telephone Encounter (Signed)
Debbie at [facility] pre admit called requesting Dr Derrell Lolling look at UA on pt. I advised her I will send msg to Dr Derrell Lolling.

## 2013-04-04 NOTE — Telephone Encounter (Signed)
There is blood in the urine but this does not look like a urinary tract infection. I wondered if she is currently menstruating. Check results of urine culture. Proceed with surgery.  hmi

## 2013-04-04 NOTE — Progress Notes (Signed)
Called [facility] and left message with triage nurse, who will give the message to Dr. Derrell Lolling to look over patient's UA results.   DA

## 2013-04-05 LAB — URINE CULTURE
Colony Count: NO GROWTH
Culture: NO GROWTH

## 2013-04-05 NOTE — Telephone Encounter (Signed)
I left a message for the pt to call so I can let her know the urine culture is negative.

## 2013-04-05 NOTE — Telephone Encounter (Signed)
Pt returned call and updated that the urine culture was negative.

## 2013-04-05 NOTE — H&P (Signed)
Brenda Avery   MRN:  [id number]   Description: 42 year old female  Provider: Ernestene Mention, MD  Department: [facility]        Diagnoses    Breast cancer, left    -  Primary    174.9         Current Vitals - Last Recorded    BP Pulse Resp Ht Wt BMI    140/82 86 16 5\' 4"  (1.626 m) 198 lb 6.4 oz (89.994 kg) 34.04 kg/m2       History and Physical   Ernestene Mention, MD    Status: Addendum                       .     HPI Brenda Avery is a 42 y.o. female.  She returns for further discussion regarding definitive breast surgery for her breast cancer.   End of treatment MRI was performed on 03/07/2013. This shows that the left breast again shows a large mass, although there has been a response. The tumor was originally 7.2 cm and is now 3.5 cm. There are numerous surrounding satellite nodules. This enhancement extending to the level of the chest wall. The lymph nodes looked normal. In the right breast there was a microlobulated mass at 10:00 measuring 1.8 cm date is a second mass on the right anteriorly and slightly medially to this mass measuring 9 mm. A simple cyst at the 9:00 position.    Dr. Mariel Sleet is no longer working at [facility] and she has seen an interim oncologist named Dr. Christian Mate   Her initial MRI suggested more extensive disease in the left and 2 masses in the right breast, which were not suspected initially.   Once the liver biopsy showed sarcoidosis, it became apparent that it is possible she may not have stage IV disease. We went ahead with bilateral breast MRI at that time, knowing that this was not baseline since she had received her first cycle of chemo. This showed marked bilateral parenchymal enhancement. 2 biopsy marker clips were seen on the left but there was extensive breast cancer involving multiple quadrants on the left, left upper outer quadrant and left lower outer quadrant and also suspicious for chest wall involvement, possibly  or possible axillary node involvement and sternal metastasis. Hopefully this is sarcoid. The right breast showed 2 small masses, 18 mm and 9 mm at the 10:00 position. To biopsy the right side an ultrasound would need to be performed, and possibly MRI biopsy would need to be performed.    In April, she declined to have any further biopsies on the right, stating that she would like to go ahead with bilateral mastectomies when it is time to do her definitive surgery. She has had genetic testing and her BRCA1 and BRCA2 testing were negative. She knows that she will probably get radiation therapy on the left, and that reconstructive surgery would need to be delayed.   She has been advised that they wish to leave the Port-A-Cath and for now.     Nuclear medicine PET scan performed all in 02/13/2013 shows decrease in metabolic activity in the mediastinum and bilateral hilar areas. Soft tissue mass in the upper outer left breast shows decrease in metabolic activity and it measures 4.3 cm compared to 6.8 cm on prior exam. There are no suspicious pulmonary nodules. Abdomen shows diffuse liver spots, previously biopsied showing granuloma consistent with sarcoid. Multiple  hypermetabolic lesions in the spleen slightly decreased hypermetabolic external iliac and inguinal have resolved.   Previously, she had 2 small hypermetabolic lesions in the right breast, and biopsy was declined...she stated she wanted to have bilateral mastectomies. After meeting with Dr. Orlie Dakin, she thought that she would like to have a lumpectomy on the left and no surgery on the right. This led to a lengthy discussion.    I have explained to her that she almost certainly has multi-quadrant disease in the left breast and axillary disease of the left most likely. I have told her that the standard of care for the left breast as a left modified radical mastectomy, postop radiation therapy, and delayed reconstruction if she chooses that. I have told  her that we are uncertain about her right breast, since it was never biopsied.    After discussing her recent MRI, she says that it is her desire to have bilateral mastectomy and delayed reconstruction. She knows that we will do a complete axillary lymph node dissection left. I advised her to have a sentinel lymph node biopsy on the right since there may be malignancy on the right.         Past Medical History   Diagnosis  Date   .  Hyperlipidemia         diet therapy per patient request   .  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  Hematuria  2000/2001       evaluted in [place] with negative results   .  Breast CA         left   .  Diabetes         diet therapy          Past Surgical History   Procedure  Laterality  Date   .  Tubal ligation    2004   .  Rectocele repair    2004   .  Cystoscopy    2001   .  Excision of facial nevi    2004   .  Breast biopsy    2014   .  Liver biopsy       .  Portacath placement    09/20/2012       Procedure: INSERTION PORT-A-CATH;  Surgeon: Ernestene Mention, MD;  Location: [facility] OR;  Service: General;  Laterality: N/A;  port a cath insertion with flouro and ultrasound          Family History   Problem  Relation  Age of Onset   .  Hypertension  Mother     .  Diabetes  Mother         MI   .  Heart disease  Mother     .  Hypertension  Sister         x4 only one HTN and one thats DI   .  Hypertension  Brother         x4 only 1 htn & dm   .  Heart disease  Father     .  Breast cancer  Maternal Aunt         diagnosed in her 74s   .  Cancer  Maternal Aunt         unknown   .  Breast cancer  Paternal Aunt         diagnosed in her 35s   .  Cervical cancer  Sister  22   .  Breast cancer  Maternal Aunt         diagnosed in her 17s   .  Breast cancer  Cousin         3 maternal cousins - 2 in their 56s, 1 in her 14s        Social History History   Substance Use Topics   .  Smoking status:  Never Smoker    .   Smokeless tobacco:  Never Used   .  Alcohol Use:  No         Allergies   Allergen  Reactions   .  Metronidazole  Hives and Itching   .  Sulfonamide Derivatives  Hives, Itching and Rash         Current Outpatient Prescriptions   Medication  Sig  Dispense  Refill   .  acetaminophen (TYLENOL) 500 MG tablet  Take 1,000 mg by mouth every 6 (six) hours as needed. For pain         .  Dextromethorphan HBr (TUSSIN COUGH PO)  Take 5 mLs by mouth every 4 (four) hours as needed.         .  Diphenhyd-Hydrocort-Nystatin (FIRST-DUKES MOUTHWASH) SUSP  Use as directed 5 mLs in the mouth or throat 4 (four) times daily as needed (oral candidiasis).   300 mL   2   .  lidocaine-prilocaine (EMLA) cream  Apply 1 application topically as needed. Apply a quarter sized amount to port site 1 hour prior to chemo. Do not rub in. Cover with plastic.         Marland Kitchen  LORazepam (ATIVAN) 1 MG tablet  Take 1 mg by mouth every 4 (four) hours as needed. For nausea/vomiting. Do not drive while taking.         Marland Kitchen  omeprazole (PRILOSEC) 20 MG capsule  Take 1 capsule (20 mg total) by mouth daily.   30 capsule   2   .  ondansetron (ZOFRAN ODT) 8 MG disintegrating tablet  Take 1 tablet (8 mg total) by mouth every 8 (eight) hours as needed for nausea.   20 tablet   2   .  potassium chloride SA (K-DUR,KLOR-CON) 20 MEQ tablet  Take 1 tablet (20 mEq total) by mouth 2 (two) times daily.   60 tablet   1   .  prochlorperazine (COMPAZINE) 25 MG suppository  Place 25 mg rectally every 6 (six) hours as needed. Nausea/vomiting             No current facility-administered medications for this visit.        Review of Systems   Constitutional: Negative for fever, chills and unexpected weight change.  HENT: Negative for hearing loss, congestion, sore throat, trouble swallowing and voice change.   Eyes: Negative for visual disturbance.  Respiratory: Negative for cough and wheezing.   Cardiovascular: Negative for chest pain, palpitations and  leg swelling.  Gastrointestinal: Negative for nausea, vomiting, abdominal pain, diarrhea, constipation, blood in stool, abdominal distention and anal bleeding.  Genitourinary: Negative for hematuria, vaginal bleeding and difficulty urinating.  Musculoskeletal: Negative for arthralgias.  Skin: Negative for rash and wound.  Neurological: Positive for numbness. Negative for seizures, syncope and headaches.  Hematological: Negative for adenopathy. Does not bruise/bleed easily.  Psychiatric/Behavioral: Negative for confusion.      Blood pressure 140/82, pulse 86, resp. rate 16, height 5\' 4"  (1.626 m), weight 198 lb 6.4 oz (89.994 kg).   Physical Exam  Constitutional: She is oriented to person, place, and time. She appears well-developed and well-nourished. No distress.  HENT:   Head: Normocephalic and atraumatic.   Nose: Nose normal.   Mouth/Throat: No oropharyngeal exudate.  Eyes: Conjunctivae and EOM are normal. Pupils are equal, round, and reactive to light. Left eye exhibits no discharge. No scleral icterus.  Neck: Neck supple. No JVD present. No tracheal deviation present. No thyromegaly present.  Cardiovascular: Normal rate, regular rhythm, normal heart sounds and intact distal pulses.    No murmur heard. Pulmonary/Chest: Effort normal and breath sounds normal. No respiratory distress. She has no wheezes. She has no rales. She exhibits no tenderness.  Breasts are very large. They are symmetrical. There is a palpable mass in the left upper-outer quadrant, approximately 4 cm. This is mobile. There appeared to be a palpable left axillary lymph node. There is no palpable mass in the lower outer left breast. No palpable mass in the right breast. Port-A-Cath right IJ vein well healed.  Abdominal: Soft. Bowel sounds are normal. She exhibits no distension and no mass. There is no tenderness. There is no rebound and no guarding.  Musculoskeletal: She exhibits no edema and no tenderness.   Lymphadenopathy:    She has no cervical adenopathy.  Neurological: She is alert and oriented to person, place, and time. She exhibits normal muscle tone. Coordination normal.  Skin: Skin is warm. No rash noted. She is not diaphoretic. No erythema. No pallor.  Psychiatric: She has a normal mood and affect. Her behavior is normal. Judgment and thought content normal.      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 Pretreatment clinical stage TIII, N1.    Some downstaging from neoadjuvant chemotherapy left breast    Abnormal MRI right breast, right breast cancer suspected but biopsy was declined previously. Status unknown    BRCA one and BRCA2 negative    Biopsy-proven sarcoidosis the liver       Plan    This patient will be scheduled for left modified radical mastectomy, right total mastectomy and right axillary sentinel lymph node biopsy   I discussed the indications, details, techniques numerous risk of the surgery with the patient and her husband. We talked about bleeding, infection, skin necrosis, cosmetic deformity, arm swelling, or numbness, frozen shoulder another unforseen problems. She understands all these issues well and all of her questions are answered. She is in full agreement with this plan.   She knows that she will need radiation therapy the left chest wall postop   Addendum: she has followup appt. With Dr. Welton Flakes 04/24/2013.   She remains interested in  delayed reconstruction in the future. She is not interested in talking with a Engineer, petroleum at this time          Angelia Mould. Derrell Lolling, M.D., [facility] General and Minimally invasive Surgery Breast and Colorectal Surgery Office:   [phone] Pager:   [phone]

## 2013-04-09 MED ORDER — DEXTROSE 5 % IV SOLN
3.0000 g | INTRAVENOUS | Status: AC
  Administered 2013-04-10: 3 g via INTRAVENOUS
  Filled 2013-04-09 (×2): qty 3000

## 2013-04-09 MED ORDER — CHLORHEXIDINE GLUCONATE 4 % EX LIQD: 1.0000 "application " | Freq: Once | CUTANEOUS | Status: DC

## 2013-04-10 ENCOUNTER — Encounter (HOSPITAL_COMMUNITY)
Admission: RE | Disposition: A | Discharge status: Home / Self Care | Payer: Self-pay | Source: Ambulatory Visit | Attending: General Surgery

## 2013-04-10 ENCOUNTER — Ambulatory Visit (HOSPITAL_COMMUNITY): Payer: Medicaid Other | Admitting: Anesthesiology

## 2013-04-10 ENCOUNTER — Observation Stay (HOSPITAL_COMMUNITY)
Admission: RE | Admit: 2013-04-10 | Discharge: 2013-04-12 | Disposition: A | Discharge status: Home / Self Care | Payer: Medicaid Other | Source: Ambulatory Visit | Attending: General Surgery | Admitting: General Surgery

## 2013-04-10 ENCOUNTER — Encounter (HOSPITAL_COMMUNITY)
Admission: RE | Admit: 2013-04-10 | Discharge: 2013-04-10 | Disposition: A | Discharge status: Home / Self Care | Payer: Medicaid Other | Source: Ambulatory Visit | Attending: General Surgery | Admitting: General Surgery

## 2013-04-10 ENCOUNTER — Encounter (HOSPITAL_COMMUNITY): Payer: Self-pay | Admitting: *Deleted

## 2013-04-10 ENCOUNTER — Encounter (HOSPITAL_COMMUNITY): Payer: Self-pay | Admitting: Anesthesiology

## 2013-04-10 DIAGNOSIS — E785 Hyperlipidemia, unspecified: Secondary | ICD-10-CM | POA: Insufficient documentation

## 2013-04-10 DIAGNOSIS — C50412 Malignant neoplasm of upper-outer quadrant of left female breast: Secondary | ICD-10-CM | POA: Diagnosis present

## 2013-04-10 DIAGNOSIS — R920 Mammographic microcalcification found on diagnostic imaging of breast: Secondary | ICD-10-CM

## 2013-04-10 DIAGNOSIS — D869 Sarcoidosis, unspecified: Secondary | ICD-10-CM | POA: Insufficient documentation

## 2013-04-10 DIAGNOSIS — D0590 Unspecified type of carcinoma in situ of unspecified breast: Secondary | ICD-10-CM

## 2013-04-10 DIAGNOSIS — E876 Hypokalemia: Secondary | ICD-10-CM | POA: Insufficient documentation

## 2013-04-10 DIAGNOSIS — I4949 Other premature depolarization: Secondary | ICD-10-CM | POA: Insufficient documentation

## 2013-04-10 DIAGNOSIS — C50912 Malignant neoplasm of unspecified site of left female breast: Secondary | ICD-10-CM

## 2013-04-10 DIAGNOSIS — E119 Type 2 diabetes mellitus without complications: Secondary | ICD-10-CM | POA: Insufficient documentation

## 2013-04-10 DIAGNOSIS — R112 Nausea with vomiting, unspecified: Secondary | ICD-10-CM | POA: Insufficient documentation

## 2013-04-10 DIAGNOSIS — N6019 Diffuse cystic mastopathy of unspecified breast: Secondary | ICD-10-CM

## 2013-04-10 DIAGNOSIS — N6039 Fibrosclerosis of unspecified breast: Secondary | ICD-10-CM | POA: Insufficient documentation

## 2013-04-10 DIAGNOSIS — C773 Secondary and unspecified malignant neoplasm of axilla and upper limb lymph nodes: Secondary | ICD-10-CM | POA: Insufficient documentation

## 2013-04-10 DIAGNOSIS — Z79899 Other long term (current) drug therapy: Secondary | ICD-10-CM | POA: Insufficient documentation

## 2013-04-10 DIAGNOSIS — Z95828 Presence of other vascular implants and grafts: Secondary | ICD-10-CM

## 2013-04-10 DIAGNOSIS — C50919 Malignant neoplasm of unspecified site of unspecified female breast: Secondary | ICD-10-CM | POA: Insufficient documentation

## 2013-04-10 HISTORY — PX: MASTECTOMY MODIFIED RADICAL: SHX5962

## 2013-04-10 HISTORY — DX: Type 2 diabetes mellitus without complications: E11.9

## 2013-04-10 HISTORY — PX: MASTECTOMY MODIFIED RADICAL: SUR848

## 2013-04-10 HISTORY — PX: MASTECTOMY: SHX3

## 2013-04-10 HISTORY — PX: AXILLARY SENTINEL NODE BIOPSY: SHX5738

## 2013-04-10 HISTORY — PX: SIMPLE MASTECTOMY WITH AXILLARY SENTINEL NODE BIOPSY: SHX6098

## 2013-04-10 SURGERY — SIMPLE MASTECTOMY WITH AXILLARY SENTINEL NODE BIOPSY
Anesthesia: General | Site: Breast | Laterality: Right | Wound class: Clean

## 2013-04-10 MED ORDER — MORPHINE SULFATE 2 MG/ML IJ SOLN
1.0000 mg | INTRAMUSCULAR | Status: DC | PRN
  Administered 2013-04-10 (×2): 2 mg via INTRAVENOUS
  Filled 2013-04-10 (×2): qty 1

## 2013-04-10 MED ORDER — ONDANSETRON HCL 4 MG/2ML IJ SOLN
INTRAMUSCULAR | Status: DC | PRN
  Administered 2013-04-10 (×2): 4 mg via INTRAVENOUS

## 2013-04-10 MED ORDER — HYDROMORPHONE HCL PF 1 MG/ML IJ SOLN
0.2500 mg | INTRAMUSCULAR | Status: DC | PRN
  Administered 2013-04-10: 0.5 mg via INTRAVENOUS

## 2013-04-10 MED ORDER — FENTANYL CITRATE 0.05 MG/ML IJ SOLN
INTRAMUSCULAR | Status: AC
  Administered 2013-04-10: 100 ug
  Filled 2013-04-10: qty 2

## 2013-04-10 MED ORDER — HYDROMORPHONE HCL PF 1 MG/ML IJ SOLN
INTRAMUSCULAR | Status: AC
  Filled 2013-04-10: qty 1

## 2013-04-10 MED ORDER — ROCURONIUM BROMIDE 100 MG/10ML IV SOLN
INTRAVENOUS | Status: DC | PRN
  Administered 2013-04-10: 30 mg via INTRAVENOUS

## 2013-04-10 MED ORDER — PROPOFOL INFUSION 10 MG/ML OPTIME
INTRAVENOUS | Status: DC | PRN
  Administered 2013-04-10: 50 ug/kg/min via INTRAVENOUS

## 2013-04-10 MED ORDER — PHENYLEPHRINE HCL 10 MG/ML IJ SOLN
10.0000 mg | INTRAVENOUS | Status: DC | PRN
  Administered 2013-04-10: 15 ug/min via INTRAVENOUS

## 2013-04-10 MED ORDER — ONDANSETRON HCL 4 MG PO TABS: 4.0000 mg | ORAL_TABLET | Freq: Four times a day (QID) | ORAL | Status: DC | PRN

## 2013-04-10 MED ORDER — PROPOFOL 10 MG/ML IV BOLUS
INTRAVENOUS | Status: DC | PRN
  Administered 2013-04-10: 50 mg via INTRAVENOUS
  Administered 2013-04-10: 160 mg via INTRAVENOUS

## 2013-04-10 MED ORDER — ONDANSETRON HCL 4 MG/2ML IJ SOLN
4.0000 mg | Freq: Four times a day (QID) | INTRAMUSCULAR | Status: AC | PRN
  Administered 2013-04-10: 4 mg via INTRAVENOUS

## 2013-04-10 MED ORDER — CEFAZOLIN SODIUM-DEXTROSE 2-3 GM-% IV SOLR
2.0000 g | Freq: Three times a day (TID) | INTRAVENOUS | Status: AC
  Administered 2013-04-10 – 2013-04-11 (×3): 2 g via INTRAVENOUS
  Filled 2013-04-10 (×3): qty 50

## 2013-04-10 MED ORDER — 0.9 % SODIUM CHLORIDE (POUR BTL) OPTIME
TOPICAL | Status: DC | PRN
  Administered 2013-04-10 (×4): 1000 mL

## 2013-04-10 MED ORDER — OXYCODONE HCL 5 MG/5ML PO SOLN: 5.0000 mg | Freq: Once | ORAL | Status: DC | PRN

## 2013-04-10 MED ORDER — ONDANSETRON HCL 4 MG/2ML IJ SOLN: 4.0000 mg | Freq: Four times a day (QID) | INTRAMUSCULAR | Status: DC | PRN

## 2013-04-10 MED ORDER — ENOXAPARIN SODIUM 40 MG/0.4ML ~~LOC~~ SOLN
40.0000 mg | SUBCUTANEOUS | Status: DC
  Administered 2013-04-11 – 2013-04-12 (×2): 40 mg via SUBCUTANEOUS
  Filled 2013-04-10 (×3): qty 0.4

## 2013-04-10 MED ORDER — MIDAZOLAM HCL 5 MG/5ML IJ SOLN
INTRAMUSCULAR | Status: DC | PRN
  Administered 2013-04-10: 2 mg via INTRAVENOUS

## 2013-04-10 MED ORDER — GLYCOPYRROLATE 0.2 MG/ML IJ SOLN
INTRAMUSCULAR | Status: DC | PRN
  Administered 2013-04-10: 0.2 mg via INTRAVENOUS

## 2013-04-10 MED ORDER — NEOSTIGMINE METHYLSULFATE 1 MG/ML IJ SOLN
INTRAMUSCULAR | Status: DC | PRN
  Administered 2013-04-10: 1 mg via INTRAVENOUS

## 2013-04-10 MED ORDER — SUCCINYLCHOLINE CHLORIDE 20 MG/ML IJ SOLN
INTRAMUSCULAR | Status: DC | PRN
  Administered 2013-04-10: 120 mg via INTRAVENOUS

## 2013-04-10 MED ORDER — POTASSIUM CHLORIDE CRYS ER 20 MEQ PO TBCR
20.0000 meq | EXTENDED_RELEASE_TABLET | Freq: Two times a day (BID) | ORAL | Status: DC
  Administered 2013-04-10 – 2013-04-11 (×3): 20 meq via ORAL
  Filled 2013-04-10 (×7): qty 1

## 2013-04-10 MED ORDER — PHENYLEPHRINE HCL 10 MG/ML IJ SOLN
INTRAMUSCULAR | Status: DC | PRN
  Administered 2013-04-10 (×2): 40 ug via INTRAVENOUS
  Administered 2013-04-10: 80 ug via INTRAVENOUS
  Administered 2013-04-10: 40 ug via INTRAVENOUS

## 2013-04-10 MED ORDER — FENTANYL CITRATE 0.05 MG/ML IJ SOLN
INTRAMUSCULAR | Status: DC | PRN
  Administered 2013-04-10: 100 ug via INTRAVENOUS
  Administered 2013-04-10: 50 ug via INTRAVENOUS
  Administered 2013-04-10 (×2): 100 ug via INTRAVENOUS
  Administered 2013-04-10: 50 ug via INTRAVENOUS

## 2013-04-10 MED ORDER — TECHNETIUM TC 99M SULFUR COLLOID FILTERED
1.0000 | Freq: Once | INTRAVENOUS | Status: AC | PRN
  Administered 2013-04-10: 1 via INTRADERMAL

## 2013-04-10 MED ORDER — METHYLENE BLUE 1 % INJ SOLN
INTRAMUSCULAR | Status: AC
  Filled 2013-04-10: qty 10

## 2013-04-10 MED ORDER — OXYCODONE-ACETAMINOPHEN 5-325 MG PO TABS
1.0000 | ORAL_TABLET | ORAL | Status: DC | PRN
  Administered 2013-04-11 – 2013-04-12 (×2): 1 via ORAL
  Filled 2013-04-10 (×2): qty 1

## 2013-04-10 MED ORDER — SODIUM CHLORIDE 0.9 % IJ SOLN
INTRAMUSCULAR | Status: DC | PRN
  Administered 2013-04-10: 10:00:00 via INTRAMUSCULAR

## 2013-04-10 MED ORDER — OXYCODONE HCL 5 MG PO TABS: 5.0000 mg | ORAL_TABLET | Freq: Once | ORAL | Status: DC | PRN

## 2013-04-10 MED ORDER — ONDANSETRON HCL 4 MG/2ML IJ SOLN
INTRAMUSCULAR | Status: AC
  Filled 2013-04-10: qty 2

## 2013-04-10 MED ORDER — DEXAMETHASONE SODIUM PHOSPHATE 4 MG/ML IJ SOLN
INTRAMUSCULAR | Status: DC | PRN
  Administered 2013-04-10: 4 mg via INTRAVENOUS

## 2013-04-10 MED ORDER — MIDAZOLAM HCL 2 MG/2ML IJ SOLN
INTRAMUSCULAR | Status: AC
  Administered 2013-04-10: 2 mg
  Filled 2013-04-10: qty 2

## 2013-04-10 MED ORDER — LACTATED RINGERS IV SOLN
INTRAVENOUS | Status: DC
  Administered 2013-04-10 (×2): via INTRAVENOUS

## 2013-04-10 MED ORDER — LIDOCAINE HCL (CARDIAC) 20 MG/ML IV SOLN
INTRAVENOUS | Status: DC | PRN
  Administered 2013-04-10: 100 mg via INTRAVENOUS

## 2013-04-10 SURGICAL SUPPLY — 64 items
ADH SKN CLS APL DERMABOND .7 (GAUZE/BANDAGES/DRESSINGS)
APL SKNCLS STERI-STRIP NONHPOA (GAUZE/BANDAGES/DRESSINGS)
APPLIER CLIP 9.375 MED OPEN (MISCELLANEOUS) ×6
APR CLP MED 9.3 20 MLT OPN (MISCELLANEOUS) ×4
BENZOIN TINCTURE PRP APPL 2/3 (GAUZE/BANDAGES/DRESSINGS) ×2 IMPLANT
BINDER BREAST LRG (GAUZE/BANDAGES/DRESSINGS) IMPLANT
BINDER BREAST XLRG (GAUZE/BANDAGES/DRESSINGS) ×2 IMPLANT
CANISTER SUCTION 2500CC (MISCELLANEOUS) ×3 IMPLANT
CHLORAPREP W/TINT 26ML (MISCELLANEOUS) ×4 IMPLANT
CLIP APPLIE 9.375 MED OPEN (MISCELLANEOUS) ×2 IMPLANT
CLOTH BEACON ORANGE TIMEOUT ST (SAFETY) ×3 IMPLANT
CONT SPEC 4OZ CLIKSEAL STRL BL (MISCELLANEOUS) ×4 IMPLANT
COVER PROBE W GEL 5X96 (DRAPES) ×3 IMPLANT
COVER SURGICAL LIGHT HANDLE (MISCELLANEOUS) ×3 IMPLANT
DERMABOND ADVANCED (GAUZE/BANDAGES/DRESSINGS)
DERMABOND ADVANCED .7 DNX12 (GAUZE/BANDAGES/DRESSINGS) ×2 IMPLANT
DRAIN CHANNEL 19F RND (DRAIN) ×6 IMPLANT
DRAPE CHEST BREAST 15X10 FENES (DRAPES) ×2 IMPLANT
DRAPE LAPAROSCOPIC ABDOMINAL (DRAPES) ×2 IMPLANT
DRAPE ORTHO SPLIT 77X108 STRL (DRAPES) ×6
DRAPE PROXIMA HALF (DRAPES) ×2 IMPLANT
DRAPE SURG 17X23 STRL (DRAPES) ×4 IMPLANT
DRAPE SURG ORHT 6 SPLT 77X108 (DRAPES) IMPLANT
DRAPE UTILITY 15X26 W/TAPE STR (DRAPE) ×6 IMPLANT
DRSG ADAPTIC 3X8 NADH LF (GAUZE/BANDAGES/DRESSINGS) ×8 IMPLANT
DRSG PAD ABDOMINAL 8X10 ST (GAUZE/BANDAGES/DRESSINGS) ×8 IMPLANT
ELECT BLADE 4.0 EZ CLEAN MEGAD (MISCELLANEOUS) ×3
ELECT CAUTERY BLADE 6.4 (BLADE) ×3 IMPLANT
ELECT REM PT RETURN 9FT ADLT (ELECTROSURGICAL) ×3
ELECTRODE BLDE 4.0 EZ CLN MEGD (MISCELLANEOUS) ×2 IMPLANT
ELECTRODE REM PT RTRN 9FT ADLT (ELECTROSURGICAL) ×2 IMPLANT
EVACUATOR SILICONE 100CC (DRAIN) ×8 IMPLANT
GLOVE BIO SURGEON STRL SZ7.5 (GLOVE) ×4 IMPLANT
GLOVE BIOGEL PI IND STRL 7.5 (GLOVE) IMPLANT
GLOVE BIOGEL PI INDICATOR 7.5 (GLOVE) ×2
GLOVE EUDERMIC 7 POWDERFREE (GLOVE) ×3 IMPLANT
GLOVE SURG SS PI 6.5 STRL IVOR (GLOVE) ×1 IMPLANT
GOWN STRL NON-REIN LRG LVL3 (GOWN DISPOSABLE) ×6 IMPLANT
GOWN STRL REIN XL XLG (GOWN DISPOSABLE) ×3 IMPLANT
KIT BASIN OR (CUSTOM PROCEDURE TRAY) ×3 IMPLANT
KIT ROOM TURNOVER OR (KITS) ×3 IMPLANT
NDL 18GX1X1/2 (RX/OR ONLY) (NEEDLE) ×2 IMPLANT
NDL HYPO 25GX1X1/2 BEV (NEEDLE) ×2 IMPLANT
NEEDLE 18GX1X1/2 (RX/OR ONLY) (NEEDLE) ×3 IMPLANT
NEEDLE HYPO 25GX1X1/2 BEV (NEEDLE) ×3 IMPLANT
NS IRRIG 1000ML POUR BTL (IV SOLUTION) ×3 IMPLANT
PACK GENERAL/GYN (CUSTOM PROCEDURE TRAY) ×3 IMPLANT
PAD ARMBOARD 7.5X6 YLW CONV (MISCELLANEOUS) ×3 IMPLANT
SPECIMEN JAR X LARGE (MISCELLANEOUS) ×4 IMPLANT
SPONGE GAUZE 4X4 12PLY (GAUZE/BANDAGES/DRESSINGS) ×6 IMPLANT
SPONGE LAP 18X18 X RAY DECT (DISPOSABLE) ×2 IMPLANT
STAPLER VISISTAT 35W (STAPLE) ×5 IMPLANT
STRIP CLOSURE SKIN 1/2X4 (GAUZE/BANDAGES/DRESSINGS) ×2 IMPLANT
SUT ETHILON 3 0 FSL (SUTURE) ×8 IMPLANT
SUT MNCRL AB 4-0 PS2 18 (SUTURE) ×4 IMPLANT
SUT SILK 2 0 FS (SUTURE) ×4 IMPLANT
SUT VIC AB 3-0 SH 18 (SUTURE) ×4 IMPLANT
SYR CONTROL 10ML LL (SYRINGE) ×3 IMPLANT
TAPE CLOTH SURG 6X10 WHT LF (GAUZE/BANDAGES/DRESSINGS) ×2 IMPLANT
TOWEL OR 17X24 6PK STRL BLUE (TOWEL DISPOSABLE) ×3 IMPLANT
TOWEL OR 17X26 10 PK STRL BLUE (TOWEL DISPOSABLE) ×3 IMPLANT
TOWEL OR NON WOVEN STRL DISP B (DISPOSABLE) ×1 IMPLANT
TRAY FOLEY CATH 16FR SILVER (SET/KITS/TRAYS/PACK) ×1 IMPLANT
WATER STERILE IRR 1000ML POUR (IV SOLUTION) IMPLANT

## 2013-04-10 NOTE — Anesthesia Preprocedure Evaluation (Addendum)
Anesthesia Evaluation  Patient identified by MRN, date of birth, ID band Patient awake    Reviewed: Allergy & Precautions, H&P , NPO status , Patient's Chart, lab work & pertinent test results  History of Anesthesia Complications Negative for: history of anesthetic complications  Airway Mallampati: II TM Distance: >3 FB Neck ROM: full    Dental  (+) Teeth Intact and Dental Advisory Given   Pulmonary neg pulmonary ROS,          Cardiovascular + Peripheral Vascular Disease + dysrhythmias (PVCs)  11/02/12 Study Conclusions  - Left ventricle: The cavity size was normal. Wall thickness   was increased in a pattern of mild LVH. The estimated   ejection fraction was 65%. Wall motion was normal; there   were no regional wall motion abnormalities. - Pericardium, extracardiac: A trivial pericardial effusion   was identified posterior to the heart. Transthoracic echocardiography.  M-mode, complete 2D, spectral Doppler, and color Doppler.  Height:  Height: 162.6cm. Height: 64in.  Weight:  Weight: 86.6kg. Weight: 190.6lb.  Body mass index:  BMI: 32.8kg/m^2.  Body surface area:    BSA: 1.70m^2.  Patient status:  Outpatient. Location:  Echo laboratory.    Neuro/Psych negative neurological ROS  negative psych ROS   GI/Hepatic negative GI ROS, Sarcoidosis by liver bx    Endo/Other  diabetes, Type 2Obese Left breast CA III poss IV  Renal/GU negative Renal ROS     Musculoskeletal negative musculoskeletal ROS (+)   Abdominal (+) + obese,   Peds negative pediatric ROS (+)  Hematology  (+) Blood dyscrasia, ,   Anesthesia Other Findings   Reproductive/Obstetrics negative OB ROS                         Anesthesia Physical Anesthesia Plan  ASA: III  Anesthesia Plan: General   Post-op Pain Management:    Induction: Intravenous  Airway Management Planned: Oral ETT  Additional Equipment:   Intra-op  Plan:   Post-operative Plan: Extubation in OR  Informed Consent: I have reviewed the patients History and Physical, chart, labs and discussed the procedure including the risks, benefits and alternatives for the proposed anesthesia with the patient or authorized representative who has indicated his/her understanding and acceptance.     Plan Discussed with: CRNA, Anesthesiologist and Surgeon  Anesthesia Plan Comments:         Anesthesia Quick Evaluation

## 2013-04-10 NOTE — Transfer of Care (Signed)
Immediate Anesthesia Transfer of Care Note  Patient: Brenda Avery  Procedure(s) Performed: Procedure(s) with comments: LEFT MODIFIED RADICAL MASTECTOMY  (Left) RIGHT TOTAL  MASTECTOMY WITH AXILLARY SENTINEL NODE BIOPSY (Right) - Methylene Blue Injection  Patient Location: PACU  Anesthesia Type:General  Level of Consciousness: awake, alert  and patient cooperative  Airway & Oxygen Therapy: Patient Spontanous Breathing and Patient connected to nasal cannula oxygen  Post-op Assessment: Report given to PACU RN and Post -op Vital signs reviewed and stable  Post vital signs: Reviewed and stable  Complications: No apparent anesthesia complications

## 2013-04-10 NOTE — OR Nursing (Signed)
Specimens labeled "1 - right axillary sentinel lymph node" and "2 - right axillary sentinel lymph node" tubed to pathology by Alpha Gula, RN at 10:32 for touchprep. Called pathology to notify that two specimens had been sent at 10:34. Pathology called to verify that the two specimens had been received at 10:40. Pathology called with results within 20 minutes.  Oralia Manis, RN

## 2013-04-10 NOTE — Anesthesia Procedure Notes (Signed)
Procedure Name: Intubation Date/Time: 04/10/2013 9:25 AM Performed by: Leona Singleton A Pre-anesthesia Checklist: Patient identified, Emergency Drugs available, Suction available and Patient being monitored Patient Re-evaluated:Patient Re-evaluated prior to inductionOxygen Delivery Method: Circle system utilized Preoxygenation: Pre-oxygenation with 100% oxygen Intubation Type: IV induction Ventilation: Mask ventilation without difficulty Laryngoscope Size: Miller and 2 Grade View: Grade I Tube type: Oral Tube size: 7.5 mm Number of attempts: 1 Airway Equipment and Method: Stylet Placement Confirmation: ETT inserted through vocal cords under direct vision,  positive ETCO2 and breath sounds checked- equal and bilateral Secured at: 22 cm Tube secured with: Tape Dental Injury: Teeth and Oropharynx as per pre-operative assessment

## 2013-04-10 NOTE — Anesthesia Postprocedure Evaluation (Signed)
Anesthesia Post Note  Patient: Brenda Avery  Procedure(s) Performed: Procedure(s) (LRB): LEFT MODIFIED RADICAL MASTECTOMY  (Left) RIGHT TOTAL  MASTECTOMY WITH AXILLARY SENTINEL NODE BIOPSY (Right)  Anesthesia type: General  Patient location: PACU  Post pain: Pain level controlled and Adequate analgesia  Post assessment: Post-op Vital signs reviewed, Patient's Cardiovascular Status Stable, Respiratory Function Stable, Patent Airway and Pain level controlled  Last Vitals:  Filed Vitals:   04/10/13 1300  BP:   Pulse: 60  Temp:   Resp: 15    Post vital signs: Reviewed and stable  Level of consciousness: awake, alert  and oriented  Complications: No apparent anesthesia complications

## 2013-04-10 NOTE — Progress Notes (Signed)
Arrived to room 6n12 from pacu, moved self from stretcher to bed without difficulty, husband at bedside, oriented to room and surroundings, denies nausea/pain at this time

## 2013-04-10 NOTE — Interval H&P Note (Signed)
History and Physical Interval Note:  04/10/2013 8:41 AM  Brenda Avery  has presented today for surgery, with the diagnosis of left breast cancer  The goals and the various methods of treatment have been discussed with the patient and family. After consideration of risks, benefits and other options for treatment, the patient has consented to  Procedure(s): LEFT MODIFIED RADICAL MASTECTOMY  (Left) RIGHT TOTAL  MASTECTOMY WITH AXILLARY SENTINEL NODE BIOPSY (Right) as a surgical intervention .  The patient's history has been reviewed, patient examined today , no change in status, stable for surgery.  I have reviewed the patient's chart and labs.  Questions were answered to the patient's satisfaction.     Angelia Mould. Derrell Lolling, M.D., [facility] General and Minimally invasive Surgery Breast and Colorectal Surgery Office:   [phone] Pager:   [phone]

## 2013-04-10 NOTE — Op Note (Signed)
Patient Name:           Brenda Avery   Date of Surgery:        04/10/2013  Pre op Diagnosis:      Locally advanced cancer left breast, infiltrating ductal type, clinical stage III versus clinical stage IV, receptor positive, HER-2/neu negative. Pretreatment clinical stage T3, N1. Status post neoadjuvant chemotherapy with a partial downstaging response. Abnormal MRI right breast, right breast cancer suspecting that biopsy was declined Biopsy proven sarcoidosis of the liver  Post op Diagnosis:    same  Procedure:                 Inject blue dye right breast Right total mastectomy Right axillary sentinel node biopsy Left modified radical mastectomy  Surgeon:                     Angelia Mould. Derrell Lolling, M.D., FACS  Assistant:                      Chevis Pretty, M.D., FACS  Operative Indications:   Brenda Avery is a 42 y.o. female. She is brought to the hospital electively for definitive surgical management of her locally advanced cancer left breast, and suspected cancer of the right breast.   She initially presented with a large mass in the left breast, laterally and suspicious left axillary lymph nodes. Biopsy showed invasive mammary carcinoma.PET scan showed numerous areas of uptake in the liver and spine. Dr. Mariel Sleet initiated chemotherapy. Subsequently a liver biopsy was performed which showed sarcoidosis, and so it was felt that it is possible that she does not have stage IV breast cancer but stage III breast cancer and sarcoidosis. At that point an MRI was performed which  suggested more extensive disease in the left and 2 masses in the right breast, which were not suspected initially.   In April, she declined to have any further biopsies on the right, stating that she would like to go ahead with bilateral mastectomies when it is time to do her definitive surgery. She has had genetic testing and her BRCA1 and BRCA2 testing were negative. She knows that she will probably get radiation therapy on  the left, and that reconstructive surgery would need to be delayed.  She has been advised that they wish to leave the Port-A-Cath and for now.   Nuclear medicine PET scan performed all in 02/13/2013 shows decrease in metabolic activity in the mediastinum and bilateral hilar areas. Soft tissue mass in the upper outer left breast shows decrease in metabolic activity and it measures 4.3 cm compared to 6.8 cm on prior exam. There are no suspicious pulmonary nodules. Abdomen shows diffuse liver spots, previously biopsied showing granuloma consistent with sarcoid. Multiple hypermetabolic lesions in the spleen slightly decreased hypermetabolic external iliac and inguinal have resolved. End of treatment MRI was performed on 03/07/2013. This shows that the left breast again shows a large mass, although there has been a response. The tumor was originally 7.2 cm and is now 3.5 cm. There are numerous surrounding satellite nodules. This enhancement extending to the level of the chest wall. The lymph nodes looked normal. In the right breast there was a microlobulated mass at 10:00 measuring 1.8 cm date is a second mass on the right anteriorly and slightly medially to this mass measuring 9 mm. A simple cyst at the 9:00 position.   I have explained to her that she almost certainly has multi-quadrant disease  in the left breast and axillary disease of the left most likely. I have told her that the standard of care for the left breast as a left modified radical mastectomy, postop radiation therapy, and delayed reconstruction if she chooses that. I have told her that we are uncertain about her right breast, since it was never biopsied.  After discussing her recent MRI, she says that it is her desire to have bilateral mastectomy and delayed reconstruction. She knows that we will do a complete axillary lymph node dissection left. I advised her to have a sentinel lymph node biopsy on the right since there may be malignancy on the  right.she agrees with this plan.   Operative Findings:       Both breasts are very large. There was a palpable mass in the upper outer quadrant of the left breast, consistent with her known cancer. The axillary lymph nodes on the left were not enlarged but several of them felt rather firm. There was no evidence of chest wall involvement. On the left side I identified and preserved the long thoracic nerve and the thoracodorsal neurovascular bundle.The right breast felt normal. I found 2 sentinel lymph nodes on the right side, and intraoperative evaluation by Dr. Frederica Kuster in pathology showed that there was no evidence of cancer in these lymph nodes.  Procedure in Detail:          The patient underwent injection of radionuclide into the right breast in the preop area by the nuclear medicine technician.  The patient was taken to the operating room. Gen. Anesthesia was induced. Surgical time out was performed. Intravenous antibiotics were given. Following alcohol prep, I injected 5 cc of blue dye into the right breast, subareolar area. This was 2 cc of methylene blue mixed with 3 cc of saline. The breast was massaged for 3 or 4 minutes. We then prepped and draped the entire chest wall from the upper neck to the umbilicus including both axilla and posterior lateral chest walls. Using a marking pen I very carefully marked the bilateral elliptical incisions. Because her breasts are very large this took a great deal of skin. I performed a right mastectomy first.    In the right breast a transverse elliptical incision was made. Skin flaps were raised superiorly to the infraclavicular area, being careful to preserve the Port-A-Cath, medially to the parasternal area, inferiorly to the anterior rectus sheath, and laterally to latissimus dorsi muscle. Using the neoprobe I dissected up into the axilla and found two sentinel lymph nodes. Both of these were very hot and one was very blue. There was no other radioactivity or blue  dye that could be seen. Both of these nodes were examined by Dr. Frederica Kuster in  pathology and they were negative for cancer cells. The right breast was then dissected off of the pectoralis major and minor muscles with electrocautery. The lateral skin margin was marked with a silk suture and  the specimen passed off. Hemostasis was excellent and achieved with electrocautery. The wound was irrigated with saline and packed.  I then made a mirror image transverse elliptical incision in the left breast. Skin flaps were raised superiorly, medially, inferiorly, and laterally in a similar fashion. The left breast was dissected off of the pectoralis major and minor muscles. I then incised the clavipectoral fascia and dissected up into the axillary space. I took the dissection all the way up and identified the axillary vein. I then dissected all level I level II axillary contents  out. A few small sensory nerves and venous tributaries  were controlled with metal clips and divided. The long thoracic nerve and thoracodorsal neurovascular bundle were identified and preserved. The left breast and axilla contents were sent as a single specimen to the lab. I marked the lateral skin margin of the left breast with a silk suture. Left mastectomy wound was irrigated with saline. Hemostasis was excellent.  On both sides I placed two19 [place] Blake drains, for a total of four drains. On each side,  one of these went up into the axilla and one across the skin flaps. These were brought out through separate stab incisions inferolaterally and sutured to the skin with nylon sutures and connected to suction bulbs. On both sides the skin was closed with skin staples. The drains were connected and had a good airtight seal. The wounds were cleansed and covered with sterile dry bandages and a breast binder. The patient tolerated the procedure well taken to recovery in stable condition. EBL 100 cc or less, counts correct. Complications  none.     Angelia Mould. Derrell Lolling, M.D., FACS General and Minimally Invasive Surgery Breast and Colorectal Surgery  04/10/2013 12:06 PM

## 2013-04-10 NOTE — Preoperative (Signed)
Beta Blockers   Reason not to administer Beta Blockers:Not Applicable 

## 2013-04-11 ENCOUNTER — Encounter (HOSPITAL_COMMUNITY): Payer: Self-pay | Admitting: General Surgery

## 2013-04-11 LAB — CBC
Hemoglobin: 10 g/dL — ABNORMAL LOW (ref 12.0–15.0)
MCV: 77.8 fL — ABNORMAL LOW (ref 78.0–100.0)
Platelets: 197 10*3/uL (ref 150–400)
RBC: 3.79 MIL/uL — ABNORMAL LOW (ref 3.87–5.11)
WBC: 6.4 10*3/uL (ref 4.0–10.5)

## 2013-04-11 LAB — BASIC METABOLIC PANEL
CO2: 25 mEq/L (ref 19–32)
Calcium: 8.8 mg/dL (ref 8.4–10.5)
Potassium: 3 mEq/L — ABNORMAL LOW (ref 3.5–5.1)
Sodium: 139 mEq/L (ref 135–145)

## 2013-04-11 MED ORDER — SODIUM CHLORIDE 0.9 % IV SOLN
INTRAVENOUS | Status: DC
  Administered 2013-04-11: 20:00:00 via INTRAVENOUS

## 2013-04-11 MED ORDER — TRAMADOL HCL 50 MG PO TABS
100.0000 mg | ORAL_TABLET | Freq: Two times a day (BID) | ORAL | Status: DC | PRN
  Administered 2013-04-11: 100 mg via ORAL
  Filled 2013-04-11: qty 2

## 2013-04-11 MED ORDER — POTASSIUM CHLORIDE CRYS ER 20 MEQ PO TBCR
40.0000 meq | EXTENDED_RELEASE_TABLET | Freq: Once | ORAL | Status: AC
  Administered 2013-04-11: 40 meq via ORAL
  Filled 2013-04-11: qty 2

## 2013-04-11 MED ORDER — POTASSIUM CHLORIDE IN NACL 20-0.9 MEQ/L-% IV SOLN
INTRAVENOUS | Status: DC
  Administered 2013-04-11: 21:00:00 via INTRAVENOUS
  Filled 2013-04-11 (×2): qty 1000

## 2013-04-11 MED ORDER — POTASSIUM CHLORIDE 10 MEQ/100ML IV SOLN
10.0000 meq | INTRAVENOUS | Status: AC
  Administered 2013-04-11: 10 meq via INTRAVENOUS
  Filled 2013-04-11: qty 400

## 2013-04-11 NOTE — Progress Notes (Signed)
1 Day Post-Op  Subjective: Stable and alert. Comfortable resting in bed. Notes incisional pain right greater than left when moving around. Able to ambulate to the bathroom. Voiding uneventfully. Has been nauseated and she has vomited which was not knownto the nursing staff.  Hemoglobin 10.0. Potassium 3.0. Creatinine 0.79. Glucose 117.  Objective: Vital signs in last 24 hours: Temp:  [97.8 F (36.6 C)-99.5 F (37.5 C)] 99 F (37.2 C) (08/26 0527) Pulse Rate:  [56-108] 61 (08/26 0527) Resp:  [9-22] 16 (08/26 0527) BP: (115-157)/(51-100) 129/54 mmHg (08/26 0527) SpO2:  [96 %-100 %] 97 % (08/26 0527) Weight:  [195 lb 8 oz (88.678 kg)] 195 lb 8 oz (88.678 kg) (08/26 0300)    Intake/Output from previous day: 08/25 0701 - 08/26 0700 In: 1610 [I.V.:1600] Out: 770 [Urine:500; Drains:170; Blood:100] Intake/Output this shift: Total I/O In: -  Out: 90 [Drains:90]   EXAM: General appearance: alert. Cooperative. Minimal distress. Mental status normal. Pleasant. Her husband is in room.Marland Kitchen Resp: clear to auscultation bilaterally Breasts: , bilateral mastectomy incisions and  the skin flaps are flat. No apparent hematoma. All four JP drains functioning. Serosanguineous.170 cc out total since arriving on the floor from PACU.   Lab Results:  Results for orders placed during the hospital encounter of 04/10/13 (from the past 24 hour(s))  GLUCOSE, CAPILLARY     Status: Abnormal   Collection Time    04/10/13  8:23 AM      Result Value Range   Glucose-Capillary 113 (*) 70 - 99 mg/dL  BASIC METABOLIC PANEL     Status: Abnormal (Preliminary result)   Collection Time    04/11/13  4:53 AM      Result Value Range   Sodium 139  135 - 145 mEq/L   Potassium 3.0 (*) 3.5 - 5.1 mEq/L   Chloride 104  96 - 112 mEq/L   CO2 25  19 - 32 mEq/L   Glucose, Bld PENDING  70 - 99 mg/dL   BUN 8  6 - 23 mg/dL   Creatinine, Ser PENDING  0.50 - 1.10 mg/dL   Calcium 8.8  8.4 - 16.1 mg/dL   GFR calc non Af Amer  PENDING  >90 mL/min   GFR calc Af Amer PENDING  >90 mL/min  CBC     Status: Abnormal   Collection Time    04/11/13  4:53 AM      Result Value Range   WBC 6.4  4.0 - 10.5 K/uL   RBC 3.79 (*) 3.87 - 5.11 MIL/uL   Hemoglobin 10.0 (*) 12.0 - 15.0 g/dL   HCT 09.6 (*) 04.5 - 40.9 %   MCV 77.8 (*) 78.0 - 100.0 fL   MCH 26.4  26.0 - 34.0 pg   MCHC 33.9  30.0 - 36.0 g/dL   RDW 81.1  91.4 - 78.2 %   Platelets 197  150 - 400 K/uL     Studies/Results: @RISRSLT24 @  .  ceFAZolin (ANCEF) IV  2 g Intravenous Q8H  . enoxaparin (LOVENOX) injection  40 mg Subcutaneous Q24H  . potassium chloride SA  20 mEq Oral BID     Assessment/Plan: s/p Procedure(s): LEFT MODIFIED RADICAL MASTECTOMY  RIGHT TOTAL  MASTECTOMY WITH AXILLARY SENTINEL NODE BIOPSY  POD #1. Bilateral total mastectomy, right axillary sentinel mode biopsy, left axillary lymph node dissection. Stable. Will need another day in the hospital because of nausea and vomiting and pain control Will allow Ultram to see if that has less nausea than narcotics Increase  ambulation Advance diet Anticipate that she will be eligible for discharge tomorrow.  Hypokalemia. Will continue home meds potassium supplementation and will give IV supplementation. Check B. Met tomorrow.  Sarcoidosis.    @PROBHOSP @  LOS: 1 day    Haldon Carley M. Derrell Lolling, M.D., [facility] General and Minimally invasive Surgery Breast and Colorectal Surgery Office:   [phone] Pager:   [phone]  04/11/2013  . .prob

## 2013-04-12 ENCOUNTER — Encounter (INDEPENDENT_AMBULATORY_CARE_PROVIDER_SITE_OTHER): Payer: Self-pay

## 2013-04-12 LAB — BASIC METABOLIC PANEL
CO2: 25 mEq/L (ref 19–32)
Chloride: 104 mEq/L (ref 96–112)
Creatinine, Ser: 0.69 mg/dL (ref 0.50–1.10)
Sodium: 138 mEq/L (ref 135–145)

## 2013-04-12 MED ORDER — OXYCODONE-ACETAMINOPHEN 5-325 MG PO TABS: 1.0000 | ORAL_TABLET | ORAL | Status: DC | PRN

## 2013-04-12 NOTE — Discharge Summary (Signed)
Patient ID: Brenda Avery [id number] 42 y.o. [birth year]  Admit date: 04/10/2013  Discharge date and time: 04/12/2013  Admitting Physician: Ernestene Mention  Discharge Physician: Ernestene Mention  Admission Diagnoses: left breast cancer  Discharge Diagnoses: 1)   Locally advanced cancer left breast, infiltrating ductal type, clinical stage III versus clinical stage IV, receptor positive, HER-2/neu negative. Pretreatment clinical stage T3, N1.  2)  Status post neoadjuvant chemotherapy with a partial downstaging response.  3)  Abnormal MRI right breast, right breast cancer suspecting that biopsy was declined  4)  Biopsy proven sarcoidosis of the liver   Operations: Procedure(s): LEFT MODIFIED RADICAL MASTECTOMY  RIGHT TOTAL  MASTECTOMY WITH AXILLARY SENTINEL NODE BIOPSY  Admission Condition: good  Discharged Condition: good  Indication for Admission: Brenda Avery is a 42 y.o. female. She is brought to the hospital electively for definitive surgical management of her locally advanced cancer left breast, and suspected cancer of the right breast.  She initially presented with a large mass in the left breast, laterally and suspicious left axillary lymph nodes. Biopsy showed invasive mammary carcinoma.PET scan showed numerous areas of uptake in the liver and spine. Dr. Mariel Sleet initiated chemotherapy. Subsequently a liver biopsy was performed which showed sarcoidosis, and so it was felt that it is possible that she does not have stage IV breast cancer but stage III breast cancer and sarcoidosis. At that point an MRI was performed which suggested more extensive disease in the left and 2 masses in the right breast, which were not suspected initially.  In April, she declined to have any further biopsies on the right, stating that she would like to go ahead with bilateral mastectomies when it is time to do her definitive surgery. She has had genetic testing and her BRCA1 and BRCA2 testing were  negative. She knows that she will probably get radiation therapy on the left, and that reconstructive surgery would need to be delayed.  She has been advised that they wish to leave the Port-A-Cath and for now.  Nuclear medicine PET scan performed all in 02/13/2013 shows decrease in metabolic activity in the mediastinum and bilateral hilar areas. Soft tissue mass in the upper outer left breast shows decrease in metabolic activity and it measures 4.3 cm compared to 6.8 cm on prior exam. There are no suspicious pulmonary nodules. Abdomen shows diffuse liver spots, previously biopsied showing granuloma consistent with sarcoid. Multiple hypermetabolic lesions in the spleen slightly decreased hypermetabolic external iliac and inguinal have resolved. End of treatment MRI was performed on 03/07/2013. This shows that the left breast again shows a large mass, although there has been a response. The tumor was originally 7.2 cm and is now 3.5 cm. There are numerous surrounding satellite nodules. This enhancement extending to the level of the chest wall. The lymph nodes looked normal. In the right breast there was a microlobulated mass at 10:00 measuring 1.8 cm date is a second mass on the right anteriorly and slightly medially to this mass measuring 9 mm. A simple cyst at the 9:00 position.  I have explained to her that she almost certainly has multi-quadrant disease in the left breast and axillary disease of the left most likely. I have told her that the standard of care for the left breast as a left modified radical mastectomy, postop radiation therapy, and delayed reconstruction if she chooses that. I have told her that we are uncertain about her right breast, since it was never biopsied.  After discussing her recent MRI,  she says that it is her desire to have bilateral mastectomy and delayed reconstruction. She knows that we will do a complete axillary lymph node dissection left. I advised her to have a sentinel lymph  node biopsy on the right since there may be malignancy on the right.she agrees with this plan.   Hospital Course: on the day of admission the patient was taken to the operating room and underwent bilateral total mastectomies, complete left axillary lymph node dissection, right axillary sentinel node biopsy.final pathology is pending at the time of this dictation. Postoperatively the patient did fairly well. On postop day one she was nauseated and vomited some and did not feel ready to go home. Readjusted her pain medicines. By postop day 2 she was ambulatory tolerating a diet and wanted to go home. At the time of discharge both mastectomy wounds look good, skin flaps were healthy without hematoma. All 4 drains were functioning with serosanguineous drainage. The wound was redressed. She was given instructions in wound care, diet and activities. She was given a prescription for Percocet. She has an appointment to see me and an appointment to see Dr. Drue Second in about one week.  Consults: None  Significant Diagnostic Studies: surgical pathology, pending  Treatments: surgery: bilateral total mastectomy, left axillary lymph node dissection, right axillary sentinel node biopsy  Disposition: Home  Patient Instructions:    Medication List         oxyCODONE-acetaminophen 5-325 MG per tablet  Commonly known as:  PERCOCET/ROXICET  Take 1-2 tablets by mouth every 4 (four) hours as needed.     potassium chloride SA 20 MEQ tablet  Commonly known as:  K-DUR,KLOR-CON  Take 1 tablet (20 mEq total) by mouth 2 (two) times daily.        Activity: activity as tolerated. Arm range of motion limitations were discussed.  And no driving. No heavy lifting. Diet: low fat, low cholesterol diet Wound Care: as directed  Follow-up:  With Dr. Derrell Lolling in 1 week.  Signed: Angelia Mould. Derrell Lolling, M.D., FACS General and minimally invasive surgery Breast and Colorectal Surgery  04/12/2013, 5:24 AM

## 2013-04-14 ENCOUNTER — Ambulatory Visit (HOSPITAL_COMMUNITY): Payer: Medicaid Other

## 2013-04-14 ENCOUNTER — Encounter (INDEPENDENT_AMBULATORY_CARE_PROVIDER_SITE_OTHER): Payer: Self-pay | Admitting: General Surgery

## 2013-04-18 ENCOUNTER — Encounter (HOSPITAL_COMMUNITY): Payer: Self-pay

## 2013-04-20 ENCOUNTER — Encounter (INDEPENDENT_AMBULATORY_CARE_PROVIDER_SITE_OTHER): Payer: Self-pay | Admitting: General Surgery

## 2013-04-20 ENCOUNTER — Ambulatory Visit (INDEPENDENT_AMBULATORY_CARE_PROVIDER_SITE_OTHER): Payer: Medicaid Other | Admitting: General Surgery

## 2013-04-20 VITALS — HR 84 | Temp 98.2°F | Resp 18 | Ht 64.0 in | Wt 190.0 lb

## 2013-04-20 DIAGNOSIS — C50419 Malignant neoplasm of upper-outer quadrant of unspecified female breast: Secondary | ICD-10-CM

## 2013-04-20 DIAGNOSIS — C50412 Malignant neoplasm of upper-outer quadrant of left female breast: Secondary | ICD-10-CM

## 2013-04-20 NOTE — Patient Instructions (Signed)
You are recovering from the bilateral mastectomy surgery without any obvious complications. The wounds appear  to be healing nicely.  We removed both drains from the right side today and we removed all the staples.  Return to see Dr. Derrell Lolling in one week. Hopefully we can remove the other drains then  You will be referred to physical therapy after all the drains are out  Keep your appointment with Dr. Welton Flakes next week.

## 2013-04-20 NOTE — Progress Notes (Signed)
Patient ID: Brenda Avery, female   DOB: 12-12-1970, 42 y.o.   MRN: [id number] History: This patient returns 10 days postop bilateral mastectomy, left axillary lymph node dissection, and right axillary sentinel node biopsy. Pathology report shows that the tumor on the left side was 5.7 cm. 8/11 lymph nodes were positive. Negative margins.There was no cancer of the right breast. She is feeling fine. Sleeping okay. Not much pain. Almost no drainage on the right. 10-20 cc per day out of the left. She has an appointment she Dr. Drue Second next week. I told her that she would probably be offered radiation therapy.  Exam:  in good spirits. Family member with her. Bilateral mastectomy incisions looked very good. Skin is very healthy. All staples were removed and Steri-Strips were applied. I removed both of the drains on the right side. I left the drains on the left side for another week because of the extent of surgery.  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 Pretreatment clinical stage T3, N1. Pathologic stage ypT3, ypN2a. Some downstaging from neoadjuvant chemotherapy left breast  Abnormal MRI right breast, right breast cancer suspected but biopsy was declined previously. NED on final pathology  BRCA one and BRCA2 negative  Biopsy-proven sarcoidosis the liver  Plan: Return to see me in one week. Referred to physical therapy after remaining drains removed Encouraged diet, hydration, and angulation Keep the appt. with Dr. Drue Second next week    [facility]. Derrell Lolling, M.D., [facility] General and Minimally invasive Surgery Breast and Colorectal Surgery Office:   [phone] Pager:   [phone]

## 2013-04-21 ENCOUNTER — Encounter (INDEPENDENT_AMBULATORY_CARE_PROVIDER_SITE_OTHER): Payer: Medicaid Other | Admitting: General Surgery

## 2013-04-24 ENCOUNTER — Telehealth: Payer: Self-pay | Admitting: *Deleted

## 2013-04-24 ENCOUNTER — Ambulatory Visit (HOSPITAL_BASED_OUTPATIENT_CLINIC_OR_DEPARTMENT_OTHER): Payer: Medicaid Other | Admitting: Oncology

## 2013-04-24 ENCOUNTER — Encounter: Payer: Self-pay | Admitting: Oncology

## 2013-04-24 ENCOUNTER — Other Ambulatory Visit (HOSPITAL_BASED_OUTPATIENT_CLINIC_OR_DEPARTMENT_OTHER): Payer: Medicaid Other | Admitting: Lab

## 2013-04-24 ENCOUNTER — Ambulatory Visit: Payer: Medicaid Other

## 2013-04-24 VITALS — BP 166/108 | HR 92 | Temp 98.3°F | Resp 18 | Ht 64.0 in | Wt 188.0 lb

## 2013-04-24 DIAGNOSIS — C50419 Malignant neoplasm of upper-outer quadrant of unspecified female breast: Secondary | ICD-10-CM

## 2013-04-24 DIAGNOSIS — C50412 Malignant neoplasm of upper-outer quadrant of left female breast: Secondary | ICD-10-CM

## 2013-04-24 DIAGNOSIS — C50912 Malignant neoplasm of unspecified site of left female breast: Secondary | ICD-10-CM

## 2013-04-24 DIAGNOSIS — C800 Disseminated malignant neoplasm, unspecified: Secondary | ICD-10-CM

## 2013-04-24 DIAGNOSIS — C50919 Malignant neoplasm of unspecified site of unspecified female breast: Secondary | ICD-10-CM

## 2013-04-24 LAB — CBC WITH DIFFERENTIAL/PLATELET
BASO%: 0.5 % (ref 0.0–2.0)
Basophils Absolute: 0 10*3/uL (ref 0.0–0.1)
Eosinophils Absolute: 0.1 10*3/uL (ref 0.0–0.5)
HCT: 33.3 % — ABNORMAL LOW (ref 34.8–46.6)
HGB: 11 g/dL — ABNORMAL LOW (ref 11.6–15.9)
LYMPH%: 12 % — ABNORMAL LOW (ref 14.0–49.7)
MCHC: 32.9 g/dL (ref 31.5–36.0)
MONO#: 0.4 10*3/uL (ref 0.1–0.9)
NEUT%: 74.2 % (ref 38.4–76.8)
Platelets: 280 10*3/uL (ref 145–400)
WBC: 4.4 10*3/uL (ref 3.9–10.3)

## 2013-04-24 LAB — COMPREHENSIVE METABOLIC PANEL (CC13)
BUN: 8.1 mg/dL (ref 7.0–26.0)
CO2: 25 mEq/L (ref 22–29)
Calcium: 9.3 mg/dL (ref 8.4–10.4)
Chloride: 108 mEq/L (ref 98–109)
Creatinine: 0.8 mg/dL (ref 0.6–1.1)
Glucose: 123 mg/dl (ref 70–140)
Total Bilirubin: 0.35 mg/dL (ref 0.20–1.20)

## 2013-04-24 MED ORDER — CAPECITABINE 500 MG PO TABS: 625.0000 mg/m2 | ORAL_TABLET | Freq: Two times a day (BID) | ORAL | Status: DC

## 2013-04-24 NOTE — Telephone Encounter (Signed)
appts made and printed...td 

## 2013-04-24 NOTE — Progress Notes (Signed)
Checked in new pt with no financial concerns. °

## 2013-04-24 NOTE — Progress Notes (Signed)
Faxed xeloda prescription to [facility]. °

## 2013-04-24 NOTE — Patient Instructions (Addendum)
#1 we discussed your final pathology today.  #2 you will need radiation therapy postmastectomy. We discussed radiosensitizing chemotherapy consisting of Xeloda thousand milligrams twice a day Monday through Friday concurrently with radiation.  #3 I will refer you to radiation oncology. The plan on having the radiation done here in [place].  #4 I will see you back in 2 weeks' time for followup  Capecitabine tablets What is this medicine? CAPECITABINE (ka pe SITE a been) is a chemotherapy drug. It slows the growth of cancer cells. This medicine is used to treat breast cancer, and also colon or rectal cancer. This medicine may be used for other purposes; ask your health care provider or pharmacist if you have questions. What should I tell my health care provider before I take this medicine? They need to know if you have any of these conditions: -bleeding or blood disorders -dihydropyrimidine dehydrogenase (DPD) deficiency -heart disease -infection (especially a virus infection such as chickenpox, cold sores, or herpes) -kidney disease -liver disease -an unusual or allergic reaction to capecitabine, 5-fluorouracil, other medicines, foods, dyes, or preservatives -pregnant or trying to get pregnant -breast-feeding How should I use this medicine? Take this medicine by mouth with a glass of water, within 30 minutes of the end of a meal. Follow the directions on the prescription label. Take your medicine at regular intervals. Do not take it more often than directed. Do not stop taking except on your doctor's advice. Your doctor may want you to take a combination of 150 mg and 500 mg tablets for each dose. It is very important that you know how to correctly take your dose. Taking the wrong tablets could result in an overdose (too much medication) or underdose (too little medication). Talk to your pediatrician regarding the use of this medicine in children. Special care may be needed. Overdosage:  If you think you have taken too much of this medicine contact a poison control center or emergency room at once. NOTE: This medicine is only for you. Do not share this medicine with others. What if I miss a dose? If you miss a dose, do not take the missed dose at all. Do not take double or extra doses. Instead, continue with your next scheduled dose and check with your doctor. What may interact with this medicine? -antacids with aluminum and/or magnesium -folic acid -leucovorin -medicines to increase blood counts like filgrastim, pegfilgrastim, sargramostim -phenytoin -vaccines -warfarin Talk to your doctor or health care professional before taking any of these medicines: -acetaminophen -aspirin -ibuprofen -ketoprofen -naproxen This list may not describe all possible interactions. Give your health care provider a list of all the medicines, herbs, non-prescription drugs, or dietary supplements you use. Also tell them if you smoke, drink alcohol, or use illegal drugs. Some items may interact with your medicine. What should I watch for while using this medicine? Visit your doctor for checks on your progress. This drug may make you feel generally unwell. This is not uncommon, as chemotherapy can affect healthy cells as well as cancer cells. Report any side effects. Continue your course of treatment even though you feel ill unless your doctor tells you to stop. In some cases, you may be given additional medicines to help with side effects. Follow all directions for their use. Call your doctor or health care professional for advice if you get a fever, chills or sore throat, or other symptoms of a cold or flu. Do not treat yourself. This drug decreases your body's ability to fight infections.  Try to avoid being around people who are sick. This medicine may increase your risk to bruise or bleed. Call your doctor or health care professional if you notice any unusual bleeding. Be careful brushing and  flossing your teeth or using a toothpick because you may get an infection or bleed more easily. If you have any dental work done, tell your dentist you are receiving this medicine. Avoid taking products that contain aspirin, acetaminophen, ibuprofen, naproxen, or ketoprofen unless instructed by your doctor. These medicines may hide a fever. Do not become pregnant while taking this medicine. Women should inform their doctor if they wish to become pregnant or think they might be pregnant. There is a potential for serious side effects to an unborn child. Talk to your health care professional or pharmacist for more information. Do not breast-feed an infant while taking this medicine. Men are advised not to father a child while taking this medicine. What side effects may I notice from receiving this medicine? Side effects that you should report to your doctor or health care professional as soon as possible: -allergic reactions like skin rash, itching or hives, swelling of the face, lips, or tongue -low blood counts - this medicine may decrease the number of white blood cells, red blood cells and platelets. You may be at increased risk for infections and bleeding. -signs of infection - fever or chills, cough, sore throat, pain or difficulty passing urine -signs of decreased platelets or bleeding - bruising, pinpoint red spots on the skin, black, tarry stools, blood in the urine -signs of decreased red blood cells - unusually weak or tired, fainting spells, lightheadedness -breathing problems -changes in vision -chest pain -diarrhea of more than 4 bowel movements in one day or any diarrhea at night -mouth sores -nausea and vomiting -pain, swelling, redness at site where injected -pain, tingling, numbness in the hands or feet -redness, swelling, or sores on hands or feet -stomach pain -vomiting -yellow color of skin or eyes Side effects that usually do not require medical attention (report to your  doctor or health care professional if they continue or are bothersome): -constipation -diarrhea -dry or itchy skin -hair loss -loss of appetite -nausea -weak or tired This list may not describe all possible side effects. Call your doctor for medical advice about side effects. You may report side effects to FDA at 1-800-FDA-1088. Where should I keep my medicine? Keep out of the reach of children. Store at room temperature between 15 and 30 degrees C (59 and 86 degrees F). Keep container tightly closed. Throw away any unused medicine after the expiration date. NOTE: This sheet is a summary. It may not cover all possible information. If you have questions about this medicine, talk to your doctor, pharmacist, or health care provider.  2013, [employer]. (07/16/2008 11:47:41 AM)

## 2013-04-25 ENCOUNTER — Encounter (INDEPENDENT_AMBULATORY_CARE_PROVIDER_SITE_OTHER): Payer: Self-pay | Admitting: General Surgery

## 2013-04-25 ENCOUNTER — Ambulatory Visit (INDEPENDENT_AMBULATORY_CARE_PROVIDER_SITE_OTHER): Payer: Medicaid Other | Admitting: General Surgery

## 2013-04-25 VITALS — BP 120/86 | HR 75 | Temp 96.8°F | Resp 16 | Ht 64.0 in | Wt 188.4 lb

## 2013-04-25 DIAGNOSIS — C50412 Malignant neoplasm of upper-outer quadrant of left female breast: Secondary | ICD-10-CM

## 2013-04-25 DIAGNOSIS — C50419 Malignant neoplasm of upper-outer quadrant of unspecified female breast: Secondary | ICD-10-CM

## 2013-04-25 NOTE — Progress Notes (Signed)
Patient ID: Brenda Avery, female   DOB: July 30, 1971, 42 y.o.   MRN: [id number] History: She returns 16 days postop bilateral mastectomy, left axillary lymph node dissection and right axilla sentinel node biopsy. Pathology report shows a tumor on the left side was 5.7 cm. 8 of 11 lymph nodes were positive. Negative margins. No cancer in the right breast. She is feeling good. The drainage on the left side is down to less than 10 cc per day. She has seen Dr. Drue Second and has been referred to radiation oncology.  Exam: Good spirits. Family member with her. Right mastectomy wound looks good. No fluid collection or infection. Left mastectomy wound also looks good.That surgical complications. Drain was removed. Range of motion right shoulder about 150. Range of motion left shoulder about 120.  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 Pretreatment clinical stage T3, N1. Pathologic stage ypT3, ypN2a.  Some downstaging from neoadjuvant chemotherapy left breast  Abnormal MRI right breast, right breast cancer suspected but biopsy was declined previously. NED on final pathology  BRCA1 and BRCA2 negative  Biopsy-proven sarcoidosis the liver   Plan: Referred to physical therapy Prescription for postmastectomy bra and prosthesis given Remove Steri-Strips in one week Eventually will refer to plastic surgery for consultation regarding reconstructive options. This will occur after radiation therapy Keep appointment with radiation oncologist Return to see me in 4 weeks.    Angelia Mould. Derrell Lolling, M.D., [facility] General and Minimally invasive Surgery Breast and Colorectal Surgery Office:   [phone] Pager:   [phone]

## 2013-04-25 NOTE — Progress Notes (Incomplete)
Location of Breast Cancer:{Left:21944} Pathology reveals tumor to be 5.7 cm 8/11 lymph nodes were positive.  Histology per Pathology Report:Invasive ductal carcinoma grade II/III, ductal carcinoma in situ (metastatic)  Receptor Status: ER(+), PR (+), Her2-neu (-)  Patient presented with left breast pain and palpated mass at 2:00 position  Past/Anticipated interventions by surgeon:Left modified radical mastectomy and Right total mastectomy with axillary sentinel node biopsy on 04/10/2013  Past/Anticipated interventions by medical oncology, if any: Chemotherapy to consist of adriamycin and cytoxan followed by taxol under guidance of Dr.Neijstrom.  Lymphedema issues, if any:  {yes/no:18581} {Right/Left:21944}   Pain issues, if any:  {yes/no:18581} {pain description:21022940}  SAFETY ISSUES:  Prior radiation? {yes/no:18581}  Pacemaker/ICD? {yes/no:18581}  Possible current pregnancy?{yes/no:18581}  Is the patient on methotrexate? {yes/no:18581}  Current Complaints / other details:  ***    Tessa Lerner, RN 04/25/2013,1:10 PM

## 2013-04-25 NOTE — Patient Instructions (Signed)
We removed all of the remaining drains today. You may start taking a shower tomorrow.  You will be referred to physical therapy for range of motion exercises of your shoulders.  You have been given a prescription for a postmastectomy bra and prosthesis. Take this to [facility] on [address] and they will fit you.  You may remove the Steri-Strips 7-10 days from now  Keep your appointment with the radiation oncologist  Eventually, you'll be referred to plastic surgery for a consultation regarding reconstructive options. This cannot occur until you have finished your other forms of treatment  Return to see Dr. Derrell Lolling in 4 weeks, sooner if there are any problems.

## 2013-04-26 ENCOUNTER — Encounter: Payer: Self-pay | Admitting: Radiation Oncology

## 2013-04-26 ENCOUNTER — Ambulatory Visit
Admission: RE | Admit: 2013-04-26 | Discharge: 2013-04-26 | Disposition: A | Discharge status: Home / Self Care | Payer: Medicaid Other | Source: Ambulatory Visit | Attending: Radiation Oncology | Admitting: Radiation Oncology

## 2013-04-26 VITALS — BP 141/97 | HR 81 | Temp 98.5°F | Resp 20 | Wt 188.5 lb

## 2013-04-26 DIAGNOSIS — C50412 Malignant neoplasm of upper-outer quadrant of left female breast: Secondary | ICD-10-CM

## 2013-04-26 DIAGNOSIS — Z170 Estrogen receptor positive status [ER+]: Secondary | ICD-10-CM | POA: Insufficient documentation

## 2013-04-26 DIAGNOSIS — C50919 Malignant neoplasm of unspecified site of unspecified female breast: Secondary | ICD-10-CM | POA: Insufficient documentation

## 2013-04-26 DIAGNOSIS — Z9010 Acquired absence of unspecified breast and nipple: Secondary | ICD-10-CM | POA: Insufficient documentation

## 2013-04-26 NOTE — Progress Notes (Signed)
Please see the Nurse Progress Note in the MD Initial Consult Encounter for this patient. 

## 2013-04-26 NOTE — Progress Notes (Signed)
Location of Breast Cancer: Left UOQ Pathology reveals tumor to be 5.7 cm 8/11 lymph nodes were positive.   Histology per Pathology Report:Invasive ductal carcinoma grade II/III, ductal carcinoma in situ (metastatic)  08/19/12 Breast, left, needle core biopsy, UOQ - DUCTAL CARCINOMA IN SITU WITH ASSOCIATED CALCIFICATION. - SEE COMMENT. 2. Breast, left, needle core biopsy, mass, UOQ - INVASIVE MAMMARY CARCINOMA. - MAMMARY CARCInoma  04/10/13 Lymph node, sentinel, biopsy, right, axilla - THERE IS NO EVIDENCE OF CARCINOMA IN 1 OF 1 LYMPH NODE (0/1). 2. Lymph node, sentinel, biopsy, right, axilla - THERE IS NO EVIDENCE OF CARCINOMA IN 1 OF 1 LYMPH NODE (0/1). 3. Breast, simple mastectomy, right - FIBROCYSTIC CHANGES WITH CALCIFICATIONS. - THERE IS NO EVIDENCE OF MALIGNANCY. 4. Breast, modified radical mastectomy , Left - INVASIVE DUCTAL CARCINOMA, GRADE II/III, SPANNING 5.7 CM. - DUCTAL CARCINOMA IN SITU, HIGH GRADE. - LYMPHOVASCULAR INVASION IS IDENTIFIED. - METASTATIC CARCINOMA IN 8 OF 11 LYMPH NODES (8/11), WITH EXTRACAPSULAR EXTENSION. - THE SURGICAL RESECTION MARGINS ARE NEGATIVE FOR CARCINOMA. - SEE ONCOLOGY TABLE BELOW.  Receptor Status: ER(+), PR (+), Her2-neu (-)   Patient presented with left breast pain and palpated mass at 2:00 position   Past/Anticipated interventions by surgeon: Left modified radical mastectomy and Right total mastectomy with axillary sentinel node biopsy on 04/10/2013   Past/Anticipated interventions by medical oncology, if any: Chemotherapy :  adriamycin and cytoxan followed by taxol, completed 16 cycles , last chemo 02/06/13  Lymphedema issues, if any: none Pain issues, if any: post op soreness w/movement of arms, shoulders  SAFETY ISSUES:  Prior radiation?  no Pacemaker/ICD? no Possible current pregnancy? no Is the patient on methotrexate? no  Current Complaints / other details: Married, 2 children, worked at childcare facility. To begin Xeloda  w/initiation of radiation.  Mother w/pt today. Pt has tingling, numbness of toes, fingertips.

## 2013-04-27 NOTE — Progress Notes (Addendum)
Radiation Oncology         [phone]) [phone] ________________________________  Initial outpatient Consultation - Date: 04/26/2013   Name: Brenda Avery MRN: [id number]   DOB: [birth year]  REFERRING PHYSICIAN: Victorino December, MD  DIAGNOSIS: T3 N2 likely M1 left breast cancer status post neoadjuvant chemotherapy with residual disease in the breast and lymph nodes status post left mastectomy and axillary lymph node dissection and right prophylactic mastectomy and lymph node biopsy  HISTORY OF PRESENT ILLNESS::Brenda Avery is a 42 y.o. female  who experience shooting pains in the left breast beginning in November. An ultrasound at that time showed 2 separate masses in the left upper outer quadrant. The first mass was noted to be a simple cyst and a second mass was noted measuring 2.8 x 2.1 x 2.6 cm. A biopsy was performed which showed invasive mammary carcinoma and mammary carcinoma in situ. The calcifications were also biopsied in the upper outer quadrant showing DCIS.  no abnormal lymph nodes were noted. An MRI of the bilateral breast on 10/10/2012 showed bilateral parenchymal enhancement. A hypervascular mass was noted occupying the entire upper outer and lower outer quadrants measuring 11 x 5.5 x 7.5 cm. Numerous small satellite enhancing lesions were noted the largest measuring 1 cm. Multiple enlarged left axillary lymph nodes were noted with one measuring 2.5 cm in greatest dimension. On the right there were a couple masses which were also noted and concerning. A biopsy was recommended but she declined. The sternum on the MRI scan showed abnormal marrow signal and enhancement suspicious for osseous metastatic disease. A PET scan performed on January 29 showed hypermetabolic activity in the right supraclavicular region as well as hypermetabolic activity in the left subpectoral node with an SUV of 3.8 and uptake within the left upper breast mass. Hypermetabolic activity was noted throughout the left hilar,  suprahilar, right hilar and suprahilar adenopathy with SUV's of 8.7 10.0 and 6.2. A subcarinal lymph node had an SUV of 9.1. Small bilateral pleural effusions were noted but were indeterminate for malignancy. Hypermetabolic activity was noted in the liver and hypermetabolic right iliac and bilateral inguinal nodes were noted with SUVs of 6.7 and 7.8 respectively. Hypermetabolic activity was noted in the sternum with an SUV of 4.9 corresponding to a lytic lesion. 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 She underwent neoadjuvant chemotherapy with Dr. Mariel Sleet. She received Adriamycin Cytoxan and Taxol. A restaging PET/CT was performed on 02/13/2013. This showed marked response to treatment with the right supraclavicular lymph node now with an SUV of 3.3 down from 7.6 and SUV is up 3.6 and 4.4 as well as 4.2 in the mediastinal disease. The mass in the upper outer left breast showed an SUV of 4.3 compared to 6.8 on prior examination. Diffuse liver uptake was noted with several areas showing a decrease in hypermetabolic activity as well. The hypermetabolic activity in the sternum had resolved and no other hypermetabolic bone lesions were identified. She underwent bilateral mastectomies on 04/10/2013. 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 Lymphovascular invasion was identified. Margins were negative. 8/11 lymph nodes were positive with extracapsular extension. There was no evidence of malignancy in the right breast and no evidence of malignancy in 2 sentinel lymph nodes on the right. The tumor was ER positive at 99% PR positive at 14% and HER-2 negative. She saw Dr. Welton Flakes yesterday and was  referred to me for consideration of radiation in the management of her disease. She is accompanied by her mother-in-law. She has been genetically tested and is BRCA1 and 2 negative. She has been  referred to physical therapy.    PREVIOUS RADIATION THERAPY: No  PAST MEDICAL HISTORY:  has a past medical history of Hyperlipidemia; PVC's; Hematuria (2000/2001); Type II diabetes mellitus; and Breast CA.    PAST SURGICAL HISTORY: Past Surgical History  Procedure Laterality Date  . Rectocele repair  2004  . Cystoscopy  2001  . Nevus excision  2004    facial  . Portacath placement  09/20/2012    Procedure: INSERTION PORT-A-CATH;  Surgeon: Ernestene Mention, MD;  Location[facility] OR;  Service: General;  Laterality: N/A;  port a cath insertion with flouro and ultrasound   . Mastectomy Right 04/10/2013  . Mastectomy modified radical Left 04/10/2013  . Axillary sentinel node biopsy Right 04/10/2013  . Liver biopsy    . Breast biopsy Left 2014  . Tubal ligation  2004  . Mastectomy modified radical Left 04/10/2013    Procedure: LEFT MODIFIED RADICAL MASTECTOMY ;  Surgeon: Ernestene Mention, MD;  Location: [facility] OR;  Service: General;  Laterality: Left;  . Simple mastectomy with axillary sentinel node biopsy Right 04/10/2013    Procedure: RIGHT TOTAL  MASTECTOMY WITH AXILLARY SENTINEL NODE BIOPSY;  Surgeon: Ernestene Mention, MD;  Location: [facility] OR;  Service: General;  Laterality: Right;  Methylene Blue Injection    FAMILY HISTORY:  Family History  Problem Relation Age of Onset  . Hypertension Mother   . Diabetes Mother     MI  . Heart disease Mother   . Hypertension Sister     x4 only one HTN and one thats DI  . Hypertension Brother     x4 only 1 htn & dm  . Heart disease Father   . Breast cancer Maternal Aunt     diagnosed in her 80s  . Cancer Maternal Aunt     unknown  . Breast cancer Paternal Aunt     diagnosed in her 37s  . Cervical cancer Sister 62  . Breast cancer Maternal Aunt     diagnosed in her 20s  . Breast cancer Cousin     3 maternal cousins - 2 in their 74s, 1 in her 34s    SOCIAL HISTORY:  History  Substance Use Topics  . Smoking status: Never Smoker   . Smokeless  tobacco: Never Used  . Alcohol Use: No    ALLERGIES: Metronidazole and Sulfonamide derivatives  MEDICATIONS:  Current Outpatient Prescriptions  Medication Sig Dispense Refill  . oxyCODONE-acetaminophen (PERCOCET/ROXICET) 5-325 MG per tablet Take 1-2 tablets by mouth every 4 (four) hours as needed.  30 tablet  0  . potassium chloride SA (K-DUR,KLOR-CON) 20 MEQ tablet Take 1 tablet (20 mEq total) by mouth 2 (two) times daily.  60 tablet  1  . capecitabine (XELODA) 500 MG tablet Take 2 tablets (1,000 mg total) by mouth 2 (two) times daily after a meal.  120 tablet  1   No current facility-administered medications for this encounter.    REVIEW OF SYSTEMS:  A 15 point review of systems is documented in the electronic medical record. This was obtained by the nursing staff. However, I reviewed this with the patient to discuss relevant findings and make appropriate changes.  Pertinent items are noted in HPI.  PHYSICAL EXAM:  Filed Vitals:   04/26/13 1503  BP: 141/97  Pulse: 81  Temp: 98.5 F (36.9 C)  Resp: 20  .188 lb 8 oz (85.503 kg). ECoG performance status 0. She is a pleasant female in no distress sitting comfortably examining table. She has no palpable cervical or subclavicular adenopathy. She has no palpable or visible evidence of tumor recurrence along the chest wall. She still has Steri-Strips in place along her mastectomy scar which is healing well. She has no lymphedema. She is 5 out of 5 strength in her bilateral upper and lower extremities. She is alert and oriented x3.  LABORATORY DATA:  Lab Results  Component Value Date   WBC 4.4 04/24/2013   HGB 11.0* 04/24/2013   HCT 33.3* 04/24/2013   MCV 78.6* 04/24/2013   PLT 280 04/24/2013   Lab Results  Component Value Date   NA 142 04/24/2013   K 3.5 04/24/2013   CL 104 04/12/2013   CO2 25 04/24/2013   Lab Results  Component Value Date   ALT 26 04/24/2013   AST 20 04/24/2013   ALKPHOS 119 04/24/2013   BILITOT 0.35 04/24/2013     RADIOGRAPHY:  Nm Sentinel Node Inj-no Rpt (breast)  04/10/2013   CLINICAL DATA: Breast cancer   Sulfur colloid was injected intradermally by the nuclear medicine  technologist for breast cancer sentinel node localization.       IMPRESSION: T3 N2 possibly M1 left invasive ductal carcinoma  PLAN: I spoke with the patient today regarding the role of radiation in decreasing local failures on the chest wall. She does not have documentation of metastatic disease at this point. The only evidence we have of documentation was going on the rest of her body as sarcoid from her liver. It does concern me that all of her hypermetabolic activity didn't get better with chemotherapy which is not consistent with a sarcoid process. If that is the case radiation will not be helpful in terms of extending her survival. She did however have residual disease of a significant quantity in her lymph nodes as well as in the breast. That places her at very high risk for chest wall recurrence regardless of her metastatic disease. We discussed that radiation is a local regional treatment and would just be given to prevent recurrence along the chest on and regional lymph nodes. I don't really see recent this point ago chasing her right-sided supraclavicular nodes or the nodes within the middle of the chest. I think we can treat the left chest wall and regional lymphatics including an axillary boost. We discussed the possible side effects of this treatment including but not limited to skin redness and darkening which can be permanent. We discussed use of breath hold technique for cardiac sparing. We discussed the low likelihood of symptomatic lung and rib damage. We discussed the possibility of increase lymphedema with treatment of a dissected axilla. We discussed complications associated with reconstruction after radiation. I have scheduled her for simulation next week and we will start her the following week. We discussed 33 treatments as an outpatient.  She has signed informed consent and agree to proceed forward. She talk to Dr. Welton Flakes who apparently talk to her about Xeloda concurrent with radiation. I think this is reasonable given the amount of residual disease that she had. Dr. Milta Deiters note is not ready so I will contact her regarding her thoughts on this plan. She will then go on to antiestrogen therapy.  I spent 40 minutes  face to face with the patient and more than 50% of that time  was spent in counseling and/or coordination of care.   ------------------------------------------------  Thea Silversmith, MD

## 2013-05-01 ENCOUNTER — Encounter (HOSPITAL_COMMUNITY): Payer: Medicaid Other

## 2013-05-01 ENCOUNTER — Ambulatory Visit: Payer: Medicaid Other | Attending: General Surgery | Admitting: Physical Therapy

## 2013-05-01 DIAGNOSIS — M24519 Contracture, unspecified shoulder: Secondary | ICD-10-CM | POA: Insufficient documentation

## 2013-05-01 DIAGNOSIS — IMO0001 Reserved for inherently not codable concepts without codable children: Secondary | ICD-10-CM | POA: Insufficient documentation

## 2013-05-01 DIAGNOSIS — I890 Lymphedema, not elsewhere classified: Secondary | ICD-10-CM | POA: Insufficient documentation

## 2013-05-01 DIAGNOSIS — Z9010 Acquired absence of unspecified breast and nipple: Secondary | ICD-10-CM | POA: Insufficient documentation

## 2013-05-01 DIAGNOSIS — C50919 Malignant neoplasm of unspecified site of unspecified female breast: Secondary | ICD-10-CM | POA: Insufficient documentation

## 2013-05-02 ENCOUNTER — Ambulatory Visit
Admission: RE | Admit: 2013-05-02 | Discharge: 2013-05-02 | Disposition: A | Discharge status: Home / Self Care | Payer: Medicaid Other | Source: Ambulatory Visit | Attending: Radiation Oncology | Admitting: Radiation Oncology

## 2013-05-02 DIAGNOSIS — C50919 Malignant neoplasm of unspecified site of unspecified female breast: Secondary | ICD-10-CM | POA: Insufficient documentation

## 2013-05-02 DIAGNOSIS — Z510 Encounter for antineoplastic radiation therapy: Secondary | ICD-10-CM | POA: Insufficient documentation

## 2013-05-02 DIAGNOSIS — Z79899 Other long term (current) drug therapy: Secondary | ICD-10-CM | POA: Insufficient documentation

## 2013-05-02 DIAGNOSIS — C50912 Malignant neoplasm of unspecified site of left female breast: Secondary | ICD-10-CM

## 2013-05-02 NOTE — Progress Notes (Addendum)
Name: Brenda Avery   MRN: [id number]  Date:  05/02/2013  DOB: 1971-06-04  Status:outpatient    DIAGNOSIS: Breast cancer.  CONSENT VERIFIED: yes   SET UP: Patient is setup supine   IMMOBILIZATION:  The following immobilization was used:Custom Moldable Pillow, breast board.   NARRATIVE: Brenda Avery was brought to the CT Simulation planning suite.  Identity was confirmed.  All relevant records and images related to the planned course of therapy were reviewed.  Then, the patient was positioned in a stable reproducible clinical set-up for radiation therapy.  Wires were placed to delineate the clinical extent of breast tissue. A wire was placed on the scar as well.  CT images were obtained.  An isocenter was placed. Skin markings were placed.  The CT images were loaded into the planning software where the target and avoidance structures were contoured.  The radiation prescription was entered and confirmed. The patient was discharged in stable condition and tolerated simulation well.    TREATMENT PLANNING NOTE:  Treatment planning then occurred. I have requested : MLC's, isodose plan, basic dose calculation  I personally designed and supervised the construction of 5 medically necessary complex treatment devices for the protection of critical normal structures including the lungs and contralateral breast as well as the immobilization device which is necessary for set up certainty.   Special treatment procedure was performed due to concurrent chemotherapy. We will carefully monitor for increased side effects of treatment.

## 2013-05-04 ENCOUNTER — Encounter: Payer: Self-pay | Admitting: Radiation Oncology

## 2013-05-04 NOTE — Progress Notes (Signed)
3D conformal treatment planning occurred today.  Due to the proximity of her field to critical structures including the heart and lungs, I requested and analyzed a DVH of this plan.

## 2013-05-05 ENCOUNTER — Telehealth: Payer: Self-pay

## 2013-05-05 NOTE — Telephone Encounter (Signed)
Prescription for xeloda faxed to Brenda Avery [facility] pharmacy on 04/24/13 by Sharion Dove.Co-pay is $3.00.[name] will call patient to pick up as patient to start xeloda on 05/10/13 on start of radiation.I will call to reinforce how to take and what side effects to look out for.

## 2013-05-07 NOTE — Progress Notes (Signed)
OFFICE PROGRESS NOTE  CC  Brenda Overman, MD [address] [place] [address] Dr. Claud Kelp Dr.Stacy Michell Heinrich  DIAGNOSIS: T3 N2 likely M1 left breast cancer status post neoadjuvant chemotherapy with residual disease in the breast and lymph nodes status post left mastectomy and axillary lymph node dissection and right prophylactic mastectomy and lymph node biopsy  HISTORY OF PRESENT ILLNESS::Brenda Avery is a 42 y.o. female who experience shooting pains in the left breast beginning in November. An ultrasound at that time showed 2 separate masses in the left upper outer quadrant. The first mass was noted to be a simple cyst and a second mass was noted measuring 2.8 x 2.1 x 2.6 cm. A biopsy was performed which showed invasive mammary carcinoma and mammary carcinoma in situ. The calcifications were also biopsied in the upper outer quadrant showing DCIS. no abnormal lymph nodes were noted. An MRI of the bilateral breast on 10/10/2012 showed bilateral parenchymal enhancement. A hypervascular mass was noted occupying the entire upper outer and lower outer quadrants measuring 11 x 5.5 x 7.5 cm. Numerous small satellite enhancing lesions were noted the largest measuring 1 cm. Multiple enlarged left axillary lymph nodes were noted with one measuring 2.5 cm in greatest dimension. On the right there were a couple masses which were also noted and concerning. A biopsy was recommended but she declined. The sternum on the MRI scan showed abnormal marrow signal and enhancement suspicious for osseous metastatic disease. A PET scan performed on January 29 showed hypermetabolic activity in the right supraclavicular region as well as hypermetabolic activity in the left subpectoral node with an SUV of 3.8 and uptake within the left upper breast mass. Hypermetabolic activity was noted throughout the left hilar, suprahilar, right hilar and suprahilar adenopathy with SUV's of 8.7 10.0 and 6.2. A  subcarinal lymph node had an SUV of 9.1. Small bilateral pleural effusions were noted but were indeterminate for malignancy. Hypermetabolic activity was noted in the liver and hypermetabolic right iliac and bilateral inguinal nodes were noted with SUVs of 6.7 and 7.8 respectively. Hypermetabolic activity was noted in the sternum with an SUV of 4.9 corresponding to a lytic lesion. 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 She underwent neoadjuvant chemotherapy with Dr. Mariel Sleet. She received Adriamycin Cytoxan and Taxol. A restaging PET/CT was performed on 02/13/2013. This showed marked response to treatment with the right supraclavicular lymph node now with an SUV of 3.3 down from 7.6 and SUV is up 3.6 and 4.4 as well as 4.2 in the mediastinal disease. The mass in the upper outer left breast showed an SUV of 4.3 compared to 6.8 on prior examination. Diffuse liver uptake was noted with several areas showing a decrease in hypermetabolic activity as well. The hypermetabolic activity in the sternum had resolved and no other hypermetabolic bone lesions were identified. She underwent bilateral mastectomies on 04/10/2013. 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 Lymphovascular invasion was identified. Margins were negative. 8/11 lymph nodes were positive with extracapsular extension. There was no evidence of malignancy in the right breast and no evidence of malignancy in 2 sentinel lymph nodes on the right. The tumor was ER positive at 99% PR positive at 14% and HER-2 negative. She saw Dr. Welton Flakes yesterday and was referred to me for consideration of radiation in the management of her disease. She is accompanied by her  mother-in-law. She has been genetically tested and is BRCA1 and 2 negative. She has been referred to physical therapy   CURRENT THERAPY: radiation therapy with  radiosensitizing Xeloda  INTERVAL HISTORY: Brenda Avery 42 y.o. female returns for initial visit with me. Her history of present illness this as above. She is establishing her care with me. Clinically she seems to be doing well without any significant problems.  MEDICAL HISTORY: Past Medical History  Diagnosis Date  . Hyperlipidemia     diet therapy per patient request  . 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 Hematuria 2000/2001    evaluted in [place] with negative results  . Type II diabetes mellitus     diet therapy   . Breast CA     left    ALLERGIES:  is allergic to metronidazole and sulfonamide derivatives.  MEDICATIONS:  Current Outpatient Prescriptions  Medication Sig Dispense Refill  . oxyCODONE-acetaminophen (PERCOCET/ROXICET) 5-325 MG per tablet Take 1-2 tablets by mouth every 4 (four) hours as needed.  30 tablet  0  . potassium chloride SA (K-DUR,KLOR-CON) 20 MEQ tablet Take 1 tablet (20 mEq total) by mouth 2 (two) times daily.  60 tablet  1  . capecitabine (XELODA) 500 MG tablet Take 2 tablets (1,000 mg total) by mouth 2 (two) times daily after a meal.  120 tablet  1   No current facility-administered medications for this visit.    SURGICAL HISTORY:  Past Surgical History  Procedure Laterality Date  . Rectocele repair  2004  . Cystoscopy  2001  . Nevus excision  2004    facial  . Portacath placement  09/20/2012    Procedure: INSERTION PORT-A-CATH;  Surgeon: Ernestene Mention, MD;  Location[facility] OR;  Service: General;  Laterality: N/A;  port a cath insertion with flouro and ultrasound   . Mastectomy Right 04/10/2013  . Mastectomy modified radical Left 04/10/2013  . Axillary sentinel node biopsy Right 04/10/2013  . Liver biopsy    . Breast biopsy Left 2014  . Tubal ligation  2004  . Mastectomy modified radical Left 04/10/2013    Procedure: LEFT MODIFIED RADICAL MASTECTOMY ;  Surgeon: Ernestene Mention, MD;  Location: [facility] OR;  Service: General;   Laterality: Left;  . Simple mastectomy with axillary sentinel node biopsy Right 04/10/2013    Procedure: RIGHT TOTAL  MASTECTOMY WITH AXILLARY SENTINEL NODE BIOPSY;  Surgeon: Ernestene Mention, MD;  Location: [facility] OR;  Service: General;  Laterality: Right;  Methylene Blue Injection   Family History  Problem Relation Age of Onset  . Hypertension Mother   . Diabetes Mother     MI  . Heart disease Mother   . Hypertension Sister     x4 only one HTN and one thats DI  . Hypertension Brother     x4 only 1 htn & dm  . Heart disease Father   . Breast cancer Maternal Aunt     diagnosed in her 4s  . Cancer Maternal Aunt     unknown  . Breast cancer Paternal Aunt     diagnosed in her 51s  . Cervical cancer Sister 50  . Breast cancer Maternal Aunt     diagnosed in her 11s  . Breast cancer Cousin     3 maternal cousins - 2 in their 43s, 1 in her 37s      History   Social History  . Marital Status: Married    Spouse  Name: 2    Number of Children: N/A  . Years of Education: N/A   Occupational History  . Nursing Assistant at [employer]    Social History Main Topics  . Smoking status: Never Smoker   . Smokeless tobacco: Never Used  . Alcohol Use: No  . Drug Use: No  . Sexual Activity: Not Currently    Birth Control/ Protection: Surgical     Comment: menarche age 60, P2   Other Topics Concern  . Not on file   Social History Narrative  . No narrative on file    REVIEW OF SYSTEMS:  Pertinent items are noted in HPI.   HEALTH MAINTENANCE:  PHYSICAL EXAMINATION: Blood pressure 166/108, pulse 92, temperature 98.3 F (36.8 C), temperature source Oral, resp. rate 18, height 5\' 4"  (1.626 m), weight 188 lb (85.276 kg). Body mass index is 32.25 kg/(m^2). ECOG PERFORMANCE STATUS: 0 - Asymptomatic   General appearance: alert, cooperative and appears stated age Lymph nodes: Cervical, supraclavicular, and axillary nodes normal. Resp: clear to auscultation bilaterally Back: symmetric, no  curvature. ROM normal. No CVA tenderness. Cardio: regular rate and rhythm, S1, S2 normal, no murmur, click, rub or gallop GI: soft, non-tender; bowel sounds normal; no masses,  no organomegaly Extremities: extremities normal, atraumatic, no cyanosis or edema Neurologic: Grossly normal   LABORATORY DATA: Lab Results  Component Value Date   WBC 4.4 04/24/2013   HGB 11.0* 04/24/2013   HCT 33.3* 04/24/2013   MCV 78.6* 04/24/2013   PLT 280 04/24/2013      Chemistry      Component Value Date/Time   NA 142 04/24/2013 1222   NA 138 04/12/2013 0715   K 3.5 04/24/2013 1222   K 3.3* 04/12/2013 0715   CL 104 04/12/2013 0715   CO2 25 04/24/2013 1222   CO2 25 04/12/2013 0715   BUN 8.1 04/24/2013 1222   BUN 8 04/12/2013 0715   CREATININE 0.8 04/24/2013 1222   CREATININE 0.69 04/12/2013 0715      Component Value Date/Time   CALCIUM 9.3 04/24/2013 1222   CALCIUM 8.8 04/12/2013 0715   ALKPHOS 119 04/24/2013 1222   ALKPHOS 112 04/04/2013 0927   AST 20 04/24/2013 1222   AST 30 04/04/2013 0927   ALT 26 04/24/2013 1222   ALT 31 04/04/2013 0927   BILITOT 0.35 04/24/2013 1222   BILITOT 0.5 04/04/2013 0927       RADIOGRAPHIC STUDIES:  Nm Sentinel Node Inj-no Rpt (breast)  04/10/2013   CLINICAL DATA: Breast cancer   Sulfur colloid was injected intradermally by the nuclear medicine  technologist for breast cancer sentinel node localization.     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 Patient had a positive PET scan very suspicious for metastatic disease. 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  #2 patient underwent neoadjuvant chemotherapy consisting of 4 cycles of a.c. From 09/26/2012 through 11/07/2012 she then received 12 weeks of paclitaxel.  #3 patient is now status post bilateral mastectomies performed in August 2014 that revealed 5.7 cm residual disease with 8  of 10 lymph nodes positive metastatic disease. Postoperatively patient is doing well.  #4 patient's case was discussed at the multidisciplinary breast conference. It was recommended patient proceed with postmastectomy radiation therapy with radiosensitizing Xeloda.  #5 patient has had BRCA2 testing performed and she was negative for both BRCA1 and BRCA2  gene mutation.   PLAN:   #1 patient will proceed with radiation therapy with radiosensitizing Xeloda. I will get this prescription sent to our financial preauthorization individual.  #2 she will be referred to [place] Dr. Lurline Hare for initial radiation oncology consultation.  #3 I will see her back in a few weeks time for followup.   All questions were answered. The patient knows to call the clinic with any problems, questions or concerns. We can certainly see the patient much sooner if necessary.  I spent 55 minutes counseling the patient face to face. The total time spent in the appointment was 55 minutes.    Drue Second, MD Medical/Oncology [facility] [phone] (beeper) [phone] (Office)

## 2013-05-09 ENCOUNTER — Telehealth: Payer: Self-pay

## 2013-05-09 ENCOUNTER — Ambulatory Visit
Admission: RE | Admit: 2013-05-09 | Discharge: 2013-05-09 | Disposition: A | Discharge status: Home / Self Care | Payer: Medicaid Other | Source: Ambulatory Visit | Attending: Radiation Oncology | Admitting: Radiation Oncology

## 2013-05-09 DIAGNOSIS — C50912 Malignant neoplasm of unspecified site of left female breast: Secondary | ICD-10-CM

## 2013-05-09 NOTE — Telephone Encounter (Signed)
Informed patient to start xeloda tomorrow and to make sure she eats prior to taking.Also told main side effects are nausea and diarrhea.I will perform patient education on tomorrow after treatment.

## 2013-05-09 NOTE — Progress Notes (Signed)
  Radiation Oncology         [phone]) [phone] ________________________________  Name: Brenda Avery MRN: [id number]  Date: 05/09/2013  DOB: Jan 15, 1971  Simulation Verification Note  Status: outpatient  NARRATIVE: The patient was brought to the treatment unit and placed in the planned treatment position. The clinical setup was verified. Then port films were obtained and uploaded to the radiation oncology medical record software.  The treatment beams were carefully compared against the planned radiation fields. The position location and shape of the radiation fields was reviewed. The targeted volume of tissue appears appropriately covered by the radiation beams. Organs at risk appear to be excluded as planned.  Based on my personal review, I approved the simulation verification. I requested a report of the PAB field but the MLCs were approved.The patient's treatment will proceed as planned.  ------------------------------------------------  Lurline Hare, MD

## 2013-05-10 ENCOUNTER — Ambulatory Visit
Admission: RE | Admit: 2013-05-10 | Discharge: 2013-05-10 | Disposition: A | Discharge status: Home / Self Care | Payer: Medicaid Other | Source: Ambulatory Visit | Attending: Radiation Oncology | Admitting: Radiation Oncology

## 2013-05-11 ENCOUNTER — Ambulatory Visit
Admission: RE | Admit: 2013-05-11 | Discharge: 2013-05-11 | Disposition: A | Discharge status: Home / Self Care | Payer: Medicaid Other | Source: Ambulatory Visit | Attending: Radiation Oncology | Admitting: Radiation Oncology

## 2013-05-11 DIAGNOSIS — C50412 Malignant neoplasm of upper-outer quadrant of left female breast: Secondary | ICD-10-CM

## 2013-05-11 MED ORDER — RADIAPLEXRX EX GEL
Freq: Once | CUTANEOUS | Status: AC
  Administered 2013-05-11: 12:00:00 via TOPICAL

## 2013-05-11 MED ORDER — ALRA NON-METALLIC DEODORANT (RAD-ONC)
1.0000 "application " | Freq: Once | TOPICAL | Status: AC
  Administered 2013-05-11: 1 via TOPICAL

## 2013-05-12 ENCOUNTER — Ambulatory Visit
Admission: RE | Admit: 2013-05-12 | Discharge: 2013-05-12 | Disposition: A | Discharge status: Home / Self Care | Payer: Medicaid Other | Source: Ambulatory Visit | Attending: Radiation Oncology | Admitting: Radiation Oncology

## 2013-05-12 DIAGNOSIS — C50412 Malignant neoplasm of upper-outer quadrant of left female breast: Secondary | ICD-10-CM

## 2013-05-12 MED ORDER — ALRA NON-METALLIC DEODORANT (RAD-ONC): 1.0000 "application " | Freq: Once | TOPICAL | Status: DC

## 2013-05-12 MED ORDER — RADIAPLEXRX EX GEL
Freq: Once | CUTANEOUS | Status: AC
  Administered 2013-05-12: 13:00:00 via TOPICAL

## 2013-05-12 NOTE — Progress Notes (Signed)
Barry Dienes here for post sim education.  She was given the Radiation Therapy and You book and discussed the potential side effects of radiation including fatigue and skin changes.  She was also given the skin care handout.  She was given Alra deoderant and Raidaplex gel.  She was instructed to apply the Radiaplex gel to the treatment area twice a day, after treatment and at bedtime.  She was oriented to the clinic and educated on under treat day with Dr. Michell Heinrich on Tuesday's.  She was advised to contact nursing with any questions or concerns.

## 2013-05-15 ENCOUNTER — Ambulatory Visit
Admission: RE | Admit: 2013-05-15 | Discharge: 2013-05-15 | Disposition: A | Discharge status: Home / Self Care | Payer: Medicaid Other | Source: Ambulatory Visit | Attending: Radiation Oncology | Admitting: Radiation Oncology

## 2013-05-16 ENCOUNTER — Encounter: Payer: Self-pay | Admitting: Radiation Oncology

## 2013-05-16 ENCOUNTER — Ambulatory Visit
Admission: RE | Admit: 2013-05-16 | Discharge: 2013-05-16 | Disposition: A | Discharge status: Home / Self Care | Payer: Medicaid Other | Source: Ambulatory Visit | Attending: Radiation Oncology | Admitting: Radiation Oncology

## 2013-05-16 VITALS — BP 129/87 | HR 84 | Temp 98.1°F | Ht 64.0 in | Wt 187.3 lb

## 2013-05-16 DIAGNOSIS — C50412 Malignant neoplasm of upper-outer quadrant of left female breast: Secondary | ICD-10-CM

## 2013-05-16 NOTE — Progress Notes (Signed)
Ms. Spainhower has received 5 fractions to her left chest wall.  She denies any pain nor fatigue presently. Started Xeloda 1 week ago and notices more frequent stools.

## 2013-05-16 NOTE — Progress Notes (Signed)
[facility]    Radiation Oncology [address] [place]     Maryln Gottron, M.D. [place], [place] [address]               Billie Lade, M.D., Ph.D. Phone: [phone]      Molli Hazard A. Kathrynn Running, M.D. Fax: [phone]      Radene Gunning, M.D., Ph.D.         Lurline Hare, M.D.         Grayland Jack, M.D Weekly Treatment Management Note  Name: Brenda Avery     MRN: [id number]        CSN: [id number] Date: 05/16/2013      DOB: [birth year]  CC: [name], MD         Lodema Hong    Status: Outpatient  Diagnosis: Left breast cancer  Current Dose: 9 Gy  Current Fraction: 5  Planned Dose: 45 + Gy  Narrative: Brenda Avery was seen today for weekly treatment management. The chart was checked and port films  were reviewed. She is tolerating the treatments well without side effects this time  Metronidazole and Sulfonamide derivatives  Current Outpatient Prescriptions  Medication Sig Dispense Refill  . capecitabine (XELODA) 500 MG tablet Take 2 tablets (1,000 mg total) by mouth 2 (two) times daily after a meal.  120 tablet  1  . oxyCODONE-acetaminophen (PERCOCET/ROXICET) 5-325 MG per tablet Take 1-2 tablets by mouth every 4 (four) hours as needed.  30 tablet  0  . potassium chloride SA (K-DUR,KLOR-CON) 20 MEQ tablet Take 1 tablet (20 mEq total) by mouth 2 (two) times daily.  60 tablet  1   No current facility-administered medications for this encounter.   Labs:  Lab Results  Component Value Date   WBC 4.4 04/24/2013   HGB 11.0* 04/24/2013   HCT 33.3* 04/24/2013   MCV 78.6* 04/24/2013   PLT 280 04/24/2013   Lab Results  Component Value Date   CREATININE 0.8 04/24/2013   BUN 8.1 04/24/2013   NA 142 04/24/2013   K 3.5 04/24/2013   CL 104 04/12/2013   CO2 25 04/24/2013   Lab Results  Component Value Date   ALT 26 04/24/2013   AST 20 04/24/2013   BILITOT 0.35 04/24/2013    Physical Examination:  vitals were not taken for this visit.   Wt Readings from Last 3 Encounters:   04/26/13 188 lb 8 oz (85.503 kg)  04/25/13 188 lb 6.4 oz (85.458 kg)  04/24/13 188 lb (85.276 kg)    The left chest wall area shows mild hyperpigmentation changes.  Some Steri-Strips remaining from her surgery Lungs - Normal respiratory effort, chest expands symmetrically. Lungs are clear to auscultation, no crackles or wheezes.  Heart has regular rhythm and rate  Abdomen is soft and non tender with normal bowel sounds  Assessment:  Patient tolerating treatments well  Plan: Continue treatment per original radiation prescription

## 2013-05-17 ENCOUNTER — Ambulatory Visit
Admission: RE | Admit: 2013-05-17 | Discharge: 2013-05-17 | Disposition: A | Discharge status: Home / Self Care | Payer: Medicaid Other | Source: Ambulatory Visit | Attending: Radiation Oncology | Admitting: Radiation Oncology

## 2013-05-18 ENCOUNTER — Ambulatory Visit
Admission: RE | Admit: 2013-05-18 | Discharge: 2013-05-18 | Disposition: A | Discharge status: Home / Self Care | Payer: Medicaid Other | Source: Ambulatory Visit | Attending: Radiation Oncology | Admitting: Radiation Oncology

## 2013-05-19 ENCOUNTER — Ambulatory Visit
Admission: RE | Admit: 2013-05-19 | Discharge: 2013-05-19 | Disposition: A | Discharge status: Home / Self Care | Payer: Medicaid Other | Source: Ambulatory Visit | Attending: Radiation Oncology | Admitting: Radiation Oncology

## 2013-05-22 ENCOUNTER — Other Ambulatory Visit: Payer: Self-pay | Admitting: Emergency Medicine

## 2013-05-22 ENCOUNTER — Ambulatory Visit (HOSPITAL_BASED_OUTPATIENT_CLINIC_OR_DEPARTMENT_OTHER): Payer: Medicaid Other | Admitting: Oncology

## 2013-05-22 ENCOUNTER — Encounter: Payer: Self-pay | Admitting: Oncology

## 2013-05-22 ENCOUNTER — Other Ambulatory Visit (HOSPITAL_BASED_OUTPATIENT_CLINIC_OR_DEPARTMENT_OTHER): Payer: Medicaid Other | Admitting: Lab

## 2013-05-22 ENCOUNTER — Ambulatory Visit
Admission: RE | Admit: 2013-05-22 | Discharge: 2013-05-22 | Disposition: A | Discharge status: Home / Self Care | Payer: Medicaid Other | Source: Ambulatory Visit | Attending: Radiation Oncology | Admitting: Radiation Oncology

## 2013-05-22 VITALS — BP 151/93 | HR 87 | Temp 98.5°F | Resp 20 | Ht 64.0 in | Wt 186.9 lb

## 2013-05-22 DIAGNOSIS — C773 Secondary and unspecified malignant neoplasm of axilla and upper limb lymph nodes: Secondary | ICD-10-CM

## 2013-05-22 DIAGNOSIS — C7951 Secondary malignant neoplasm of bone: Secondary | ICD-10-CM

## 2013-05-22 DIAGNOSIS — D869 Sarcoidosis, unspecified: Secondary | ICD-10-CM

## 2013-05-22 DIAGNOSIS — C50412 Malignant neoplasm of upper-outer quadrant of left female breast: Secondary | ICD-10-CM

## 2013-05-22 DIAGNOSIS — E876 Hypokalemia: Secondary | ICD-10-CM

## 2013-05-22 DIAGNOSIS — C50419 Malignant neoplasm of upper-outer quadrant of unspecified female breast: Secondary | ICD-10-CM

## 2013-05-22 DIAGNOSIS — C50919 Malignant neoplasm of unspecified site of unspecified female breast: Secondary | ICD-10-CM

## 2013-05-22 DIAGNOSIS — R197 Diarrhea, unspecified: Secondary | ICD-10-CM

## 2013-05-22 DIAGNOSIS — Z23 Encounter for immunization: Secondary | ICD-10-CM

## 2013-05-22 LAB — CBC WITH DIFFERENTIAL/PLATELET
BASO%: 0.5 % (ref 0.0–2.0)
Eosinophils Absolute: 0.2 10*3/uL (ref 0.0–0.5)
HCT: 37.8 % (ref 34.8–46.6)
LYMPH%: 9.4 % — ABNORMAL LOW (ref 14.0–49.7)
MONO#: 0.3 10*3/uL (ref 0.1–0.9)
NEUT#: 2.3 10*3/uL (ref 1.5–6.5)
NEUT%: 74.3 % (ref 38.4–76.8)
Platelets: 233 10*3/uL (ref 145–400)
WBC: 3.1 10*3/uL — ABNORMAL LOW (ref 3.9–10.3)
lymph#: 0.3 10*3/uL — ABNORMAL LOW (ref 0.9–3.3)

## 2013-05-22 LAB — BASIC METABOLIC PANEL (CC13)
CO2: 27 mEq/L (ref 22–29)
Calcium: 9.6 mg/dL (ref 8.4–10.4)
Chloride: 107 mEq/L (ref 98–109)
Creatinine: 0.8 mg/dL (ref 0.6–1.1)
Glucose: 122 mg/dl (ref 70–140)
Sodium: 143 mEq/L (ref 136–145)

## 2013-05-22 MED ORDER — HEPARIN SOD (PORK) LOCK FLUSH 100 UNIT/ML IV SOLN
500.0000 [IU] | Freq: Once | INTRAVENOUS | Status: AC
  Administered 2013-05-22: 500 [IU] via INTRAVENOUS
  Filled 2013-05-22: qty 5

## 2013-05-22 MED ORDER — SODIUM CHLORIDE 0.9 % IJ SOLN
10.0000 mL | INTRAMUSCULAR | Status: DC | PRN
  Administered 2013-05-22: 10 mL via INTRAVENOUS
  Filled 2013-05-22: qty 10

## 2013-05-22 MED ORDER — INFLUENZA VAC SPLIT QUAD 0.5 ML IM SUSP
0.5000 mL | Freq: Once | INTRAMUSCULAR | Status: AC
  Administered 2013-05-22: 0.5 mL via INTRAMUSCULAR
  Filled 2013-05-22: qty 0.5

## 2013-05-22 MED ORDER — POTASSIUM CHLORIDE CRYS ER 20 MEQ PO TBCR: 20.0000 meq | EXTENDED_RELEASE_TABLET | Freq: Two times a day (BID) | ORAL | Status: DC

## 2013-05-22 NOTE — Telephone Encounter (Signed)
sw pt gv appt for ov on 06/23/13 @ 9:15am, and flush for 07/17/13 @ 11:30am. Pt is aware...td

## 2013-05-22 NOTE — Progress Notes (Signed)
OFFICE PROGRESS NOTE  CC  Brenda Overman, MD [address] [place] [address] Dr. Claud Kelp Dr.Stacy Michell Heinrich  DIAGNOSIS: T3 N2 likely M1 left breast cancer status post neoadjuvant chemotherapy with residual disease in the breast and lymph nodes status post left mastectomy and axillary lymph node dissection and right prophylactic mastectomy and lymph node biopsy  HISTORY OF PRESENT ILLNESS::Brenda Avery is a 42 y.o. female who experience shooting pains in the left breast beginning in November. An ultrasound at that time showed 2 separate masses in the left upper outer quadrant. The first mass was noted to be a simple cyst and a second mass was noted measuring 2.8 x 2.1 x 2.6 cm. A biopsy was performed which showed invasive mammary carcinoma and mammary carcinoma in situ. The calcifications were also biopsied in the upper outer quadrant showing DCIS. no abnormal lymph nodes were noted. An MRI of the bilateral breast on 10/10/2012 showed bilateral parenchymal enhancement. A hypervascular mass was noted occupying the entire upper outer and lower outer quadrants measuring 11 x 5.5 x 7.5 cm. Numerous small satellite enhancing lesions were noted the largest measuring 1 cm. Multiple enlarged left axillary lymph nodes were noted with one measuring 2.5 cm in greatest dimension. On the right there were a couple masses which were also noted and concerning. A biopsy was recommended but she declined. The sternum on the MRI scan showed abnormal marrow signal and enhancement suspicious for osseous metastatic disease. A PET scan performed on January 29 showed hypermetabolic activity in the right supraclavicular region as well as hypermetabolic activity in the left subpectoral node with an SUV of 3.8 and uptake within the left upper breast mass. Hypermetabolic activity was noted throughout the left hilar, suprahilar, right hilar and suprahilar adenopathy with SUV's of 8.7 10.0 and 6.2. A  subcarinal lymph node had an SUV of 9.1. Small bilateral pleural effusions were noted but were indeterminate for malignancy. Hypermetabolic activity was noted in the liver and hypermetabolic right iliac and bilateral inguinal nodes were noted with SUVs of 6.7 and 7.8 respectively. Hypermetabolic activity was noted in the sternum with an SUV of 4.9 corresponding to a lytic lesion. 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 She underwent neoadjuvant chemotherapy with Dr. Mariel Sleet. She received Adriamycin Cytoxan and Taxol. A restaging PET/CT was performed on 02/13/2013. This showed marked response to treatment with the right supraclavicular lymph node now with an SUV of 3.3 down from 7.6 and SUV is up 3.6 and 4.4 as well as 4.2 in the mediastinal disease. The mass in the upper outer left breast showed an SUV of 4.3 compared to 6.8 on prior examination. Diffuse liver uptake was noted with several areas showing a decrease in hypermetabolic activity as well. The hypermetabolic activity in the sternum had resolved and no other hypermetabolic bone lesions were identified. She underwent bilateral mastectomies on 04/10/2013. 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 Lymphovascular invasion was identified. Margins were negative. 8/11 lymph nodes were positive with extracapsular extension. There was no evidence of malignancy in the right breast and no evidence of malignancy in 2 sentinel lymph nodes on the right. The tumor was ER positive at 99% PR positive at 14% and HER-2 negative. She saw Dr. Welton Flakes yesterday and was referred to me for consideration of radiation in the management of her disease. She is accompanied by her  mother-in-law. She has been genetically tested and is BRCA1 and 2 negative. She has been referred to physical therapy   CURRENT THERAPY: radiation therapy with  radiosensitizing Xeloda  INTERVAL HISTORY: Brenda Avery 42 y.o. female returns for followup visit today. Overall she's doing well she denies any headaches double vision blurring of vision fevers chills night sweats. So far she is tolerating radiation quite nicely. She continues to take the Xeloda thousand milligrams in the morning and thousand milligrams at night. She is tolerating it except for mild diarrhea. She has no skin changes no rashes no nail changes. No back pain no aches or pains. Remainder of the 10 point review of systems is negative.   MEDICAL HISTORY: Past Medical History  Diagnosis Date  . Hyperlipidemia     diet therapy per patient request  . 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 Hematuria 2000/2001    evaluted in [place] with negative results  . Type II diabetes mellitus     diet therapy   . Breast CA     left    ALLERGIES:  is allergic to metronidazole and sulfonamide derivatives.  MEDICATIONS:  Current Outpatient Prescriptions  Medication Sig Dispense Refill  . capecitabine (XELODA) 500 MG tablet Take 2 tablets (1,000 mg total) by mouth 2 (two) times daily after a meal.  120 tablet  1  . oxyCODONE-acetaminophen (PERCOCET/ROXICET) 5-325 MG per tablet Take 1-2 tablets by mouth every 4 (four) hours as needed.  30 tablet  0  . potassium chloride SA (K-DUR,KLOR-CON) 20 MEQ tablet Take 1 tablet (20 mEq total) by mouth 2 (two) times daily.  60 tablet  1   Current Facility-Administered Medications  Medication Dose Route Frequency Provider Last Rate Last Dose  . influenza vac split quadrivalent PF (FLUARIX) injection 0.5 mL  0.5 mL Intramuscular Once Victorino December, MD        SURGICAL HISTORY:  Past Surgical History  Procedure Laterality Date  . Rectocele repair  2004  . Cystoscopy  2001  . Nevus excision  2004    facial  . Portacath placement  09/20/2012    Procedure: INSERTION PORT-A-CATH;  Surgeon: Ernestene Mention, MD;  Location: [facility];  Service:  General;  Laterality: N/A;  port a cath insertion with flouro and ultrasound   . Mastectomy Right 04/10/2013  . Mastectomy modified radical Left 04/10/2013  . Axillary sentinel node biopsy Right 04/10/2013  . Liver biopsy    . Breast biopsy Left 2014  . Tubal ligation  2004  . Mastectomy modified radical Left 04/10/2013    Procedure: LEFT MODIFIED RADICAL MASTECTOMY ;  Surgeon: Ernestene Mention, MD;  Location: [facility] OR;  Service: General;  Laterality: Left;  . Simple mastectomy with axillary sentinel node biopsy Right 04/10/2013    Procedure: RIGHT TOTAL  MASTECTOMY WITH AXILLARY SENTINEL NODE BIOPSY;  Surgeon: Ernestene Mention, MD;  Location: [facility] OR;  Service: General;  Laterality: Right;  Methylene Blue Injection   Family History  Problem Relation Age of Onset  . Hypertension Mother   . Diabetes Mother     MI  . Heart disease Mother   . Hypertension Sister     x4 only one HTN and one thats DI  . Hypertension Brother     x4 only 1 htn & dm  . Heart disease Father   . Breast cancer Maternal Aunt     diagnosed in her 37s  . Cancer Maternal  Aunt     unknown  . Breast cancer Paternal Aunt     diagnosed in her 27s  . Cervical cancer Sister 50  . Breast cancer Maternal Aunt     diagnosed in her 5s  . Breast cancer Cousin     3 maternal cousins - 2 in their 12s, 1 in her 23s      History   Social History  . Marital Status: Married    Spouse Name: 2    Number of Children: N/A  . Years of Education: N/A   Occupational History  . Nursing Assistant at [employer]    Social History Main Topics  . Smoking status: Never Smoker   . Smokeless tobacco: Never Used  . Alcohol Use: No  . Drug Use: No  . Sexual Activity: Not Currently    Birth Control/ Protection: Surgical     Comment: menarche age 80, P2   Other Topics Concern  . Not on file   Social History Narrative  . No narrative on file    REVIEW OF SYSTEMS:  Pertinent items are noted in HPI.   HEALTH  MAINTENANCE:  PHYSICAL EXAMINATION: Blood pressure 151/93, pulse 87, temperature 98.5 F (36.9 C), temperature source Oral, resp. rate 20, height 5\' 4"  (1.626 m), weight 186 lb 14.4 oz (84.777 kg). Body mass index is 32.07 kg/(m^2). ECOG PERFORMANCE STATUS: 0 - Asymptomatic   General appearance: alert, cooperative and appears stated age Lymph nodes: Cervical, supraclavicular, and axillary nodes normal. Resp: clear to auscultation bilaterally Back: symmetric, no curvature. ROM normal. No CVA tenderness. Cardio: regular rate and rhythm, S1, S2 normal, no murmur, click, rub or gallop GI: soft, non-tender; bowel sounds normal; no masses,  no organomegaly Extremities: extremities normal, atraumatic, no cyanosis or edema Neurologic: Grossly normal   LABORATORY DATA: Lab Results  Component Value Date   WBC 3.1* 05/22/2013   HGB 12.4 05/22/2013   HCT 37.8 05/22/2013   MCV 77.7* 05/22/2013   PLT 233 05/22/2013      Chemistry      Component Value Date/Time   NA 142 04/24/2013 1222   NA 138 04/12/2013 0715   K 3.5 04/24/2013 1222   K 3.3* 04/12/2013 0715   CL 104 04/12/2013 0715   CO2 25 04/24/2013 1222   CO2 25 04/12/2013 0715   BUN 8.1 04/24/2013 1222   BUN 8 04/12/2013 0715   CREATININE 0.8 04/24/2013 1222   CREATININE 0.69 04/12/2013 0715      Component Value Date/Time   CALCIUM 9.3 04/24/2013 1222   CALCIUM 8.8 04/12/2013 0715   ALKPHOS 119 04/24/2013 1222   ALKPHOS 112 04/04/2013 0927   AST 20 04/24/2013 1222   AST 30 04/04/2013 0927   ALT 26 04/24/2013 1222   ALT 31 04/04/2013 0927   BILITOT 0.35 04/24/2013 1222   BILITOT 0.5 04/04/2013 0927       RADIOGRAPHIC STUDIES:  Nm Sentinel Node Inj-no Rpt (breast)  04/10/2013   CLINICAL DATA: Breast cancer   Sulfur colloid was injected intradermally by the nuclear medicine  technologist for breast cancer sentinel node localization.     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 Patient had a positive PET scan very suspicious for metastatic disease. 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  #2 patient underwent neoadjuvant chemotherapy consisting of 4 cycles of a.c. From 09/26/2012  through 11/07/2012 she then received 12 weeks of paclitaxel.  #3 patient is now status post bilateral mastectomies performed in August 2014 that revealed 5.7 cm residual disease with 8 of 10 lymph nodes positive metastatic disease. Postoperatively patient is doing well.  #4 patient's case was discussed at the multidisciplinary breast conference. It was recommended patient proceed with postmastectomy radiation therapy with radiosensitizing Xeloda.  #5 patient has had BRCA2 testing performed and she was negative for both BRCA1 and BRCA2 gene mutation.  #6 hypokalemia patient will begin taking potassium 2 tablets daily for 5 days.  #7 patient will also receive flu shot today.   PLAN:  #1 continue radiation and Xeloda  #2 I will see the patient back in one month's time for followup  All questions were answered. The patient knows to call the clinic with any problems, questions or concerns. We can certainly see the patient much sooner if necessary.  I spent 20 minutes counseling the patient face to face. The total time spent in the appointment was 25 minutes.    Drue Second, MD Medical/Oncology [facility] [phone] (beeper) [phone] (Office)

## 2013-05-22 NOTE — Patient Instructions (Addendum)
#  1 continue taking Xeloda 1000 mg twice a day with radiation  #2 begin potassium 2 tablets daily for a total of 5 days  #3 I will see you back in one month's time for followup

## 2013-05-23 ENCOUNTER — Ambulatory Visit
Admission: RE | Admit: 2013-05-23 | Discharge: 2013-05-23 | Disposition: A | Discharge status: Home / Self Care | Payer: Medicaid Other | Source: Ambulatory Visit | Attending: Radiation Oncology | Admitting: Radiation Oncology

## 2013-05-23 ENCOUNTER — Encounter (INDEPENDENT_AMBULATORY_CARE_PROVIDER_SITE_OTHER): Payer: Self-pay | Admitting: General Surgery

## 2013-05-23 ENCOUNTER — Ambulatory Visit (INDEPENDENT_AMBULATORY_CARE_PROVIDER_SITE_OTHER): Payer: Medicaid Other | Admitting: General Surgery

## 2013-05-23 VITALS — BP 137/93 | HR 85 | Temp 98.0°F | Ht 64.0 in | Wt 186.3 lb

## 2013-05-23 VITALS — BP 124/78 | HR 72 | Resp 16 | Ht 64.0 in | Wt 186.4 lb

## 2013-05-23 DIAGNOSIS — C800 Disseminated malignant neoplasm, unspecified: Secondary | ICD-10-CM

## 2013-05-23 DIAGNOSIS — C50912 Malignant neoplasm of unspecified site of left female breast: Secondary | ICD-10-CM

## 2013-05-23 DIAGNOSIS — C50919 Malignant neoplasm of unspecified site of unspecified female breast: Secondary | ICD-10-CM

## 2013-05-23 NOTE — Progress Notes (Signed)
Weekly Management Note Current Dose: 18  Gy  Projected Dose:60.4  Gy   Narrative:  The patient presents for routine under treatment assessment.  CBCT/MVCT images/Port film x-rays were reviewed.  The chart was checked. Doing well. No skin changes. Taking xeloda with minima nausea. No symptoms of hand/foot or diarrhea.   Physical Findings: Weight: 186 lb 4.8 oz (84.505 kg). Unchanged  Impression:  The patient is tolerating radiation.  Plan:  Continue treatment as planned. Continue radiaplex.

## 2013-05-23 NOTE — Progress Notes (Addendum)
Brenda Avery here for weekly under treat visit.  She has had 10 fractions to her left chest wall. Her bp today was 137/93.  She says she gets nervous when seeing doctors.  She denies pain.  She has occasional fatigue.  The skin on her left chest and left underarm have hyperpigmentation.  She is using radiaplex gel.  She has occasional nausea when she does not eat enough before taking the Xeloda.

## 2013-05-23 NOTE — Patient Instructions (Signed)
Your bilateral mastectomy wounds have healed very well. There is no evidence of cancer on physical exam.  Contact Dr. Derrell Lolling when Dr. Drue Second tells you that can have the Port-A-Cath removed. We will schedule that at your convenience.  We discussed elective reconstruction, and you said  that you are still interested in that. At your request, we will postpone this referral until February after you have recovered from your radiation therapy.  We will try to get you back in touch with the physical therapist.

## 2013-05-23 NOTE — Progress Notes (Signed)
Patient ID: Brenda Avery, female   DOB: [birth year], 42 y.o.   MRN: [id number]  History:  She returns following postop bilateral mastectomy , left axillary lymph node dissection and right axilla sentinel node biopsy on 04/10/2013. Pathology report shows a tumor on the left side was 5.7 cm. 8 of 11 lymph nodes were positive. Negative margins. No cancer in the right breast. She has recovered from the surgery. She has seen physical therapy once and has pretty good range of motion but still is a little tight. She is currently receiving radiation therapy to the left chest wall and ti on Xeloda as well. Apparently, Dr. Welton Flakes plans eventual antiestrogen therapy. We talked about breast reconstruction and plastic surgical referral. She is still interested in this but wants to postppone until I see her in February. We talked about the Port-A-Cath. She is not sure whether Dr. Welton Flakes is ready to have that removed. She will discuss that with Dr. Welton Flakes and contact me. We discussed that procedure in some detail.  Exam: Good spirits. Husband with her. Neck reveals no adenopathy or mass Bilateral mastectomy wounds are well healed. No nodules or ulceration. No fluid collections. Tissues are soft. A little bit of skin redundancy at the end of each incision. Axillae feels normal. Range of motion of her shoulders are almost 180 but a little tight.   Assessment:  Advanced left-sided breast cancer, infiltrating ductal type, clinical stage III versus stage IV, receptor positive, HER-2/neu negative. Pretreatment clinical stage T3, N1. Pathologic stage ypT3, ypN2a.  Some downstaging from neoadjuvant chemotherapy left breast  Abnormal MRI right breast, right breast cancer suspected but biopsy was declined previously. NED on final pathology  BRCA1 and BRCA2 negative  Biopsy-proven sarcoidosis the liver  Plan: Refer back to physical therapy Diet and activities discussed Continue radiation therapy and Xeloda See me in  February, which will be 6 months postop We'll discuss plastic surgical options and referral at that time.   Angelia Mould. Derrell Lolling, M.D., [facility] General and Minimally invasive Surgery Breast and Colorectal Surgery Office:   [phone] Pager:   [phone]

## 2013-05-24 ENCOUNTER — Ambulatory Visit
Admission: RE | Admit: 2013-05-24 | Discharge: 2013-05-24 | Disposition: A | Discharge status: Home / Self Care | Payer: Medicaid Other | Source: Ambulatory Visit | Attending: Radiation Oncology | Admitting: Radiation Oncology

## 2013-05-25 ENCOUNTER — Ambulatory Visit
Admission: RE | Admit: 2013-05-25 | Discharge: 2013-05-25 | Disposition: A | Discharge status: Home / Self Care | Payer: Medicaid Other | Source: Ambulatory Visit | Attending: Radiation Oncology | Admitting: Radiation Oncology

## 2013-05-26 ENCOUNTER — Ambulatory Visit
Admission: RE | Admit: 2013-05-26 | Discharge: 2013-05-26 | Disposition: A | Discharge status: Home / Self Care | Payer: Medicaid Other | Source: Ambulatory Visit | Attending: Radiation Oncology | Admitting: Radiation Oncology

## 2013-05-29 ENCOUNTER — Ambulatory Visit
Admission: RE | Admit: 2013-05-29 | Discharge: 2013-05-29 | Disposition: A | Discharge status: Home / Self Care | Payer: Medicaid Other | Source: Ambulatory Visit | Attending: Radiation Oncology | Admitting: Radiation Oncology

## 2013-05-30 ENCOUNTER — Ambulatory Visit
Admission: RE | Admit: 2013-05-30 | Discharge: 2013-05-30 | Disposition: A | Discharge status: Home / Self Care | Payer: Medicaid Other | Source: Ambulatory Visit | Attending: Radiation Oncology | Admitting: Radiation Oncology

## 2013-05-30 VITALS — BP 156/103 | HR 90 | Temp 98.2°F | Wt 187.0 lb

## 2013-05-30 DIAGNOSIS — C50412 Malignant neoplasm of upper-outer quadrant of left female breast: Secondary | ICD-10-CM

## 2013-05-30 NOTE — Progress Notes (Signed)
Patient here for weekly assessment of radiation to left chestwall.Mild discoloration.Has some numbness of bilateral axilla, worse on left.Hasn't heard from lyphedema clinic.I will try to call today.No nausea or diarrhea from xeloda.Mild fatigue.

## 2013-05-30 NOTE — Progress Notes (Signed)
Weekly Management Note Current Dose:  27 Gy  Projected Dose: 50.4 Gy   Narrative:  The patient presents for routine under treatment assessment.  CBCT/MVCT images/Port film x-rays were reviewed.  The chart was checked.Doing well. Tolerating xeloda well. No N/V/D. Using radiaplex. Some numbness under her arm. Has not been to PT.   Physical Findings: Weight: 187 lb (84.823 kg). Slightly dark left chest wall. Alert and oriented. No skin changes on the hands.  Impression:  The patient is tolerating radiation.  Plan:  Continue treatment as planned. Continue radiaplex.

## 2013-05-31 ENCOUNTER — Ambulatory Visit
Admission: RE | Admit: 2013-05-31 | Discharge: 2013-05-31 | Disposition: A | Discharge status: Home / Self Care | Payer: Medicaid Other | Source: Ambulatory Visit | Attending: Radiation Oncology | Admitting: Radiation Oncology

## 2013-06-01 ENCOUNTER — Telehealth: Payer: Self-pay

## 2013-06-01 ENCOUNTER — Other Ambulatory Visit (HOSPITAL_COMMUNITY): Payer: Self-pay | Admitting: Oncology

## 2013-06-01 ENCOUNTER — Telehealth: Payer: Self-pay | Admitting: *Deleted

## 2013-06-01 ENCOUNTER — Ambulatory Visit
Admission: RE | Admit: 2013-06-01 | Discharge: 2013-06-01 | Disposition: A | Discharge status: Home / Self Care | Payer: Medicaid Other | Source: Ambulatory Visit | Attending: Radiation Oncology | Admitting: Radiation Oncology

## 2013-06-01 NOTE — Telephone Encounter (Signed)
Received call from pt stating her niece just had a baby. Pt inquiring if it is "okay for her to visit her niece and baby". Pt is receiving radiation treatment to her breast. Informed pt she may be around her niece and baby.

## 2013-06-01 NOTE — Telephone Encounter (Signed)
Called [facility] and spoke with Signe Colt., appointment scheduled for PT for Monday 06/12/13 at 9:30.Informed patient to arrive at 9:15 am to complete paperwork.Gave address [address]

## 2013-06-02 ENCOUNTER — Ambulatory Visit
Admission: RE | Admit: 2013-06-02 | Discharge: 2013-06-02 | Disposition: A | Discharge status: Home / Self Care | Payer: Medicaid Other | Source: Ambulatory Visit | Attending: Radiation Oncology | Admitting: Radiation Oncology

## 2013-06-05 ENCOUNTER — Ambulatory Visit
Admission: RE | Admit: 2013-06-05 | Discharge: 2013-06-05 | Disposition: A | Discharge status: Home / Self Care | Payer: Medicaid Other | Source: Ambulatory Visit | Attending: Radiation Oncology | Admitting: Radiation Oncology

## 2013-06-06 ENCOUNTER — Encounter: Payer: Self-pay | Admitting: Radiation Oncology

## 2013-06-06 ENCOUNTER — Ambulatory Visit
Admission: RE | Admit: 2013-06-06 | Discharge: 2013-06-06 | Disposition: A | Discharge status: Home / Self Care | Payer: Medicaid Other | Source: Ambulatory Visit | Attending: Radiation Oncology | Admitting: Radiation Oncology

## 2013-06-06 VITALS — BP 142/93 | HR 86 | Temp 98.2°F | Ht 64.0 in | Wt 186.0 lb

## 2013-06-06 DIAGNOSIS — C50912 Malignant neoplasm of unspecified site of left female breast: Secondary | ICD-10-CM

## 2013-06-06 NOTE — Progress Notes (Signed)
Brenda Avery has received 20 fractions to her Left chest wall.  Not hyperpigmentation to her left and right chest areas. She reports intermittent pain in her chest wall and reports mild swelling of her left arm to her wrist, but denies any pain.

## 2013-06-06 NOTE — Progress Notes (Signed)
Weekly Management Note Current Dose:  36 Gy  Projected Dose: 61 Gy   Narrative:  The patient presents for routine under treatment assessment.  CBCT/MVCT images/Port film x-rays were reviewed.  The chart was checked. Doing well. No nausea. Some left arm swelling but minimal. Seeing PT next week.   Physical Findings: Weight: 186 lb (84.369 kg). Slightly dark left chest wall.  Impression:  The patient is tolerating radiation.  Plan:  Continue treatment as planned. Continue radiaplex.

## 2013-06-07 ENCOUNTER — Ambulatory Visit
Admission: RE | Admit: 2013-06-07 | Discharge: 2013-06-07 | Disposition: A | Discharge status: Home / Self Care | Payer: Medicaid Other | Source: Ambulatory Visit | Attending: Radiation Oncology | Admitting: Radiation Oncology

## 2013-06-08 ENCOUNTER — Ambulatory Visit
Admission: RE | Admit: 2013-06-08 | Discharge: 2013-06-08 | Disposition: A | Discharge status: Home / Self Care | Payer: Medicaid Other | Source: Ambulatory Visit | Attending: Radiation Oncology | Admitting: Radiation Oncology

## 2013-06-09 ENCOUNTER — Encounter: Payer: Self-pay | Admitting: *Deleted

## 2013-06-09 ENCOUNTER — Ambulatory Visit
Admission: RE | Admit: 2013-06-09 | Discharge: 2013-06-09 | Disposition: A | Discharge status: Home / Self Care | Payer: Medicaid Other | Source: Ambulatory Visit | Attending: Radiation Oncology | Admitting: Radiation Oncology

## 2013-06-09 NOTE — Progress Notes (Signed)
Mailed after appt letter to pt. 

## 2013-06-11 ENCOUNTER — Ambulatory Visit: Payer: Medicaid Other

## 2013-06-12 ENCOUNTER — Ambulatory Visit: Payer: Medicaid Other | Attending: General Surgery | Admitting: Physical Therapy

## 2013-06-12 ENCOUNTER — Ambulatory Visit: Payer: Medicaid Other

## 2013-06-12 ENCOUNTER — Ambulatory Visit
Admission: RE | Admit: 2013-06-12 | Discharge: 2013-06-12 | Disposition: A | Discharge status: Home / Self Care | Payer: Medicaid Other | Source: Ambulatory Visit | Attending: Radiation Oncology | Admitting: Radiation Oncology

## 2013-06-12 DIAGNOSIS — M24519 Contracture, unspecified shoulder: Secondary | ICD-10-CM | POA: Insufficient documentation

## 2013-06-12 DIAGNOSIS — I890 Lymphedema, not elsewhere classified: Secondary | ICD-10-CM | POA: Insufficient documentation

## 2013-06-12 DIAGNOSIS — C50919 Malignant neoplasm of unspecified site of unspecified female breast: Secondary | ICD-10-CM | POA: Insufficient documentation

## 2013-06-12 DIAGNOSIS — IMO0001 Reserved for inherently not codable concepts without codable children: Secondary | ICD-10-CM | POA: Insufficient documentation

## 2013-06-12 DIAGNOSIS — Z9010 Acquired absence of unspecified breast and nipple: Secondary | ICD-10-CM | POA: Insufficient documentation

## 2013-06-13 ENCOUNTER — Ambulatory Visit: Payer: Medicaid Other

## 2013-06-13 ENCOUNTER — Ambulatory Visit
Admission: RE | Admit: 2013-06-13 | Discharge: 2013-06-13 | Disposition: A | Discharge status: Home / Self Care | Payer: Medicaid Other | Source: Ambulatory Visit | Attending: Radiation Oncology | Admitting: Radiation Oncology

## 2013-06-13 DIAGNOSIS — C50912 Malignant neoplasm of unspecified site of left female breast: Secondary | ICD-10-CM

## 2013-06-13 NOTE — Progress Notes (Signed)
Weekly Management Note Current Dose: 45  Gy  Projected Dose: 60.4 Gy   Narrative:  The patient presents for routine under treatment assessment.  CBCT/MVCT images/Port film x-rays were reviewed.  The chart was checked. Doing well. Skin is slightly irritated. Saw on treatment machine for markout.   Physical Findings: Minimal skin redness/darkening.   Impression:  The patient is tolerating radiation.  Plan:  Continue treatment as planned. Proceed on with boost.

## 2013-06-14 ENCOUNTER — Ambulatory Visit: Payer: Medicaid Other

## 2013-06-14 ENCOUNTER — Ambulatory Visit: Admission: RE | Admit: 2013-06-14 | Payer: Medicaid Other | Source: Ambulatory Visit

## 2013-06-14 ENCOUNTER — Ambulatory Visit
Admission: RE | Admit: 2013-06-14 | Discharge: 2013-06-14 | Disposition: A | Discharge status: Home / Self Care | Payer: Medicaid Other | Source: Ambulatory Visit | Attending: Radiation Oncology | Admitting: Radiation Oncology

## 2013-06-15 ENCOUNTER — Ambulatory Visit
Admission: RE | Admit: 2013-06-15 | Discharge: 2013-06-15 | Disposition: A | Discharge status: Home / Self Care | Payer: Medicaid Other | Source: Ambulatory Visit | Attending: Radiation Oncology | Admitting: Radiation Oncology

## 2013-06-15 ENCOUNTER — Ambulatory Visit: Admission: RE | Admit: 2013-06-15 | Payer: Medicaid Other | Source: Ambulatory Visit

## 2013-06-15 ENCOUNTER — Ambulatory Visit: Payer: Medicaid Other

## 2013-06-16 ENCOUNTER — Ambulatory Visit: Payer: Medicaid Other

## 2013-06-16 ENCOUNTER — Encounter: Payer: Self-pay | Admitting: Radiation Oncology

## 2013-06-16 ENCOUNTER — Ambulatory Visit
Admission: RE | Admit: 2013-06-16 | Discharge: 2013-06-16 | Disposition: A | Discharge status: Home / Self Care | Payer: Medicaid Other | Source: Ambulatory Visit | Attending: Radiation Oncology | Admitting: Radiation Oncology

## 2013-06-16 NOTE — Progress Notes (Signed)
Name: Brenda Avery   MRN: [id number]  Date:  06/16/2013   DOB: 1971-02-05  Status:outpatient    DIAGNOSIS: Breast cancer.  CONSENT VERIFIED: yes   SET UP: Patient is setup supine   IMMOBILIZATION:  The following immobilization was used:Custom Moldable Pillow, breast board.   NARRATIVE: Brenda Avery underwent complex simulation and treatment planning for her boost treatment today. Her scar will be treated with a margin to the depth of her pectoralis muscle. 6 MeV electrons will be prescribed to the 90% isodose line and 0.5 cm of bolus will be placed daily.  2 fields were used to ensure enface direction of the beams.     I personally supervised and approved the construction of 2 complex treatment devices in the form of 2 blocks will be used for beam modification purposes.  A special port plan is requested.

## 2013-06-19 ENCOUNTER — Ambulatory Visit
Admission: RE | Admit: 2013-06-19 | Discharge: 2013-06-19 | Disposition: A | Discharge status: Home / Self Care | Payer: Medicaid Other | Source: Ambulatory Visit | Attending: Radiation Oncology | Admitting: Radiation Oncology

## 2013-06-19 ENCOUNTER — Ambulatory Visit: Payer: Medicaid Other | Attending: General Surgery

## 2013-06-19 DIAGNOSIS — C50919 Malignant neoplasm of unspecified site of unspecified female breast: Secondary | ICD-10-CM | POA: Insufficient documentation

## 2013-06-19 DIAGNOSIS — M24519 Contracture, unspecified shoulder: Secondary | ICD-10-CM | POA: Insufficient documentation

## 2013-06-19 DIAGNOSIS — IMO0001 Reserved for inherently not codable concepts without codable children: Secondary | ICD-10-CM | POA: Insufficient documentation

## 2013-06-19 DIAGNOSIS — I890 Lymphedema, not elsewhere classified: Secondary | ICD-10-CM | POA: Insufficient documentation

## 2013-06-19 DIAGNOSIS — Z9010 Acquired absence of unspecified breast and nipple: Secondary | ICD-10-CM | POA: Insufficient documentation

## 2013-06-20 ENCOUNTER — Ambulatory Visit
Admission: RE | Admit: 2013-06-20 | Discharge: 2013-06-20 | Disposition: A | Discharge status: Home / Self Care | Payer: Medicaid Other | Source: Ambulatory Visit | Attending: Radiation Oncology | Admitting: Radiation Oncology

## 2013-06-20 VITALS — BP 139/78 | HR 100 | Temp 98.5°F | Wt 185.1 lb

## 2013-06-20 DIAGNOSIS — C50412 Malignant neoplasm of upper-outer quadrant of left female breast: Secondary | ICD-10-CM

## 2013-06-20 MED ORDER — RADIAPLEXRX EX GEL
Freq: Once | CUTANEOUS | Status: AC
  Administered 2013-06-20: 13:00:00 via TOPICAL

## 2013-06-20 NOTE — Progress Notes (Signed)
Patient here for weekly assessment of radiation to right chest wall.Skin with marked discoloration of axilla and tenderness.Patient will complete treatment on Friday.Will give additional tube of radiaplex.Mild fatigue.No side effects of xeloda except fatigue.

## 2013-06-20 NOTE — Progress Notes (Signed)
Weekly Management Note Current Dose:  54.4 Gy  Projected Dose:60.4  Gy   Narrative:  The patient presents for routine under treatment assessment.  CBCT/MVCT images/Port film x-rays were reviewed.  The chart was checked. Doing well. More skin irritation over the weekend. No skin is dark. Has more Xeloda from Dr. Welton Flakes.   Physical Findings: Weight: 185 lb 1.6 oz (83.961 kg). Dry dermatitis over right chest wall. No breakdown  Impression:  The patient is tolerating radiation.  Plan:  Continue treatment as planned. Reassess Friday. Continue radiaplex.

## 2013-06-21 ENCOUNTER — Ambulatory Visit
Admission: RE | Admit: 2013-06-21 | Discharge: 2013-06-21 | Disposition: A | Discharge status: Home / Self Care | Payer: Medicaid Other | Source: Ambulatory Visit | Attending: Radiation Oncology | Admitting: Radiation Oncology

## 2013-06-22 ENCOUNTER — Ambulatory Visit
Admission: RE | Admit: 2013-06-22 | Discharge: 2013-06-22 | Disposition: A | Discharge status: Home / Self Care | Payer: Medicaid Other | Source: Ambulatory Visit | Attending: Radiation Oncology | Admitting: Radiation Oncology

## 2013-06-23 ENCOUNTER — Encounter: Payer: Self-pay | Admitting: Radiation Oncology

## 2013-06-23 ENCOUNTER — Telehealth: Payer: Self-pay | Admitting: Oncology

## 2013-06-23 ENCOUNTER — Ambulatory Visit (HOSPITAL_BASED_OUTPATIENT_CLINIC_OR_DEPARTMENT_OTHER): Payer: Medicaid Other | Admitting: Oncology

## 2013-06-23 ENCOUNTER — Ambulatory Visit
Admission: RE | Admit: 2013-06-23 | Discharge: 2013-06-23 | Disposition: A | Discharge status: Home / Self Care | Payer: Medicaid Other | Source: Ambulatory Visit | Attending: Radiation Oncology | Admitting: Radiation Oncology

## 2013-06-23 ENCOUNTER — Encounter: Payer: Self-pay | Admitting: Oncology

## 2013-06-23 VITALS — BP 137/84 | HR 96 | Temp 98.0°F | Resp 20 | Ht 64.0 in | Wt 186.8 lb

## 2013-06-23 DIAGNOSIS — Z171 Estrogen receptor negative status [ER-]: Secondary | ICD-10-CM

## 2013-06-23 DIAGNOSIS — C773 Secondary and unspecified malignant neoplasm of axilla and upper limb lymph nodes: Secondary | ICD-10-CM

## 2013-06-23 DIAGNOSIS — C50912 Malignant neoplasm of unspecified site of left female breast: Secondary | ICD-10-CM

## 2013-06-23 DIAGNOSIS — D869 Sarcoidosis, unspecified: Secondary | ICD-10-CM

## 2013-06-23 DIAGNOSIS — C50419 Malignant neoplasm of upper-outer quadrant of unspecified female breast: Secondary | ICD-10-CM

## 2013-06-23 MED ORDER — TAMOXIFEN CITRATE 20 MG PO TABS: 20.0000 mg | ORAL_TABLET | Freq: Every day | ORAL | Status: DC

## 2013-06-23 NOTE — Progress Notes (Signed)
OFFICE PROGRESS NOTE  CC  Brenda Nakayama, MD [address], [address] [place] [address] Dr. Fanny Skates Dr.Stacy Pablo Ledger  DIAGNOSIS: T3 N2 likely M1 left breast cancer status post neoadjuvant chemotherapy with residual disease in the breast and lymph nodes status post left mastectomy and axillary lymph node dissection and right prophylactic mastectomy and lymph node biopsy  Prior therapy  :Brenda Avery is a 42 y.o. female who  #1 experience shooting pains in the left breast beginning in November. An ultrasound at that time showed 2 separate masses in the left upper outer quadrant. The first mass was noted to be a simple cyst and a second mass was noted measuring 2.8 x 2.1 x 2.6 cm. A biopsy was performed which showed invasive mammary carcinoma and mammary carcinoma in situ. The calcifications were also biopsied in the upper outer quadrant showing DCIS. no abnormal lymph nodes were noted. An MRI of the bilateral breast on 10/10/2012 showed bilateral parenchymal enhancement. A hypervascular mass was noted occupying the entire upper outer and lower outer quadrants measuring 11 x 5.5 x 7.5 cm. Numerous small satellite enhancing lesions were noted the largest measuring 1 cm. Multiple enlarged left axillary lymph nodes were noted with one measuring 2.5 cm in greatest dimension. On the right there were a couple masses which were also noted and concerning. A biopsy was recommended but she declined. The sternum on the MRI scan showed abnormal marrow signal and enhancement suspicious for osseous metastatic disease. A PET scan performed on January 29 showed hypermetabolic activity in the right supraclavicular region as well as hypermetabolic activity in the left subpectoral node with an SUV of 3.8 and uptake within the left upper breast mass. Hypermetabolic activity was noted throughout the left hilar, suprahilar, right hilar and suprahilar adenopathy with SUV's of 8.7 10.0 and 6.2. A subcarinal  lymph node had an SUV of 9.1. Small bilateral pleural effusions were noted but were indeterminate for malignancy. Hypermetabolic activity was noted in the liver and hypermetabolic right iliac and bilateral inguinal nodes were noted with SUVs of 6.7 and 7.8 respectively. Hypermetabolic activity was noted in the sternum with an SUV of 4.9 corresponding to a lytic lesion. 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 She underwent neoadjuvant chemotherapy with Dr. Tressie Stalker. She received Adriamycin Cytoxan and Taxol. A restaging PET/CT was performed on 02/13/2013. This showed marked response to treatment with the right supraclavicular lymph node now with an SUV of 3.3 down from 7.6 and SUV is up 3.6 and 4.4 as well as 4.2 in the mediastinal disease. The mass in the upper outer left breast showed an SUV of 4.3 compared to 6.8 on prior examination. Diffuse liver uptake was noted with several areas showing a decrease in hypermetabolic activity as well. The hypermetabolic activity in the sternum had resolved and no other hypermetabolic bone lesions were identified. She underwent bilateral mastectomies on 04/10/2013. 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 Lymphovascular invasion was identified. Margins were negative. 8/11 lymph nodes were positive with extracapsular extension. There was no evidence of malignancy in the right breast and no evidence of malignancy in 2 sentinel lymph nodes on the right. The tumor was ER positive at 99% PR positive at 14% and HER-2 negative. She saw Dr. Humphrey Rolls yesterday and was referred to me for consideration of radiation in the management of her disease. She is accompanied  by her mother-in-law. She has been genetically tested and is BRCA1 and 2 negative. She has been referred to physical therapy  #2 status post adjuvant radiation therapy with radiosensitizing  Xeloda. Completed 06/23/2013.  #3 adjuvant tamoxifen 20 mg daily beginning 06/23/2013  #4 Zoladex every 3 monthly   CURRENT THERAPY: Curative intent adjuvant tamoxifen 20 mg daily beginning 06/23/2013  INTERVAL HISTORY: Brenda Avery 42 y.o. female returns for followup visit today. Overall she's doing well she denies any headaches double vision blurring of vision fevers chills night sweats. Patient will be finishing up radiation therapy today. She continues to lymphedema treatments. She denies any fevers chills night sweats headaches shortness of breath chest pains palpitations no myalgias and arthralgias no peripheral paresthesias. She has not developed any rashes or mucositis secondary to the Xeloda. She is experiencing some hot flashes. She has not had a menstrual cycle for about 4-5 months. Remainder of the 10 point review of systems is negative.  MEDICAL HISTORY: Past Medical History  Diagnosis Date  . Hyperlipidemia     diet therapy per patient request  . 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 Hematuria 2000/2001    evaluted in [place] with negative results  . Type II diabetes mellitus     diet therapy   . Breast CA     left    ALLERGIES:  is allergic to metronidazole and sulfonamide derivatives.  MEDICATIONS:  Current Outpatient Prescriptions  Medication Sig Dispense Refill  . capecitabine (XELODA) 500 MG tablet Take 2 tablets (1,000 mg total) by mouth 2 (two) times daily after a meal.  120 tablet  1  . hyaluronate sodium (RADIAPLEXRX) GEL Apply 1 application topically 2 (two) times daily.      . non-metallic deodorant Thornton Papas) MISC Apply topically daily as needed.      . potassium chloride SA (K-DUR,KLOR-CON) 20 MEQ tablet Take 1 tablet (20 mEq total) by mouth 2 (two) times daily.  60 tablet  1   No current facility-administered medications for this visit.    SURGICAL HISTORY:  Past Surgical History  Procedure Laterality Date  . Rectocele repair  2004  .  Cystoscopy  2001  . Nevus excision  2004    facial  . Portacath placement  09/20/2012    Procedure: INSERTION PORT-A-CATH;  Surgeon: Ernestene Mention, MD;  Location: [facility] OR;  Service: General;  Laterality: N/A;  port a cath insertion with flouro and ultrasound   . Mastectomy Right 04/10/2013  . Mastectomy modified radical Left 04/10/2013  . Axillary sentinel node biopsy Right 04/10/2013  . Liver biopsy    . Breast biopsy Left 2014  . Tubal ligation  2004  . Mastectomy modified radical Left 04/10/2013    Procedure: LEFT MODIFIED RADICAL MASTECTOMY ;  Surgeon: Ernestene Mention, MD;  Location: [facility] OR;  Service: General;  Laterality: Left;  . Simple mastectomy with axillary sentinel node biopsy Right 04/10/2013    Procedure: RIGHT TOTAL  MASTECTOMY WITH AXILLARY SENTINEL NODE BIOPSY;  Surgeon: Ernestene Mention, MD;  Location: [facility] OR;  Service: General;  Laterality: Right;  Methylene Blue Injection   Family History  Problem Relation Age of Onset  . Hypertension Mother   . Diabetes Mother     MI  . Heart disease Mother   . Hypertension Sister     x4 only one HTN and one thats DI  . Hypertension Brother     x4 only 1 htn &  dm  . Heart disease Father   . Breast cancer Maternal Aunt     diagnosed in her 44s  . Cancer Maternal Aunt     unknown  . Breast cancer Paternal Aunt     diagnosed in her 71s  . Cervical cancer Sister 29  . Breast cancer Maternal Aunt     diagnosed in her 35s  . Breast cancer Cousin     3 maternal cousins - 2 in their 78s, 1 in her 23s      History   Social History  . Marital Status: Married    Spouse Name: 2    Number of Children: N/A  . Years of Education: N/A   Occupational History  . Nursing Assistant at [employer]    Social History Main Topics  . Smoking status: Never Smoker   . Smokeless tobacco: Never Used  . Alcohol Use: No  . Drug Use: No  . Sexual Activity: Not Currently    Birth Control/ Protection: Surgical     Comment: menarche age 2, P2    Other Topics Concern  . Not on file   Social History Narrative  . No narrative on file    REVIEW OF SYSTEMS:  Pertinent items are noted in HPI.   HEALTH MAINTENANCE:  PHYSICAL EXAMINATION: Blood pressure 137/84, pulse 96, temperature 98 F (36.7 C), temperature source Oral, resp. rate 20, height 5\' 4"  (1.626 m), weight 186 lb 12.8 oz (84.732 kg). Body mass index is 32.05 kg/(m^2). ECOG PERFORMANCE STATUS: 0 - Asymptomatic   General appearance: alert, cooperative and appears stated age Lymph nodes: Cervical, supraclavicular, and axillary nodes normal. Resp: clear to auscultation bilaterally Back: symmetric, no curvature. ROM normal. No CVA tenderness. Cardio: regular rate and rhythm, S1, S2 normal, no murmur, click, rub or gallop GI: soft, non-tender; bowel sounds normal; no masses,  no organomegaly Extremities: extremities normal, atraumatic, no cyanosis or edema Neurologic: Grossly normal   LABORATORY DATA: Lab Results  Component Value Date   WBC 3.1* 05/22/2013   HGB 12.4 05/22/2013   HCT 37.8 05/22/2013   MCV 77.7* 05/22/2013   PLT 233 05/22/2013      Chemistry      Component Value Date/Time   NA 143 05/22/2013 0905   NA 138 04/12/2013 0715   K 3.2* 05/22/2013 0905   K 3.3* 04/12/2013 0715   CL 104 04/12/2013 0715   CO2 27 05/22/2013 0905   CO2 25 04/12/2013 0715   BUN 7.4 05/22/2013 0905   BUN 8 04/12/2013 0715   CREATININE 0.8 05/22/2013 0905   CREATININE 0.69 04/12/2013 0715      Component Value Date/Time   CALCIUM 9.6 05/22/2013 0905   CALCIUM 8.8 04/12/2013 0715   ALKPHOS 119 04/24/2013 1222   ALKPHOS 112 04/04/2013 0927   AST 20 04/24/2013 1222   AST 30 04/04/2013 0927   ALT 26 04/24/2013 1222   ALT 31 04/04/2013 0927   BILITOT 0.35 04/24/2013 1222   BILITOT 0.5 04/04/2013 0927       RADIOGRAPHIC STUDIES:  Nm Sentinel Node Inj-no Rpt (breast)  04/10/2013   CLINICAL DATA: Breast cancer   Sulfur colloid was injected intradermally by the nuclear medicine  technologist  for breast cancer sentinel node localization.     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 Patient had a positive PET scan very  suspicious for metastatic disease. 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  #2 patient underwent neoadjuvant chemotherapy consisting of 4 cycles of a.c. From 09/26/2012 through 11/07/2012 she then received 12 weeks of paclitaxel.  #3 patient is  status post bilateral mastectomies performed in August 2014 that revealed 5.7 cm residual disease with 8 of 10 lymph nodes positive metastatic disease.   #4 patient is completing adjuvant radiation therapy with radiosensitizing Xeloda today 06/23/2013.  #5 patient has had BRCA2 testing performed and she was negative for both BRCA1 and BRCA2 gene mutation.  #6 she will begin antiestrogen therapy with tamoxifen 20 mg daily risks benefits and side effects were discussed with her today. We also discussed today the possibility of eventually getting her on Aromasin. She however will need Zoladex injections. I have gone ahead and schedule these for her to begin now every 3 months. We will eventually need to check her hormone levels to see whether or not she is postmenopausal. Once she becomes postmenopausal we will switch the tamoxifen to Aromasin.   PLAN:  #1 finish her radiation therapy today.  #2 patient will begin tamoxifen 20 mg daily starting on Monday, 06/26/2013.  #3 patient will begin Zoladex injections every 3 months starting today 06/23/2013.  #4 she will have Port-A-Cath flushes.  #5 I will see the patient back in 3 months time for followup.  All questions were answered. The patient knows to call the clinic with any problems, questions or concerns. We can certainly see the patient much sooner if necessary.  I spent 20 minutes counseling the patient face to  face. The total time spent in the appointment was 25 minutes.    Drue Second, MD Medical/Oncology [facility] [phone] (beeper) [phone] (Office)

## 2013-06-23 NOTE — Patient Instructions (Signed)
#1 you have completed your radiation therapy.  #2 we will begin Zoladex injections every 3 months.  #3 begin tamoxifen 20 mg daily. Risks benefits and side effects were discussed. More information is as below.  #4 you will also need port flushes every 2 months.  #5 I will see you back in 3 months time for followup  Tamoxifen oral tablet What is this medicine? TAMOXIFEN (ta MOX i fen) blocks the effects of estrogen. It is commonly used to treat breast cancer. It is also used to decrease the chance of breast cancer coming back in women who have received treatment for the disease. It may also help prevent breast cancer in women who have a high risk of developing breast cancer. This medicine may be used for other purposes; ask your health care provider or pharmacist if you have questions. COMMON BRAND NAME(S): Nolvadex What should I tell my health care provider before I take this medicine? They need to know if you have any of these conditions: -blood clots -blood disease -cataracts or impaired eyesight -endometriosis -high calcium levels -high cholesterol -irregular menstrual cycles -liver disease -stroke -uterine fibroids -an unusual or allergic reaction to tamoxifen, other medicines, foods, dyes, or preservatives -pregnant or trying to get pregnant -breast-feeding How should I use this medicine? Take this medicine by mouth with a glass of water. Follow the directions on the prescription label. You can take it with or without food. Take your medicine at regular intervals. Do not take your medicine more often than directed. Do not stop taking except on your doctor's advice. A special MedGuide will be given to you by the pharmacist with each prescription and refill. Be sure to read this information carefully each time. Talk to your pediatrician regarding the use of this medicine in children. While this drug may be prescribed for selected conditions, precautions do apply. Overdosage: If  you think you have taken too much of this medicine contact a poison control center or emergency room at once. NOTE: This medicine is only for you. Do not share this medicine with others. What if I miss a dose? If you miss a dose, take it as soon as you can. If it is almost time for your next dose, take only that dose. Do not take double or extra doses. What may interact with this medicine? -aminoglutethimide -bromocriptine -chemotherapy drugs -female hormones, like estrogens and birth control pills -letrozole -medroxyprogesterone -phenobarbital -rifampin -warfarin This list may not describe all possible interactions. Give your health care provider a list of all the medicines, herbs, non-prescription drugs, or dietary supplements you use. Also tell them if you smoke, drink alcohol, or use illegal drugs. Some items may interact with your medicine. What should I watch for while using this medicine? Visit your doctor or health care professional for regular checks on your progress. You will need regular pelvic exams, breast exams, and mammograms. If you are taking this medicine to reduce your risk of getting breast cancer, you should know that this medicine does not prevent all types of breast cancer. If breast cancer or other problems occur, there is no guarantee that it will be found at an early stage. Do not become pregnant while taking this medicine or for 2 months after stopping this medicine. Stop taking this medicine if you get pregnant or think you are pregnant and contact your doctor. This medicine may harm your unborn baby. Women who can possibly become pregnant should use birth control methods that do not use hormones during  tamoxifen treatment and for 2 months after therapy has stopped. Talk with your health care provider for birth control advice. Do not breast feed while taking this medicine. What side effects may I notice from receiving this medicine? Side effects that you should report  to your doctor or health care professional as soon as possible: -changes in vision (blurred vision) -changes in your menstrual cycle -difficulty breathing or shortness of breath -difficulty walking or talking -new breast lumps -numbness -pelvic pain or pressure -redness, blistering, peeling or loosening of the skin, including inside the mouth -skin rash or itching (hives) -sudden chest pain -swelling of lips, face, or tongue -swelling, pain or tenderness in your calf or leg -unusual bruising or bleeding -vaginal discharge that is bloody, brown, or rust -weakness -yellowing of the whites of the eyes or skin Side effects that usually do not require medical attention (report to your doctor or health care professional if they continue or are bothersome): -fatigue -hair loss, although uncommon and is usually mild -headache -hot flashes -impotence (in men) -nausea, vomiting (mild) -vaginal discharge (white or clear) This list may not describe all possible side effects. Call your doctor for medical advice about side effects. You may report side effects to FDA at 1-800-FDA-1088. Where should I keep my medicine? Keep out of the reach of children. Store at room temperature between 20 and 25 degrees C (68 and 77 degrees F). Protect from light. Keep container tightly closed. Throw away any unused medicine after the expiration date. NOTE: This sheet is a summary. It may not cover all possible information. If you have questions about this medicine, talk to your doctor, pharmacist, or health care provider.  2014, Elsevier/Gold Standard. (2008-04-19 12:01:56)

## 2013-06-23 NOTE — Telephone Encounter (Signed)
, °

## 2013-06-23 NOTE — Progress Notes (Signed)
Weekly Management Note Current Dose: 60.4  Gy  Projected Dose: 60.4 Gy   Narrative:  The patient presents for routine under treatment assessment.  CBCT/MVCT images/Port film x-rays were reviewed.  The chart was checked. Skin changes continue. Saw Dr. Welton Flakes today. Using radiaplex. Some discomfort in her axilla.  Physical Findings: Dry desquamation in the left axilla. Chest wall has radiation darkness.   Impression:  Finishes today.   Plan:  Skin care discussed. Follow up in 1 month.

## 2013-06-26 ENCOUNTER — Ambulatory Visit (HOSPITAL_BASED_OUTPATIENT_CLINIC_OR_DEPARTMENT_OTHER): Payer: Medicaid Other

## 2013-06-26 ENCOUNTER — Other Ambulatory Visit: Payer: Self-pay | Admitting: Emergency Medicine

## 2013-06-26 ENCOUNTER — Ambulatory Visit: Payer: Medicaid Other

## 2013-06-26 VITALS — BP 140/86 | HR 92 | Temp 98.5°F | Resp 18

## 2013-06-26 DIAGNOSIS — C50912 Malignant neoplasm of unspecified site of left female breast: Secondary | ICD-10-CM

## 2013-06-26 DIAGNOSIS — C50412 Malignant neoplasm of upper-outer quadrant of left female breast: Secondary | ICD-10-CM

## 2013-06-26 DIAGNOSIS — C50419 Malignant neoplasm of upper-outer quadrant of unspecified female breast: Secondary | ICD-10-CM

## 2013-06-26 DIAGNOSIS — Z5111 Encounter for antineoplastic chemotherapy: Secondary | ICD-10-CM

## 2013-06-26 DIAGNOSIS — C773 Secondary and unspecified malignant neoplasm of axilla and upper limb lymph nodes: Secondary | ICD-10-CM

## 2013-06-26 MED ORDER — GOSERELIN ACETATE 10.8 MG ~~LOC~~ IMPL
10.8000 mg | DRUG_IMPLANT | SUBCUTANEOUS | Status: DC
  Administered 2013-06-26: 10.8 mg via SUBCUTANEOUS
  Filled 2013-06-26: qty 10.8

## 2013-07-04 NOTE — Progress Notes (Signed)
  Radiation Oncology         [phone] ________________________________  Name: Brenda Avery MRN: [id number]  Date: 06/23/2013  DOB: 1971/07/27  End of Treatment Note  Diagnosis:   T3N2 possible M1 left breast cancer s/p mastectomy     Indication for treatment:  Curative       Radiation treatment dates:  05/10/2013-06/23/2013  Site/Dose: Left chest wall / 50.4 Gray @ 1.8 Wallace Cullens per fraction x 28 fractions Left Supraclavicular fossa / 45 Gray @1 .8 Gray per fraction x 25 fractions Left PAB / 45 Gy at 1.8 Gray per fraction x 25 fractions Left scar / 10 Gray at [employer] per fraction x 5 fractions  Beams/energy:   Opposed Tangents / 6 MV photons Right anterior oblique / 10 MV photons Left posterior oblige/ 6 MV photons. En face / 6 MeV electrons  Narrative: The patient tolerated radiation treatment relatively well.   She took concurrent zeloda during treatment and only had some small dry desquamation at the end of treatment.   Plan: The patient has completed radiation treatment. The patient will return to radiation oncology clinic for routine followup in one month. I advised them to call or return sooner if they have any questions or concerns related to their recovery or treatment.  ------------------------------------------------  Lurline Hare, MD

## 2013-07-21 ENCOUNTER — Encounter: Payer: Self-pay | Admitting: Radiation Oncology

## 2013-07-27 ENCOUNTER — Ambulatory Visit: Admission: RE | Admit: 2013-07-27 | Payer: Medicaid Other | Source: Ambulatory Visit | Admitting: Radiation Oncology

## 2013-07-27 ENCOUNTER — Other Ambulatory Visit (INDEPENDENT_AMBULATORY_CARE_PROVIDER_SITE_OTHER): Payer: Self-pay | Admitting: *Deleted

## 2013-07-27 HISTORY — DX: Personal history of antineoplastic chemotherapy: Z92.21

## 2013-07-27 HISTORY — DX: Long term (current) use of selective estrogen receptor modulators (serms): Z79.810

## 2013-07-27 HISTORY — DX: Personal history of irradiation: Z92.3

## 2013-07-27 MED ORDER — UNABLE TO FIND: Status: DC

## 2013-07-28 ENCOUNTER — Ambulatory Visit: Payer: Medicaid Other | Admitting: Radiation Oncology

## 2013-07-28 ENCOUNTER — Encounter: Payer: Self-pay | Admitting: *Deleted

## 2013-08-14 ENCOUNTER — Ambulatory Visit (HOSPITAL_BASED_OUTPATIENT_CLINIC_OR_DEPARTMENT_OTHER): Payer: Medicaid Other

## 2013-08-14 VITALS — BP 151/88 | HR 96 | Temp 97.4°F

## 2013-08-14 DIAGNOSIS — Z452 Encounter for adjustment and management of vascular access device: Secondary | ICD-10-CM

## 2013-08-14 DIAGNOSIS — C50419 Malignant neoplasm of upper-outer quadrant of unspecified female breast: Secondary | ICD-10-CM

## 2013-08-14 DIAGNOSIS — Z95828 Presence of other vascular implants and grafts: Secondary | ICD-10-CM

## 2013-08-14 MED ORDER — SODIUM CHLORIDE 0.9 % IJ SOLN
10.0000 mL | INTRAMUSCULAR | Status: DC | PRN
  Administered 2013-08-14: 10 mL via INTRAVENOUS
  Filled 2013-08-14: qty 10

## 2013-08-14 MED ORDER — HEPARIN SOD (PORK) LOCK FLUSH 100 UNIT/ML IV SOLN
500.0000 [IU] | Freq: Once | INTRAVENOUS | Status: AC
  Administered 2013-08-14: 500 [IU] via INTRAVENOUS
  Filled 2013-08-14: qty 5

## 2013-08-14 NOTE — Patient Instructions (Signed)
Implanted Port Instructions  An implanted port is a central line that has a round shape and is placed under the skin. It is used for long-term IV (intravenous) access for:  · Medicine.  · Fluids.  · Liquid nutrition, such as TPN (total parenteral nutrition).  · Blood samples.  Ports can be placed:  · In the chest area just below the collarbone (this is the most common place.)  · In the arms.  · In the belly (abdomen) area.  · In the legs.  PARTS OF THE PORT  A port has 2 main parts:  · The reservoir. The reservoir is round, disc-shaped, and will be a small, raised area under your skin.  · The reservoir is the part where a needle is inserted (accessed) to either give medicines or to draw blood.  · The catheter. The catheter is a long, slender tube that extends from the reservoir. The catheter is placed into a large vein.  · Medicine that is inserted into the reservoir goes into the catheter and then into the vein.  INSERTION OF THE PORT  · The port is surgically placed in either an operating room or in a procedural area (interventional radiology).  · Medicine may be given to help you relax during the procedure.  · The skin where the port will be inserted is numbed (local anesthetic).  · 1 or 2 small cuts (incisions) will be made in the skin to insert the port.  · The port can be used after it has been inserted.  INCISION SITE CARE  · The incision site may have small adhesive strips on it. This helps keep the incision site closed. Sometimes, no adhesive strips are placed. Instead of adhesive strips, a special kind of surgical glue is used to keep the incision closed.  · If adhesive strips were placed on the incision sites, do not take them off. They will fall off on their own.  · The incision site may be sore for 1 to 2 days. Pain medicine can help.  · Do not get the incision site wet. Bathe or shower as directed by your caregiver.  · The incision site should heal in 5 to 7 days. A small scar may form after the  incision has healed.  ACCESSING THE PORT  Special steps must be taken to access the port:  · Before the port is accessed, a numbing cream can be placed on the skin. This helps numb the skin over the port site.  · A sterile technique is used to access the port.  · The port is accessed with a needle. Only "non-coring" port needles should be used to access the port. Once the port is accessed, a blood return should be checked. This helps ensure the port is in the vein and is not clogged (clotted).  · If your caregiver believes your port should remain accessed, a clear (transparent) bandage will be placed over the needle site. The bandage and needle will need to be changed every week or as directed by your caregiver.  · Keep the bandage covering the needle clean and dry. Do not get it wet. Follow your caregiver's instructions on how to take a shower or bath when the port is accessed.  · If your port does not need to stay accessed, no bandage is needed over the port.  FLUSHING THE PORT  Flushing the port keeps it from getting clogged. How often the port is flushed depends on:  · If a   constant infusion is running. If a constant infusion is running, the port may not need to be flushed.  · If intermittent medicines are given.  · If the port is not being used.  For intermittent medicines:  · The port will need to be flushed:  · After medicines have been given.  · After blood has been drawn.  · As part of routine maintenance.  · A port is normally flushed with:  · Normal saline.  · Heparin.  · Follow your caregiver's advice on how often, how much, and the type of flush to use on your port.  IMPORTANT PORT INFORMATION  · Tell your caregiver if you are allergic to heparin.  · After your port is placed, you will get a manufacturer's information card. The card has information about your port. Keep this card with you at all times.  · There are many types of ports available. Know what kind of port you have.  · In case of an  emergency, it may be helpful to wear a medical alert bracelet. This can help alert health care workers that you have a port.  · The port can stay in for as long as your caregiver believes it is necessary.  · When it is time for the port to come out, surgery will be done to remove it. The surgery will be similar to how the port was put in.  · If you are in the hospital or clinic:  · Your port will be taken care of and flushed by a nurse.  · If you are at home:  · A home health care nurse may give medicines and take care of the port.  · You or a family member can get special training and directions for giving medicine and taking care of the port at home.  SEEK IMMEDIATE MEDICAL CARE IF:   · Your port does not flush or you are unable to get a blood return.  · New drainage or pus is coming from the incision.  · A bad smell is coming from the incision site.  · You develop swelling or increased redness at the incision site.  · You develop increased swelling or pain at the port site.  · You develop swelling or pain in the surrounding skin near the port.  · You have an oral temperature above 102° F (38.9° C), not controlled by medicine.  MAKE SURE YOU:   · Understand these instructions.  · Will watch your condition.  · Will get help right away if you are not doing well or get worse.  Document Released: 08/03/2005 Document Revised: 10/26/2011 Document Reviewed: 10/25/2008  Exit[employer]® Patient Information ©2014 [employer].

## 2013-08-24 ENCOUNTER — Encounter: Payer: Self-pay | Admitting: Nurse Practitioner

## 2013-08-24 NOTE — Progress Notes (Signed)
BCCCP application completed and faxed to [facility]. [phone]. Original to Med records for scanning.

## 2013-08-25 ENCOUNTER — Ambulatory Visit: Admission: RE | Admit: 2013-08-25 | Payer: Medicaid Other | Source: Ambulatory Visit | Admitting: Radiation Oncology

## 2013-09-04 ENCOUNTER — Encounter (INDEPENDENT_AMBULATORY_CARE_PROVIDER_SITE_OTHER): Payer: Self-pay | Admitting: General Surgery

## 2013-09-22 ENCOUNTER — Other Ambulatory Visit: Payer: Medicaid Other | Admitting: Lab

## 2013-09-22 ENCOUNTER — Ambulatory Visit: Payer: Medicaid Other | Admitting: Oncology

## 2013-09-25 ENCOUNTER — Other Ambulatory Visit: Payer: Self-pay | Admitting: Adult Health

## 2013-09-25 ENCOUNTER — Telehealth: Payer: Self-pay | Admitting: Oncology

## 2013-09-25 ENCOUNTER — Encounter: Payer: Self-pay | Admitting: Oncology

## 2013-09-25 ENCOUNTER — Ambulatory Visit (HOSPITAL_BASED_OUTPATIENT_CLINIC_OR_DEPARTMENT_OTHER): Payer: Medicaid Other

## 2013-09-25 ENCOUNTER — Ambulatory Visit (HOSPITAL_BASED_OUTPATIENT_CLINIC_OR_DEPARTMENT_OTHER): Payer: Medicaid Other | Admitting: Oncology

## 2013-09-25 ENCOUNTER — Other Ambulatory Visit (HOSPITAL_BASED_OUTPATIENT_CLINIC_OR_DEPARTMENT_OTHER): Payer: Medicaid Other

## 2013-09-25 ENCOUNTER — Ambulatory Visit: Payer: Medicaid Other

## 2013-09-25 VITALS — BP 154/93 | HR 84 | Temp 98.7°F | Resp 20 | Ht 64.0 in | Wt 187.9 lb

## 2013-09-25 DIAGNOSIS — C773 Secondary and unspecified malignant neoplasm of axilla and upper limb lymph nodes: Secondary | ICD-10-CM

## 2013-09-25 DIAGNOSIS — C50419 Malignant neoplasm of upper-outer quadrant of unspecified female breast: Secondary | ICD-10-CM

## 2013-09-25 DIAGNOSIS — D869 Sarcoidosis, unspecified: Secondary | ICD-10-CM

## 2013-09-25 DIAGNOSIS — C50912 Malignant neoplasm of unspecified site of left female breast: Secondary | ICD-10-CM

## 2013-09-25 DIAGNOSIS — Z5111 Encounter for antineoplastic chemotherapy: Secondary | ICD-10-CM

## 2013-09-25 DIAGNOSIS — Z170 Estrogen receptor positive status [ER+]: Secondary | ICD-10-CM

## 2013-09-25 DIAGNOSIS — C50919 Malignant neoplasm of unspecified site of unspecified female breast: Secondary | ICD-10-CM

## 2013-09-25 DIAGNOSIS — E876 Hypokalemia: Secondary | ICD-10-CM

## 2013-09-25 DIAGNOSIS — Z95828 Presence of other vascular implants and grafts: Secondary | ICD-10-CM

## 2013-09-25 DIAGNOSIS — C50412 Malignant neoplasm of upper-outer quadrant of left female breast: Secondary | ICD-10-CM

## 2013-09-25 LAB — CBC WITH DIFFERENTIAL/PLATELET
BASO%: 0.4 % (ref 0.0–2.0)
Basophils Absolute: 0 10*3/uL (ref 0.0–0.1)
EOS ABS: 0.2 10*3/uL (ref 0.0–0.5)
EOS%: 6.3 % (ref 0.0–7.0)
HCT: 34.1 % — ABNORMAL LOW (ref 34.8–46.6)
HGB: 11.2 g/dL — ABNORMAL LOW (ref 11.6–15.9)
LYMPH%: 23 % (ref 14.0–49.7)
MCH: 26.2 pg (ref 25.1–34.0)
MCHC: 32.8 g/dL (ref 31.5–36.0)
MCV: 79.7 fL (ref 79.5–101.0)
MONO#: 0.3 10*3/uL (ref 0.1–0.9)
MONO%: 10.5 % (ref 0.0–14.0)
NEUT%: 59.8 % (ref 38.4–76.8)
NEUTROS ABS: 1.4 10*3/uL — AB (ref 1.5–6.5)
PLATELETS: 182 10*3/uL (ref 145–400)
RBC: 4.28 10*6/uL (ref 3.70–5.45)
RDW: 13.7 % (ref 11.2–14.5)
WBC: 2.4 10*3/uL — AB (ref 3.9–10.3)
lymph#: 0.6 10*3/uL — ABNORMAL LOW (ref 0.9–3.3)

## 2013-09-25 LAB — COMPREHENSIVE METABOLIC PANEL (CC13)
ALBUMIN: 3.4 g/dL — AB (ref 3.5–5.0)
ALT: 22 U/L (ref 0–55)
ANION GAP: 8 meq/L (ref 3–11)
AST: 23 U/L (ref 5–34)
Alkaline Phosphatase: 110 U/L (ref 40–150)
BUN: 9.9 mg/dL (ref 7.0–26.0)
CALCIUM: 9.6 mg/dL (ref 8.4–10.4)
CHLORIDE: 106 meq/L (ref 98–109)
CO2: 28 meq/L (ref 22–29)
Creatinine: 0.7 mg/dL (ref 0.6–1.1)
GLUCOSE: 108 mg/dL (ref 70–140)
POTASSIUM: 3.1 meq/L — AB (ref 3.5–5.1)
SODIUM: 142 meq/L (ref 136–145)
TOTAL PROTEIN: 7.1 g/dL (ref 6.4–8.3)
Total Bilirubin: 0.46 mg/dL (ref 0.20–1.20)

## 2013-09-25 MED ORDER — POTASSIUM CHLORIDE CRYS ER 20 MEQ PO TBCR: 20.0000 meq | EXTENDED_RELEASE_TABLET | Freq: Two times a day (BID) | ORAL | Status: DC

## 2013-09-25 MED ORDER — SODIUM CHLORIDE 0.9 % IJ SOLN
10.0000 mL | INTRAMUSCULAR | Status: DC | PRN
  Administered 2013-09-25: 10 mL via INTRAVENOUS
  Filled 2013-09-25: qty 10

## 2013-09-25 MED ORDER — GOSERELIN ACETATE 10.8 MG ~~LOC~~ IMPL
10.8000 mg | DRUG_IMPLANT | SUBCUTANEOUS | Status: DC
  Administered 2013-09-25: 10.8 mg via SUBCUTANEOUS
  Filled 2013-09-25: qty 10.8

## 2013-09-25 MED ORDER — HEPARIN SOD (PORK) LOCK FLUSH 100 UNIT/ML IV SOLN
500.0000 [IU] | Freq: Once | INTRAVENOUS | Status: AC
  Administered 2013-09-25: 500 [IU] via INTRAVENOUS
  Filled 2013-09-25: qty 5

## 2013-09-25 NOTE — Telephone Encounter (Signed)
, °

## 2013-09-25 NOTE — Progress Notes (Signed)
OFFICE PROGRESS NOTE  CC  Brenda Nakayama, MD [address], [address] [place] [address] Dr. Fanny Skates Dr.Stacy Pablo Ledger  DIAGNOSIS: T3 N2 likely M1 left breast cancer status post neoadjuvant chemotherapy with residual disease in the breast and lymph nodes status post left mastectomy and axillary lymph node dissection and right prophylactic mastectomy and lymph node biopsy  Prior therapy  :Brenda Avery is a 43 y.o. female who  #1 experience shooting pains in the left breast beginning in November. An ultrasound at that time showed 2 separate masses in the left upper outer quadrant. The first mass was noted to be a simple cyst and a second mass was noted measuring 2.8 x 2.1 x 2.6 cm. A biopsy was performed which showed invasive mammary carcinoma and mammary carcinoma in situ. The calcifications were also biopsied in the upper outer quadrant showing DCIS. no abnormal lymph nodes were noted. An MRI of the bilateral breast on 10/10/2012 showed bilateral parenchymal enhancement. A hypervascular mass was noted occupying the entire upper outer and lower outer quadrants measuring 11 x 5.5 x 7.5 cm. Numerous small satellite enhancing lesions were noted the largest measuring 1 cm. Multiple enlarged left axillary lymph nodes were noted with one measuring 2.5 cm in greatest dimension. On the right there were a couple masses which were also noted and concerning. A biopsy was recommended but she declined. The sternum on the MRI scan showed abnormal marrow signal and enhancement suspicious for osseous metastatic disease. A PET scan performed on January 29 showed hypermetabolic activity in the right supraclavicular region as well as hypermetabolic activity in the left subpectoral node with an SUV of 3.8 and uptake within the left upper breast mass. Hypermetabolic activity was noted throughout the left hilar, suprahilar, right hilar and suprahilar adenopathy with SUV's of 8.7 10.0 and 6.2. A subcarinal  lymph node had an SUV of 9.1. Small bilateral pleural effusions were noted but were indeterminate for malignancy. Hypermetabolic activity was noted in the liver and hypermetabolic right iliac and bilateral inguinal nodes were noted with SUVs of 6.7 and 7.8 respectively. Hypermetabolic activity was noted in the sternum with an SUV of 4.9 corresponding to a lytic lesion. 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 She underwent neoadjuvant chemotherapy with Dr. Tressie Stalker. She received Adriamycin Cytoxan and Taxol. A restaging PET/CT was performed on 02/13/2013. This showed marked response to treatment with the right supraclavicular lymph node now with an SUV of 3.3 down from 7.6 and SUV is up 3.6 and 4.4 as well as 4.2 in the mediastinal disease. The mass in the upper outer left breast showed an SUV of 4.3 compared to 6.8 on prior examination. Diffuse liver uptake was noted with several areas showing a decrease in hypermetabolic activity as well. The hypermetabolic activity in the sternum had resolved and no other hypermetabolic bone lesions were identified. She underwent bilateral mastectomies on 04/10/2013. 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 Lymphovascular invasion was identified. Margins were negative. 8/11 lymph nodes were positive with extracapsular extension. There was no evidence of malignancy in the right breast and no evidence of malignancy in 2 sentinel lymph nodes on the right. The tumor was ER positive at 99% PR positive at 14% and HER-2 negative. She saw Dr. Humphrey Rolls yesterday and was referred to me for consideration of radiation in the management of her disease. She is accompanied  by her mother-in-law. She has been genetically tested and is BRCA1 and 2 negative. She has been referred to physical therapy  #2 status post adjuvant radiation therapy with radiosensitizing  Xeloda. Completed 06/23/2013.  #3 adjuvant tamoxifen 20 mg daily beginning 06/23/2013  #4 Zoladex every 3 monthly   CURRENT THERAPY: Curative intent adjuvant tamoxifen 20 mg daily beginning 06/23/2013  INTERVAL HISTORY: Brenda Avery 43 y.o. female returns for followup visit today. Overall she's doing well she denies any headaches double vision blurring of vision fevers chills night sweats. Patient will be finishing up radiation therapy today. She continues to lymphedema treatments. She denies any fevers chills night sweats headaches shortness of breath chest pains palpitations no myalgias and arthralgias no peripheral paresthesias. She has not developed any rashes or mucositis secondary to the Xeloda. She is experiencing some hot flashes.  Remainder of the 10 point review of systems is negative.  MEDICAL HISTORY: Past Medical History  Diagnosis Date  . Hyperlipidemia     diet therapy per patient request  . 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 Hematuria 2000/2001    evaluted in [place] with negative results  . Type II diabetes mellitus     diet therapy   . Breast CA     left  . S/P radiation therapy  05/10/2013-06/23/2013    Left chest wall / 50.4 Gray @ 1.8 Gray per fraction x 28 fractions/ Left Supraclavicular fossa / 45 Gray @1 .8 Gray per fraction x 25 fractions/ Left PAB / 45 Gy at 1.8 Gray per fraction x 25 fractions/Left scar / 10 Gray at [employer] per fraction x 5 fractions     . Status post chemotherapy      Radiation Therapy Radiosensitizing Xeloda  . Status post chemotherapy  09/26/2012 - 11/07/2012      Neoadjuvant chemotherapy consisting of 4 cycles of a.c. From 09/26/2012 through 11/07/2012 she then received 12 weeks of paclitaxel.  . Use of tamoxifen (Nolvadex) started 06/26/13    ALLERGIES:  is allergic to metronidazole and sulfonamide derivatives.  MEDICATIONS:  Current Outpatient Prescriptions  Medication Sig Dispense Refill  . hyaluronate sodium  (RADIAPLEXRX) GEL Apply 1 application topically 2 (two) times daily.      . non-metallic deodorant Jethro Poling) MISC Apply topically daily as needed.      . potassium chloride SA (K-DUR,KLOR-CON) 20 MEQ tablet Take 1 tablet (20 mEq total) by mouth 2 (two) times daily.  60 tablet  1  . tamoxifen (NOLVADEX) 20 MG tablet Take 1 tablet (20 mg total) by mouth daily.  90 tablet  12  . UNABLE TO FIND Rx: O9629- Silicone Breast Prosthesis, Bilateral (Quantity: 2) L8000- Mastectomy Bra (Quantity: 6) Dx: 174.9; Bilateral Mastectomy  1 each  0  . capecitabine (XELODA) 500 MG tablet Take 2 tablets (1,000 mg total) by mouth 2 (two) times daily after a meal.  120 tablet  1   No current facility-administered medications for this visit.    SURGICAL HISTORY:  Past Surgical History  Procedure Laterality Date  . Rectocele repair  2004  . Cystoscopy  2001  . Nevus excision  2004    facial  . Portacath placement  09/20/2012    Procedure: INSERTION PORT-A-CATH;  Surgeon: Adin Hector, MD;  Location: [facility];  Service: General;  Laterality: N/A;  port a cath insertion with flouro and ultrasound   . Mastectomy Right 04/10/2013  . Mastectomy modified radical Left 04/10/2013  . Axillary  sentinel node biopsy Right 04/10/2013  . Liver biopsy    . Breast biopsy Left 2014  . Tubal ligation  2004  . Mastectomy modified radical Left 04/10/2013    Procedure: LEFT MODIFIED RADICAL MASTECTOMY ;  Surgeon: Adin Hector, MD;  Location: [facility];  Service: General;  Laterality: Left;  . Simple mastectomy with axillary sentinel node biopsy Right 04/10/2013    Procedure: RIGHT TOTAL  MASTECTOMY WITH AXILLARY SENTINEL NODE BIOPSY;  Surgeon: Adin Hector, MD;  Location: [facility];  Service: General;  Laterality: Right;  Methylene Blue Injection   Family History  Problem Relation Age of Onset  . Hypertension Mother   . Diabetes Mother     MI  . Heart disease Mother   . Hypertension Sister     x4 only one HTN and one thats DI  .  Hypertension Brother     x4 only 1 htn & dm  . Heart disease Father   . Breast cancer Maternal Aunt     diagnosed in her 59s  . Cancer Maternal Aunt     unknown  . Breast cancer Paternal Aunt     diagnosed in her 95s  . Cervical cancer Sister 18  . Breast cancer Maternal Aunt     diagnosed in her 11s  . Breast cancer Cousin     3 maternal cousins - 2 in their 1s, 1 in her 15s      History   Social History  . Marital Status: Married    Spouse Name: 2    Number of Children: N/A  . Years of Education: N/A   Occupational History  . Nursing Assistant at [employer]  . Smoking status: Never Smoker   . Smokeless tobacco: Never Used  . Alcohol Use: No  . Drug Use: No  . Sexual Activity: Not Currently    Birth Control/ Protection: Surgical     Comment: menarche age 17, P48   Other Topics Concern  . Not on file   Social History Narrative  . No narrative on file    REVIEW OF SYSTEMS:  Pertinent items are noted in HPI.   HEALTH MAINTENANCE:  PHYSICAL EXAMINATION: Blood pressure 154/93, pulse 84, temperature 98.7 F (37.1 C), temperature source Oral, resp. rate 20, height 5' 4"  (1.626 m), weight 187 lb 14.4 oz (85.231 kg). Body mass index is 32.24 kg/(m^2). ECOG PERFORMANCE STATUS: 0 - Asymptomatic   General appearance: alert, cooperative and appears stated age Lymph nodes: Cervical, supraclavicular, and axillary nodes normal. Resp: clear to auscultation bilaterally Back: symmetric, no curvature. ROM normal. No CVA tenderness. Cardio: regular rate and rhythm, S1, S2 normal, no murmur, click, rub or gallop GI: soft, non-tender; bowel sounds normal; no masses,  no organomegaly Extremities: extremities normal, atraumatic, no cyanosis or edema Neurologic: Grossly normal   LABORATORY DATA: Lab Results  Component Value Date   WBC 2.4* 09/25/2013   HGB 11.2* 09/25/2013   HCT 34.1* 09/25/2013   MCV 79.7 09/25/2013   PLT 182 09/25/2013       Chemistry      Component Value Date/Time   NA 142 09/25/2013 1328   NA 138 04/12/2013 0715   K 3.1* 09/25/2013 1328   K 3.3* 04/12/2013 0715   CL 104 04/12/2013 0715   CO2 28 09/25/2013 1328   CO2 25 04/12/2013 0715   BUN 9.9 09/25/2013 1328   BUN 8 04/12/2013 0715   CREATININE 0.7 09/25/2013  1328   CREATININE 0.69 04/12/2013 0715      Component Value Date/Time   CALCIUM 9.6 09/25/2013 1328   CALCIUM 8.8 04/12/2013 0715   ALKPHOS 110 09/25/2013 1328   ALKPHOS 112 04/04/2013 0927   AST 23 09/25/2013 1328   AST 30 04/04/2013 0927   ALT 22 09/25/2013 1328   ALT 31 04/04/2013 0927   BILITOT 0.46 09/25/2013 1328   BILITOT 0.5 04/04/2013 0927       RADIOGRAPHIC STUDIES:  Nm Sentinel Node Inj-no Rpt (breast)  04/10/2013   CLINICAL DATA: Breast cancer   Sulfur colloid was injected intradermally by the nuclear medicine  technologist for breast cancer sentinel node localization.     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 Patient had a positive PET scan very suspicious for metastatic disease. 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  #2 patient underwent neoadjuvant chemotherapy consisting of 4 cycles of a.c. From 09/26/2012 through 11/07/2012 she then received 12 weeks of paclitaxel.  #3 patient is  status post bilateral mastectomies performed in August 2014 that revealed 5.7 cm residual disease with 8 of 10 lymph nodes positive metastatic disease.   #4 patient is completing adjuvant radiation therapy with radiosensitizing Xeloda today 06/23/2013.  #5 patient has had BRCA2 testing performed and she was negative for both BRCA1 and BRCA2 gene mutation.  #6 currently receiving antiestrogen therapy with tamoxifen 20 mg daily risks benefits and side effects were discussed with her today. We also discussed today the possibility of eventually getting her on  Aromasin. She however will need Zoladex injections. I have gone ahead and schedule these for her to begin now every 3 months.  #7 Patient will be seen by in 3 months time for followup. She will receive Zoladex injection.   All questions were answered. The patient knows to call the clinic with any problems, questions or concerns. We can certainly see the patient much sooner if necessary.  I spent 20 minutes counseling the patient face to face. The total time spent in the appointment was 25 minutes.    Marcy Panning, MD Medical/Oncology [facility] [phone] (beeper) [phone] (Office)

## 2013-09-25 NOTE — Patient Instructions (Addendum)
Goserelin injection What is this medicine? GOSERELIN (GOE se rel in) is similar to a hormone found in the body. It lowers the amount of sex hormones that the body makes. Men will have lower testosterone levels and women will have lower estrogen levels while taking this medicine. In men, this medicine is used to treat prostate cancer; the injection is either given once per month or once every 12 weeks. A once per month injection (only) is used to treat women with endometriosis, dysfunctional uterine bleeding, or advanced breast cancer. This medicine may be used for other purposes; ask your health care provider or pharmacist if you have questions. COMMON BRAND NAME(S): Zoladex What should I tell my health care provider before I take this medicine? They need to know if you have any of these conditions (some only apply to women): -diabetes -heart disease or previous heart attack -high blood pressure -high cholesterol -kidney disease -osteoporosis or low bone density -problems passing urine -spinal cord injury -stroke -tobacco smoker -an unusual or allergic reaction to goserelin, hormone therapy, other medicines, foods, dyes, or preservatives -pregnant or trying to get pregnant -breast-feeding How should I use this medicine? This medicine is for injection under the skin. It is given by a health care professional in a hospital or clinic setting. Men receive this injection once every 4 weeks or once every 12 weeks. Women will only receive the once every 4 weeks injection. Talk to your pediatrician regarding the use of this medicine in children. Special care may be needed. Overdosage: If you think you have taken too much of this medicine contact a poison control center or emergency room at once. NOTE: This medicine is only for you. Do not share this medicine with others. What if I miss a dose? It is important not to miss your dose. Call your doctor or health care professional if you are unable to  keep an appointment. What may interact with this medicine? -female hormones like estrogen -herbal or dietary supplements like black cohosh, chasteberry, or DHEA -female hormones like testosterone -prasterone This list may not describe all possible interactions. Give your health care provider a list of all the medicines, herbs, non-prescription drugs, or dietary supplements you use. Also tell them if you smoke, drink alcohol, or use illegal drugs. Some items may interact with your medicine. What should I watch for while using this medicine? Visit your doctor or health care professional for regular checks on your progress. Your symptoms may appear to get worse during the first weeks of this therapy. Tell your doctor or healthcare professional if your symptoms do not start to get better or if they get worse after this time. Your bones may get weaker if you take this medicine for a long time. If you smoke or frequently drink alcohol you may increase your risk of bone loss. A family history of osteoporosis, chronic use of drugs for seizures (convulsions), or corticosteroids can also increase your risk of bone loss. Talk to your doctor about how to keep your bones strong. This medicine should stop regular monthly menstration in women. Tell your doctor if you continue to menstrate. Women should not become pregnant while taking this medicine or for 12 weeks after stopping this medicine. Women should inform their doctor if they wish to become pregnant or think they might be pregnant. There is a potential for serious side effects to an unborn child. Talk to your health care professional or pharmacist for more information. Do not breast-feed an infant while taking   this medicine. Men should inform their doctors if they wish to father a child. This medicine may lower sperm counts. Talk to your health care professional or pharmacist for more information. What side effects may I notice from receiving this  medicine? Side effects that you should report to your doctor or health care professional as soon as possible: -allergic reactions like skin rash, itching or hives, swelling of the face, lips, or tongue -bone pain -breathing problems -changes in vision -chest pain -feeling faint or lightheaded, falls -fever, chills -pain, swelling, warmth in the leg -pain, tingling, numbness in the hands or feet -swelling of the ankles, feet, hands -trouble passing urine or change in the amount of urine -unusually high or low blood pressure -unusually weak or tired Side effects that usually do not require medical attention (report to your doctor or health care professional if they continue or are bothersome): -change in sex drive or performance -changes in breast size in both males and females -changes in emotions or moods -headache -hot flashes -irritation at site where injected -loss of appetite -skin problems like acne, dry skin -vaginal dryness This list may not describe all possible side effects. Call your doctor for medical advice about side effects. You may report side effects to FDA at 1-800-FDA-1088. Where should I keep my medicine? This drug is given in a hospital or clinic and will not be stored at home. NOTE: This sheet is a summary. It may not cover all possible information. If you have questions about this medicine, talk to your doctor, pharmacist, or health care provider.  2014, [employer]vier/Gold Standard. (2008-12-18 13:28:29)  Implanted [facility] Guide An implanted port is a type of central line that is placed under the skin. Central lines are used to provide IV access when treatment or nutrition needs to be given through a person's veins. Implanted ports are used for long-term IV access. An implanted port may be placed because:   You need IV medicine that would be irritating to the small veins in your hands or arms.   You need long-term IV medicines, such as antibiotics.   You  need IV nutrition for a long period.   You need frequent blood draws for lab tests.   You need dialysis.  Implanted ports are usually placed in the chest area, but they can also be placed in the upper arm, the abdomen, or the leg. An implanted port has two main parts:   Reservoir. The reservoir is round and will appear as a small, raised area under your skin. The reservoir is the part where a needle is inserted to give medicines or draw blood.   Catheter. The catheter is a thin, flexible tube that extends from the reservoir. The catheter is placed into a large vein. Medicine that is inserted into the reservoir goes into the catheter and then into the vein.  HOW WILL I CARE FOR MY INCISION SITE? Do not get the incision site wet. Bathe or shower as directed by your health care provider.  HOW IS MY PORT ACCESSED? Special steps must be taken to access the port:   Before the port is accessed, a numbing cream can be placed on the skin. This helps numb the skin over the port site.   Your health care provider uses a sterile technique to access the port.  Your health care provider must put on a mask and sterile gloves.  The skin over your port is cleaned carefully with an antiseptic and allowed to dry.  The port is gently pinched between sterile gloves, and a needle is inserted into the port.  Only "non-coring" port needles should be used to access the port. Once the port is accessed, a blood return should be checked. This helps ensure that the port is in the vein and is not clogged.   If your port needs to remain accessed for a constant infusion, a clear (transparent) bandage will be placed over the needle site. The bandage and needle will need to be changed every week, or as directed by your health care provider.   Keep the bandage covering the needle clean and dry. Do not get it wet. Follow your health care provider's instructions on how to take a shower or bath while the port is  accessed.   If your port does not need to stay accessed, no bandage is needed over the port.  WHAT IS FLUSHING? Flushing helps keep the port from getting clogged. Follow your health care provider's instructions on how and when to flush the port. Ports are usually flushed with saline solution or a medicine called heparin. The need for flushing will depend on how the port is used.   If the port is used for intermittent medicines or blood draws, the port will need to be flushed:   After medicines have been given.   After blood has been drawn.   As part of routine maintenance.   If a constant infusion is running, the port may not need to be flushed.  HOW LONG WILL MY PORT STAY IMPLANTED? The port can stay in for as long as your health care provider thinks it is needed. When it is time for the port to come out, surgery will be done to remove it. The procedure is similar to the one performed when the port was put in.  WHEN SHOULD I SEEK IMMEDIATE MEDICAL CARE? When you have an implanted port, you should seek immediate medical care if:   You notice a bad smell coming from the incision site.   You have swelling, redness, or drainage at the incision site.   You have more swelling or pain at the port site or the surrounding area.   You have a fever that is not controlled with medicine. Document Released: 08/03/2005 Document Revised: 05/24/2013 Document Reviewed: 04/10/2013 [facility] Patient Information 2014 [place].

## 2013-09-25 NOTE — Patient Instructions (Signed)
Breast Cancer Survivor Follow-Up Breast cancer begins when cells in the breast divide too rapidly. The extra cells form a lump (tumor). When the cancer is treated, the goal is to get rid of all cancer cells. However, sometimes a few cells survive. These cancer cells can then grow. They become recurrent cancer. This means the cancer comes back after treatment.  Most cases of recurrent breast cancer develop 3 to 5 years after treatment. However, sometimes it comes back just a few months after treatment. Other times, it does not come back until years later. If the cancer comes back in the same area as the first breast cancer, it is called a local recurrence. If the cancer comes back somewhere else in the body, it is called regional recurrence if the site is fairly near the breast or distant recurrence if it is far from the breast. Your caregiver may also use the term metastasize to indicate a cancer that has gone to another part of your body. Treatment is still possible after either kind of recurrence. The cancer can still be controlled.  CAUSES OF RECURRENT CANCER No one knows exactly why breast cancer starts in the first place. Why the cancer comes back after treatment is also not clear. It is known that certain conditions, called risk factors, can make this more likely. They include:  Developing breast cancer for the first time before age 60.  Having breast cancer that involves the lymph nodes. These are small, round pieces of tissue found all over the body. Their job is to help fight infections.  Having a large tumor. Cancer is more apt to come back if the first tumor was bigger than 2 inches (5 cm).  Having certain types of breast cancer, such as:  Inflammatory breast cancer. This rare type grows rapidly and causes the breast to become red and swollen.  A high-grade tumor. The grade of a tumor indicates how fast it will grow and spread. High-grade tumors grow more quickly than other types.  HER2  cancer. This refers to the tumor's genetic makeup. Tumors that have this type of gene are more likely to come back after treatment.  Having close tumor margins. This refers to the space between the tumor and normal, noncancerous cells. If the space is small, the tumor has a greater chance of coming back.  Having treatment involving a surgery to remove the tumor but not the entire breast (lumpectomy) and no radiation therapy. CARE AFTER BREAST CANCER Home Monitoring Women who have had breast cancer should continue to examine their breasts every month. The goal is to catch the cancer quickly if it comes back. Many women find it helpful to do so on the same day each month and to mark the calendar as a reminder. Let your caregiver know immediately if you have any signs of recurrent breast cancer. Symptoms will vary, depending on where the cancer recurs. The original type of treatment can also make a difference. Symptoms of local recurrence after a lumpectomy or a recurrence in the opposite breast may include:  A new lump or thickening in the breast.  A change in the way the skin looks on the breast (such as a rash, dimpling, or wrinkling).  Redness or swelling of the breast.  Changes in the nipple (such as being red, puckered, swollen, or leaking fluid). Symptoms of a recurrence after a breast removal surgery (mastectomy) may include:  A lump or thickening under the skin.  A thickening around the mastectomy scar. Symptoms   of regional recurrence in the lymph nodes near the breast may include:  A lump under the arm or above the collarbone.  Swelling of the arm.  Pain in the arm, shoulder, or chest.  Numbness in the hand or arm. Symptoms of distant recurrence may include:  A cough that does not go away.  Trouble breathing or shortness of breath.  Pain in the bones or the chest. This is pain that lasts or does not respond to rest and medicine.  Headaches.  Sudden vision  problems.  Dizziness.  Nausea or vomiting.  Losing weight without trying to.  Persistent abdominal pain.  Changes in bowel movements or blood in the stool.  Yellowing of the skin or eyes (jaundice).  Blood in the urine or bloody vaginal discharge. Clinical Monitoring  It is helpful to keep a schedule of appointments for needed tests and exams. This includes physical exams, breast exams, exams of the lymph nodes, and general exams.  For the first 3 years after being treated for breast cancer, see your caregiver every 3 to 6 months.  For years 4 and 5 after breast cancer, see your caregiver every 6 to 12 months.  After 5 years, see your caregiver at least once a year.  Regular breast X-rays (mammograms) should continue even if you had a mastectomy.  A mammogram should be done 1 year after the mammogram that first detected breast cancer.  A mammogram should be done every 6 to 12 months after that. Follow your caregiver's advice.  A pelvic exam done by your caregiver checks whether female organs are the normal size and shape. The exam is usually done every year. Ask your caregiver if that schedule is right for you.  Women taking tamoxifen should report any vaginal bleeding immediately to their caregiver. Tamoxifen is often given to women with a certain type of breast cancer. It has been shown to help prevent recurrence.  You will need to decide who your primary caregiver will be.  Most people continue to see their cancer specialist (oncologist) every 3 to 6 months for the first year after cancer treatment.  At some point, you may want to go back to seeing your family caregiver. You would no longer see your oncologist for regular checkups. Many women do this about 1 year after their first diagnosis of breast cancer.  You will still need to be seen every so often by your oncologist. Ask how often that should be. Coordinate this with your family or primary caregiver.  Think about  having genetic counseling. This would provide information on traits that can be passed or inherited from one generation to the next. In some cases, breast cancer runs in families. Tell your caregiver if you:  Are of Ashkenazi Jewish heritage.  Have any family member who has had ovarian cancer.  Have a mother, sister, or daughter who had breast cancer before age 7.  Have 2 or more close relatives who have had breast cancer. This means a mother, sister, daughter, aunt, or grandmother.  Had breast cancer in both breasts.  Have a female relative who has had breast cancer.  Some tests are not recommended for routine screening. Someone recovering from breast cancer does not need to have these tests if there are no problems. The tests have risks, such as radiation exposure, and can be costly. The risks of these tests are thought to be greater than the benefits:  Blood tests.  Chest X-rays.  Bone scans.  Liver ultrasound.  Computed tomography (CT scan).  Positron emission tomography (PET scan).  Magnetic resonance imaging (MRI scan). DIAGNOSIS OF RECURRENT CANCER Recurrent breast cancer may be suspected for various reasons. A mammogram may not look normal. You might feel a lump or have other symptoms. Your caregiver may find something unusual during an exam. To be sure, your caregiver will probably order some tests. The tests are needed because there are symptoms or hints of a problem. They could include:  Blood tests, including a test to check how well the liver is working. The liver is a common site for a distant cancer recurrence.  Imaging tests that create pictures of the inside of the body. These tests include:  Chest X-rays to show if the cancer has come back in the lungs.  CT scans to create detailed pictures of various areas of the body and help find a distant recurrence.  MRI scans to find anything unusual in the breast, chest, or lymph nodes.  Breast ultrasound tests to  examine the breasts.  Bone scans to create a picture of your whole skeleton and find cancer in bony areas.  PET scans to create an image of the whole body. PET scans can be used together with CT scans to show more detail.  Biopsy. A small sample of tissue is taken and checked under a microscope. If cancer cells are found, they may be tested to see if they contain the HER2 gene or the hormones estrogen and progesterone. This will help your caregiver decide how to treat the recurrent cancer. TREATMENT  How recurrent breast cancer is treated depends on where the new cancer is found. The type of treatment that was used for the first breast cancer makes a difference, too. A combination of treatments may be used. Options include:  Surgery.  If the cancer comes back in the breast that was not treated before, you may need a lumpectomy or mastectomy.  If the cancer comes back in the breast that was treated before, you may need a mastectomy.  The lymph nodes under the arm may need to be removed.  Radiation therapy.  For a local recurrence, radiation may be used if it was not used during the first treatment.  For a distance recurrence, radiation is sometimes used.  Chemotherapy.  This may be used before surgery to treat recurrent breast cancer.  This may be used to treat recurrent cancer that cannot be treated with surgery.  This may be used to treat a distant recurrence.  Hormone therapy.  Women with the HER2 gene may be given hormone therapy to attack this gene. Document Released: 04/01/2011 Document Revised: 10/26/2011 Document Reviewed: 04/01/2011 [employer] Patient Information 2014 [employer].  

## 2013-12-20 ENCOUNTER — Telehealth: Payer: Self-pay

## 2013-12-20 NOTE — Telephone Encounter (Signed)
Returned patient call.  Pt states she does not have insurance yet and does she need to come in for her port flush.  Advised patient that her port needed to be flushed every 6-8 weeks to remain patent and that I would provide her information to financial advocacy for info on payment and insurance.  Routed to [facility].

## 2013-12-22 ENCOUNTER — Ambulatory Visit (HOSPITAL_BASED_OUTPATIENT_CLINIC_OR_DEPARTMENT_OTHER): Payer: Self-pay

## 2013-12-22 VITALS — BP 140/90 | HR 88 | Temp 98.2°F

## 2013-12-22 DIAGNOSIS — C50412 Malignant neoplasm of upper-outer quadrant of left female breast: Secondary | ICD-10-CM

## 2013-12-22 DIAGNOSIS — C50419 Malignant neoplasm of upper-outer quadrant of unspecified female breast: Secondary | ICD-10-CM

## 2013-12-22 DIAGNOSIS — Z5111 Encounter for antineoplastic chemotherapy: Secondary | ICD-10-CM

## 2013-12-22 MED ORDER — SODIUM CHLORIDE 0.9 % IJ SOLN
10.0000 mL | INTRAMUSCULAR | Status: DC | PRN
  Administered 2013-12-22: 10 mL via INTRAVENOUS
  Filled 2013-12-22: qty 10

## 2013-12-22 MED ORDER — HEPARIN SOD (PORK) LOCK FLUSH 100 UNIT/ML IV SOLN
500.0000 [IU] | Freq: Once | INTRAVENOUS | Status: AC
  Administered 2013-12-22: 500 [IU] via INTRAVENOUS
  Filled 2013-12-22: qty 5

## 2013-12-22 MED ORDER — GOSERELIN ACETATE 10.8 MG ~~LOC~~ IMPL
10.8000 mg | DRUG_IMPLANT | SUBCUTANEOUS | Status: DC
  Administered 2013-12-22: 10.8 mg via SUBCUTANEOUS
  Filled 2013-12-22: qty 10.8

## 2013-12-22 NOTE — Patient Instructions (Signed)

## 2013-12-28 ENCOUNTER — Encounter: Payer: Self-pay | Admitting: Oncology

## 2013-12-28 NOTE — Progress Notes (Signed)
Patient left a message to call. I called [phone] and the line busy.

## 2014-01-12 ENCOUNTER — Telehealth: Payer: Self-pay | Admitting: Hematology and Oncology

## 2014-01-12 NOTE — Telephone Encounter (Signed)
, °

## 2014-01-25 ENCOUNTER — Other Ambulatory Visit: Payer: Self-pay | Admitting: *Deleted

## 2014-01-25 ENCOUNTER — Ambulatory Visit: Payer: Self-pay | Admitting: Oncology

## 2014-01-25 ENCOUNTER — Other Ambulatory Visit: Payer: Self-pay

## 2014-01-25 DIAGNOSIS — E876 Hypokalemia: Secondary | ICD-10-CM

## 2014-01-25 MED ORDER — POTASSIUM CHLORIDE CRYS ER 20 MEQ PO TBCR: 20.0000 meq | EXTENDED_RELEASE_TABLET | Freq: Two times a day (BID) | ORAL | Status: DC

## 2014-02-22 ENCOUNTER — Ambulatory Visit (HOSPITAL_BASED_OUTPATIENT_CLINIC_OR_DEPARTMENT_OTHER): Payer: BC Managed Care – PPO

## 2014-02-22 VITALS — BP 137/86 | HR 94 | Temp 98.1°F

## 2014-02-22 DIAGNOSIS — C50419 Malignant neoplasm of upper-outer quadrant of unspecified female breast: Secondary | ICD-10-CM

## 2014-02-22 DIAGNOSIS — Z452 Encounter for adjustment and management of vascular access device: Secondary | ICD-10-CM

## 2014-02-22 DIAGNOSIS — Z95828 Presence of other vascular implants and grafts: Secondary | ICD-10-CM

## 2014-02-22 MED ORDER — HEPARIN SOD (PORK) LOCK FLUSH 100 UNIT/ML IV SOLN
500.0000 [IU] | Freq: Once | INTRAVENOUS | Status: AC
  Administered 2014-02-22: 500 [IU] via INTRAVENOUS
  Filled 2014-02-22: qty 5

## 2014-02-22 MED ORDER — SODIUM CHLORIDE 0.9 % IJ SOLN
10.0000 mL | INTRAMUSCULAR | Status: DC | PRN
  Administered 2014-02-22: 10 mL via INTRAVENOUS
  Filled 2014-02-22: qty 10

## 2014-02-22 NOTE — Patient Instructions (Signed)

## 2014-03-20 ENCOUNTER — Other Ambulatory Visit: Payer: Self-pay

## 2014-03-23 ENCOUNTER — Ambulatory Visit (HOSPITAL_BASED_OUTPATIENT_CLINIC_OR_DEPARTMENT_OTHER): Payer: BC Managed Care – PPO

## 2014-03-23 VITALS — BP 142/86 | HR 85 | Temp 98.7°F

## 2014-03-23 DIAGNOSIS — Z5111 Encounter for antineoplastic chemotherapy: Secondary | ICD-10-CM

## 2014-03-23 DIAGNOSIS — C50419 Malignant neoplasm of upper-outer quadrant of unspecified female breast: Secondary | ICD-10-CM

## 2014-03-23 DIAGNOSIS — C773 Secondary and unspecified malignant neoplasm of axilla and upper limb lymph nodes: Secondary | ICD-10-CM

## 2014-03-23 DIAGNOSIS — C50412 Malignant neoplasm of upper-outer quadrant of left female breast: Secondary | ICD-10-CM

## 2014-03-23 MED ORDER — GOSERELIN ACETATE 10.8 MG ~~LOC~~ IMPL
10.8000 mg | DRUG_IMPLANT | SUBCUTANEOUS | Status: DC
  Administered 2014-03-23: 10.8 mg via SUBCUTANEOUS
  Filled 2014-03-23: qty 10.8

## 2014-04-11 ENCOUNTER — Other Ambulatory Visit: Payer: Self-pay | Admitting: *Deleted

## 2014-04-11 DIAGNOSIS — C50919 Malignant neoplasm of unspecified site of unspecified female breast: Secondary | ICD-10-CM

## 2014-04-12 ENCOUNTER — Other Ambulatory Visit (HOSPITAL_BASED_OUTPATIENT_CLINIC_OR_DEPARTMENT_OTHER): Payer: BC Managed Care – PPO

## 2014-04-12 ENCOUNTER — Ambulatory Visit (HOSPITAL_BASED_OUTPATIENT_CLINIC_OR_DEPARTMENT_OTHER): Payer: BC Managed Care – PPO | Admitting: Hematology and Oncology

## 2014-04-12 ENCOUNTER — Encounter: Payer: Self-pay | Admitting: Hematology and Oncology

## 2014-04-12 ENCOUNTER — Ambulatory Visit (HOSPITAL_BASED_OUTPATIENT_CLINIC_OR_DEPARTMENT_OTHER): Payer: BC Managed Care – PPO

## 2014-04-12 VITALS — BP 164/93 | HR 89 | Temp 98.8°F | Resp 18 | Ht 64.0 in | Wt 191.6 lb

## 2014-04-12 DIAGNOSIS — Z452 Encounter for adjustment and management of vascular access device: Secondary | ICD-10-CM

## 2014-04-12 DIAGNOSIS — C773 Secondary and unspecified malignant neoplasm of axilla and upper limb lymph nodes: Secondary | ICD-10-CM

## 2014-04-12 DIAGNOSIS — D869 Sarcoidosis, unspecified: Secondary | ICD-10-CM

## 2014-04-12 DIAGNOSIS — C7952 Secondary malignant neoplasm of bone marrow: Secondary | ICD-10-CM

## 2014-04-12 DIAGNOSIS — Z95828 Presence of other vascular implants and grafts: Secondary | ICD-10-CM

## 2014-04-12 DIAGNOSIS — C50419 Malignant neoplasm of upper-outer quadrant of unspecified female breast: Secondary | ICD-10-CM

## 2014-04-12 DIAGNOSIS — C50919 Malignant neoplasm of unspecified site of unspecified female breast: Secondary | ICD-10-CM

## 2014-04-12 DIAGNOSIS — C7951 Secondary malignant neoplasm of bone: Secondary | ICD-10-CM

## 2014-04-12 LAB — COMPREHENSIVE METABOLIC PANEL (CC13)
ALBUMIN: 3.3 g/dL — AB (ref 3.5–5.0)
ALT: 20 U/L (ref 0–55)
ANION GAP: 10 meq/L (ref 3–11)
AST: 21 U/L (ref 5–34)
Alkaline Phosphatase: 87 U/L (ref 40–150)
BUN: 9.9 mg/dL (ref 7.0–26.0)
CALCIUM: 9 mg/dL (ref 8.4–10.4)
CO2: 26 mEq/L (ref 22–29)
CREATININE: 0.7 mg/dL (ref 0.6–1.1)
Chloride: 107 mEq/L (ref 98–109)
GLUCOSE: 108 mg/dL (ref 70–140)
Potassium: 3.1 mEq/L — ABNORMAL LOW (ref 3.5–5.1)
SODIUM: 142 meq/L (ref 136–145)
Total Bilirubin: 0.51 mg/dL (ref 0.20–1.20)
Total Protein: 7.3 g/dL (ref 6.4–8.3)

## 2014-04-12 LAB — CBC WITH DIFFERENTIAL/PLATELET
BASO%: 0.6 % (ref 0.0–2.0)
Basophils Absolute: 0 10*3/uL (ref 0.0–0.1)
EOS%: 4.7 % (ref 0.0–7.0)
Eosinophils Absolute: 0.2 10*3/uL (ref 0.0–0.5)
HCT: 34.9 % (ref 34.8–46.6)
HGB: 11.2 g/dL — ABNORMAL LOW (ref 11.6–15.9)
LYMPH#: 0.6 10*3/uL — AB (ref 0.9–3.3)
LYMPH%: 16 % (ref 14.0–49.7)
MCH: 25.1 pg (ref 25.1–34.0)
MCHC: 32 g/dL (ref 31.5–36.0)
MCV: 78.5 fL — ABNORMAL LOW (ref 79.5–101.0)
MONO#: 0.5 10*3/uL (ref 0.1–0.9)
MONO%: 12.4 % (ref 0.0–14.0)
NEUT#: 2.5 10*3/uL (ref 1.5–6.5)
NEUT%: 66.3 % (ref 38.4–76.8)
Platelets: 222 10*3/uL (ref 145–400)
RBC: 4.45 10*6/uL (ref 3.70–5.45)
RDW: 14.7 % — ABNORMAL HIGH (ref 11.2–14.5)
WBC: 3.7 10*3/uL — AB (ref 3.9–10.3)

## 2014-04-12 MED ORDER — HEPARIN SOD (PORK) LOCK FLUSH 100 UNIT/ML IV SOLN
500.0000 [IU] | Freq: Once | INTRAVENOUS | Status: AC
  Administered 2014-04-12: 500 [IU] via INTRAVENOUS
  Filled 2014-04-12: qty 5

## 2014-04-12 MED ORDER — SODIUM CHLORIDE 0.9 % IJ SOLN
10.0000 mL | INTRAMUSCULAR | Status: DC | PRN
  Administered 2014-04-12: 10 mL via INTRAVENOUS
  Filled 2014-04-12: qty 10

## 2014-04-12 NOTE — Patient Instructions (Signed)

## 2014-04-12 NOTE — Progress Notes (Signed)
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 She also has suspicious bone lesions but      not easily accessible acc.to IR.  Therefore we gave her the benefit      of the doubt and treated her with curative intent. MRI suggests nodal      involvement in the axilla.   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 Liver metastases   09/14/2012 Cancer Staging PET scan- multiple hypermetabolic areas left pectoral node the breast mass, left hilar, suprahilar, right hilar, right suprahilar, subcarinal lymph node, liver, right iliac, bilateral inguinal nodes, sternum lytic lesion    09/19/2012 Procedure Liver biopsy- Sarcoidosis   09/26/2012 - 01/10/2013 Chemotherapy AC x 4 followed by weekly Paclitaxel x 12. Marker response supraclavicular and mediastinal nodes decreasing the breast mass and liver lesions resolution of sternal activity   04/10/2013 Surgery Bilateral mastectomies, left decidual IDC grade 2 x 0.7 cm with high-grade DCIS LV I 8/11 lymph nodes positive with extracapsular extension ER 99% PR 40%, HER-2 negative. R: No cancer   04/12/2013 Procedure Genetic testing did not reveal BRCA mutations   05/22/2013 - 06/23/2013 Radiation Therapy Adjuvant radiation therapy with Xeloda   06/23/2013 -  Anti-estrogen oral therapy Tamoxifen 20 mg daily with Zoladex every 3 months     CHIEF COMPLIANT: Followup of breast cancer  INTERVAL HISTORY: Brenda Avery is a 43 year old lady with above-mentioned history of either stage III or stage IV breast cancer. She had sarcoidosis that seems to be confusing the picture given the lymphadenopathy. On mastectomy she was noted to have 8/11 positive lymph nodes with extracapsular extension and she has been managed with adjuvant tamoxifen and Zoladex since November 2014. She has been tolerating this treatment fairly well other than hot flashes and muscle aches and pains. She is here for a followup and denies any new complaints or concerns. She denies any hot flashes. She complains of bilateral arm swelling. She has stopped exercising her hands and has not been using the sleeves as recommended previously.   REVIEW OF SYSTEMS:   Constitutional: Denies fevers, chills or abnormal weight loss Eyes: Denies blurriness of vision Ears, nose, mouth, throat, and face: Denies mucositis or sore throat Respiratory: Denies cough, dyspnea or wheezes Cardiovascular: Denies palpitation, chest discomfort or lower extremity swelling Gastrointestinal:  Denies nausea, heartburn or change in bowel habits Skin: Denies abnormal skin rashes Lymphatics: Denies new lymphadenopathy or easy bruising Neurological:Denies numbness, tingling or new weaknesses Behavioral/Psych: Mood is stable, no new changes  Breast: Feels tight in the breast from previous bilateral mastectomies. All other systems were reviewed with the patient and are negative.  I have reviewed the past medical history, past surgical history, social history and family history with the patient and they are unchanged from previous note.  ALLERGIES:  is  allergic to metronidazole and sulfonamide derivatives.  MEDICATIONS:  Current Outpatient Prescriptions  Medication Sig Dispense Refill  . potassium chloride SA (K-DUR,KLOR-CON) 20 MEQ tablet Take 1 tablet (20 mEq total) by mouth 2 (two) times daily.   60 tablet  1  . tamoxifen (NOLVADEX) 20 MG tablet Take 1 tablet (20 mg total) by mouth daily.  90 tablet  12   No current facility-administered medications for this visit.    PHYSICAL EXAMINATION: ECOG PERFORMANCE STATUS: 1 - Symptomatic but completely ambulatory  Filed Vitals:   04/12/14 1027  BP: 164/93  Pulse: 89  Temp: 98.8 F (37.1 C)  Resp: 18   Filed Weights   04/12/14 1027  Weight: 191 lb 9.6 oz (86.909 kg)    GENERAL:alert, no distress and comfortable SKIN: skin color, texture, turgor are normal, no rashes or significant lesions EYES: normal, Conjunctiva are pink and non-injected, sclera clear OROPHARYNX:no exudate, no erythema and lips, buccal mucosa, and tongue normal  NECK: supple, thyroid normal size, non-tender, without nodularity LYMPH:  no palpable lymphadenopathy in the cervical, axillary or inguinal LUNGS: clear to auscultation and percussion with normal breathing effort HEART: regular rate & rhythm and no murmurs and no lower extremity edema ABDOMEN:abdomen soft, non-tender and normal bowel sounds Musculoskeletal:no cyanosis of digits and no clubbing  NEURO: alert & oriented x 3 with fluent speech, no focal motor/sensory deficits  LABORATORY DATA:  I have reviewed the data as listed Appointment on 04/12/2014  Component Date Value Ref Range Status  . WBC 04/12/2014 3.7* 3.9 - 10.3 10e3/uL Final  . NEUT# 04/12/2014 2.5  1.5 - 6.5 10e3/uL Final  . HGB 04/12/2014 11.2* 11.6 - 15.9 g/dL Final  . HCT 04/12/2014 34.9  34.8 - 46.6 % Final  . Platelets 04/12/2014 222  145 - 400 10e3/uL Final  . MCV 04/12/2014 78.5* 79.5 - 101.0 fL Final  . MCH 04/12/2014 25.1  25.1 - 34.0 pg Final  . MCHC 04/12/2014 32.0  31.5 - 36.0 g/dL Final  . RBC 04/12/2014 4.45  3.70 - 5.45 10e6/uL Final  . RDW 04/12/2014 14.7* 11.2 - 14.5 % Final  . lymph# 04/12/2014 0.6* 0.9 - 3.3 10e3/uL Final  . MONO# 04/12/2014 0.5  0.1 - 0.9 10e3/uL Final  . Eosinophils Absolute 04/12/2014 0.2   0.0 - 0.5 10e3/uL Final  . Basophils Absolute 04/12/2014 0.0  0.0 - 0.1 10e3/uL Final  . NEUT% 04/12/2014 66.3  38.4 - 76.8 % Final  . LYMPH% 04/12/2014 16.0  14.0 - 49.7 % Final  . MONO% 04/12/2014 12.4  0.0 - 14.0 % Final  . EOS% 04/12/2014 4.7  0.0 - 7.0 % Final  . BASO% 04/12/2014 0.6  0.0 - 2.0 % Final  . Sodium 04/12/2014 142  136 - 145 mEq/L Final  . Potassium 04/12/2014 3.1* 3.5 - 5.1 mEq/L Final  . Chloride 04/12/2014 107  98 - 109 mEq/L Final  . CO2 04/12/2014 26  22 - 29 mEq/L Final  . Glucose 04/12/2014 108  70 - 140 mg/dl Final  . BUN 04/12/2014 9.9  7.0 - 26.0 mg/dL Final  . Creatinine 04/12/2014 0.7  0.6 - 1.1 mg/dL Final  . Total Bilirubin 04/12/2014 0.51  0.20 - 1.20 mg/dL Final  . Alkaline Phosphatase 04/12/2014 87  40 - 150 U/L Final  . AST 04/12/2014 21  5 - 34 U/L Final  . ALT 04/12/2014 20  0 - 55 U/L Final  . Total Protein 04/12/2014 7.3  6.4 - 8.3 g/dL Final  .  Albumin 04/12/2014 3.3* 3.5 - 5.0 g/dL Final  . Calcium 04/12/2014 9.0  8.4 - 10.4 mg/dL Final  . Anion Gap 04/12/2014 10  3 - 11 mEq/L Final    RADIOGRAPHIC STUDIES: No results found.   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 liver metastases: Currently on tamoxifen and Faslodex. Patient experiences depression symptoms, hot flashes, vaginal dryness and painful intercourse. Patient has not had any staging studies since year ago. I would like to repeat a PET/CT scan and see her afterwards. I encouraged her that she needs to continue with antiestrogen therapy as long as it is working.  Dyspareunia: Provided her with samples of lubricant. Hot flashes: Related to tamoxifen. Patient is managing without any limitations to her day-to-day activities. Patient is to continue on Faslodex as she is probably still perimenopausal. Another option would be to switch her from tamoxifen to anastrozole. I will discuss this with her  next followup based upon the PET/CT report.    Orders Placed This Encounter  Procedures  . NM PET Image Restage (PS) Whole Body    Standing Status: Future     Number of Occurrences:      Standing Expiration Date: 04/12/2015    Order Specific Question:  Reason for Exam (SYMPTOM  OR DIAGNOSIS REQUIRED)    Answer:  Restaging of metastatic breast cancer with liver and bone mets    Order Specific Question:  Is the patient pregnant?    Answer:  No    Order Specific Question:  Preferred imaging location?    Answer:  [facility]   The patient has a good understanding of the overall plan. she agrees with it. She will call with any problems that may develop before her next visit here.  I spent 25 minutes counseling the patient face to face. The total time spent in the appointment was 30 minutes and more than 50% was on counseling and review of test results    Rulon Eisenmenger, MD 04/12/2014 12:24 PM

## 2014-04-12 NOTE — Assessment & Plan Note (Signed)
Metastatic breast cancer T3, N2, M1 with lymph node bone and ? liver metastases: Currently on tamoxifen and Faslodex. Patient experiences depression symptoms, hot flashes, vaginal dryness and painful intercourse. Patient has not had any staging studies since year ago. I would like to repeat a PET/CT scan and see her afterwards. I encouraged her that she needs to continue with antiestrogen therapy as long as it is working.  Dyspareunia: Provided her with samples of lubricant. Hot flashes: Related to tamoxifen. Patient is managing without any limitations to her day-to-day activities. Patient is to continue on Faslodex as she is probably still perimenopausal. Another option would be to switch her from tamoxifen to anastrozole. I will discuss this with her next followup based upon the PET/CT report.

## 2014-04-13 ENCOUNTER — Telehealth: Payer: Self-pay | Admitting: Hematology and Oncology

## 2014-04-18 ENCOUNTER — Ambulatory Visit (HOSPITAL_COMMUNITY)
Admission: RE | Admit: 2014-04-18 | Discharge: 2014-04-18 | Disposition: A | Discharge status: Home / Self Care | Payer: BC Managed Care – PPO | Source: Ambulatory Visit | Attending: Diagnostic Radiology | Admitting: Diagnostic Radiology

## 2014-04-18 DIAGNOSIS — C7951 Secondary malignant neoplasm of bone: Secondary | ICD-10-CM | POA: Diagnosis not present

## 2014-04-18 DIAGNOSIS — C800 Disseminated malignant neoplasm, unspecified: Secondary | ICD-10-CM | POA: Diagnosis not present

## 2014-04-18 DIAGNOSIS — C50919 Malignant neoplasm of unspecified site of unspecified female breast: Secondary | ICD-10-CM | POA: Insufficient documentation

## 2014-04-18 DIAGNOSIS — C7952 Secondary malignant neoplasm of bone marrow: Secondary | ICD-10-CM

## 2014-04-18 DIAGNOSIS — J984 Other disorders of lung: Secondary | ICD-10-CM | POA: Insufficient documentation

## 2014-04-18 DIAGNOSIS — D7389 Other diseases of spleen: Secondary | ICD-10-CM | POA: Diagnosis not present

## 2014-04-18 DIAGNOSIS — C787 Secondary malignant neoplasm of liver and intrahepatic bile duct: Secondary | ICD-10-CM | POA: Insufficient documentation

## 2014-04-18 LAB — GLUCOSE, CAPILLARY: Glucose-Capillary: 114 mg/dL — ABNORMAL HIGH (ref 70–99)

## 2014-04-18 MED ORDER — FLUDEOXYGLUCOSE F - 18 (FDG) INJECTION
9.4000 | Freq: Once | INTRAVENOUS | Status: AC | PRN
  Administered 2014-04-18: 9.4 via INTRAVENOUS

## 2014-04-27 ENCOUNTER — Telehealth: Payer: Self-pay | Admitting: Hematology and Oncology

## 2014-04-27 ENCOUNTER — Ambulatory Visit (HOSPITAL_BASED_OUTPATIENT_CLINIC_OR_DEPARTMENT_OTHER): Payer: BC Managed Care – PPO | Admitting: Hematology and Oncology

## 2014-04-27 VITALS — BP 154/91 | HR 95 | Temp 98.5°F | Resp 19 | Ht 64.0 in | Wt 194.8 lb

## 2014-04-27 DIAGNOSIS — C779 Secondary and unspecified malignant neoplasm of lymph node, unspecified: Secondary | ICD-10-CM

## 2014-04-27 DIAGNOSIS — D869 Sarcoidosis, unspecified: Secondary | ICD-10-CM

## 2014-04-27 DIAGNOSIS — C50419 Malignant neoplasm of upper-outer quadrant of unspecified female breast: Secondary | ICD-10-CM

## 2014-04-27 DIAGNOSIS — C50919 Malignant neoplasm of unspecified site of unspecified female breast: Secondary | ICD-10-CM

## 2014-04-27 DIAGNOSIS — Z170 Estrogen receptor positive status [ER+]: Secondary | ICD-10-CM

## 2014-04-27 NOTE — Telephone Encounter (Signed)
pt aware inj cx

## 2014-04-27 NOTE — Progress Notes (Signed)
Per Dr. Lindi Adie, stopping zoladex.  POF sent to cancel all injection appointments.

## 2014-04-27 NOTE — Progress Notes (Signed)
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 She also has suspicious bone lesions but      not easily accessible acc.to IR.  Therefore we are giving her the benefit      of the doubt and treating her with curative intent. MRI suggests nodal      involvement in the axilla.   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 Liver metastases   09/14/2012 Cancer Staging PET scan- multiple hypermetabolic areas left pectoral node the breast mass, left hilar, suprahilar, right hilar, right suprahilar, subcarinal lymph node, liver, right iliac, bilateral inguinal nodes, sternum lytic lesion    09/19/2012 Procedure Liver biopsy- Sarcoidosis   09/26/2012 - 01/10/2013 Chemotherapy AC x 4 followed by weekly Paclitaxel x 12. Marker response supraclavicular and mediastinal nodes decreasing the breast mass and liver lesions resolution of sternal activity   04/10/2013 Surgery Bilateral mastectomies, left decidual IDC grade 2 x 0.7 cm with high-grade DCIS LV I 8/11 lymph nodes positive with extracapsular extension ER 99% PR 40%, HER-2 negative. R: No cancer   04/12/2013 Procedure Genetic testing did not reveal BRCA mutations   05/22/2013 - 06/23/2013 Radiation Therapy Adjuvant radiation therapy with Xeloda   06/23/2013 -  Anti-estrogen oral therapy Tamoxifen 20 mg daily with Zoladex every 3 months     CHIEF COMPLIANT: Patient is here for followup of a PET/CT scan  INTERVAL HISTORY: Brenda Avery is a 43 year old American lady with above-mentioned history of invasive ductal carcinoma along with sarcoidosis. Initial scans in January 2014 revealed multiple lymph node, liver, bone lesions. The liver biopsy was sarcoidosis. Recently she had a PET/CT scan and is here today to discuss the results. The PET scan does not show any widespread but it does show evidence of involvement in the sternum, pleural surface, lumbar spine, liver, spleen, right pelvic sidewall lymph node. A few of these findings were also present in the original PET CT scan. Hence it is unclear whether these are progression of disease or whether these are related to sarcoidosis. Patient denies any pain in the ribs or sternum or back. She is very stressed at work these lesions mean overall poor prognosis.  Her major complaints are related to symptoms of hot flashes and dyspareunia. We have prescribed lubricant gel which appears to be helping it. She is here today accompanied by her husband.   REVIEW OF SYSTEMS:   Constitutional: Denies fevers, chills or abnormal weight loss Eyes: Denies blurriness of vision Ears, nose, mouth, throat, and face: Denies mucositis or sore throat Respiratory: Denies cough, dyspnea or wheezes Cardiovascular: Denies palpitation, chest discomfort or lower extremity swelling Gastrointestinal:  Denies nausea, heartburn or change in bowel habits Skin: Denies abnormal skin rashes Lymphatics: Denies new lymphadenopathy or easy bruising Neurological:Denies numbness, tingling or new weaknesses Behavioral/Psych: Mood is stable, no  new changes  Breast:  denies any pain or lumps or nodules in either breasts All other systems were reviewed with the patient and are negative.  I have reviewed the past medical history, past surgical history, social history and family history with the patient and they are unchanged  from previous note.  ALLERGIES:  is allergic to metronidazole and sulfonamide derivatives.  MEDICATIONS:  Current Outpatient Prescriptions  Medication Sig Dispense Refill  . potassium chloride SA (K-DUR,KLOR-CON) 20 MEQ tablet Take 1 tablet (20 mEq total) by mouth 2 (two) times daily.  60 tablet  1  . tamoxifen (NOLVADEX) 20 MG tablet Take 1 tablet (20 mg total) by mouth daily.  90 tablet  12   No current facility-administered medications for this visit.    PHYSICAL EXAMINATION: ECOG PERFORMANCE STATUS: 0 - Asymptomatic  Filed Vitals:   04/27/14 0925  BP: 154/91  Pulse: 95  Temp: 98.5 F (36.9 C)  Resp: 19   Filed Weights   04/27/14 0925  Weight: 194 lb 12.8 oz (88.361 kg)    GENERAL:alert, no distress and comfortable SKIN: skin color, texture, turgor are normal, no rashes or significant lesions EYES: normal, Conjunctiva are pink and non-injected, sclera clear OROPHARYNX:no exudate, no erythema and lips, buccal mucosa, and tongue normal  NECK: supple, thyroid normal size, non-tender, without nodularity LYMPH:  no palpable lymphadenopathy in the cervical, axillary or inguinal LUNGS: clear to auscultation and percussion with normal breathing effort HEART: regular rate & rhythm and no murmurs and no lower extremity edema ABDOMEN:abdomen soft, non-tender and normal bowel sounds Musculoskeletal:no cyanosis of digits and no clubbing  NEURO: alert & oriented x 3 with fluent speech, no focal motor/sensory deficits  LABORATORY DATA:  I have reviewed the data as listed   Chemistry      Component Value Date/Time   NA 142 04/12/2014 1014   NA 138 04/12/2013 0715   K 3.1* 04/12/2014 1014   K 3.3* 04/12/2013 0715   CL 104 04/12/2013 0715   CO2 26 04/12/2014 1014   CO2 25 04/12/2013 0715   BUN 9.9 04/12/2014 1014   BUN 8 04/12/2013 0715   CREATININE 0.7 04/12/2014 1014   CREATININE 0.69 04/12/2013 0715      Component Value Date/Time   CALCIUM 9.0 04/12/2014 1014   CALCIUM 8.8  04/12/2013 0715   ALKPHOS 87 04/12/2014 1014   ALKPHOS 112 04/04/2013 0927   AST 21 04/12/2014 1014   AST 30 04/04/2013 0927   ALT 20 04/12/2014 1014   ALT 31 04/04/2013 0927   BILITOT 0.51 04/12/2014 1014   BILITOT 0.5 04/04/2013 0927       Lab Results  Component Value Date   WBC 3.7* 04/12/2014   HGB 11.2* 04/12/2014   HCT 34.9 04/12/2014   MCV 78.5* 04/12/2014   PLT 222 04/12/2014   NEUTROABS 2.5 04/12/2014     RADIOGRAPHIC STUDIES: I have personally reviewed the radiology reports and agreed with their findings. PET/CT scan 9 2015 reveals multiple bone lesions right-sided sternal lesion SUV 5.7, lower lumbar spine SUV 4.9, right iliac bone SUV 7.5 (could be lymph node), left upper lobe lung pleural irregularity could be inflammation, persistent nodular uptake in the liver, splenic lesions seen as before.   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 Metastatic breast cancer with prior history of lymph node, bone, liver metastases: Previous biopsy of liver did not show cancer and it showed sarcoidosis. I reviewed the PET/CT scan that was done recently  which showed sternal, lumbar spine, liver, spleen, little, right pelvic sidewall lymph node metastases. It is unclear how many of these are related to sarcoidosis and how many unrelated to her breast cancer. I would like to be biopsied her to come from whether these are truly metastatic disease. I will discuss with interventional radiology regarding the best place to obtain additional tissue biopsies. I would like to see her back after the biopsy to discuss the treatment plan.  2. If these spots are related to sarcoidosis, then she can discontinue with tamoxifen as she has been doing currently. If these spots are related to metastatic breast cancer, then we can consider exchanging her treatment to Faslodex.  I will call the patient with appointments for interventional radiology and followup after the biopsies are  done.  3. I discussed with her that the Zoladex with tamoxifen is not significantly better than tamoxifen alone and since she has fallopian tubes tied, we can safely discontinue Zoladex treatments at this time.  4. Hot flashes and dyspareunia: She is using lubrication and this is helping her.   No orders of the defined types were placed in this encounter.   The patient has a good understanding of the overall plan. she agrees with it. She will call with any problems that may develop before her next visit here.  I spent 25 minutes counseling the patient face to face. The total time spent in the appointment was 30 minutes and more than 50% was on counseling and review of test results    Rulon Eisenmenger, MD 04/27/2014 10:59 AM

## 2014-04-27 NOTE — Assessment & Plan Note (Signed)
1. Metastatic breast cancer with prior history of lymph node, bone, liver metastases: Previous biopsy of liver did not show cancer and it showed sarcoidosis. I reviewed the PET/CT scan that was done recently which showed sternal, lumbar spine, liver, spleen, little, right pelvic sidewall lymph node metastases. It is unclear how many of these are related to sarcoidosis and how many unrelated to her breast cancer. I would like to be biopsied her to come from whether these are truly metastatic disease. I will discuss with interventional radiology regarding the best place to obtain additional tissue biopsies. I would like to see her back after the biopsy to discuss the treatment plan.  2. If these spots are related to sarcoidosis, then she can discontinue with tamoxifen as she has been doing currently. If these spots are related to metastatic breast cancer, then we can consider exchanging her treatment to Faslodex.  I will call the patient with appointments for interventional radiology and followup after the biopsies are done.  3. I discussed with her that the Zoladex with tamoxifen is not significantly better than tamoxifen alone and since she has fallopian tubes tied, we can safely discontinue Zoladex treatments at this time.  4. Hot flashes and dyspareunia: She is using lubrication and this is helping her.

## 2014-05-02 ENCOUNTER — Other Ambulatory Visit: Payer: Self-pay

## 2014-05-02 DIAGNOSIS — C50919 Malignant neoplasm of unspecified site of unspecified female breast: Secondary | ICD-10-CM

## 2014-05-14 ENCOUNTER — Ambulatory Visit (HOSPITAL_COMMUNITY): Payer: BC Managed Care – PPO

## 2014-05-23 ENCOUNTER — Encounter (HOSPITAL_COMMUNITY): Payer: Self-pay | Admitting: Pharmacy Technician

## 2014-05-25 ENCOUNTER — Other Ambulatory Visit: Payer: Self-pay | Admitting: Radiology

## 2014-05-28 ENCOUNTER — Ambulatory Visit (HOSPITAL_COMMUNITY): Admission: RE | Admit: 2014-05-28 | Payer: BC Managed Care – PPO | Source: Ambulatory Visit

## 2014-05-28 ENCOUNTER — Ambulatory Visit (HOSPITAL_COMMUNITY)
Admission: RE | Admit: 2014-05-28 | Discharge: 2014-05-28 | Disposition: A | Discharge status: Home / Self Care | Payer: BC Managed Care – PPO | Source: Ambulatory Visit | Attending: Adult Health | Admitting: Adult Health

## 2014-05-28 ENCOUNTER — Encounter (HOSPITAL_COMMUNITY): Payer: Self-pay

## 2014-05-28 DIAGNOSIS — C50919 Malignant neoplasm of unspecified site of unspecified female breast: Secondary | ICD-10-CM | POA: Insufficient documentation

## 2014-05-28 DIAGNOSIS — C799 Secondary malignant neoplasm of unspecified site: Secondary | ICD-10-CM | POA: Insufficient documentation

## 2014-05-28 LAB — CBC
HEMATOCRIT: 35 % — AB (ref 36.0–46.0)
HEMOGLOBIN: 11.5 g/dL — AB (ref 12.0–15.0)
MCH: 25.3 pg — AB (ref 26.0–34.0)
MCHC: 32.9 g/dL (ref 30.0–36.0)
MCV: 77.1 fL — ABNORMAL LOW (ref 78.0–100.0)
Platelets: 189 10*3/uL (ref 150–400)
RBC: 4.54 MIL/uL (ref 3.87–5.11)
RDW: 14.7 % (ref 11.5–15.5)
WBC: 3.1 10*3/uL — ABNORMAL LOW (ref 4.0–10.5)

## 2014-05-28 LAB — APTT: aPTT: 27 seconds (ref 24–37)

## 2014-05-28 LAB — PROTIME-INR
INR: 1.01 (ref 0.00–1.49)
PROTHROMBIN TIME: 13.4 s (ref 11.6–15.2)

## 2014-05-28 LAB — GLUCOSE, CAPILLARY: Glucose-Capillary: 132 mg/dL — ABNORMAL HIGH (ref 70–99)

## 2014-05-28 MED ORDER — FENTANYL CITRATE 0.05 MG/ML IJ SOLN
INTRAMUSCULAR | Status: AC | PRN
  Administered 2014-05-28: 100 ug via INTRAVENOUS

## 2014-05-28 MED ORDER — SODIUM CHLORIDE 0.9 % IV SOLN
INTRAVENOUS | Status: DC
  Administered 2014-05-28: 10:00:00 via INTRAVENOUS

## 2014-05-28 MED ORDER — MIDAZOLAM HCL 2 MG/2ML IJ SOLN
INTRAMUSCULAR | Status: AC | PRN
  Administered 2014-05-28: 1 mg via INTRAVENOUS
  Administered 2014-05-28: 2 mg via INTRAVENOUS
  Administered 2014-05-28: 1 mg via INTRAVENOUS

## 2014-05-28 MED ORDER — MIDAZOLAM HCL 2 MG/2ML IJ SOLN
INTRAMUSCULAR | Status: AC
  Filled 2014-05-28: qty 4

## 2014-05-28 MED ORDER — FENTANYL CITRATE 0.05 MG/ML IJ SOLN
INTRAMUSCULAR | Status: AC
  Filled 2014-05-28: qty 4

## 2014-05-28 NOTE — Procedures (Signed)
Interventional Radiology Procedure Note  Procedure: CT guided core biopsy of sternal lesion.  Complications: None Recommendations: Routine dressing care.  Follow up of biopsy.  Signed,  Dulcy Fanny. Earleen Newport, DO

## 2014-05-28 NOTE — Discharge Instructions (Signed)
Conscious Sedation Sedation is the use of medicines to promote relaxation and relieve discomfort and anxiety. Conscious sedation is a type of sedation. Under conscious sedation you are less alert than normal but are still able to respond to instructions or stimulation. Conscious sedation is used during short medical and dental procedures. It is milder than deep sedation or general anesthesia and allows you to return to your regular activities sooner.  LET [facility] CARE PROVIDER KNOW ABOUT:   Any allergies you have.  All medicines you are taking, including vitamins, herbs, eye drops, creams, and over-the-counter medicines.  Use of steroids (by mouth or creams).  Previous problems you or members of your family have had with the use of anesthetics.  Any blood disorders you have.  Previous surgeries you have had.  Medical conditions you have.  Possibility of pregnancy, if this applies.  Use of cigarettes, alcohol, or illegal drugs. RISKS AND COMPLICATIONS Generally, this is a safe procedure. However, as with any procedure, problems can occur. Possible problems include:  Oversedation.  Trouble breathing on your own. You may need to have a breathing tube until you are awake and breathing on your own.  Allergic reaction to any of the medicines used for the procedure. BEFORE THE PROCEDURE  You may have blood tests done. These tests can help show how well your kidneys and liver are working. They can also show how well your blood clots.  A physical exam will be done.  Only take medicines as directed by your health care provider. You may need to stop taking medicines (such as blood thinners, aspirin, or nonsteroidal anti-inflammatory drugs) before the procedure.   Do not eat or drink at least 6 hours before the procedure or as directed by your health care provider.  Arrange for a responsible adult, family member, or friend to take you home after the procedure. He or she should stay  with you for at least 24 hours after the procedure, until the medicine has worn off. PROCEDURE   An intravenous (IV) catheter will be inserted into one of your veins. Medicine will be able to flow directly into your body through this catheter. You may be given medicine through this tube to help prevent pain and help you relax.  The medical or dental procedure will be done. AFTER THE PROCEDURE  You will stay in a recovery area until the medicine has worn off. Your blood pressure and pulse will be checked.   Depending on the procedure you had, you may be allowed to go home when you can tolerate liquids and your pain is under control. Document Released: 04/28/2001 Document Revised: 08/08/2013 Document Reviewed: 04/10/2013 [facility] Patient Information 2015 [place], [place]. This information is not intended to replace advice given to you by your health care provider. Make sure you discuss any questions you have with your health care provider. Biopsy Care After Refer to this sheet in the next few weeks. These instructions provide you with information on caring for yourself after your procedure. Your caregiver may also give you more specific instructions. Your treatment has been planned according to current medical practices, but problems sometimes occur. Call your caregiver if you have any problems or questions after your procedure. If you had a fine needle biopsy, you may have soreness at the biopsy site for 1 to 2 days. If you had an open biopsy, you may have soreness at the biopsy site for 3 to 4 days. HOME CARE INSTRUCTIONS  You may resume normal diet  and activities as directed. Change bandages (dressings) tomorrow. May shower tomorrow and get wound wet. Only take over-the-counter or prescription medicines for pain, discomfort, or fever as directed by your caregiver.  SEEK IMMEDIATE MEDICAL CARE IF:  You have increased bleeding (more than a small spot) from the biopsy site. You notice redness,  swelling, or increasing pain at the biopsy site. You have pus coming from the biopsy site. You have a fever. You notice a bad smell coming from the biopsy site or dressing. You have a rash, have difficulty breathing, or have any allergic problems. MAKE SURE YOU:  Understand these instructions. Will watch your condition. Will get help right away if you are not doing well or get worse. Document Released: 02/20/2005 Document Revised: 10/26/2011 Document Reviewed: 01/29/2011 [facility] Patient Information 2015 [place], [place]. This information is not intended to replace advice given to you by your health care provider. Make sure you discuss any questions you have with your health care provider.

## 2014-05-28 NOTE — H&P (Signed)
Chief Complaint: "I am here for a biopsy."  Referring Physician(s): Gudena,Vinay K  History of Present Illness: Brenda Avery is a 43 y.o. female history of breast cancer s/p liver lesion biopsy 09/2012 pathology consistent with sarcoidosis. Interval development on recent PET of sternal, lumbar and right iliac bone lytic lesions. IR received request for image guided biopsy today with moderate sedation. Images reviewed and will proceed with sternal lytic lesion biopsy today. She denies any chest pain, shortness of breath or palpitations. She denies any active signs of bleeding or excessive bruising. She denies any recent fever or chills. The patient denies any history of sleep apnea or chronic oxygen use. She has previously tolerated sedation without complications during liver biopsy.    Past Medical History  Diagnosis Date  . Hyperlipidemia     diet therapy per patient request  . 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 Hematuria 2000/2001    evaluted in [place] with negative results  . Type II diabetes mellitus     diet therapy   . Breast CA     left  . S/P radiation therapy  05/10/2013-06/23/2013    Left chest wall / 50.4 Gray @ 1.8 Gray per fraction x 28 fractions/ Left Supraclavicular fossa / 47 Gray @1 .8 Gray per fraction x 25 fractions/ Left PAB / 45 Gy at 1.8 Gray per fraction x 25 fractions/Left scar / 10 Gray at [employer] per fraction x 5 fractions     . Status post chemotherapy      Radiation Therapy Radiosensitizing Xeloda  . Status post chemotherapy  09/26/2012 - 11/07/2012      Neoadjuvant chemotherapy consisting of 4 cycles of a.c. From 09/26/2012 through 11/07/2012 she then received 12 weeks of paclitaxel.  . Use of tamoxifen (Nolvadex) started 06/26/13    Past Surgical History  Procedure Laterality Date  . Rectocele repair  2004  . Cystoscopy  2001  . Nevus excision  2004    facial  . Portacath placement  09/20/2012    Procedure: INSERTION PORT-A-CATH;   Surgeon: Adin Hector, MD;  Location: [facility];  Service: General;  Laterality: N/A;  port a cath insertion with flouro and ultrasound   . Mastectomy Right 04/10/2013  . Mastectomy modified radical Left 04/10/2013  . Axillary sentinel node biopsy Right 04/10/2013  . Liver biopsy    . Breast biopsy Left 2014  . Tubal ligation  2004  . Mastectomy modified radical Left 04/10/2013    Procedure: LEFT MODIFIED RADICAL MASTECTOMY ;  Surgeon: Adin Hector, MD;  Location: [facility];  Service: General;  Laterality: Left;  . Simple mastectomy with axillary sentinel node biopsy Right 04/10/2013    Procedure: RIGHT TOTAL  MASTECTOMY WITH AXILLARY SENTINEL NODE BIOPSY;  Surgeon: Adin Hector, MD;  Location: [place];  Service: General;  Laterality: Right;  Methylene Blue Injection    Allergies: Metronidazole and Sulfonamide derivatives  Medications: Prior to Admission medications   Medication Sig Start Date End Date Taking? Authorizing Provider  potassium chloride SA (K-DUR,KLOR-CON) 20 MEQ tablet Take 20 mEq by mouth 2 (two) times daily.   Yes Historical Provider, MD  tamoxifen (NOLVADEX) 20 MG tablet Take 20 mg by mouth every morning.   Yes Historical Provider, MD  fluticasone (FLONASE) 50 MCG/ACT nasal spray Place 1-2 sprays into both nostrils once as needed for allergies or rhinitis.    Historical Provider, MD    Family History  Problem  Relation Age of Onset  . Hypertension Mother   . Diabetes Mother     MI  . Heart disease Mother   . Hypertension Sister     x4 only one HTN and one thats DI  . Hypertension Brother     x4 only 1 htn & dm  . Heart disease Father   . Breast cancer Maternal Aunt     diagnosed in her 23s  . Cancer Maternal Aunt     unknown  . Breast cancer Paternal Aunt     diagnosed in her 53s  . Cervical cancer Sister 80  . Breast cancer Maternal Aunt     diagnosed in her 50s  . Breast cancer Cousin     3 maternal cousins - 2 in their 86s, 1 in her 52s    History    Social History  . Marital Status: Married    Spouse Name: 2    Number of Children: N/A  . Years of Education: N/A   Occupational History  . Nursing Assistant at [employer] Topics  . Smoking status: Never Smoker   . Smokeless tobacco: Never Used  . Alcohol Use: No  . Drug Use: No  . Sexual Activity: Not Currently    Birth Control/ Protection: Surgical     Comment: menarche age 12, P48   Other Topics Concern  . None   Social History Narrative  . None    Review of Systems: A 12 point ROS discussed and pertinent positives are indicated in the HPI above.  All other systems are negative.  Review of Systems  Vital Signs: BP 149/83  Pulse 87  Temp(Src) 98.3 F (36.8 C) (Oral)  Resp 18  Ht 5\' 4"  (1.626 m)  Wt 193 lb (87.544 kg)  BMI 33.11 kg/m2  SpO2 100%  Physical Exam  Constitutional: She is oriented to person, place, and time. No distress.  HENT:  Head: Normocephalic and atraumatic.  Neck: No tracheal deviation present.  Cardiovascular: Normal rate and regular rhythm.  Exam reveals no gallop and no friction rub.   No murmur heard. Pulmonary/Chest: Effort normal and breath sounds normal. No respiratory distress. She has no wheezes. She has no rales.  Abdominal: Soft. Bowel sounds are normal. She exhibits no distension. There is no tenderness.  Neurological: She is alert and oriented to person, place, and time.  Skin: She is not diaphoretic.  Right anterior chest port a catheter palpated, scar well healed without erythema or drainage.   Psychiatric: She has a normal mood and affect. Her behavior is normal. Thought content normal.  anxious    Imaging: No results found.  Labs:  CBC:  Recent Labs  09/25/13 1327 04/12/14 1014 05/28/14 0935  WBC 2.4* 3.7* 3.1*  HGB 11.2* 11.2* 11.5*  HCT 34.1* 34.9 35.0*  PLT 182 222 189    COAGS:  Recent Labs  05/28/14 0935  INR 1.01  APTT 27    BMP:  Recent Labs  09/25/13 1328  04/12/14 1014  NA 142 142  K 3.1* 3.1*  CO2 28 26  GLUCOSE 108 108  BUN 9.9 9.9  CALCIUM 9.6 9.0  CREATININE 0.7 0.7    LIVER FUNCTION TESTS:  Recent Labs  09/25/13 1328 04/12/14 1014  BILITOT 0.46 0.51  AST 23 21  ALT 22 20  ALKPHOS 110 87  PROT 7.1 7.3  ALBUMIN 3.4* 3.3*    Assessment and Plan: History of breast cancer S/p liver  lesion biopsy 09/2012 pathology consistent with sarcoidosis  Sternal, lumbar and right iliac bone lytic lesions Request for image guided biopsy today with moderate sedation Patient has been NPO, no blood thinners taken, labs and images reviewed Will proceed with biopsy of lytic sternal lesion for better yield if unable to obtain will biopsy right iliac wing Risks and Benefits discussed with the patient including, but not limited to Pneumothorax, bleeding or infection. All of the patient's questions were answered, patient is agreeable to proceed. Consent signed and in chart.     SignedHedy Jacob 05/28/2014, 10:06 AM

## 2014-05-29 ENCOUNTER — Telehealth: Payer: Self-pay | Admitting: Hematology and Oncology

## 2014-05-29 NOTE — Telephone Encounter (Signed)
s.w pt and advised on 10.16 appt....pt ok adn aware

## 2014-06-01 ENCOUNTER — Ambulatory Visit (HOSPITAL_BASED_OUTPATIENT_CLINIC_OR_DEPARTMENT_OTHER): Payer: BC Managed Care – PPO | Admitting: Hematology and Oncology

## 2014-06-01 VITALS — BP 157/90 | HR 102 | Temp 98.3°F | Resp 18 | Ht 64.0 in | Wt 191.5 lb

## 2014-06-01 DIAGNOSIS — C787 Secondary malignant neoplasm of liver and intrahepatic bile duct: Secondary | ICD-10-CM

## 2014-06-01 DIAGNOSIS — Z170 Estrogen receptor positive status [ER+]: Secondary | ICD-10-CM

## 2014-06-01 DIAGNOSIS — C7800 Secondary malignant neoplasm of unspecified lung: Secondary | ICD-10-CM

## 2014-06-01 DIAGNOSIS — C7951 Secondary malignant neoplasm of bone: Secondary | ICD-10-CM

## 2014-06-01 DIAGNOSIS — C50912 Malignant neoplasm of unspecified site of left female breast: Secondary | ICD-10-CM

## 2014-06-01 DIAGNOSIS — C50919 Malignant neoplasm of unspecified site of unspecified female breast: Secondary | ICD-10-CM

## 2014-06-01 DIAGNOSIS — C773 Secondary and unspecified malignant neoplasm of axilla and upper limb lymph nodes: Secondary | ICD-10-CM

## 2014-06-01 NOTE — Patient Instructions (Signed)
Fulvestrant injection What is this medicine? FULVESTRANT (ful VES trant) blocks the effects of estrogen. It is used to treat breast cancer in women past the age of menopause. This medicine may be used for other purposes; ask your health care provider or pharmacist if you have questions. COMMON BRAND NAME(S): FASLODEX What should I tell my health care provider before I take this medicine? They need to know if you have any of these conditions: -bleeding problems -liver disease -low levels of platelets in the blood -an unusual or allergic reaction to fulvestrant, other medicines, foods, dyes, or preservatives -pregnant or trying to get pregnant -breast-feeding How should I use this medicine? This medicine is for injection into a muscle. It is usually given by a health care professional in a hospital or clinic setting. Talk to your pediatrician regarding the use of this medicine in children. Special care may be needed. Overdosage: If you think you have taken too much of this medicine contact a poison control center or emergency room at once. NOTE: This medicine is only for you. Do not share this medicine with others. What if I miss a dose? It is important not to miss your dose. Call your doctor or health care professional if you are unable to keep an appointment. What may interact with this medicine? -medicines that treat or prevent blood clots like warfarin, enoxaparin, and dalteparin This list may not describe all possible interactions. Give your health care provider a list of all the medicines, herbs, non-prescription drugs, or dietary supplements you use. Also tell them if you smoke, drink alcohol, or use illegal drugs. Some items may interact with your medicine. What should I watch for while using this medicine? Your condition will be monitored carefully while you are receiving this medicine. You will need important blood work done while you are taking this medicine. Do not become pregnant  while taking this medicine. Women should inform their doctor if they wish to become pregnant or think they might be pregnant. There is a potential for serious side effects to an unborn child. Talk to your health care professional or pharmacist for more information. What side effects may I notice from receiving this medicine? Side effects that you should report to your doctor or health care professional as soon as possible: -allergic reactions like skin rash, itching or hives, swelling of the face, lips, or tongue -feeling faint or lightheaded, falls -fever or flu-like symptoms -sore throat -vaginal bleeding Side effects that usually do not require medical attention (report to your doctor or health care professional if they continue or are bothersome): -aches, pains -constipation or diarrhea -headache -hot flashes -nausea, vomiting -pain at site where injected -stomach pain This list may not describe all possible side effects. Call your doctor for medical advice about side effects. You may report side effects to FDA at 1-800-FDA-1088. Where should I keep my medicine? This drug is given in a hospital or clinic and will not be stored at home. NOTE: This sheet is a summary. It may not cover all possible information. If you have questions about this medicine, talk to your doctor, pharmacist, or health care provider.  2015, Elsevier/Gold Standard. (2007-12-12 15:39:24)  Denosumab injection What is this medicine? DENOSUMAB (den oh sue mab) slows bone breakdown. Prolia is used to treat osteoporosis in women after menopause and in men. Xgeva is used to prevent bone fractures and other bone problems caused by cancer bone metastases. Xgeva is also used to treat giant cell tumor of the bone.   This medicine may be used for other purposes; ask your health care provider or pharmacist if you have questions. COMMON BRAND NAME(S): Prolia, XGEVA What should I tell my health care provider before I take this  medicine? They need to know if you have any of these conditions: -dental disease -eczema -infection or history of infections -kidney disease or on dialysis -low blood calcium or vitamin D -malabsorption syndrome -scheduled to have surgery or tooth extraction -taking medicine that contains denosumab -thyroid or parathyroid disease -an unusual reaction to denosumab, other medicines, foods, dyes, or preservatives -pregnant or trying to get pregnant -breast-feeding How should I use this medicine? This medicine is for injection under the skin. It is given by a health care professional in a hospital or clinic setting. If you are getting Prolia, a special MedGuide will be given to you by the pharmacist with each prescription and refill. Be sure to read this information carefully each time. For Prolia, talk to your pediatrician regarding the use of this medicine in children. Special care may be needed. For Xgeva, talk to your pediatrician regarding the use of this medicine in children. While this drug may be prescribed for children as young as 13 years for selected conditions, precautions do apply. Overdosage: If you think you've taken too much of this medicine contact a poison control center or emergency room at once. Overdosage: If you think you have taken too much of this medicine contact a poison control center or emergency room at once. NOTE: This medicine is only for you. Do not share this medicine with others. What if I miss a dose? It is important not to miss your dose. Call your doctor or health care professional if you are unable to keep an appointment. What may interact with this medicine? Do not take this medicine with any of the following medications: -other medicines containing denosumab This medicine may also interact with the following medications: -medicines that suppress the immune system -medicines that treat cancer -steroid medicines like prednisone or cortisone This list  may not describe all possible interactions. Give your health care provider a list of all the medicines, herbs, non-prescription drugs, or dietary supplements you use. Also tell them if you smoke, drink alcohol, or use illegal drugs. Some items may interact with your medicine. What should I watch for while using this medicine? Visit your doctor or health care professional for regular checks on your progress. Your doctor or health care professional may order blood tests and other tests to see how you are doing. Call your doctor or health care professional if you get a cold or other infection while receiving this medicine. Do not treat yourself. This medicine may decrease your body's ability to fight infection. You should make sure you get enough calcium and vitamin D while you are taking this medicine, unless your doctor tells you not to. Discuss the foods you eat and the vitamins you take with your health care professional. See your dentist regularly. Brush and floss your teeth as directed. Before you have any dental work done, tell your dentist you are receiving this medicine. Do not become pregnant while taking this medicine or for 5 months after stopping it. Women should inform their doctor if they wish to become pregnant or think they might be pregnant. There is a potential for serious side effects to an unborn child. Talk to your health care professional or pharmacist for more information. What side effects may I notice from receiving this medicine? Side effects that   that you should report to your doctor or health care professional as soon as possible: -allergic reactions like skin rash, itching or hives, swelling of the face, lips, or tongue -breathing problems -chest pain -fast, irregular heartbeat -feeling faint or lightheaded, falls -fever, chills, or any other sign of infection -muscle spasms, tightening, or twitches -numbness or tingling -skin blisters or bumps, or is dry, peels, or red -slow  healing or unexplained pain in the mouth or jaw -unusual bleeding or bruising Side effects that usually do not require medical attention (Report these to your doctor or health care professional if they continue or are bothersome.): -muscle pain -stomach upset, gas This list may not describe all possible side effects. Call your doctor for medical advice about side effects. You may report side effects to FDA at 1-800-FDA-1088. Where should I keep my medicine? This medicine is only given in a clinic, doctor's office, or other health care setting and will not be stored at home. NOTE: This sheet is a summary. It may not cover all possible information. If you have questions about this medicine, talk to your doctor, pharmacist, or health care provider.  2015, Elsevier/Gold Standard. (2012-02-01 12:37:47)   Palbociclib capsules What is this medicine? PALBOCICLIB (pal boe SYE klib) is a chemotherapy drug. It targets a specific protein within cancer cells and stops the cancer cells from growing. This medicine is used to treat breast cancer. This medicine may be used for other purposes; ask your health care provider or pharmacist if you have questions. COMMON BRAND NAME(S): Ibrance What should I tell my health care provider before I take this medicine? They need to know if you have any of these conditions: -infection (especially a virus infection such as chickenpox, cold sores, or herpes) -low blood counts, like low white cell, platelet, or red cell counts -an unusual or allergic reaction to palbociclib, other medicines, foods, dyes, or preservatives -pregnant or trying to get pregnant -breast-feeding How should I use this medicine? Take this medicine by mouth with a glass of water. Follow the directions on the prescription label. Take this medicine with food. Avoid grapefruit and grapefruit juice while you are taking this medicine. Swallow the capsule whole. Do not cut, crush or chew this medicine.  Take your medicine at regular intervals. Do not take it more often than directed. Do not stop taking except on your doctor's advice. Talk to your pediatrician regarding the use of this medicine in children. Special care may be needed. Overdosage: If you think you've taken too much of this medicine contact a poison control center or emergency room at once. Overdosage: If you think you have taken too much of this medicine contact a poison control center or emergency room at once. NOTE: This medicine is only for you. Do not share this medicine with others. What if I miss a dose? If you miss a dose or vomit after taking a dose, do not take another dose on that day. Take your next dose at your regular time. What may interact with this medicine? This medicine may interact with the following medications: -alfentanil -antiviral medicines for HIV or AIDS -boceprevir -bosentan -carbamazepine -certain medicines for fungal infections like ketoconazole, itraconazole, posaconazole, voriconazole -clarithromycin -cyclosporine -ergot alkaloids like dihydroergotamine, ergotamine -everolimus -fentanyl -grapefruit juice -midazolam -modafinil -nafcillin -nefazodone -phenobarbital -phenytoin -pimozide -quinidine -rifampin -sirolimus -St. John's Wort -tacrolimus -telaprevir -telithromycin -verapamil This list may not describe all possible interactions. Give your health care provider a list of all the medicines, herbs, non-prescription drugs, or  dietary supplements you use. Also tell them if you smoke, drink alcohol, or use illegal drugs. Some items may interact with your medicine. What should I watch for while using this medicine? Visit your doctor for regular check ups. Report any side effects. Continue your course of treatment unless your doctor tells you to stop. You will need blood work done while you are taking this medicine. Do not become pregnant while taking this medicine or for 2 weeks after  stopping it. Women should inform their doctor if they wish to become pregnant or think they might be pregnant. There is a potential for serious side effects to an unborn child. Talk to your health care professional or pharmacist for more information. Do not breast-feed an infant while taking this medicine. Avoid taking products that contain aspirin, acetaminophen, ibuprofen, naproxen, or ketoprofen unless instructed by your doctor. These medicines may hide a fever. Be careful brushing and flossing your teeth or using a toothpick because you may get an infection or bleed more easily. If you have any dental work done, tell your dentist you are receiving this medicine. Call your doctor or health care professional for advice if you get a fever, chills or sore throat, or other symptoms of a cold or flu. Do not treat yourself. This drug decreases your body's ability to fight infections. Try to avoid being around people who are sick. This medicine may increase your risk to bruise or bleed. Call your doctor or health care professional if you notice any unusual bleeding. This drug may make you feel generally unwell. This is not uncommon, as chemotherapy can affect healthy cells as well as cancer cells. Report any side effects. Continue your course of treatment even though you feel ill unless your doctor tells you to stop. This medicine may cause low sperm counts in some men. However, this medicine is not usually used in men. Talk to your health care professional or pharmacist for more information. What side effects may I notice from receiving this medicine? Side effects that you should report to your doctor or health care professional as soon as possible: -allergic reactions like skin rash, itching or hives, swelling of the face, lips, or tongue -dizziness -mouth sores -low blood counts - this medicine may decrease the number of white blood cells, red blood cells and platelets. You may be at increased risk for  infections and bleeding. -pain, tingling, numbness in the hands or feet -severe or persistent diarrhea, nausea, vomiting -signs and symptoms of a blood clot such as breathing problems; changes in vision; chest pain; severe, sudden headache; pain, swelling, warmth in the leg; trouble speaking; sudden numbness or weakness of the face, arm or leg -signs and symptoms of infection like fever or chills; cough; sore throat; pain or trouble passing urine -signs of decreased platelets or bleeding - nosebleed, bruising, pinpoint red spots on the skin, black, tarry stools, blood in the urine -signs of decreased red blood cells - unusually weak or tired, feeling faint or lightheaded, falls Side effects that usually do not require medical attention (Report these to your doctor or health care professional if they continue or are bothersome.): -decreased appetite -hair thinning or hair loss -mild diarrhea -nausea -weak or tired This list may not describe all possible side effects. Call your doctor for medical advice about side effects. You may report side effects to FDA at 1-800-FDA-1088. Where should I keep my medicine? Keep out of the reach of children. Store between 20 and 25 degrees C (  68 and 77 degrees F). Throw away any unused medicine after the expiration date. NOTE: This sheet is a summary. It may not cover all possible information. If you have questions about this medicine, talk to your doctor, pharmacist, or health care provider.  2015, Elsevier/Gold Standard. (2013-09-28 12:38:37)

## 2014-06-01 NOTE — Progress Notes (Signed)
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 She also has suspicious bone lesions but      not easily accessible acc.to IR.  Therefore we are giving her the benefit      of the doubt and treating her with curative intent. MRI suggests nodal      involvement in the axilla.   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 Liver metastases   09/14/2012 Cancer Staging PET scan- multiple hypermetabolic areas left pectoral node the breast mass, left hilar, suprahilar, right hilar, right suprahilar, subcarinal lymph node, liver, right iliac, bilateral inguinal nodes, sternum lytic lesion    09/19/2012 Procedure Liver biopsy- Sarcoidosis   09/26/2012 - 01/10/2013 Chemotherapy AC x 4 followed by weekly Paclitaxel x 12. Marker response supraclavicular and mediastinal nodes decreasing the breast mass and liver lesions resolution of sternal activity   04/10/2013 Surgery Bilateral mastectomies, left decidual IDC grade 2 x 0.7 cm with high-grade DCIS LV I 8/11 lymph nodes positive with extracapsular extension ER 99% PR 40%, HER-2 negative. R: No cancer   04/12/2013 Procedure Genetic testing did not reveal BRCA mutations   05/22/2013 - 06/23/2013 Radiation Therapy Adjuvant radiation therapy with Xeloda   06/23/2013 -  Anti-estrogen oral therapy Tamoxifen 20 mg daily with Zoladex every 3 months    05/28/2014 Relapse/Recurrence Bony metastatic disease: Sternum biopsy metastatic carcinoma, HER-2 negative ratio 0.74    CHIEF COMPLIANT: Patient is here for followup of recent biopsies  INTERVAL HISTORY: Brenda Avery is a 43 year old African American lady with above-mentioned history of breast cancer who had bone lesions on PET CT scan. She underwent a biopsy of the sternum and is here today to discuss the results. Pathology came back as metastatic breast cancer ER/PR positive HER-2 negative. She does not have any symptoms related to metastatic disease. She denies any pains or discomfort anywhere.  REVIEW OF SYSTEMS:   Constitutional: Denies fevers, chills or abnormal weight loss Eyes: Denies blurriness of vision Ears, nose, mouth, throat, and face: Denies mucositis or sore throat Respiratory: Denies cough, dyspnea or wheezes Cardiovascular: Denies palpitation, chest discomfort or lower extremity swelling Gastrointestinal:  Denies nausea, heartburn or change in bowel habits Skin: Denies abnormal skin rashes Lymphatics: Denies new lymphadenopathy or easy bruising Neurological:Denies numbness, tingling or new weaknesses Behavioral/Psych: Mood is stable, no new changes  All other systems were reviewed with the patient and are negative.  I have reviewed the past medical history, past surgical history, social history and family history with the patient and they are unchanged from previous note.  ALLERGIES:  is allergic to metronidazole and sulfonamide derivatives.  MEDICATIONS:  Current Outpatient Prescriptions  Medication Sig Dispense Refill  . fluticasone (FLONASE) 50 MCG/ACT nasal spray Place 1-2 sprays into both nostrils once as needed for allergies or rhinitis.      Marland Kitchen  potassium chloride SA (K-DUR,KLOR-CON) 20 MEQ tablet Take 20 mEq by mouth 2 (two) times daily.      . tamoxifen (NOLVADEX) 20 MG tablet Take 20 mg by mouth every morning.       No current facility-administered  medications for this visit.    PHYSICAL EXAMINATION: ECOG PERFORMANCE STATUS: 0 - Asymptomatic  Filed Vitals:   06/01/14 1346  BP: 157/90  Pulse: 102  Temp: 98.3 F (36.8 C)  Resp: 18   Filed Weights   06/01/14 1346  Weight: 191 lb 8 oz (86.864 kg)    GENERAL:alert, no distress and comfortable SKIN: skin color, texture, turgor are normal, no rashes or significant lesions EYES: normal, Conjunctiva are pink and non-injected, sclera clear OROPHARYNX:no exudate, no erythema and lips, buccal mucosa, and tongue normal  NECK: supple, thyroid normal size, non-tender, without nodularity LYMPH:  no palpable lymphadenopathy in the cervical, axillary or inguinal LUNGS: clear to auscultation and percussion with normal breathing effort HEART: regular rate & rhythm and no murmurs and no lower extremity edema ABDOMEN:abdomen soft, non-tender and normal bowel sounds Musculoskeletal:no cyanosis of digits and no clubbing  NEURO: alert & oriented x 3 with fluent speech, no focal motor/sensory deficits  LABORATORY DATA:  I have reviewed the data as listed   Chemistry      Component Value Date/Time   NA 142 04/12/2014 1014   NA 138 04/12/2013 0715   K 3.1* 04/12/2014 1014   K 3.3* 04/12/2013 0715   CL 104 04/12/2013 0715   CO2 26 04/12/2014 1014   CO2 25 04/12/2013 0715   BUN 9.9 04/12/2014 1014   BUN 8 04/12/2013 0715   CREATININE 0.7 04/12/2014 1014   CREATININE 0.69 04/12/2013 0715      Component Value Date/Time   CALCIUM 9.0 04/12/2014 1014   CALCIUM 8.8 04/12/2013 0715   ALKPHOS 87 04/12/2014 1014   ALKPHOS 112 04/04/2013 0927   AST 21 04/12/2014 1014   AST 30 04/04/2013 0927   ALT 20 04/12/2014 1014   ALT 31 04/04/2013 0927   BILITOT 0.51 04/12/2014 1014   BILITOT 0.5 04/04/2013 0927       Lab Results  Component Value Date   WBC 3.1* 05/28/2014   HGB 11.5* 05/28/2014   HCT 35.0* 05/28/2014   MCV 77.1* 05/28/2014   PLT 189 05/28/2014   NEUTROABS 2.5 04/12/2014   ASSESSMENT & PLAN:   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  I recommended systemic therapy with Faslodex with Ibrance along with Xgeva for bone metastases. I discussed the risks and benefits of treatment including injection site reaction or Faslodex as well as antiestrogen and side effects in terms of hot flashes or muscle aches and pains. Leslee Home was discussed in great detail including the risk of severe neutropenia risk of infection, risk of blood clots as well as nausea vomiting. Our plan is to start him on 25 mg of Ibrance to be taken 3 weeks on one week off with adjustment of the dosage based on cytopenias. Faslodex will be given days 1 and 15 followed by once a month 500 mg dose.  Patient transferred to [place] next week. I plan to see her back with the start of treatment and periodically thereafter to monitor her blood counts. Our plan is to do scans but the PET/CT  once every 3 months to assess response to treatment. Patient is very distraught because she had done everything possible up until did not in terms of bilateral mastectomies surgery, radiation and chemotherapy and in spite of all that she has metastatic disease.  Goals of treatment: Palliation. Given stage IV disease, patient understands that this is not curable but we can prolong her life and slow down the progression of her disease. Patient will need to be on treatment as long as it is responding as long as she is tolerating it.   No orders of the defined types were placed in this encounter.   The patient has a good understanding of the overall plan. she agrees with it. She will call with any problems that may develop before her next visit here.  I spent 35 minutes counseling the patient face to face. The total  time spent in the appointment was 45 minutes and more than 50% was on counseling and review of test results    Rulon Eisenmenger, MD 06/01/2014 3:06 PM

## 2014-06-01 NOTE — Assessment & Plan Note (Signed)
Metastatic breast cancer: Status post sternal biopsy proving metastatic disease ER 100% PR 3% HER-2 negative: Previous PET CT scan showed diffuse multiple lung metastases, liver metastases, luminal irregularity, spleen lesions.  I recommended systemic therapy with Faslodex with Ibrance along with Xgeva for bone metastases. I discussed the risks and benefits of treatment including injection site reaction or Faslodex as well as antiestrogen and side effects in terms of hot flashes or muscle aches and pains. Leslee Home was discussed in great detail including the risk of severe neutropenia risk of infection, risk of blood clots as well as nausea vomiting. Our plan is to start him on 25 mg of Ibrance to be taken 3 weeks on one week off with adjustment of the dosage based on cytopenias. Faslodex will be given days 1 and 15 followed by once a month 500 mg dose.  Patient transferred to [place] next week. I plan to see her back with the start of treatment and periodically thereafter to monitor her blood counts. Our plan is to do scans but the PET/CT once every 3 months to assess response to treatment. Patient is very distraught because she had done everything possible up until did not in terms of bilateral mastectomies surgery, radiation and chemotherapy and in spite of all that she has metastatic disease.  Goals of treatment: Palliation. Given stage IV disease, patient understands that this is not curable but we can prolong her life and slow down the progression of her disease. Patient will need to be on treatment as long as it is responding as long as she is tolerating it.

## 2014-06-04 ENCOUNTER — Other Ambulatory Visit: Payer: Self-pay

## 2014-06-04 DIAGNOSIS — C50919 Malignant neoplasm of unspecified site of unspecified female breast: Secondary | ICD-10-CM

## 2014-06-04 MED ORDER — PALBOCICLIB 125 MG PO CAPS: 125.0000 mg | ORAL_CAPSULE | Freq: Every day | ORAL | Status: DC

## 2014-06-05 ENCOUNTER — Telehealth: Payer: Self-pay | Admitting: Hematology and Oncology

## 2014-06-07 ENCOUNTER — Encounter: Payer: Self-pay | Admitting: Oncology

## 2014-06-07 ENCOUNTER — Encounter: Payer: Self-pay | Admitting: Hematology and Oncology

## 2014-06-07 ENCOUNTER — Other Ambulatory Visit: Payer: Self-pay

## 2014-06-07 DIAGNOSIS — C50919 Malignant neoplasm of unspecified site of unspecified female breast: Secondary | ICD-10-CM

## 2014-06-07 NOTE — Progress Notes (Signed)
Faxed ibrance prescription to [facility] °

## 2014-06-07 NOTE — Progress Notes (Signed)
[employer] approved ondansetron from 06/07/14-06/08/15

## 2014-06-12 ENCOUNTER — Telehealth: Payer: Self-pay

## 2014-06-12 NOTE — Telephone Encounter (Signed)
Returned pt call re: Fri appt.  Pt wanted to move appt up so her husband can drive her.  Pt said she may be 15 minutes late Fri morning because her husband does not get off work until 33.

## 2014-06-13 ENCOUNTER — Telehealth: Payer: Self-pay | Admitting: *Deleted

## 2014-06-13 NOTE — Telephone Encounter (Signed)
Called [facility] about Ibrance. Informed that ship date was 10/27 and delivery date is 10/28.

## 2014-06-14 ENCOUNTER — Other Ambulatory Visit: Payer: Self-pay | Admitting: *Deleted

## 2014-06-14 DIAGNOSIS — C50919 Malignant neoplasm of unspecified site of unspecified female breast: Secondary | ICD-10-CM

## 2014-06-15 ENCOUNTER — Telehealth: Payer: Self-pay | Admitting: Hematology and Oncology

## 2014-06-15 ENCOUNTER — Ambulatory Visit (HOSPITAL_BASED_OUTPATIENT_CLINIC_OR_DEPARTMENT_OTHER): Payer: BC Managed Care – PPO

## 2014-06-15 ENCOUNTER — Ambulatory Visit (HOSPITAL_BASED_OUTPATIENT_CLINIC_OR_DEPARTMENT_OTHER): Payer: BC Managed Care – PPO | Admitting: Hematology and Oncology

## 2014-06-15 ENCOUNTER — Other Ambulatory Visit (HOSPITAL_BASED_OUTPATIENT_CLINIC_OR_DEPARTMENT_OTHER): Payer: BC Managed Care – PPO

## 2014-06-15 VITALS — BP 133/84 | HR 95 | Temp 98.6°F | Resp 18 | Ht 64.0 in | Wt 191.0 lb

## 2014-06-15 DIAGNOSIS — C7951 Secondary malignant neoplasm of bone: Secondary | ICD-10-CM

## 2014-06-15 DIAGNOSIS — C787 Secondary malignant neoplasm of liver and intrahepatic bile duct: Secondary | ICD-10-CM

## 2014-06-15 DIAGNOSIS — C7889 Secondary malignant neoplasm of other digestive organs: Secondary | ICD-10-CM

## 2014-06-15 DIAGNOSIS — C50919 Malignant neoplasm of unspecified site of unspecified female breast: Secondary | ICD-10-CM

## 2014-06-15 DIAGNOSIS — C50412 Malignant neoplasm of upper-outer quadrant of left female breast: Secondary | ICD-10-CM

## 2014-06-15 DIAGNOSIS — Z5111 Encounter for antineoplastic chemotherapy: Secondary | ICD-10-CM

## 2014-06-15 DIAGNOSIS — R3989 Other symptoms and signs involving the genitourinary system: Secondary | ICD-10-CM

## 2014-06-15 LAB — COMPREHENSIVE METABOLIC PANEL (CC13)
ALK PHOS: 83 U/L (ref 40–150)
ALT: 20 U/L (ref 0–55)
AST: 21 U/L (ref 5–34)
Albumin: 3.4 g/dL — ABNORMAL LOW (ref 3.5–5.0)
Anion Gap: 10 mEq/L (ref 3–11)
BILIRUBIN TOTAL: 0.53 mg/dL (ref 0.20–1.20)
BUN: 9.1 mg/dL (ref 7.0–26.0)
CO2: 25 mEq/L (ref 22–29)
Calcium: 9.2 mg/dL (ref 8.4–10.4)
Chloride: 108 mEq/L (ref 98–109)
Creatinine: 0.8 mg/dL (ref 0.6–1.1)
GLUCOSE: 126 mg/dL (ref 70–140)
Potassium: 3.5 mEq/L (ref 3.5–5.1)
Sodium: 142 mEq/L (ref 136–145)
Total Protein: 7.3 g/dL (ref 6.4–8.3)

## 2014-06-15 LAB — URINALYSIS, MICROSCOPIC - CHCC
BILIRUBIN (URINE): NEGATIVE
GLUCOSE UR CHCC: NEGATIVE mg/dL
KETONES: NEGATIVE mg/dL
LEUKOCYTE ESTERASE: NEGATIVE
Nitrite: NEGATIVE
PH: 6 (ref 4.6–8.0)
Protein: NEGATIVE mg/dL
Specific Gravity, Urine: 1.03 (ref 1.003–1.035)
Urobilinogen, UR: 0.2 mg/dL (ref 0.2–1)

## 2014-06-15 LAB — CBC WITH DIFFERENTIAL/PLATELET
BASO%: 1.1 % (ref 0.0–2.0)
BASOS ABS: 0 10*3/uL (ref 0.0–0.1)
EOS ABS: 0.1 10*3/uL (ref 0.0–0.5)
EOS%: 3.7 % (ref 0.0–7.0)
HCT: 37.5 % (ref 34.8–46.6)
HEMOGLOBIN: 12 g/dL (ref 11.6–15.9)
LYMPH%: 18.7 % (ref 14.0–49.7)
MCH: 25.2 pg (ref 25.1–34.0)
MCHC: 32 g/dL (ref 31.5–36.0)
MCV: 78.7 fL — AB (ref 79.5–101.0)
MONO#: 0.4 10*3/uL (ref 0.1–0.9)
MONO%: 11.2 % (ref 0.0–14.0)
NEUT#: 2.3 10*3/uL (ref 1.5–6.5)
NEUT%: 65.3 % (ref 38.4–76.8)
PLATELETS: 210 10*3/uL (ref 145–400)
RBC: 4.76 10*6/uL (ref 3.70–5.45)
RDW: 15.6 % — ABNORMAL HIGH (ref 11.2–14.5)
WBC: 3.5 10*3/uL — ABNORMAL LOW (ref 3.9–10.3)
lymph#: 0.7 10*3/uL — ABNORMAL LOW (ref 0.9–3.3)

## 2014-06-15 MED ORDER — ONDANSETRON HCL 8 MG PO TABS: 8.0000 mg | ORAL_TABLET | Freq: Three times a day (TID) | ORAL | Status: DC | PRN

## 2014-06-15 MED ORDER — DENOSUMAB 120 MG/1.7ML ~~LOC~~ SOLN
120.0000 mg | Freq: Once | SUBCUTANEOUS | Status: AC
  Administered 2014-06-15: 120 mg via SUBCUTANEOUS
  Filled 2014-06-15: qty 1.7

## 2014-06-15 MED ORDER — FULVESTRANT 250 MG/5ML IM SOLN
500.0000 mg | INTRAMUSCULAR | Status: DC
  Administered 2014-06-15: 500 mg via INTRAMUSCULAR
  Filled 2014-06-15: qty 10

## 2014-06-15 NOTE — Assessment & Plan Note (Addendum)
Metastatic breast cancer with bone metastases, lung, liver metastases, spleen lesions, and pleural irregularity ER 100%, PR 3%, HER-2 negative Patient is starting therapy with Leslee Home, Faslodex, Xgeva starting today Our plan is to monitor her for metastatic breast cancer with scans in 2 months. Because Ibrance causes neutropenia in two thirds of patients, we will need to monitor her blood counts one week. Once again discussed risks and benefits of current treatment, patient is willing to proceed with treatment plan. Based on [facility] clinical trial, median PFS was 9.2 months for palbociclib plus fulvestrant and 3.8 months for placebo plus fulvestrant. Return to clinic in one week for toxicity evaluation and blood count check.

## 2014-06-15 NOTE — Progress Notes (Signed)
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 She also has suspicious bone lesions but      not easily accessible acc.to IR.  Therefore we are giving her the benefit      of the doubt and treating her with curative intent. MRI suggests nodal      involvement in the axilla.   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 Liver metastases   09/14/2012 Cancer Staging PET scan- multiple hypermetabolic areas left pectoral node the breast mass, left hilar, suprahilar, right hilar, right suprahilar, subcarinal lymph node, liver, right iliac, bilateral inguinal nodes, sternum lytic lesion    09/19/2012 Procedure Liver biopsy- Sarcoidosis   09/26/2012 - 01/10/2013 Chemotherapy AC x 4 followed by weekly Paclitaxel x 12. Marker response supraclavicular and mediastinal nodes decreasing the breast mass and liver lesions resolution of sternal activity   04/10/2013 Surgery Bilateral mastectomies, left decidual IDC grade 2 x 0.7 cm with high-grade DCIS LV I 8/11 lymph nodes positive with extracapsular extension ER 99% PR 40%, HER-2 negative. R: No cancer   04/12/2013 Procedure Genetic testing did not reveal BRCA mutations   05/22/2013 - 06/23/2013 Radiation Therapy Adjuvant radiation therapy with Xeloda   06/23/2013 -  Anti-estrogen oral therapy Tamoxifen 20 mg daily with Zoladex every 3 months    05/28/2014 Relapse/Recurrence Bony metastatic disease: Sternum biopsy metastatic carcinoma, HER-2 negative ratio 0.74   06/15/2014 -  Anti-estrogen oral therapy Ibrance, faslodex, xgeva    CHIEF COMPLIANT: Patient here to start treatment for metastatic breast cancer  INTERVAL HISTORY: Brenda Avery is a 43 year old African American lady with above-mentioned history of metastatic breast cancer. She is starting today on treatment with Faslodex, Delton See and Ibrance. She denies any new pain or other symptoms. Denies any cough expectoration fevers or chills.  REVIEW OF SYSTEMS:   Constitutional: Denies fevers, chills or abnormal weight loss Eyes: Denies blurriness of vision Ears, nose, mouth, throat, and face: Denies mucositis or sore throat Respiratory: Denies cough, dyspnea or wheezes Cardiovascular: Denies palpitation, chest discomfort or lower extremity swelling Gastrointestinal:  Denies nausea, heartburn or change in bowel habits Skin: Denies abnormal skin rashes Lymphatics: Denies new lymphadenopathy or easy bruising Neurological:Denies numbness, tingling or new weaknesses Behavioral/Psych: Mood is stable, no new changes  All other systems were reviewed with the patient and are negative.  I have reviewed the past medical history, past surgical history, social history and family history with the patient and they are unchanged from previous note.  ALLERGIES:  is allergic to metronidazole and sulfonamide derivatives.  MEDICATIONS:  Current Outpatient Prescriptions  Medication Sig Dispense Refill  . fluticasone (FLONASE) 50 MCG/ACT nasal spray Place 1-2 sprays into both nostrils once as needed for allergies or rhinitis.      Marland Kitchen ondansetron (ZOFRAN) 8 MG tablet Take 1 tablet (  8 mg total) by mouth every 8 (eight) hours as needed for nausea.  30 tablet  3  . palbociclib (IBRANCE) 125 MG capsule Take 1 capsule (125 mg total) by mouth daily with breakfast. Take whole with food for 21 days.  Then 7  days off to complete cycle.  21 capsule  3  . potassium chloride SA (K-DUR,KLOR-CON) 20 MEQ tablet Take 20 mEq by mouth 2 (two) times daily.      . tamoxifen (NOLVADEX) 20 MG tablet Take 20 mg by mouth every morning.       No current facility-administered medications for this visit.    PHYSICAL EXAMINATION: ECOG PERFORMANCE STATUS: 0 - Asymptomatic  There were no vitals filed for this visit. There were no vitals filed for this visit.  GENERAL:alert, no distress and comfortable SKIN: skin color, texture, turgor are normal, no rashes or significant lesions EYES: normal, Conjunctiva are pink and non-injected, sclera clear OROPHARYNX:no exudate, no erythema and lips, buccal mucosa, and tongue normal  NECK: supple, thyroid normal size, non-tender, without nodularity LYMPH:  no palpable lymphadenopathy in the cervical, axillary or inguinal LUNGS: clear to auscultation and percussion with normal breathing effort HEART: regular rate & rhythm and no murmurs and no lower extremity edema ABDOMEN:abdomen soft, non-tender and normal bowel sounds Musculoskeletal:no cyanosis of digits and no clubbing  NEURO: alert & oriented x 3 with fluent speech, no focal motor/sensory deficits   LABORATORY DATA:  I have reviewed the data as listed   Chemistry      Component Value Date/Time   NA 142 06/15/2014 0804   NA 138 04/12/2013 0715   K 3.5 06/15/2014 0804   K 3.3* 04/12/2013 0715   CL 104 04/12/2013 0715   CO2 25 06/15/2014 0804   CO2 25 04/12/2013 0715   BUN 9.1 06/15/2014 0804   BUN 8 04/12/2013 0715   CREATININE 0.8 06/15/2014 0804   CREATININE 0.69 04/12/2013 0715      Component Value Date/Time   CALCIUM 9.2 06/15/2014 0804   CALCIUM 8.8 04/12/2013 0715   ALKPHOS 83 06/15/2014 0804   ALKPHOS 112 04/04/2013 0927   AST 21 06/15/2014 0804   AST 30 04/04/2013 0927   ALT 20 06/15/2014 0804   ALT 31 04/04/2013 0927   BILITOT 0.53 06/15/2014 0804   BILITOT 0.5 04/04/2013 0927       Lab Results   Component Value Date   WBC 3.5* 06/15/2014   HGB 12.0 06/15/2014   HCT 37.5 06/15/2014   MCV 78.7* 06/15/2014   PLT 210 06/15/2014   NEUTROABS 2.3 06/15/2014     RADIOGRAPHIC STUDIES: I have personally reviewed the radiology reports and agreed with their findings. No results found.   ASSESSMENT & PLAN:  Breast carcinoma metastatic to multiple sites Metastatic breast cancer with bone metastases, lung, liver metastases, spleen lesions, and pleural irregularity ER 100%, PR 3%, HER-2 negative Patient is starting therapy with Leslee Home, Faslodex, Xgeva starting today Our plan is to monitor her for metastatic breast cancer with scans in 2 months. Because Ibrance causes neutropenia in two thirds of patients, we will need to monitor her blood counts one week. Once again discussed risks and benefits of current treatment, patient is willing to proceed with treatment plan. Based on [facility] clinical trial, median PFS was 9.2 months for palbociclib plus fulvestrant and 3.8 months for placebo plus fulvestrant. Return to clinic in one week for toxicity evaluation and blood count check.   urinary symptoms: We'll get a urinalysis today.  Orders Placed This Encounter  Procedures  . Urine culture    Standing Status: Future     Number of Occurrences:      Standing Expiration Date: 06/15/2015  . Urinalysis, Microscopic - [facility]    Standing Status: Future     Number of Occurrences:      Standing Expiration Date: 06/15/2015  . CBC with Differential    Through port after flushing it next week    Standing Status: Future     Number of Occurrences:      Standing Expiration Date: 06/15/2015  . Comprehensive metabolic panel (Cmet) - [facility]    Standing Status: Future     Number of Occurrences:      Standing Expiration Date: 06/15/2015  . Urinalysis, Routine w reflex microscopic    Standing Status: Standing     Number of Occurrences: 1     Standing Expiration Date:    The patient has a good  understanding of the overall plan. she agrees with it. She will call with any problems that may develop before her next visit here.  I spent 20 minutes counseling the patient face to face. The total time spent in the appointment was 25 minutes and more than 50% was on counseling and review of test results    Rulon Eisenmenger, MD 06/15/2014 8:49 AM

## 2014-06-15 NOTE — Telephone Encounter (Signed)
per pof to sch pt appt-gave pt copy of sch °

## 2014-06-15 NOTE — Patient Instructions (Signed)
Denosumab injection What is this medicine? DENOSUMAB (den oh sue mab) slows bone breakdown. Prolia is used to treat osteoporosis in women after menopause and in men. Xgeva is used to prevent bone fractures and other bone problems caused by cancer bone metastases. Xgeva is also used to treat giant cell tumor of the bone. This medicine may be used for other purposes; ask your health care provider or pharmacist if you have questions. COMMON BRAND NAME(S): Prolia, XGEVA What should I tell my health care provider before I take this medicine? They need to know if you have any of these conditions: -dental disease -eczema -infection or history of infections -kidney disease or on dialysis -low blood calcium or vitamin D -malabsorption syndrome -scheduled to have surgery or tooth extraction -taking medicine that contains denosumab -thyroid or parathyroid disease -an unusual reaction to denosumab, other medicines, foods, dyes, or preservatives -pregnant or trying to get pregnant -breast-feeding How should I use this medicine? This medicine is for injection under the skin. It is given by a health care professional in a hospital or clinic setting. If you are getting Prolia, a special MedGuide will be given to you by the pharmacist with each prescription and refill. Be sure to read this information carefully each time. For Prolia, talk to your pediatrician regarding the use of this medicine in children. Special care may be needed. For Xgeva, talk to your pediatrician regarding the use of this medicine in children. While this drug may be prescribed for children as young as 13 years for selected conditions, precautions do apply. Overdosage: If you think you've taken too much of this medicine contact a poison control center or emergency room at once. Overdosage: If you think you have taken too much of this medicine contact a poison control center or emergency room at once. NOTE: This medicine is only for  you. Do not share this medicine with others. What if I miss a dose? It is important not to miss your dose. Call your doctor or health care professional if you are unable to keep an appointment. What may interact with this medicine? Do not take this medicine with any of the following medications: -other medicines containing denosumab This medicine may also interact with the following medications: -medicines that suppress the immune system -medicines that treat cancer -steroid medicines like prednisone or cortisone This list may not describe all possible interactions. Give your health care provider a list of all the medicines, herbs, non-prescription drugs, or dietary supplements you use. Also tell them if you smoke, drink alcohol, or use illegal drugs. Some items may interact with your medicine. What should I watch for while using this medicine? Visit your doctor or health care professional for regular checks on your progress. Your doctor or health care professional may order blood tests and other tests to see how you are doing. Call your doctor or health care professional if you get a cold or other infection while receiving this medicine. Do not treat yourself. This medicine may decrease your body's ability to fight infection. You should make sure you get enough calcium and vitamin D while you are taking this medicine, unless your doctor tells you not to. Discuss the foods you eat and the vitamins you take with your health care professional. See your dentist regularly. Brush and floss your teeth as directed. Before you have any dental work done, tell your dentist you are receiving this medicine. Do not become pregnant while taking this medicine or for 5 months after stopping   it. Women should inform their doctor if they wish to become pregnant or think they might be pregnant. There is a potential for serious side effects to an unborn child. Talk to your health care professional or pharmacist for more  information. What side effects may I notice from receiving this medicine? Side effects that you should report to your doctor or health care professional as soon as possible: -allergic reactions like skin rash, itching or hives, swelling of the face, lips, or tongue -breathing problems -chest pain -fast, irregular heartbeat -feeling faint or lightheaded, falls -fever, chills, or any other sign of infection -muscle spasms, tightening, or twitches -numbness or tingling -skin blisters or bumps, or is dry, peels, or red -slow healing or unexplained pain in the mouth or jaw -unusual bleeding or bruising Side effects that usually do not require medical attention (Report these to your doctor or health care professional if they continue or are bothersome.): -muscle pain -stomach upset, gas This list may not describe all possible side effects. Call your doctor for medical advice about side effects. You may report side effects to FDA at 1-800-FDA-1088. Where should I keep my medicine? This medicine is only given in a clinic, doctor's office, or other health care setting and will not be stored at home. NOTE: This sheet is a summary. It may not cover all possible information. If you have questions about this medicine, talk to your doctor, pharmacist, or health care provider.  2015, Elsevier/Gold Standard. (2012-02-01 12:37:47)  Fulvestrant injection What is this medicine? FULVESTRANT (ful VES trant) blocks the effects of estrogen. It is used to treat breast cancer in women past the age of menopause. This medicine may be used for other purposes; ask your health care provider or pharmacist if you have questions. COMMON BRAND NAME(S): FASLODEX What should I tell my health care provider before I take this medicine? They need to know if you have any of these conditions: -bleeding problems -liver disease -low levels of platelets in the blood -an unusual or allergic reaction to fulvestrant, other  medicines, foods, dyes, or preservatives -pregnant or trying to get pregnant -breast-feeding How should I use this medicine? This medicine is for injection into a muscle. It is usually given by a health care professional in a hospital or clinic setting. Talk to your pediatrician regarding the use of this medicine in children. Special care may be needed. Overdosage: If you think you have taken too much of this medicine contact a poison control center or emergency room at once. NOTE: This medicine is only for you. Do not share this medicine with others. What if I miss a dose? It is important not to miss your dose. Call your doctor or health care professional if you are unable to keep an appointment. What may interact with this medicine? -medicines that treat or prevent blood clots like warfarin, enoxaparin, and dalteparin This list may not describe all possible interactions. Give your health care provider a list of all the medicines, herbs, non-prescription drugs, or dietary supplements you use. Also tell them if you smoke, drink alcohol, or use illegal drugs. Some items may interact with your medicine. What should I watch for while using this medicine? Your condition will be monitored carefully while you are receiving this medicine. You will need important blood work done while you are taking this medicine. Do not become pregnant while taking this medicine. Women should inform their doctor if they wish to become pregnant or think they might be pregnant. There is   a potential for serious side effects to an unborn child. Talk to your health care professional or pharmacist for more information. What side effects may I notice from receiving this medicine? Side effects that you should report to your doctor or health care professional as soon as possible: -allergic reactions like skin rash, itching or hives, swelling of the face, lips, or tongue -feeling faint or lightheaded, falls -fever or flu-like  symptoms -sore throat -vaginal bleeding Side effects that usually do not require medical attention (report to your doctor or health care professional if they continue or are bothersome): -aches, pains -constipation or diarrhea -headache -hot flashes -nausea, vomiting -pain at site where injected -stomach pain This list may not describe all possible side effects. Call your doctor for medical advice about side effects. You may report side effects to FDA at 1-800-FDA-1088. Where should I keep my medicine? This drug is given in a hospital or clinic and will not be stored at home. NOTE: This sheet is a summary. It may not cover all possible information. If you have questions about this medicine, talk to your doctor, pharmacist, or health care provider.  2015, Elsevier/Gold Standard. (2007-12-12 15:39:24)  

## 2014-06-16 LAB — URINE CULTURE

## 2014-06-22 ENCOUNTER — Ambulatory Visit (HOSPITAL_BASED_OUTPATIENT_CLINIC_OR_DEPARTMENT_OTHER): Payer: BC Managed Care – PPO | Admitting: Nurse Practitioner

## 2014-06-22 ENCOUNTER — Other Ambulatory Visit (HOSPITAL_BASED_OUTPATIENT_CLINIC_OR_DEPARTMENT_OTHER): Payer: BC Managed Care – PPO

## 2014-06-22 ENCOUNTER — Telehealth: Payer: Self-pay | Admitting: *Deleted

## 2014-06-22 ENCOUNTER — Telehealth: Payer: Self-pay | Admitting: Nurse Practitioner

## 2014-06-22 ENCOUNTER — Encounter: Payer: Self-pay | Admitting: Nurse Practitioner

## 2014-06-22 ENCOUNTER — Ambulatory Visit: Payer: Self-pay

## 2014-06-22 VITALS — BP 133/78 | HR 99 | Temp 98.2°F | Resp 18 | Ht 64.0 in | Wt 191.4 lb

## 2014-06-22 DIAGNOSIS — C773 Secondary and unspecified malignant neoplasm of axilla and upper limb lymph nodes: Secondary | ICD-10-CM

## 2014-06-22 DIAGNOSIS — C50412 Malignant neoplasm of upper-outer quadrant of left female breast: Secondary | ICD-10-CM

## 2014-06-22 DIAGNOSIS — R350 Frequency of micturition: Secondary | ICD-10-CM

## 2014-06-22 DIAGNOSIS — C7951 Secondary malignant neoplasm of bone: Secondary | ICD-10-CM

## 2014-06-22 DIAGNOSIS — C50919 Malignant neoplasm of unspecified site of unspecified female breast: Secondary | ICD-10-CM

## 2014-06-22 LAB — CBC WITH DIFFERENTIAL/PLATELET
BASO%: 1 % (ref 0.0–2.0)
Basophils Absolute: 0 10*3/uL (ref 0.0–0.1)
EOS%: 4.6 % (ref 0.0–7.0)
Eosinophils Absolute: 0.1 10*3/uL (ref 0.0–0.5)
HEMATOCRIT: 36.8 % (ref 34.8–46.6)
HGB: 11.6 g/dL (ref 11.6–15.9)
LYMPH%: 29.4 % (ref 14.0–49.7)
MCH: 24.7 pg — AB (ref 25.1–34.0)
MCHC: 31.5 g/dL (ref 31.5–36.0)
MCV: 78.5 fL — AB (ref 79.5–101.0)
MONO#: 0.1 10*3/uL (ref 0.1–0.9)
MONO%: 3.3 % (ref 0.0–14.0)
NEUT#: 1.4 10*3/uL — ABNORMAL LOW (ref 1.5–6.5)
NEUT%: 61.7 % (ref 38.4–76.8)
Platelets: 230 10*3/uL (ref 145–400)
RBC: 4.69 10*6/uL (ref 3.70–5.45)
RDW: 15.5 % — ABNORMAL HIGH (ref 11.2–14.5)
WBC: 2.2 10*3/uL — AB (ref 3.9–10.3)
lymph#: 0.7 10*3/uL — ABNORMAL LOW (ref 0.9–3.3)

## 2014-06-22 LAB — COMPREHENSIVE METABOLIC PANEL (CC13)
ALK PHOS: 86 U/L (ref 40–150)
ALT: 19 U/L (ref 0–55)
AST: 17 U/L (ref 5–34)
Albumin: 3.4 g/dL — ABNORMAL LOW (ref 3.5–5.0)
Anion Gap: 9 mEq/L (ref 3–11)
BILIRUBIN TOTAL: 0.54 mg/dL (ref 0.20–1.20)
BUN: 8.3 mg/dL (ref 7.0–26.0)
CO2: 24 mEq/L (ref 22–29)
Calcium: 8.4 mg/dL (ref 8.4–10.4)
Chloride: 108 mEq/L (ref 98–109)
Creatinine: 0.8 mg/dL (ref 0.6–1.1)
Glucose: 124 mg/dl (ref 70–140)
Potassium: 3.2 mEq/L — ABNORMAL LOW (ref 3.5–5.1)
SODIUM: 141 meq/L (ref 136–145)
TOTAL PROTEIN: 7.3 g/dL (ref 6.4–8.3)

## 2014-06-22 LAB — URINALYSIS, MICROSCOPIC - CHCC
Bilirubin (Urine): NEGATIVE
Glucose: NEGATIVE mg/dL
Ketones: NEGATIVE mg/dL
Leukocyte Esterase: NEGATIVE
Nitrite: NEGATIVE
PROTEIN: NEGATIVE mg/dL
Specific Gravity, Urine: 1.01 (ref 1.003–1.035)
UROBILINOGEN UR: 0.2 mg/dL (ref 0.2–1)
pH: 6 (ref 4.6–8.0)

## 2014-06-22 MED ORDER — LIDOCAINE-PRILOCAINE 2.5-2.5 % EX CREA: 1.0000 "application " | TOPICAL_CREAM | CUTANEOUS | Status: AC | PRN

## 2014-06-22 MED ORDER — HEPARIN SOD (PORK) LOCK FLUSH 100 UNIT/ML IV SOLN
500.0000 [IU] | Freq: Once | INTRAVENOUS | Status: AC
  Administered 2014-06-22: 500 [IU] via INTRAVENOUS
  Filled 2014-06-22: qty 5

## 2014-06-22 MED ORDER — CIPROFLOXACIN HCL 500 MG PO TABS: 500.0000 mg | ORAL_TABLET | Freq: Two times a day (BID) | ORAL | Status: DC

## 2014-06-22 MED ORDER — SODIUM CHLORIDE 0.9 % IJ SOLN
10.0000 mL | INTRAMUSCULAR | Status: DC | PRN
  Administered 2014-06-22: 10 mL via INTRAVENOUS
  Filled 2014-06-22: qty 10

## 2014-06-22 NOTE — Telephone Encounter (Signed)
Called to inform pt of urinalysis results. Informed pt that Christeen Douglas has called in Cipro 500 mg to be taken BID x 7 days. Disp-14, R-0. I also called in Emla cream for when pt comes to have her port flushed.  Pt verbalized understanding. No further concerns. Message to be forwarded to [facility].

## 2014-06-22 NOTE — Assessment & Plan Note (Addendum)
Metastatic breast cancer with bone metastases, lung, liver metastases, spleen lesions, and pleural irregularity ER 100%, PR 3%, HER-2 negative. Patient is being treated with Darrick Meigs, Xgeva, starting 06/15/14.  Shanelle is doing well today. The labs were reviewed in detail, and in just 7 days her [place] is down to 1.4. At this time she will hold the palbociclib and we will recheck her counts in 1 week. If her [place] has rebounded we will advise her to restart this drug. If she has not heard from [place] by the afternoon of her next lab appointment, she will call us.  She will continue her faslodex and xgeva injections as scheduled. I ordered her next PET scan to be performed in 2 months per Dr. Geralyn Flash last progress note. Mieka will return to clinic on 11/23 to follow up with Dr. Lindi Adie regarding her progress on the palbociclib. She will have labs checked on every Monday.  She still has a port and this will be flushed today. I have placed orders for it to be flushed every 6 weeks in the future.

## 2014-06-22 NOTE — Telephone Encounter (Signed)
Opened in error

## 2014-06-22 NOTE — Assessment & Plan Note (Signed)
Urine collected last week showed multiple bacteria growths and microscopic blood, prompting my order to have the sample recollected this week. The urine culture collected this morning is not back, but the microanalysis still shows blood and the patient continues to have urinary frequency. I am going to start her on cipro 500mg  BIDx7 in the meantime. If the bacteria grown on this week's culture is not sensitive to this drug, we will switch to macrobid.

## 2014-06-22 NOTE — Telephone Encounter (Signed)
, °

## 2014-06-22 NOTE — Progress Notes (Signed)
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 She also has suspicious bone lesions but      not easily accessible acc.to IR.  Therefore we are giving her the benefit      of the doubt and treating her with curative intent. MRI suggests nodal      involvement in the axilla.   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 Liver metastases   09/14/2012 Cancer Staging PET scan- multiple hypermetabolic areas left pectoral node the breast mass, left hilar, suprahilar, right hilar, right suprahilar, subcarinal lymph node, liver, right iliac, bilateral inguinal nodes, sternum lytic lesion    09/19/2012 Procedure Liver biopsy- Sarcoidosis   09/26/2012 - 01/10/2013 Chemotherapy AC x 4 followed by weekly Paclitaxel x 12. Marker response supraclavicular and mediastinal nodes decreasing the breast mass and liver lesions resolution of sternal activity   04/10/2013 Surgery Bilateral mastectomies, left decidual IDC grade 2 x 0.7 cm with high-grade DCIS LV I 8/11 lymph nodes positive with extracapsular extension ER 99% PR 40%, HER-2 negative. R: No cancer   04/12/2013 Procedure Genetic testing did not reveal BRCA mutations   05/22/2013 - 06/23/2013 Radiation Therapy Adjuvant radiation therapy with Xeloda   06/23/2013 -  Anti-estrogen oral therapy Tamoxifen 20 mg daily with Zoladex every 3 months    05/28/2014 Relapse/Recurrence Bony metastatic disease: Sternum biopsy metastatic carcinoma, HER-2 negative ratio 0.74   06/15/2014 -  Anti-estrogen oral therapy Ibrance, faslodex, xgeva    CHIEF COMPLIANT: Patient here to start treatment for metastatic breast cancer  INTERVAL HISTORY: Brenda Avery is a 43 year old African American lady with above-mentioned history of metastatic breast cancer. On 10/30 she began treatment with Faslodex, Delton See and Ibrance. Her current dose of the ibrance is 153m daily. Today is day 8 of her 21 day cycle of this drug, then she has 7 days off. She is tolerating all treatments well and has no side effects that she is aware of. She denies fevers, chills, nausea, vomiting, or changes in bowel habits. She continues to have urinary frequency, but denies bladder spasms or dysuria. She has no headaches, dizziness, mouth sores or rashes. She denies shortness of breath, chest pain, cough, palpitations, or fatigue. A detailed review of systems is otherwise noncontributory. She is in need of a port flush today.   REVIEW OF SYSTEMS:   A detailed review of systems is otherwise noncontributory.  I have reviewed the past medical history, past surgical history, social history and family history with the patient and they are unchanged from previous note.  ALLERGIES:  is allergic to metronidazole and sulfonamide derivatives.  MEDICATIONS:  Current Outpatient Prescriptions  Medication Sig Dispense Refill  . palbociclib (IBRANCE) 125 MG capsule Take 1 capsule (125 mg total) by mouth daily with breakfast. Take whole with food for 21 days.  Then 7 days off to complete cycle. 21 capsule 3  . potassium chloride SA (K-DUR,KLOR-CON) 20 MEQ tablet Take 20 mEq by mouth 2 (two) times daily.    . ciprofloxacin (CIPRO) 500 MG tablet Take 1 tablet (500 mg total) by mouth 2 (two) times daily. For 7 days 14 tablet 0  . fluticasone (FLONASE) 50 MCG/ACT nasal spray Place 1-2 sprays into both  nostrils once as needed for allergies or rhinitis.    Marland Kitchen ondansetron (ZOFRAN) 8 MG tablet Take 1 tablet (8 mg total) by mouth every 8 (eight) hours as needed for nausea. 30 tablet 3  . tamoxifen (NOLVADEX) 20 MG tablet Take 20 mg by mouth every morning.     Current Facility-Administered Medications  Medication Dose Route Frequency Provider Last Rate Last Dose  . sodium chloride 0.9 % injection 10 mL  10 mL Intravenous PRN Marcelino Duster, NP   10 mL at 06/22/14 1007    PHYSICAL EXAMINATION: ECOG PERFORMANCE STATUS: 0 - Asymptomatic  Filed Vitals:   06/22/14 0906  BP: 133/78  Pulse: 99  Temp: 98.2 F (36.8 C)  Resp: 18   Filed Weights   06/22/14 0906  Weight: 191 lb 6.4 oz (86.818 kg)    Skin: warm, dry  HEENT: sclerae anicteric, conjunctivae pink, oropharynx clear. No thrush or mucositis.  Lymph Nodes: No cervical or supraclavicular lymphadenopathy  Lungs: clear to auscultation bilaterally, no rales, wheezes, or rhonci  Heart: regular rate and rhythm  Abdomen: round, soft, non tender, positive bowel sounds  Musculoskeletal: No focal spinal tenderness, no peripheral edema  Neuro: non focal, well oriented, positive affect  Breasts: deferred   LABORATORY DATA:  I have reviewed the data as listed   Chemistry      Component Value Date/Time   NA 141 06/22/2014 0853   NA 138 04/12/2013 0715   K 3.2* 06/22/2014 0853   K 3.3* 04/12/2013 0715   CL 104 04/12/2013 0715   CO2 24 06/22/2014 0853   CO2 25 04/12/2013 0715   BUN 8.3 06/22/2014 0853   BUN 8 04/12/2013 0715   CREATININE 0.8 06/22/2014 0853   CREATININE 0.69 04/12/2013 0715      Component Value Date/Time   CALCIUM 8.4 06/22/2014 0853   CALCIUM 8.8 04/12/2013 0715   ALKPHOS 86 06/22/2014 0853   ALKPHOS 112 04/04/2013 0927   AST 17 06/22/2014 0853   AST 30 04/04/2013 0927   ALT 19 06/22/2014 0853   ALT 31 04/04/2013 0927   BILITOT 0.54 06/22/2014 0853   BILITOT 0.5 04/04/2013 0927       Lab Results   Component Value Date   WBC 2.2* 06/22/2014   HGB 11.6 06/22/2014   HCT 36.8 06/22/2014   MCV 78.5* 06/22/2014   PLT 230 06/22/2014   NEUTROABS 1.4* 06/22/2014     RADIOGRAPHIC STUDIES: I have personally reviewed the radiology reports and agreed with their findings. No results found.   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 Patient is being treated with Darrick Meigs, Xgeva, starting 06/15/14.  Haisley is doing well today. The labs were reviewed in detail, and in just 7 days her [place] is down to 1.4. At this time she will hold the palbociclib and we will recheck her counts in 1 week. If her [place] has rebounded we will advise her to restart this drug. If she  has not heard from [place] by the afternoon of her next lab appointment, she will call us.  She will continue her faslodex and xgeva injections as scheduled. I ordered her next PET scan to be performed in 2 months per Dr. Geralyn Flash last progress note. Kasee will return to clinic on 11/23 to follow up with Dr. Lindi Adie regarding her progress on the palbociclib. She will have labs checked on every Monday.  She still has a port and this will be flushed today. I have placed orders for it to be flushed every 6 weeks in the future.   Urinary frequency Urine collected last week showed multiple bacteria growths and microscopic blood, prompting my order to have the sample recollected this week. The urine culture collected this morning is not back, but the microanalysis still shows blood and the patient continues to have urinary frequency. I am going to start her on cipro 539m BIDx7 in the meantime. If the bacteria grown on this week's culture is not sensitive to this drug, we will switch to macrobid.     Orders Placed This Encounter  Procedures  . Urine culture    Standing Status:  Future     Number of Occurrences: 1     Standing Expiration Date: 06/22/2015  . NM PET Image Restag (PS) Skull Base To Thigh    Standing Status: Future     Number of Occurrences:      Standing Expiration Date: 06/22/2015    Order Specific Question:  Reason for Exam (SYMPTOM  OR DIAGNOSIS REQUIRED)    Answer:  metastatic breast cancer    Order Specific Question:  Is the patient pregnant?    Answer:  No    Order Specific Question:  Preferred imaging [facility]?    [facility]    Standing Status: Standing     Number of Occurrences: 3     Standing Expiration Date: 06/23/2015  . Comprehensive metabolic panel    Standing Status: Standing     Number of Occurrences: 3     Standing Expiration Date: 06/23/2015  . Urinalysis with microscopic - [facility]    Standing Status: Future     Number of Occurrences: 1     Standing Expiration Date: 06/22/2015  . Schedule Portacath Flush Appointment    Schedule Portacath flush appointment   The patient has a good understanding of the overall plan. she agrees with it. She will call with any problems that may develop before her next visit here.  I spent 20 minutes counseling the patient face to face. The total time spent in the appointment was 25 minutes and more than 50% was on counseling and review of test results    FMarcelino Duster NP 06/22/2014 1:12 PM

## 2014-06-23 LAB — URINE CULTURE

## 2014-07-02 ENCOUNTER — Other Ambulatory Visit: Payer: Self-pay | Admitting: Nurse Practitioner

## 2014-07-02 ENCOUNTER — Ambulatory Visit (HOSPITAL_BASED_OUTPATIENT_CLINIC_OR_DEPARTMENT_OTHER): Payer: BC Managed Care – PPO

## 2014-07-02 ENCOUNTER — Telehealth: Payer: Self-pay | Admitting: Nurse Practitioner

## 2014-07-02 ENCOUNTER — Telehealth: Payer: Self-pay | Admitting: Hematology and Oncology

## 2014-07-02 ENCOUNTER — Other Ambulatory Visit (HOSPITAL_BASED_OUTPATIENT_CLINIC_OR_DEPARTMENT_OTHER): Payer: BC Managed Care – PPO

## 2014-07-02 DIAGNOSIS — C787 Secondary malignant neoplasm of liver and intrahepatic bile duct: Secondary | ICD-10-CM

## 2014-07-02 DIAGNOSIS — Z5111 Encounter for antineoplastic chemotherapy: Secondary | ICD-10-CM

## 2014-07-02 DIAGNOSIS — C50919 Malignant neoplasm of unspecified site of unspecified female breast: Secondary | ICD-10-CM

## 2014-07-02 DIAGNOSIS — C7951 Secondary malignant neoplasm of bone: Secondary | ICD-10-CM

## 2014-07-02 DIAGNOSIS — C50412 Malignant neoplasm of upper-outer quadrant of left female breast: Secondary | ICD-10-CM

## 2014-07-02 LAB — COMPREHENSIVE METABOLIC PANEL (CC13)
ALT: 21 U/L (ref 0–55)
AST: 21 U/L (ref 5–34)
Albumin: 3.4 g/dL — ABNORMAL LOW (ref 3.5–5.0)
Alkaline Phosphatase: 89 U/L (ref 40–150)
Anion Gap: 9 mEq/L (ref 3–11)
BUN: 11.7 mg/dL (ref 7.0–26.0)
CHLORIDE: 106 meq/L (ref 98–109)
CO2: 26 mEq/L (ref 22–29)
CREATININE: 0.8 mg/dL (ref 0.6–1.1)
Calcium: 9.1 mg/dL (ref 8.4–10.4)
GLUCOSE: 124 mg/dL (ref 70–140)
Potassium: 3.6 mEq/L (ref 3.5–5.1)
Sodium: 140 mEq/L (ref 136–145)
Total Bilirubin: 0.43 mg/dL (ref 0.20–1.20)
Total Protein: 7.1 g/dL (ref 6.4–8.3)

## 2014-07-02 LAB — CBC WITH DIFFERENTIAL/PLATELET
BASO%: 0.9 % (ref 0.0–2.0)
BASOS ABS: 0 10*3/uL (ref 0.0–0.1)
EOS ABS: 0.1 10*3/uL (ref 0.0–0.5)
EOS%: 3.1 % (ref 0.0–7.0)
HEMATOCRIT: 36.4 % (ref 34.8–46.6)
HEMOGLOBIN: 11.5 g/dL — AB (ref 11.6–15.9)
LYMPH#: 0.7 10*3/uL — AB (ref 0.9–3.3)
LYMPH%: 33.3 % (ref 14.0–49.7)
MCH: 25.4 pg (ref 25.1–34.0)
MCHC: 31.7 g/dL (ref 31.5–36.0)
MCV: 80 fL (ref 79.5–101.0)
MONO#: 0.4 10*3/uL (ref 0.1–0.9)
MONO%: 20.4 % — ABNORMAL HIGH (ref 0.0–14.0)
NEUT#: 0.8 10*3/uL — ABNORMAL LOW (ref 1.5–6.5)
NEUT%: 42.3 % (ref 38.4–76.8)
Platelets: 148 10*3/uL (ref 145–400)
RBC: 4.55 10*6/uL (ref 3.70–5.45)
RDW: 16.4 % — ABNORMAL HIGH (ref 11.2–14.5)
WBC: 2 10*3/uL — ABNORMAL LOW (ref 3.9–10.3)

## 2014-07-02 MED ORDER — FULVESTRANT 250 MG/5ML IM SOLN
500.0000 mg | INTRAMUSCULAR | Status: DC
  Administered 2014-07-02: 500 mg via INTRAMUSCULAR
  Filled 2014-07-02: qty 10

## 2014-07-02 NOTE — Telephone Encounter (Signed)
s.w. pt and advise don 11.17 inj....ok and aware

## 2014-07-02 NOTE — Telephone Encounter (Signed)
Reviewed labs. Despite holding palbociclib since 11/6, ANC is down to 0.8 this week. Consulted with Dr. Lindi Adie. He advised to continue holding drug until office visit next Monday and neulasta x1 tomorrow. Patient called at 1:30pm on cell phone. Information relayed. Patient confirms understanding. POF entered for neulasta x1 tomorrow.

## 2014-07-02 NOTE — Patient Instructions (Signed)
Fulvestrant injection  What is this medicine?  FULVESTRANT (ful VES trant) blocks the effects of estrogen. It is used to treat breast cancer in women past the age of menopause.  This medicine may be used for other purposes; ask your health care provider or pharmacist if you have questions.  COMMON BRAND NAME(S): FASLODEX  What should I tell my health care provider before I take this medicine?  They need to know if you have any of these conditions:  -bleeding problems  -liver disease  -low levels of platelets in the blood  -an unusual or allergic reaction to fulvestrant, other medicines, foods, dyes, or preservatives  -pregnant or trying to get pregnant  -breast-feeding  How should I use this medicine?  This medicine is for injection into a muscle. It is usually given by a health care professional in a hospital or clinic setting.  Talk to your pediatrician regarding the use of this medicine in children. Special care may be needed.  Overdosage: If you think you have taken too much of this medicine contact a poison control center or emergency room at once.  NOTE: This medicine is only for you. Do not share this medicine with others.  What if I miss a dose?  It is important not to miss your dose. Call your doctor or health care professional if you are unable to keep an appointment.  What may interact with this medicine?  -medicines that treat or prevent blood clots like warfarin, enoxaparin, and dalteparin  This list may not describe all possible interactions. Give your health care provider a list of all the medicines, herbs, non-prescription drugs, or dietary supplements you use. Also tell them if you smoke, drink alcohol, or use illegal drugs. Some items may interact with your medicine.  What should I watch for while using this medicine?  Your condition will be monitored carefully while you are receiving this medicine. You will need important blood work done while you are taking this medicine.  Do not become pregnant  while taking this medicine. Women should inform their doctor if they wish to become pregnant or think they might be pregnant. There is a potential for serious side effects to an unborn child. Talk to your health care professional or pharmacist for more information.  What side effects may I notice from receiving this medicine?  Side effects that you should report to your doctor or health care professional as soon as possible:  -allergic reactions like skin rash, itching or hives, swelling of the face, lips, or tongue  -feeling faint or lightheaded, falls  -fever or flu-like symptoms  -sore throat  -vaginal bleeding  Side effects that usually do not require medical attention (report to your doctor or health care professional if they continue or are bothersome):  -aches, pains  -constipation or diarrhea  -headache  -hot flashes  -nausea, vomiting  -pain at site where injected  -stomach pain  This list may not describe all possible side effects. Call your doctor for medical advice about side effects. You may report side effects to FDA at 1-800-FDA-1088.  Where should I keep my medicine?  This drug is given in a hospital or clinic and will not be stored at home.  NOTE: This sheet is a summary. It may not cover all possible information. If you have questions about this medicine, talk to your doctor, pharmacist, or health care provider.   2015, [employer]vier/Gold Standard. (2007-12-12 15:39:24)

## 2014-07-03 ENCOUNTER — Ambulatory Visit (HOSPITAL_BASED_OUTPATIENT_CLINIC_OR_DEPARTMENT_OTHER): Payer: BC Managed Care – PPO

## 2014-07-03 DIAGNOSIS — C50919 Malignant neoplasm of unspecified site of unspecified female breast: Secondary | ICD-10-CM

## 2014-07-03 DIAGNOSIS — Z5189 Encounter for other specified aftercare: Secondary | ICD-10-CM

## 2014-07-03 DIAGNOSIS — C50412 Malignant neoplasm of upper-outer quadrant of left female breast: Secondary | ICD-10-CM

## 2014-07-03 MED ORDER — PEGFILGRASTIM INJECTION 6 MG/0.6ML ~~LOC~~
6.0000 mg | PREFILLED_SYRINGE | Freq: Once | SUBCUTANEOUS | Status: AC
  Administered 2014-07-03: 6 mg via SUBCUTANEOUS
  Filled 2014-07-03: qty 0.6

## 2014-07-03 NOTE — Patient Instructions (Signed)
Pegfilgrastim injection What is this medicine? PEGFILGRASTIM (peg fil GRA stim) is a long-acting granulocyte colony-stimulating factor that stimulates the growth of neutrophils, a type of white blood cell important in the body's fight against infection. It is used to reduce the incidence of fever and infection in patients with certain types of cancer who are receiving chemotherapy that affects the bone marrow. This medicine may be used for other purposes; ask your health care provider or pharmacist if you have questions. COMMON BRAND NAME(S): Neulasta What should I tell my health care provider before I take this medicine? They need to know if you have any of these conditions: -latex allergy -ongoing radiation therapy -sickle cell disease -skin reactions to acrylic adhesives (On-Body Injector only) -an unusual or allergic reaction to pegfilgrastim, filgrastim, other medicines, foods, dyes, or preservatives -pregnant or trying to get pregnant -breast-feeding How should I use this medicine? This medicine is for injection under the skin. If you get this medicine at home, you will be taught how to prepare and give the pre-filled syringe or how to use the On-body Injector. Refer to the patient Instructions for Use for detailed instructions. Use exactly as directed. Take your medicine at regular intervals. Do not take your medicine more often than directed. It is important that you put your used needles and syringes in a special sharps container. Do not put them in a trash can. If you do not have a sharps container, call your pharmacist or healthcare provider to get one. Talk to your pediatrician regarding the use of this medicine in children. Special care may be needed. Overdosage: If you think you have taken too much of this medicine contact a poison control center or emergency room at once. NOTE: This medicine is only for you. Do not share this medicine with others. What if I miss a dose? It is  important not to miss your dose. Call your doctor or health care professional if you miss your dose. If you miss a dose due to an On-body Injector failure or leakage, a new dose should be administered as soon as possible using a single prefilled syringe for manual use. What may interact with this medicine? Interactions have not been studied. Give your health care provider a list of all the medicines, herbs, non-prescription drugs, or dietary supplements you use. Also tell them if you smoke, drink alcohol, or use illegal drugs. Some items may interact with your medicine. This list may not describe all possible interactions. Give your health care provider a list of all the medicines, herbs, non-prescription drugs, or dietary supplements you use. Also tell them if you smoke, drink alcohol, or use illegal drugs. Some items may interact with your medicine. What should I watch for while using this medicine? You may need blood work done while you are taking this medicine. If you are going to need a MRI, CT scan, or other procedure, tell your doctor that you are using this medicine (On-Body Injector only). What side effects may I notice from receiving this medicine? Side effects that you should report to your doctor or health care professional as soon as possible: -allergic reactions like skin rash, itching or hives, swelling of the face, lips, or tongue -dizziness -fever -pain, redness, or irritation at site where injected -pinpoint red spots on the skin -shortness of breath or breathing problems -stomach or side pain, or pain at the shoulder -swelling -tiredness -trouble passing urine Side effects that usually do not require medical attention (report to your doctor   or health care professional if they continue or are bothersome): -bone pain -muscle pain This list may not describe all possible side effects. Call your doctor for medical advice about side effects. You may report side effects to FDA at  1-800-FDA-1088. Where should I keep my medicine? Keep out of the reach of children. Store pre-filled syringes in a refrigerator between 2 and 8 degrees C (36 and 46 degrees F). Do not freeze. Keep in carton to protect from light. Throw away this medicine if it is left out of the refrigerator for more than 48 hours. Throw away any unused medicine after the expiration date. NOTE: This sheet is a summary. It may not cover all possible information. If you have questions about this medicine, talk to your doctor, pharmacist, or health care provider.  2015, [employer]vier/Gold Standard. (2013-11-02 16:14:05)  

## 2014-07-04 ENCOUNTER — Other Ambulatory Visit: Payer: Self-pay | Admitting: *Deleted

## 2014-07-04 DIAGNOSIS — C50919 Malignant neoplasm of unspecified site of unspecified female breast: Secondary | ICD-10-CM

## 2014-07-04 MED ORDER — POTASSIUM CHLORIDE CRYS ER 20 MEQ PO TBCR: 20.0000 meq | EXTENDED_RELEASE_TABLET | Freq: Two times a day (BID) | ORAL | Status: DC

## 2014-07-09 ENCOUNTER — Ambulatory Visit (HOSPITAL_BASED_OUTPATIENT_CLINIC_OR_DEPARTMENT_OTHER): Payer: BC Managed Care – PPO | Admitting: Hematology and Oncology

## 2014-07-09 ENCOUNTER — Telehealth: Payer: Self-pay | Admitting: Hematology and Oncology

## 2014-07-09 ENCOUNTER — Other Ambulatory Visit (HOSPITAL_BASED_OUTPATIENT_CLINIC_OR_DEPARTMENT_OTHER): Payer: BC Managed Care – PPO

## 2014-07-09 VITALS — BP 150/86 | HR 105 | Temp 98.2°F | Resp 18 | Ht 64.0 in | Wt 193.8 lb

## 2014-07-09 DIAGNOSIS — D739 Disease of spleen, unspecified: Secondary | ICD-10-CM

## 2014-07-09 DIAGNOSIS — C787 Secondary malignant neoplasm of liver and intrahepatic bile duct: Secondary | ICD-10-CM

## 2014-07-09 DIAGNOSIS — C7951 Secondary malignant neoplasm of bone: Secondary | ICD-10-CM

## 2014-07-09 DIAGNOSIS — C50919 Malignant neoplasm of unspecified site of unspecified female breast: Secondary | ICD-10-CM

## 2014-07-09 DIAGNOSIS — C7800 Secondary malignant neoplasm of unspecified lung: Secondary | ICD-10-CM

## 2014-07-09 DIAGNOSIS — C50411 Malignant neoplasm of upper-outer quadrant of right female breast: Secondary | ICD-10-CM

## 2014-07-09 LAB — CBC WITH DIFFERENTIAL/PLATELET
BASO%: 0.4 % (ref 0.0–2.0)
Basophils Absolute: 0 10*3/uL (ref 0.0–0.1)
EOS%: 1.1 % (ref 0.0–7.0)
Eosinophils Absolute: 0.1 10*3/uL (ref 0.0–0.5)
HCT: 36.2 % (ref 34.8–46.6)
HGB: 11.6 g/dL (ref 11.6–15.9)
LYMPH%: 16 % (ref 14.0–49.7)
MCH: 25.4 pg (ref 25.1–34.0)
MCHC: 32 g/dL (ref 31.5–36.0)
MCV: 79.4 fL — AB (ref 79.5–101.0)
MONO#: 0.9 10*3/uL (ref 0.1–0.9)
MONO%: 10.1 % (ref 0.0–14.0)
NEUT#: 6.2 10*3/uL (ref 1.5–6.5)
NEUT%: 72.4 % (ref 38.4–76.8)
PLATELETS: 182 10*3/uL (ref 145–400)
RBC: 4.56 10*6/uL (ref 3.70–5.45)
RDW: 16.8 % — AB (ref 11.2–14.5)
WBC: 8.5 10*3/uL (ref 3.9–10.3)
lymph#: 1.4 10*3/uL (ref 0.9–3.3)

## 2014-07-09 LAB — COMPREHENSIVE METABOLIC PANEL (CC13)
ALK PHOS: 123 U/L (ref 40–150)
ALT: 20 U/L (ref 0–55)
AST: 24 U/L (ref 5–34)
Albumin: 3.5 g/dL (ref 3.5–5.0)
Anion Gap: 11 mEq/L (ref 3–11)
BILIRUBIN TOTAL: 0.32 mg/dL (ref 0.20–1.20)
BUN: 7.5 mg/dL (ref 7.0–26.0)
CO2: 24 mEq/L (ref 22–29)
Calcium: 9 mg/dL (ref 8.4–10.4)
Chloride: 107 mEq/L (ref 98–109)
Creatinine: 0.8 mg/dL (ref 0.6–1.1)
Glucose: 135 mg/dl (ref 70–140)
POTASSIUM: 3.7 meq/L (ref 3.5–5.1)
SODIUM: 141 meq/L (ref 136–145)
TOTAL PROTEIN: 7.3 g/dL (ref 6.4–8.3)

## 2014-07-09 MED ORDER — PALBOCICLIB 100 MG PO CAPS: 100.0000 mg | ORAL_CAPSULE | Freq: Every day | ORAL | Status: DC

## 2014-07-09 NOTE — Progress Notes (Signed)
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 She also has suspicious bone lesions but not easily accessible acc.to IR.  Therefore we are giving her the benefit of the doubt and treating her with curative intent. MRI suggests nodal involvement in the axilla.        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 Liver metastases   09/14/2012 Cancer Staging PET scan- multiple hypermetabolic areas left pectoral node the breast mass, left hilar, suprahilar, right hilar, right suprahilar, subcarinal lymph node, liver, right iliac, bilateral inguinal nodes, sternum lytic lesion    09/19/2012 Procedure Liver biopsy- Sarcoidosis   09/26/2012 - 01/10/2013 Chemotherapy AC x 4 followed by weekly Paclitaxel x 12. Marker response supraclavicular and mediastinal nodes decreasing the breast mass and liver lesions resolution of sternal activity   04/10/2013 Surgery Bilateral mastectomies, left decidual IDC grade 2 x 0.7 cm with high-grade DCIS LV I 8/11 lymph nodes positive with extracapsular extension ER 99% PR 40%, HER-2 negative. R: No cancer   04/12/2013 Procedure Genetic testing did not reveal BRCA mutations   05/22/2013 - 06/23/2013 Radiation Therapy Adjuvant radiation therapy with Xeloda   06/23/2013 -  Anti-estrogen oral  therapy Tamoxifen 20 mg daily with Zoladex every 3 months   05/28/2014 Relapse/Recurrence Bony metastatic disease: Sternum biopsy metastatic carcinoma, HER-2 negative ratio 0.74   06/15/2014 -  Anti-estrogen oral therapy Ibrance, faslodex, xgeva    CHIEF COMPLIANT: follow up on Fosamax and Prevacid  INTERVAL HISTORY: KYMIAH Avery is a 43 year old American lady with above-mentioned history of metastatic breast cancer is currently on Ibrance cervix and Xgeva. After the first week of Ibrance, the medication had to be held for neutropenia with an [place] of 1.4. Even after holding for a week her ANC the week after was 0.8. So further treatments were held and she is here today to review her lab work and a followup. She denies any major problems tolerating it. She did have fatigue for a few days after the treatment. Denied any fevers or chills. She also had a urinary tract infection which has now resolved. She is due for Faslodex injection on November 30.  REVIEW OF SYSTEMS:   Constitutional: Denies fevers, chills or abnormal weight loss Eyes: Denies blurriness of vision Ears, nose, mouth, throat, and face: Denies mucositis or sore throat Respiratory: Denies cough, dyspnea or wheezes Cardiovascular: Denies palpitation, chest discomfort or lower extremity swelling Gastrointestinal:  Denies nausea, heartburn or change in bowel habits Skin: Denies abnormal skin rashes Lymphatics: Denies new lymphadenopathy or easy bruising Neurological:Denies numbness, tingling or new weaknesses Behavioral/Psych: Mood is stable, no new changes  All other systems were reviewed with the patient and are negative.  I have reviewed the past medical history, past  surgical history, social history and family history with the patient and they are unchanged from previous note.  ALLERGIES:  is allergic to metronidazole and sulfonamide derivatives.  MEDICATIONS:  Current Outpatient Prescriptions  Medication Sig Dispense Refill   . fluticasone (FLONASE) 50 MCG/ACT nasal spray Place 1-2 sprays into both nostrils once as needed for allergies or rhinitis.    Marland Kitchen lidocaine-prilocaine (EMLA) cream Apply 1 application topically as needed. 30 g 0  . ondansetron (ZOFRAN) 8 MG tablet Take 1 tablet (8 mg total) by mouth every 8 (eight) hours as needed for nausea. 30 tablet 3  . potassium chloride SA (K-DUR,KLOR-CON) 20 MEQ tablet Take 1 tablet (20 mEq total) by mouth 2 (two) times daily. 60 tablet 1  . palbociclib (IBRANCE) 100 MG capsule Take 1 capsule (100 mg total) by mouth daily with breakfast. Take whole with food for 21 days.  Then 7 days off to complete cycle. 21 capsule 1  . tamoxifen (NOLVADEX) 20 MG tablet Take 20 mg by mouth every morning.     No current facility-administered medications for this visit.    PHYSICAL EXAMINATION: ECOG PERFORMANCE STATUS: 1 - Symptomatic but completely ambulatory  Filed Vitals:   07/09/14 0828  BP: 150/86  Pulse: 105  Temp: 98.2 F (36.8 C)  Resp: 18   Filed Weights   07/09/14 0828  Weight: 193 lb 12.8 oz (87.907 kg)    GENERAL:alert, no distress and comfortable SKIN: skin color, texture, turgor are normal, no rashes or significant lesions EYES: normal, Conjunctiva are pink and non-injected, sclera clear OROPHARYNX:no exudate, no erythema and lips, buccal mucosa, and tongue normal  NECK: supple, thyroid normal size, non-tender, without nodularity LYMPH:  no palpable lymphadenopathy in the cervical, axillary or inguinal LUNGS: clear to auscultation and percussion with normal breathing effort HEART: regular rate & rhythm and no murmurs and no lower extremity edema ABDOMEN:abdomen soft, non-tender and normal bowel sounds Musculoskeletal:no cyanosis of digits and no clubbing  NEURO: alert & oriented x 3 with fluent speech, no focal motor/sensory deficits  LABORATORY DATA:  I have reviewed the data as listed   Chemistry      Component Value Date/Time   NA 140 07/02/2014  0841   NA 138 04/12/2013 0715   K 3.6 07/02/2014 0841   K 3.3* 04/12/2013 0715   CL 104 04/12/2013 0715   CO2 26 07/02/2014 0841   CO2 25 04/12/2013 0715   BUN 11.7 07/02/2014 0841   BUN 8 04/12/2013 0715   CREATININE 0.8 07/02/2014 0841   CREATININE 0.69 04/12/2013 0715      Component Value Date/Time   CALCIUM 9.1 07/02/2014 0841   CALCIUM 8.8 04/12/2013 0715   ALKPHOS 89 07/02/2014 0841   ALKPHOS 112 04/04/2013 0927   AST 21 07/02/2014 0841   AST 30 04/04/2013 0927   ALT 21 07/02/2014 0841   ALT 31 04/04/2013 0927   BILITOT 0.43 07/02/2014 0841   BILITOT 0.5 04/04/2013 0927       Lab Results  Component Value Date   WBC 8.5 07/09/2014   HGB 11.6 07/09/2014   HCT 36.2 07/09/2014   MCV 79.4* 07/09/2014   PLT 182 07/09/2014   NEUTROABS 6.2 07/09/2014   ASSESSMENT & PLAN:  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  Severe neutropenia: After one week of therapy, ANC dropped down to 0.8 and we had to with hold Palbociclib. 2. Fatigue related to Palbociclib 3. UTI 4. Injection site discomfort related to Faslodex  Plan: 1. Reduced the dosage of Palbociclib to 100 mg daily 2. Monitoring closely for toxicities. 3.  Return to clinic in one week for Faslodex injection and in 2 weeks to see me.   Orders Placed This Encounter  Procedures  . CBC with Differential    Standing Status: Future     Number of Occurrences:      Standing Expiration Date: 07/09/2015  . Comprehensive metabolic panel (Cmet) - [facility]    Standing Status: Future     Number of Occurrences:      Standing Expiration Date: 07/09/2015   The patient has a good understanding of the overall plan. she agrees with it. She will  call with any problems that may develop before her next visit here.   Rulon Eisenmenger, MD 07/09/2014 8:57 AM

## 2014-07-09 NOTE — Telephone Encounter (Signed)
, °

## 2014-07-09 NOTE — Assessment & Plan Note (Addendum)
Metastatic breast cancer with bone metastases, lung, liver metastases, spleen lesions, and pleural irregularity ER 100%, PR 3%, HER-2 negative Patient is starting therapy with Leslee Home, Faslodex, Xgeva started 06/15/14  Patient experienced the following toxicities of treatment: 1. Severe neutropenia: After one week of therapy, ANC dropped down to 0.8 and we had to with hold Palbociclib. 2. Fatigue related to Palbociclib 3. UTI 4. Injection site discomfort related to Faslodex  Plan: 1. Reduced the dosage of Palbociclib to 100 mg daily 2. Monitoring closely for toxicities. 3.  Return to clinic in one week for Faslodex injection and in 2 weeks to see me.

## 2014-07-16 ENCOUNTER — Ambulatory Visit (HOSPITAL_BASED_OUTPATIENT_CLINIC_OR_DEPARTMENT_OTHER): Payer: BC Managed Care – PPO

## 2014-07-16 ENCOUNTER — Other Ambulatory Visit: Payer: Self-pay | Admitting: *Deleted

## 2014-07-16 ENCOUNTER — Other Ambulatory Visit: Payer: BC Managed Care – PPO

## 2014-07-16 DIAGNOSIS — C787 Secondary malignant neoplasm of liver and intrahepatic bile duct: Secondary | ICD-10-CM

## 2014-07-16 DIAGNOSIS — Z5111 Encounter for antineoplastic chemotherapy: Secondary | ICD-10-CM

## 2014-07-16 DIAGNOSIS — C50919 Malignant neoplasm of unspecified site of unspecified female breast: Secondary | ICD-10-CM

## 2014-07-16 DIAGNOSIS — C50412 Malignant neoplasm of upper-outer quadrant of left female breast: Secondary | ICD-10-CM

## 2014-07-16 DIAGNOSIS — C7951 Secondary malignant neoplasm of bone: Secondary | ICD-10-CM

## 2014-07-16 MED ORDER — DENOSUMAB 120 MG/1.7ML ~~LOC~~ SOLN
120.0000 mg | Freq: Once | SUBCUTANEOUS | Status: AC
  Administered 2014-07-16: 120 mg via SUBCUTANEOUS
  Filled 2014-07-16: qty 1.7

## 2014-07-16 MED ORDER — FULVESTRANT 250 MG/5ML IM SOLN
500.0000 mg | Freq: Once | INTRAMUSCULAR | Status: AC
  Administered 2014-07-16: 500 mg via INTRAMUSCULAR
  Filled 2014-07-16: qty 10

## 2014-07-16 NOTE — Patient Instructions (Addendum)
Fulvestrant injection What is this medicine? FULVESTRANT (ful VES trant) blocks the effects of estrogen. It is used to treat breast cancer in women past the age of menopause. This medicine may be used for other purposes; ask your health care provider or pharmacist if you have questions. COMMON BRAND NAME(S): FASLODEX What should I tell my health care provider before I take this medicine? They need to know if you have any of these conditions: -bleeding problems -liver disease -low levels of platelets in the blood -an unusual or allergic reaction to fulvestrant, other medicines, foods, dyes, or preservatives -pregnant or trying to get pregnant -breast-feeding How should I use this medicine? This medicine is for injection into a muscle. It is usually given by a health care professional in a hospital or clinic setting. Talk to your pediatrician regarding the use of this medicine in children. Special care may be needed. Overdosage: If you think you have taken too much of this medicine contact a poison control center or emergency room at once. NOTE: This medicine is only for you. Do not share this medicine with others. What if I miss a dose? It is important not to miss your dose. Call your doctor or health care professional if you are unable to keep an appointment. What may interact with this medicine? -medicines that treat or prevent blood clots like warfarin, enoxaparin, and dalteparin This list may not describe all possible interactions. Give your health care provider a list of all the medicines, herbs, non-prescription drugs, or dietary supplements you use. Also tell them if you smoke, drink alcohol, or use illegal drugs. Some items may interact with your medicine. What should I watch for while using this medicine? Your condition will be monitored carefully while you are receiving this medicine. You will need important blood work done while you are taking this medicine. Do not become pregnant  while taking this medicine. Women should inform their doctor if they wish to become pregnant or think they might be pregnant. There is a potential for serious side effects to an unborn child. Talk to your health care professional or pharmacist for more information. What side effects may I notice from receiving this medicine? Side effects that you should report to your doctor or health care professional as soon as possible: -allergic reactions like skin rash, itching or hives, swelling of the face, lips, or tongue -feeling faint or lightheaded, falls -fever or flu-like symptoms -sore throat -vaginal bleeding Side effects that usually do not require medical attention (report to your doctor or health care professional if they continue or are bothersome): -aches, pains -constipation or diarrhea -headache -hot flashes -nausea, vomiting -pain at site where injected -stomach pain This list may not describe all possible side effects. Call your doctor for medical advice about side effects. You may report side effects to FDA at 1-800-FDA-1088. Where should I keep my medicine? This drug is given in a hospital or clinic and will not be stored at home. NOTE: This sheet is a summary. It may not cover all possible information. If you have questions about this medicine, talk to your doctor, pharmacist, or health care provider.  2015, Elsevier/Gold Standard. (2007-12-12 15:39:24)  Denosumab injection What is this medicine? DENOSUMAB (den oh sue mab) slows bone breakdown. Prolia is used to treat osteoporosis in women after menopause and in men. Xgeva is used to prevent bone fractures and other bone problems caused by cancer bone metastases. Xgeva is also used to treat giant cell tumor of the bone.   This medicine may be used for other purposes; ask your health care provider or pharmacist if you have questions. COMMON BRAND NAME(S): Prolia, XGEVA What should I tell my health care provider before I take this  medicine? They need to know if you have any of these conditions: -dental disease -eczema -infection or history of infections -kidney disease or on dialysis -low blood calcium or vitamin D -malabsorption syndrome -scheduled to have surgery or tooth extraction -taking medicine that contains denosumab -thyroid or parathyroid disease -an unusual reaction to denosumab, other medicines, foods, dyes, or preservatives -pregnant or trying to get pregnant -breast-feeding How should I use this medicine? This medicine is for injection under the skin. It is given by a health care professional in a hospital or clinic setting. If you are getting Prolia, a special MedGuide will be given to you by the pharmacist with each prescription and refill. Be sure to read this information carefully each time. For Prolia, talk to your pediatrician regarding the use of this medicine in children. Special care may be needed. For Xgeva, talk to your pediatrician regarding the use of this medicine in children. While this drug may be prescribed for children as young as 13 years for selected conditions, precautions do apply. Overdosage: If you think you've taken too much of this medicine contact a poison control center or emergency room at once. Overdosage: If you think you have taken too much of this medicine contact a poison control center or emergency room at once. NOTE: This medicine is only for you. Do not share this medicine with others. What if I miss a dose? It is important not to miss your dose. Call your doctor or health care professional if you are unable to keep an appointment. What may interact with this medicine? Do not take this medicine with any of the following medications: -other medicines containing denosumab This medicine may also interact with the following medications: -medicines that suppress the immune system -medicines that treat cancer -steroid medicines like prednisone or cortisone This list  may not describe all possible interactions. Give your health care provider a list of all the medicines, herbs, non-prescription drugs, or dietary supplements you use. Also tell them if you smoke, drink alcohol, or use illegal drugs. Some items may interact with your medicine. What should I watch for while using this medicine? Visit your doctor or health care professional for regular checks on your progress. Your doctor or health care professional may order blood tests and other tests to see how you are doing. Call your doctor or health care professional if you get a cold or other infection while receiving this medicine. Do not treat yourself. This medicine may decrease your body's ability to fight infection. You should make sure you get enough calcium and vitamin D while you are taking this medicine, unless your doctor tells you not to. Discuss the foods you eat and the vitamins you take with your health care professional. See your dentist regularly. Brush and floss your teeth as directed. Before you have any dental work done, tell your dentist you are receiving this medicine. Do not become pregnant while taking this medicine or for 5 months after stopping it. Women should inform their doctor if they wish to become pregnant or think they might be pregnant. There is a potential for serious side effects to an unborn child. Talk to your health care professional or pharmacist for more information. What side effects may I notice from receiving this medicine? Side effects that   you should report to your doctor or health care professional as soon as possible: -allergic reactions like skin rash, itching or hives, swelling of the face, lips, or tongue -breathing problems -chest pain -fast, irregular heartbeat -feeling faint or lightheaded, falls -fever, chills, or any other sign of infection -muscle spasms, tightening, or twitches -numbness or tingling -skin blisters or bumps, or is dry, peels, or red -slow  healing or unexplained pain in the mouth or jaw -unusual bleeding or bruising Side effects that usually do not require medical attention (Report these to your doctor or health care professional if they continue or are bothersome.): -muscle pain -stomach upset, gas This list may not describe all possible side effects. Call your doctor for medical advice about side effects. You may report side effects to FDA at 1-800-FDA-1088. Where should I keep my medicine? This medicine is only given in a clinic, doctor's office, or other health care setting and will not be stored at home. NOTE: This sheet is a summary. It may not cover all possible information. If you have questions about this medicine, talk to your doctor, pharmacist, or health care provider.  2015, Elsevier/Gold Standard. (2012-02-01 12:37:47)  

## 2014-07-23 ENCOUNTER — Telehealth: Payer: Self-pay | Admitting: Hematology and Oncology

## 2014-07-23 ENCOUNTER — Ambulatory Visit (HOSPITAL_BASED_OUTPATIENT_CLINIC_OR_DEPARTMENT_OTHER): Payer: BC Managed Care – PPO | Admitting: Hematology and Oncology

## 2014-07-23 ENCOUNTER — Other Ambulatory Visit (HOSPITAL_BASED_OUTPATIENT_CLINIC_OR_DEPARTMENT_OTHER): Payer: BC Managed Care – PPO

## 2014-07-23 VITALS — BP 135/82 | HR 94 | Temp 98.4°F | Resp 19 | Ht 64.0 in | Wt 194.8 lb

## 2014-07-23 DIAGNOSIS — C7889 Secondary malignant neoplasm of other digestive organs: Secondary | ICD-10-CM

## 2014-07-23 DIAGNOSIS — C50411 Malignant neoplasm of upper-outer quadrant of right female breast: Secondary | ICD-10-CM

## 2014-07-23 DIAGNOSIS — C7951 Secondary malignant neoplasm of bone: Secondary | ICD-10-CM

## 2014-07-23 DIAGNOSIS — C7800 Secondary malignant neoplasm of unspecified lung: Secondary | ICD-10-CM

## 2014-07-23 DIAGNOSIS — C50919 Malignant neoplasm of unspecified site of unspecified female breast: Secondary | ICD-10-CM

## 2014-07-23 DIAGNOSIS — C787 Secondary malignant neoplasm of liver and intrahepatic bile duct: Secondary | ICD-10-CM

## 2014-07-23 DIAGNOSIS — C50912 Malignant neoplasm of unspecified site of left female breast: Secondary | ICD-10-CM

## 2014-07-23 LAB — CBC WITH DIFFERENTIAL/PLATELET
BASO%: 2 % (ref 0.0–2.0)
Basophils Absolute: 0 10*3/uL (ref 0.0–0.1)
EOS ABS: 0.1 10*3/uL (ref 0.0–0.5)
EOS%: 6.2 % (ref 0.0–7.0)
HCT: 35.7 % (ref 34.8–46.6)
HGB: 11.4 g/dL — ABNORMAL LOW (ref 11.6–15.9)
LYMPH%: 28.1 % (ref 14.0–49.7)
MCH: 25.6 pg (ref 25.1–34.0)
MCHC: 32.1 g/dL (ref 31.5–36.0)
MCV: 79.9 fL (ref 79.5–101.0)
MONO#: 0.2 10*3/uL (ref 0.1–0.9)
MONO%: 11.1 % (ref 0.0–14.0)
NEUT%: 52.6 % (ref 38.4–76.8)
NEUTROS ABS: 1 10*3/uL — AB (ref 1.5–6.5)
PLATELETS: 274 10*3/uL (ref 145–400)
RBC: 4.47 10*6/uL (ref 3.70–5.45)
RDW: 17.3 % — ABNORMAL HIGH (ref 11.2–14.5)
WBC: 2 10*3/uL — AB (ref 3.9–10.3)
lymph#: 0.6 10*3/uL — ABNORMAL LOW (ref 0.9–3.3)

## 2014-07-23 LAB — COMPREHENSIVE METABOLIC PANEL (CC13)
ALK PHOS: 101 U/L (ref 40–150)
ALT: 27 U/L (ref 0–55)
ANION GAP: 10 meq/L (ref 3–11)
AST: 22 U/L (ref 5–34)
Albumin: 3.5 g/dL (ref 3.5–5.0)
BUN: 9.9 mg/dL (ref 7.0–26.0)
CO2: 24 meq/L (ref 22–29)
CREATININE: 0.8 mg/dL (ref 0.6–1.1)
Calcium: 8.4 mg/dL (ref 8.4–10.4)
Chloride: 106 mEq/L (ref 98–109)
EGFR: >90 mL/min/{1.73_m2} (ref >90)
GLUCOSE: 124 mg/dL (ref 70–140)
Potassium: 3.4 mEq/L — ABNORMAL LOW (ref 3.5–5.1)
SODIUM: 141 meq/L (ref 136–145)
Total Bilirubin: 0.52 mg/dL (ref 0.20–1.20)
Total Protein: 7.2 g/dL (ref 6.4–8.3)

## 2014-07-23 NOTE — Assessment & Plan Note (Addendum)
Metastatic breast cancer with bone metastases, lung, liver metastases, spleen lesions, and pleural irregularity ER 100%, PR 3%, HER-2 negative. Patient is currently on Ibrance, Faslodex, Delton See that started 06/15/14.  Patient experienced the following toxicities of treatment: 1. Severe neutropenia: After one week of therapy, ANC dropped down to 0.8 and we had to with hold Palbociclib and reduced the dosage to 100 mg daily. In spite of that dose reduction to face absolute neutrophil count is at 1000. I discussed with her that we should continue with that dosage and see her back in a week to reassess our treatment plan. If ANC drops below 750, we might have to hold treatment and decrease the dosage for the next cycle. 2. Fatigue related to Palbociclib: Slightly improved with a lower dosage of Ibrance 3. UTI: resolved 4. Injection site discomfort related to Faslodex Continuing to monitor her closely for toxicities.  Plan: 1. Continue reduced dosage of Palbociclib at 100 mg daily 2. Return to clinic in one week for CBC check

## 2014-07-23 NOTE — Progress Notes (Signed)
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 She also has suspicious bone lesions but not easily accessible acc.to IR.  Therefore we are giving her the benefit of the doubt and treating her with curative intent. MRI suggests nodal involvement in the axilla.        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 Liver metastases   09/14/2012 Cancer Staging PET scan- multiple hypermetabolic areas left pectoral node the breast mass, left hilar, suprahilar, right hilar, right suprahilar, subcarinal lymph node, liver, right iliac, bilateral inguinal nodes, sternum lytic lesion    09/19/2012 Procedure Liver biopsy- Sarcoidosis   09/26/2012 - 01/10/2013 Chemotherapy AC x 4 followed by weekly Paclitaxel x 12. Marker response supraclavicular and mediastinal nodes decreasing the breast mass and liver lesions resolution of sternal activity   04/10/2013 Surgery Bilateral mastectomies, left decidual IDC grade 2 x 0.7 cm with high-grade DCIS LV I 8/11 lymph nodes positive with extracapsular extension ER 99% PR 40%, HER-2 negative. R: No cancer   04/12/2013 Procedure Genetic testing did not reveal BRCA mutations   05/22/2013 - 06/23/2013 Radiation Therapy Adjuvant radiation therapy with Xeloda   06/23/2013 -   Anti-estrogen oral therapy Tamoxifen 20 mg daily with Zoladex every 3 months   05/28/2014 Relapse/Recurrence Bony metastatic disease: Sternum biopsy metastatic carcinoma, HER-2 negative ratio 0.74   06/15/2014 -  Anti-estrogen oral therapy Ibrance, faslodex, xgeva    CHIEF COMPLIANT: Cycle 2 day 5 Ibrance  INTERVAL HISTORY: Brenda Avery is a 43 tablet with above-mentioned history of metastatic breast cancer with bone metastases currently on Faslodex with Ibrance. She started back at the lower doses of 100 mg daily and is taken for 4 days and is here today for a blood count check. She reports and energy levels have improved slightly. She has excellent appetite and no taste changes. Denies any fevers or chills. Denies any nausea vomiting.  REVIEW OF SYSTEMS:   Constitutional: Denies fevers, chills or abnormal weight loss Eyes: Denies blurriness of vision Ears, nose, mouth, throat, and face: Denies mucositis or sore throat Respiratory: Denies cough, dyspnea or wheezes Cardiovascular: Denies palpitation, chest discomfort or lower extremity swelling Gastrointestinal:  Denies nausea, heartburn or change in bowel habits Skin: Denies abnormal skin rashes Lymphatics: Denies new lymphadenopathy or easy bruising Neurological:Denies numbness, tingling or new weaknesses Behavioral/Psych: Mood is stable, no new changes  All other systems were reviewed with the patient and are negative.  I have reviewed the past medical history, past surgical history, social history and family history with the patient and they are unchanged from previous note.  ALLERGIES:  is allergic to metronidazole and sulfonamide derivatives.  MEDICATIONS:  Current Outpatient Prescriptions  Medication Sig Dispense Refill  .  fluticasone (FLONASE) 50 MCG/ACT nasal spray Place 1-2 sprays into both nostrils once as needed for allergies or rhinitis.    Leslee Home 125 MG capsule   2  . lidocaine-prilocaine (EMLA) cream Apply 1  application topically as needed. 30 g 0  . ondansetron (ZOFRAN) 8 MG tablet Take 1 tablet (8 mg total) by mouth every 8 (eight) hours as needed for nausea. 30 tablet 3  . potassium chloride SA (K-DUR,KLOR-CON) 20 MEQ tablet Take 1 tablet (20 mEq total) by mouth 2 (two) times daily. 60 tablet 1  . tamoxifen (NOLVADEX) 20 MG tablet Take 20 mg by mouth every morning.     No current facility-administered medications for this visit.    PHYSICAL EXAMINATION: ECOG PERFORMANCE STATUS: 1 - Symptomatic but completely ambulatory  Filed Vitals:   07/23/14 0955  BP: 135/82  Pulse: 94  Temp: 98.4 F (36.9 C)  Resp: 19   Filed Weights   07/23/14 0955  Weight: 194 lb 12.8 oz (88.361 kg)    GENERAL:alert, no distress and comfortable SKIN: skin color, texture, turgor are normal, no rashes or significant lesions EYES: normal, Conjunctiva are pink and non-injected, sclera clear OROPHARYNX:no exudate, no erythema and lips, buccal mucosa, and tongue normal  NECK: supple, thyroid normal size, non-tender, without nodularity LYMPH:  no palpable lymphadenopathy in the cervical, axillary or inguinal LUNGS: clear to auscultation and percussion with normal breathing effort HEART: regular rate & rhythm and no murmurs and no lower extremity edema ABDOMEN:abdomen soft, non-tender and normal bowel sounds Musculoskeletal:no cyanosis of digits and no clubbing  NEURO: alert & oriented x 3 with fluent speech, no focal motor/sensory deficits  LABORATORY DATA:  I have reviewed the data as listed   Chemistry      Component Value Date/Time   NA 141 07/09/2014 0815   NA 138 04/12/2013 0715   K 3.7 07/09/2014 0815   K 3.3* 04/12/2013 0715   CL 104 04/12/2013 0715   CO2 24 07/09/2014 0815   CO2 25 04/12/2013 0715   BUN 7.5 07/09/2014 0815   BUN 8 04/12/2013 0715   CREATININE 0.8 07/09/2014 0815   CREATININE 0.69 04/12/2013 0715      Component Value Date/Time   CALCIUM 9.0 07/09/2014 0815   CALCIUM 8.8  04/12/2013 0715   ALKPHOS 123 07/09/2014 0815   ALKPHOS 112 04/04/2013 0927   AST 24 07/09/2014 0815   AST 30 04/04/2013 0927   ALT 20 07/09/2014 0815   ALT 31 04/04/2013 0927   BILITOT 0.32 07/09/2014 0815   BILITOT 0.5 04/04/2013 0927       Lab Results  Component Value Date   WBC 2.0* 07/23/2014   HGB 11.4* 07/23/2014   HCT 35.7 07/23/2014   MCV 79.9 07/23/2014   PLT 274 07/23/2014   NEUTROABS 1.0* 07/23/2014   ASSESSMENT & PLAN:  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 Patient is currently on Ibrance, Faslodex, Delton See that started 06/15/14.  Patient experienced the following toxicities of treatment: 1. Severe neutropenia: After one week of therapy, ANC dropped down to 0.8 and we had to with hold Palbociclib and reduced the dosage to 100 mg daily. In spite of that dose reduction to face absolute neutrophil count is at 1000. I discussed with her that we should continue with that dosage and see her back in a week to reassess our treatment plan. If  ANC drops below 750, we might have to hold treatment and decrease the dosage for the next cycle. 2. Fatigue related to Palbociclib: Slightly improved with a lower dosage of Ibrance 3. UTI: resolved 4. Injection site discomfort related to Faslodex Continuing to monitor her closely for toxicities.  Plan: 1. Continue reduced dosage of Palbociclib at 100 mg daily 2. Return to clinic in one week for CBC check   Orders Placed This Encounter  Procedures  . CBC with Differential    Standing Status: Standing     Number of Occurrences: 3     Standing Expiration Date: 07/24/2015   The patient has a good understanding of the overall plan. she agrees with it. She will call with any problems that may develop before her next visit here.   Rulon Eisenmenger, MD 07/23/2014 10:14 AM

## 2014-07-23 NOTE — Telephone Encounter (Signed)
per pof to sch pt appt-gave pt copy of sch °

## 2014-07-30 ENCOUNTER — Other Ambulatory Visit: Payer: BC Managed Care – PPO

## 2014-07-30 ENCOUNTER — Ambulatory Visit (HOSPITAL_BASED_OUTPATIENT_CLINIC_OR_DEPARTMENT_OTHER): Payer: BC Managed Care – PPO | Admitting: Hematology and Oncology

## 2014-07-30 ENCOUNTER — Telehealth: Payer: Self-pay | Admitting: Hematology and Oncology

## 2014-07-30 ENCOUNTER — Other Ambulatory Visit (HOSPITAL_BASED_OUTPATIENT_CLINIC_OR_DEPARTMENT_OTHER): Payer: BC Managed Care – PPO

## 2014-07-30 DIAGNOSIS — C50912 Malignant neoplasm of unspecified site of left female breast: Secondary | ICD-10-CM

## 2014-07-30 DIAGNOSIS — C787 Secondary malignant neoplasm of liver and intrahepatic bile duct: Secondary | ICD-10-CM

## 2014-07-30 DIAGNOSIS — Z170 Estrogen receptor positive status [ER+]: Secondary | ICD-10-CM

## 2014-07-30 DIAGNOSIS — D709 Neutropenia, unspecified: Secondary | ICD-10-CM

## 2014-07-30 DIAGNOSIS — C7951 Secondary malignant neoplasm of bone: Secondary | ICD-10-CM

## 2014-07-30 DIAGNOSIS — C7800 Secondary malignant neoplasm of unspecified lung: Secondary | ICD-10-CM

## 2014-07-30 DIAGNOSIS — C50919 Malignant neoplasm of unspecified site of unspecified female breast: Secondary | ICD-10-CM

## 2014-07-30 LAB — CBC WITH DIFFERENTIAL/PLATELET
BASO%: 1.3 % (ref 0.0–2.0)
Basophils Absolute: 0 10*3/uL (ref 0.0–0.1)
EOS ABS: 0.2 10*3/uL (ref 0.0–0.5)
EOS%: 8.5 % — ABNORMAL HIGH (ref 0.0–7.0)
HCT: 34.6 % — ABNORMAL LOW (ref 34.8–46.6)
HGB: 11.1 g/dL — ABNORMAL LOW (ref 11.6–15.9)
LYMPH%: 31.4 % (ref 14.0–49.7)
MCH: 25.9 pg (ref 25.1–34.0)
MCHC: 32 g/dL (ref 31.5–36.0)
MCV: 80.7 fL (ref 79.5–101.0)
MONO#: 0.2 10*3/uL (ref 0.1–0.9)
MONO%: 10.1 % (ref 0.0–14.0)
NEUT#: 0.9 10*3/uL — ABNORMAL LOW (ref 1.5–6.5)
NEUT%: 48.7 % (ref 38.4–76.8)
Platelets: 237 10*3/uL (ref 145–400)
RBC: 4.28 10*6/uL (ref 3.70–5.45)
RDW: 18.1 % — AB (ref 11.2–14.5)
WBC: 1.9 10*3/uL — AB (ref 3.9–10.3)
lymph#: 0.6 10*3/uL — ABNORMAL LOW (ref 0.9–3.3)

## 2014-07-30 LAB — COMPREHENSIVE METABOLIC PANEL (CC13)
ALBUMIN: 3.4 g/dL — AB (ref 3.5–5.0)
ALT: 24 U/L (ref 0–55)
AST: 22 U/L (ref 5–34)
Alkaline Phosphatase: 98 U/L (ref 40–150)
Anion Gap: 10 mEq/L (ref 3–11)
BUN: 8.4 mg/dL (ref 7.0–26.0)
CO2: 24 mEq/L (ref 22–29)
Calcium: 8.9 mg/dL (ref 8.4–10.4)
Chloride: 108 mEq/L (ref 98–109)
Creatinine: 0.8 mg/dL (ref 0.6–1.1)
GLUCOSE: 92 mg/dL (ref 70–140)
POTASSIUM: 3.5 meq/L (ref 3.5–5.1)
SODIUM: 141 meq/L (ref 136–145)
Total Bilirubin: 0.39 mg/dL (ref 0.20–1.20)
Total Protein: 7.2 g/dL (ref 6.4–8.3)

## 2014-07-30 NOTE — Assessment & Plan Note (Signed)
Metastatic breast cancer with bone metastases, lung, liver metastases, spleen lesions, and pleural irregularity ER 100%, PR 3%, HER-2 negative. Patient is currently on Ibrance, Faslodex, Delton See that started 06/15/14.  Patient experienced the following toxicities of treatment: 1. Severe neutropenia: After one week of therapy, ANC dropped down to 0.8 and we had to with hold Palbociclib and reduced the dosage to 100 mg daily. We are holding her at this dose level in spite of ANC of 900 today 2. Fatigue related to Palbociclib: Slightly improved with a lower dosage of Ibrance 3. UTI: resolved 4. Injection site discomfort related to Faslodex 5. Left hip and low back pain: I encouraged her to do stretching exercises. Continuing to monitor her closely for toxicities.  Plan: 1. Continue reduced dosage of Palbociclib at 100 mg daily 2. We will postpone the PET CT scan to be done after cycle 3 of treatment. 3. Return to clinic December 28 for Faslodex injection as well as to repeat blood work and to start cycle 3 of treatment with Leslee Home, Faslodex, Delton See

## 2014-07-30 NOTE — Progress Notes (Signed)
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 She also has suspicious bone lesions but not easily accessible acc.to IR.  Therefore we are giving her the benefit of the doubt and treating her with curative intent. MRI suggests nodal involvement in the axilla.        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 Liver metastases   09/14/2012 Cancer Staging PET scan- multiple hypermetabolic areas left pectoral node the breast mass, left hilar, suprahilar, right hilar, right suprahilar, subcarinal lymph node, liver, right iliac, bilateral inguinal nodes, sternum lytic lesion    09/19/2012 Procedure Liver biopsy- Sarcoidosis   09/26/2012 - 01/10/2013 Chemotherapy AC x 4 followed by weekly Paclitaxel x 12. Marker response supraclavicular and mediastinal nodes decreasing the breast mass and liver lesions resolution of sternal activity   04/10/2013 Surgery Bilateral mastectomies, left decidual IDC grade 2 x 0.7 cm with high-grade DCIS LV I 8/11 lymph nodes positive with extracapsular extension ER 99% PR 40%, HER-2 negative. R: No cancer   04/12/2013 Procedure Genetic testing did not reveal BRCA mutations   05/22/2013 - 06/23/2013 Radiation Therapy Adjuvant radiation therapy with Xeloda   06/23/2013 -   Anti-estrogen oral therapy Tamoxifen 20 mg daily with Zoladex every 3 months   05/28/2014 Relapse/Recurrence Bony metastatic disease: Sternum biopsy metastatic carcinoma, HER-2 negative ratio 0.74   06/15/2014 -  Anti-estrogen oral therapy Ibrance, faslodex, xgeva    CHIEF COMPLIANT: Follow-up on Ibrance  INTERVAL HISTORY: Brenda Avery is a 43 year old medically with above-mentioned history of metastatic breast cancer with bone metastases currently on Ibrance with Faslodex next year. We had to reduce the dose if Ibrance because her absolute neutrophil count had declined. She does not have any fevers or chills. I saw her a week ago and we decided to continue the dosage and have her come back today for a blood count check. Her ANC had declined from 1000 to 900. She complains of some low back and left hip pain. This was after a day of heavy walking. Says since resolved. Patient is planning to go on a vegetable and fruit diet for the whole month of January as part of her church.  REVIEW OF SYSTEMS:   Constitutional: Denies fevers, chills or abnormal weight loss Eyes: Denies blurriness of vision Ears, nose, mouth, throat, and face: Denies mucositis or sore throat Respiratory: Denies cough, dyspnea or wheezes Cardiovascular: Denies palpitation, chest discomfort or lower extremity swelling Gastrointestinal:  Denies nausea, heartburn or change in bowel habits Skin: Denies abnormal skin rashes Lymphatics: Denies new lymphadenopathy or easy bruising Neurological:Denies numbness, tingling or new weaknesses Behavioral/Psych: Mood is stable, no new changes  Breast:  denies any pain or lumps or nodules in either breasts  All other systems were reviewed with the patient and are negative.  I have reviewed the past medical history, past surgical history, social history and family history with the patient and they are unchanged from previous note.  ALLERGIES:  is allergic to metronidazole and sulfonamide  derivatives.  MEDICATIONS:  Current Outpatient Prescriptions  Medication Sig Dispense Refill  . fluticasone (FLONASE) 50 MCG/ACT nasal spray Place 1-2 sprays into both nostrils once as needed for allergies or rhinitis.    Marland Kitchen lidocaine-prilocaine (EMLA) cream Apply 1 application topically as needed. 30 g 0  . ondansetron (ZOFRAN) 8 MG tablet Take 1 tablet (8 mg total) by mouth every 8 (eight) hours as needed for nausea. 30 tablet 3  . palbociclib (IBRANCE) 100 MG capsule Take 100 mg by mouth daily with breakfast. Take whole with food.    . potassium chloride SA (K-DUR,KLOR-CON) 20 MEQ tablet Take 1 tablet (20 mEq total) by mouth 2 (two) times daily. 60 tablet 1   No current facility-administered medications for this visit.    PHYSICAL EXAMINATION: ECOG PERFORMANCE STATUS: 1 - Symptomatic but completely ambulatory  Filed Vitals:   07/30/14 1532  BP: 129/74  Pulse: 87  Temp: 98.9 F (37.2 C)  Resp: 19   Filed Weights   07/30/14 1532  Weight: 196 lb 6.4 oz (89.086 kg)    GENERAL:alert, no distress and comfortable SKIN: skin color, texture, turgor are normal, no rashes or significant lesions EYES: normal, Conjunctiva are pink and non-injected, sclera clear OROPHARYNX:no exudate, no erythema and lips, buccal mucosa, and tongue normal  NECK: supple, thyroid normal size, non-tender, without nodularity LYMPH:  no palpable lymphadenopathy in the cervical, axillary or inguinal LUNGS: clear to auscultation and percussion with normal breathing effort HEART: regular rate & rhythm and no murmurs and no lower extremity edema ABDOMEN:abdomen soft, non-tender and normal bowel sounds Musculoskeletal:no cyanosis of digits and no clubbing  NEURO: alert & oriented x 3 with fluent speech, no focal motor/sensory deficits  LABORATORY DATA:  I have reviewed the data as listed   Chemistry      Component Value Date/Time   NA 141 07/23/2014 0932   NA 138 04/12/2013 0715   K 3.4* 07/23/2014 0932    K 3.3* 04/12/2013 0715   CL 104 04/12/2013 0715   CO2 24 07/23/2014 0932   CO2 25 04/12/2013 0715   BUN 9.9 07/23/2014 0932   BUN 8 04/12/2013 0715   CREATININE 0.8 07/23/2014 0932   CREATININE 0.69 04/12/2013 0715      Component Value Date/Time   CALCIUM 8.4 07/23/2014 0932   CALCIUM 8.8 04/12/2013 0715   ALKPHOS 101 07/23/2014 0932   ALKPHOS 112 04/04/2013 0927   AST 22 07/23/2014 0932   AST 30 04/04/2013 0927   ALT 27 07/23/2014 0932   ALT 31 04/04/2013 0927   BILITOT 0.52 07/23/2014 0932   BILITOT 0.5 04/04/2013 0927       Lab Results  Component Value Date   WBC 1.9* 07/30/2014   HGB 11.1* 07/30/2014   HCT 34.6* 07/30/2014   MCV 80.7 07/30/2014   PLT 237 07/30/2014   NEUTROABS 0.9* 07/30/2014    ASSESSMENT & PLAN:  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 Patient is currently on Ibrance, Faslodex, Delton See that started 06/15/14.  Patient experienced the following toxicities of treatment: 1. Severe neutropenia: After one week of therapy, ANC  dropped down to 0.8 and we had to with hold Palbociclib and reduced the dosage to 100 mg daily. We are holding her at this dose level in spite of ANC of 900 today 2. Fatigue related to Palbociclib: Slightly improved with a lower dosage of Ibrance 3. UTI: resolved 4. Injection site discomfort related to Faslodex 5. Left hip and low back pain: I encouraged her to do stretching exercises. Continuing to monitor her closely for toxicities.  Plan: 1. Continue reduced dosage of Palbociclib at 100 mg daily 2. We will postpone the PET CT scan to be done after cycle 3 of treatment. 3. Return to clinic December 28 for Faslodex injection as well as to repeat blood work and to start cycle 3 of treatment with Leslee Home, Faslodex, Delton See   Orders Placed This Encounter  Procedures  . CBC  with Differential    Standing Status: Future     Number of Occurrences:      Standing Expiration Date: 07/30/2015  . Comprehensive metabolic panel (Cmet) - [facility]    Standing Status: Future     Number of Occurrences:      Standing Expiration Date: 07/30/2015   The patient has a good understanding of the overall plan. she agrees with it. She will call with any problems that may develop before her next visit here.   Rulon Eisenmenger, MD 07/30/2014 4:02 PM

## 2014-07-30 NOTE — Telephone Encounter (Signed)
, °

## 2014-08-03 ENCOUNTER — Telehealth: Payer: Self-pay

## 2014-08-03 NOTE — Telephone Encounter (Signed)
Confirmed with Dr Lindi Adie that pt needs labs every two weeks.  Lab appt 12/21 cancelled.  [place] notifying patient.

## 2014-08-06 ENCOUNTER — Other Ambulatory Visit: Payer: BC Managed Care – PPO

## 2014-08-06 ENCOUNTER — Ambulatory Visit: Payer: BC Managed Care – PPO | Admitting: Hematology and Oncology

## 2014-08-13 ENCOUNTER — Ambulatory Visit: Payer: BC Managed Care – PPO

## 2014-08-13 ENCOUNTER — Ambulatory Visit (HOSPITAL_BASED_OUTPATIENT_CLINIC_OR_DEPARTMENT_OTHER): Payer: BC Managed Care – PPO | Admitting: Hematology and Oncology

## 2014-08-13 ENCOUNTER — Ambulatory Visit (HOSPITAL_COMMUNITY): Payer: BC Managed Care – PPO

## 2014-08-13 ENCOUNTER — Other Ambulatory Visit (HOSPITAL_BASED_OUTPATIENT_CLINIC_OR_DEPARTMENT_OTHER): Payer: BC Managed Care – PPO

## 2014-08-13 ENCOUNTER — Other Ambulatory Visit: Payer: BC Managed Care – PPO

## 2014-08-13 ENCOUNTER — Telehealth: Payer: Self-pay | Admitting: Hematology and Oncology

## 2014-08-13 VITALS — BP 131/86 | HR 91 | Temp 98.6°F | Resp 20 | Ht 64.0 in | Wt 196.3 lb

## 2014-08-13 DIAGNOSIS — M545 Low back pain: Secondary | ICD-10-CM

## 2014-08-13 DIAGNOSIS — C7800 Secondary malignant neoplasm of unspecified lung: Secondary | ICD-10-CM

## 2014-08-13 DIAGNOSIS — C7889 Secondary malignant neoplasm of other digestive organs: Secondary | ICD-10-CM

## 2014-08-13 DIAGNOSIS — C7951 Secondary malignant neoplasm of bone: Secondary | ICD-10-CM

## 2014-08-13 DIAGNOSIS — D709 Neutropenia, unspecified: Secondary | ICD-10-CM

## 2014-08-13 DIAGNOSIS — C787 Secondary malignant neoplasm of liver and intrahepatic bile duct: Secondary | ICD-10-CM

## 2014-08-13 DIAGNOSIS — C50912 Malignant neoplasm of unspecified site of left female breast: Secondary | ICD-10-CM

## 2014-08-13 DIAGNOSIS — M25552 Pain in left hip: Secondary | ICD-10-CM

## 2014-08-13 DIAGNOSIS — Z95828 Presence of other vascular implants and grafts: Secondary | ICD-10-CM

## 2014-08-13 LAB — CBC WITH DIFFERENTIAL/PLATELET
BASO%: 2.5 % — AB (ref 0.0–2.0)
Basophils Absolute: 0 10*3/uL (ref 0.0–0.1)
EOS%: 5 % (ref 0.0–7.0)
Eosinophils Absolute: 0 10*3/uL (ref 0.0–0.5)
HEMATOCRIT: 33.1 % — AB (ref 34.8–46.6)
HEMOGLOBIN: 10.6 g/dL — AB (ref 11.6–15.9)
LYMPH%: 37.3 % (ref 14.0–49.7)
MCH: 26.3 pg (ref 25.1–34.0)
MCHC: 32 g/dL (ref 31.5–36.0)
MCV: 82.2 fL (ref 79.5–101.0)
MONO#: 0.1 10*3/uL (ref 0.1–0.9)
MONO%: 11.2 % (ref 0.0–14.0)
NEUT#: 0.4 10*3/uL — CL (ref 1.5–6.5)
NEUT%: 44 % (ref 38.4–76.8)
PLATELETS: 166 10*3/uL (ref 145–400)
RBC: 4.03 10*6/uL (ref 3.70–5.45)
RDW: 18.7 % — AB (ref 11.2–14.5)
WBC: 1 10*3/uL — ABNORMAL LOW (ref 3.9–10.3)
lymph#: 0.4 10*3/uL — ABNORMAL LOW (ref 0.9–3.3)

## 2014-08-13 LAB — COMPREHENSIVE METABOLIC PANEL (CC13)
ALBUMIN: 3.4 g/dL — AB (ref 3.5–5.0)
ALT: 24 U/L (ref 0–55)
ANION GAP: 9 meq/L (ref 3–11)
AST: 19 U/L (ref 5–34)
Alkaline Phosphatase: 82 U/L (ref 40–150)
BILIRUBIN TOTAL: 0.43 mg/dL (ref 0.20–1.20)
BUN: 9.9 mg/dL (ref 7.0–26.0)
CO2: 26 meq/L (ref 22–29)
Calcium: 9 mg/dL (ref 8.4–10.4)
Chloride: 106 mEq/L (ref 98–109)
Creatinine: 0.8 mg/dL (ref 0.6–1.1)
GLUCOSE: 135 mg/dL (ref 70–140)
Potassium: 3.3 mEq/L — ABNORMAL LOW (ref 3.5–5.1)
Sodium: 141 mEq/L (ref 136–145)
Total Protein: 7.1 g/dL (ref 6.4–8.3)

## 2014-08-13 MED ORDER — HEPARIN SOD (PORK) LOCK FLUSH 100 UNIT/ML IV SOLN
500.0000 [IU] | Freq: Once | INTRAVENOUS | Status: AC
Start: 2014-08-13 — End: 2014-08-13
  Administered 2014-08-13: 500 [IU] via INTRAVENOUS
  Filled 2014-08-13: qty 5

## 2014-08-13 MED ORDER — PALBOCICLIB 75 MG PO CAPS: 75.0000 mg | ORAL_CAPSULE | Freq: Every day | ORAL | Status: DC

## 2014-08-13 MED ORDER — SODIUM CHLORIDE 0.9 % IJ SOLN
10.0000 mL | INTRAMUSCULAR | Status: DC | PRN
  Administered 2014-08-13: 10 mL via INTRAVENOUS
  Filled 2014-08-13: qty 10

## 2014-08-13 NOTE — Progress Notes (Signed)
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 She also has suspicious bone lesions but not easily accessible acc.to IR.  Therefore we are giving her the benefit of the doubt and treating her with curative intent. MRI suggests nodal involvement in the axilla.        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 Liver metastases   09/14/2012 Cancer Staging PET scan- multiple hypermetabolic areas left pectoral node the breast mass, left hilar, suprahilar, right hilar, right suprahilar, subcarinal lymph node, liver, right iliac, bilateral inguinal nodes, sternum lytic lesion    09/19/2012 Procedure Liver biopsy- Sarcoidosis   09/26/2012 - 01/10/2013 Chemotherapy AC x 4 followed by weekly Paclitaxel x 12. Marker response supraclavicular and mediastinal nodes decreasing the breast mass and liver lesions resolution of sternal activity   04/10/2013 Surgery Bilateral mastectomies, left decidual IDC grade 2 x 0.7 cm with high-grade DCIS LV I 8/11 lymph nodes positive with extracapsular extension ER 99% PR 40%, HER-2 negative. R: No cancer   04/12/2013 Procedure Genetic testing did not reveal BRCA mutations   05/22/2013 - 06/23/2013 Radiation Therapy Adjuvant radiation therapy with Xeloda   06/23/2013 -  Anti-estrogen oral therapy Tamoxifen  20 mg daily with Zoladex every 3 months   05/28/2014 Relapse/Recurrence Bony metastatic disease: Sternum biopsy metastatic carcinoma, HER-2 negative ratio 0.74   06/15/2014 -  Anti-estrogen oral therapy Ibrance, faslodex, xgeva    CHIEF COMPLIANT: Follow-up on Palbociclib  INTERVAL HISTORY: Brenda Avery is a 43 yr old lady with above-mentioned history of metastatic breast cancer currently on Palbociclib with Faslodex and Xgeva. She is doing great without any new side effects or concerns. Unfortunately today her absolute neutrophil count is 400. She denies any fevers or chills or any other side effects. Energy levels are much improved.  REVIEW OF SYSTEMS:   Constitutional: Denies fevers, chills or abnormal weight loss Eyes: Denies blurriness of vision Ears, nose, mouth, throat, and face: Denies mucositis or sore throat Respiratory: Denies cough, dyspnea or wheezes Cardiovascular: Denies palpitation, chest discomfort or lower extremity swelling Gastrointestinal:  Denies nausea, heartburn or change in bowel habits Skin: Denies abnormal skin rashes Lymphatics: Denies new lymphadenopathy or easy bruising Neurological:Denies numbness, tingling or new weaknesses Behavioral/Psych: Mood is stable, no new changes  Breast:  denies any pain or lumps or nodules in either breasts All other systems were reviewed with the patient and are negative.  I have reviewed the past medical history, past surgical history, social history and family history with the patient and they are unchanged from previous note.  ALLERGIES:  is allergic to metronidazole and sulfonamide derivatives.  MEDICATIONS:  Current Outpatient Prescriptions  Medication Sig Dispense Refill  . fluticasone (FLONASE) 50 MCG/ACT nasal spray Place 1-2 sprays  into both nostrils once as needed for allergies or rhinitis.    Marland Kitchen lidocaine-prilocaine (EMLA) cream Apply 1 application topically as needed. 30 g 0  . ondansetron (ZOFRAN) 8 MG tablet  Take 1 tablet (8 mg total) by mouth every 8 (eight) hours as needed for nausea. 30 tablet 3  . palbociclib (IBRANCE) 75 MG capsule Take 1 capsule (75 mg total) by mouth daily with breakfast. Take whole with food. 21 capsule 1  . potassium chloride SA (K-DUR,KLOR-CON) 20 MEQ tablet Take 1 tablet (20 mEq total) by mouth 2 (two) times daily. 60 tablet 1   No current facility-administered medications for this visit.   Facility-Administered Medications Ordered in Other Visits  Medication Dose Route Frequency Provider Last Rate Last Dose  . sodium chloride 0.9 % injection 10 mL  10 mL Intravenous PRN Rulon Eisenmenger, MD   10 mL at 08/13/14 1610    PHYSICAL EXAMINATION: ECOG PERFORMANCE STATUS: 1 - Symptomatic but completely ambulatory  Filed Vitals:   08/13/14 0939  BP: 131/86  Pulse: 91  Temp: 98.6 F (37 C)  Resp: 20   Filed Weights   08/13/14 0939  Weight: 196 lb 4.8 oz (89.041 kg)    GENERAL:alert, no distress and comfortable SKIN: skin color, texture, turgor are normal, no rashes or significant lesions EYES: normal, Conjunctiva are pink and non-injected, sclera clear OROPHARYNX:no exudate, no erythema and lips, buccal mucosa, and tongue normal  NECK: supple, thyroid normal size, non-tender, without nodularity LYMPH:  no palpable lymphadenopathy in the cervical, axillary or inguinal LUNGS: clear to auscultation and percussion with normal breathing effort HEART: regular rate & rhythm and no murmurs and no lower extremity edema ABDOMEN:abdomen soft, non-tender and normal bowel sounds Musculoskeletal:no cyanosis of digits and no clubbing  NEURO: alert & oriented x 3 with fluent speech, no focal motor/sensory deficits  LABORATORY DATA:  I have reviewed the data as listed   Chemistry      Component Value Date/Time   NA 141 07/30/2014 1513   NA 138 04/12/2013 0715   K 3.5 07/30/2014 1513   K 3.3* 04/12/2013 0715   CL 104 04/12/2013 0715   CO2 24 07/30/2014 1513   CO2 25  04/12/2013 0715   BUN 8.4 07/30/2014 1513   BUN 8 04/12/2013 0715   CREATININE 0.8 07/30/2014 1513   CREATININE 0.69 04/12/2013 0715      Component Value Date/Time   CALCIUM 8.9 07/30/2014 1513   CALCIUM 8.8 04/12/2013 0715   ALKPHOS 98 07/30/2014 1513   ALKPHOS 112 04/04/2013 0927   AST 22 07/30/2014 1513   AST 30 04/04/2013 0927   ALT 24 07/30/2014 1513   ALT 31 04/04/2013 0927   BILITOT 0.39 07/30/2014 1513   BILITOT 0.5 04/04/2013 0927       Lab Results  Component Value Date   WBC 1.0* 08/13/2014   HGB 10.6* 08/13/2014   HCT 33.1* 08/13/2014   MCV 82.2 08/13/2014   PLT 166 08/13/2014   NEUTROABS 0.4* 08/13/2014    ASSESSMENT & PLAN:  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 Patient is currently on Ibrance, Faslodex, Del[place] See that started 06/15/14.  Patient experienced the following toxicities of treatment: 1. Severe neutropenia: After one week of therapy, ANC dropped down to 0.8 and we had to with hold Palbociclib and reduced the dosage to 100 mg daily. ANC 400 on  08/13/2014, Palbociclib dose reduced to 75 mg pills  2. Fatigue related to Palbociclib: Slightly improved with a lower dosage of Ibrance 3. UTI: resolved 4. Injection site discomfort related to Faslodex 5. Left hip and low back pain: I encouraged her to do stretching exercises. Continuing to monitor her closely for toxicities.  Plan: 1. Reduce Palbociclib to 75 mg and I encouraged her to start 08/20/2013 2. We will postpone the PET CT scan to be done after cycle 3 of treatment. 3. Today's treatment: Delay Faslodex Xgeva until next week.  return to clinic in 1 week for follow-up    Orders Placed This Encounter  Procedures  . CBC with Differential    Standing Status: Future     Number of Occurrences:      Standing Expiration Date: 08/13/2015   The  patient has a good understanding of the overall plan. she agrees with it. She will call with any problems that may develop before her next visit here.   Rulon Eisenmenger, MD 08/13/2014 10:00 AM

## 2014-08-13 NOTE — Assessment & Plan Note (Addendum)
[place] she iscer with bone metastases, lung, liver metastases, spleen lesions, and pleural irregularity ER 100%, PR 3%, HER-2 negative. Patient is currently on Ibrance, Faslodex, Delton See that started 06/15/14.  Patient experienced the following toxicities of treatment: 1. Severe neutropenia: After one week of therapy, ANC dropped down to 0.8 and we had to with hold Palbociclib and reduced the dosage to 100 mg daily. ANC 400 on 08/13/2014, Palbociclib dose reduced to 75 mg pills  2. Fatigue related to Palbociclib: Slightly improved with a lower dosage of Ibrance 3. UTI: resolved 4. Injection site discomfort related to Faslodex 5. Left hip and low back pain: I encouraged her to do stretching exercises. Continuing to monitor her closely for toxicities.  Plan: 1. Reduce Palbociclib to 75 mg and I encouraged her to start 08/20/2013 2. We will postpone the PET CT scan to be done after cycle 3 of treatment. 3. Today's treatment: Delay Faslodex Xgeva until next week.  return to clinic in 1 week for follow-up

## 2014-08-13 NOTE — Telephone Encounter (Signed)
per pof to sch pt appt*per [facility] move inj to 08/20/14

## 2014-08-13 NOTE — Patient Instructions (Signed)

## 2014-08-14 ENCOUNTER — Telehealth: Payer: Self-pay

## 2014-08-14 NOTE — Telephone Encounter (Signed)
Returned call to [place] at [facility] [phone] to confirm cycle for ibrance under new dose of 21 on/7 off

## 2014-08-20 ENCOUNTER — Telehealth: Payer: Self-pay | Admitting: Hematology and Oncology

## 2014-08-20 ENCOUNTER — Ambulatory Visit (HOSPITAL_BASED_OUTPATIENT_CLINIC_OR_DEPARTMENT_OTHER): Payer: BLUE CROSS/BLUE SHIELD

## 2014-08-20 ENCOUNTER — Ambulatory Visit (HOSPITAL_BASED_OUTPATIENT_CLINIC_OR_DEPARTMENT_OTHER): Payer: BLUE CROSS/BLUE SHIELD | Admitting: Hematology and Oncology

## 2014-08-20 ENCOUNTER — Other Ambulatory Visit: Payer: Self-pay | Admitting: Hematology and Oncology

## 2014-08-20 ENCOUNTER — Other Ambulatory Visit (HOSPITAL_BASED_OUTPATIENT_CLINIC_OR_DEPARTMENT_OTHER): Payer: BLUE CROSS/BLUE SHIELD

## 2014-08-20 DIAGNOSIS — C7951 Secondary malignant neoplasm of bone: Secondary | ICD-10-CM

## 2014-08-20 DIAGNOSIS — C787 Secondary malignant neoplasm of liver and intrahepatic bile duct: Secondary | ICD-10-CM | POA: Diagnosis not present

## 2014-08-20 DIAGNOSIS — C50912 Malignant neoplasm of unspecified site of left female breast: Secondary | ICD-10-CM

## 2014-08-20 DIAGNOSIS — Z5111 Encounter for antineoplastic chemotherapy: Secondary | ICD-10-CM

## 2014-08-20 DIAGNOSIS — C7889 Secondary malignant neoplasm of other digestive organs: Secondary | ICD-10-CM

## 2014-08-20 DIAGNOSIS — R5383 Other fatigue: Secondary | ICD-10-CM

## 2014-08-20 DIAGNOSIS — C7800 Secondary malignant neoplasm of unspecified lung: Secondary | ICD-10-CM | POA: Diagnosis not present

## 2014-08-20 DIAGNOSIS — C50919 Malignant neoplasm of unspecified site of unspecified female breast: Secondary | ICD-10-CM

## 2014-08-20 DIAGNOSIS — D709 Neutropenia, unspecified: Secondary | ICD-10-CM

## 2014-08-20 DIAGNOSIS — M25552 Pain in left hip: Secondary | ICD-10-CM

## 2014-08-20 DIAGNOSIS — M545 Low back pain: Secondary | ICD-10-CM

## 2014-08-20 LAB — CBC WITH DIFFERENTIAL/PLATELET
BASO%: 2 % (ref 0.0–2.0)
Basophils Absolute: 0 10*3/uL (ref 0.0–0.1)
EOS%: 2.7 % (ref 0.0–7.0)
Eosinophils Absolute: 0 10*3/uL (ref 0.0–0.5)
HEMATOCRIT: 36.1 % (ref 34.8–46.6)
HGB: 11.3 g/dL — ABNORMAL LOW (ref 11.6–15.9)
LYMPH#: 0.6 10*3/uL — AB (ref 0.9–3.3)
LYMPH%: 35.4 % (ref 14.0–49.7)
MCH: 26.2 pg (ref 25.1–34.0)
MCHC: 31.4 g/dL — AB (ref 31.5–36.0)
MCV: 83.5 fL (ref 79.5–101.0)
MONO#: 0.4 10*3/uL (ref 0.1–0.9)
MONO%: 25.1 % — ABNORMAL HIGH (ref 0.0–14.0)
NEUT#: 0.6 10*3/uL — ABNORMAL LOW (ref 1.5–6.5)
NEUT%: 34.8 % — ABNORMAL LOW (ref 38.4–76.8)
Platelets: 193 10*3/uL (ref 145–400)
RBC: 4.32 10*6/uL (ref 3.70–5.45)
RDW: 19.6 % — ABNORMAL HIGH (ref 11.2–14.5)
WBC: 1.7 10*3/uL — ABNORMAL LOW (ref 3.9–10.3)

## 2014-08-20 MED ORDER — FULVESTRANT 250 MG/5ML IM SOLN
500.0000 mg | INTRAMUSCULAR | Status: DC
  Administered 2014-08-20: 500 mg via INTRAMUSCULAR
  Filled 2014-08-20: qty 10

## 2014-08-20 MED ORDER — DENOSUMAB 120 MG/1.7ML ~~LOC~~ SOLN
120.0000 mg | Freq: Once | SUBCUTANEOUS | Status: AC
  Administered 2014-08-20: 120 mg via SUBCUTANEOUS
  Filled 2014-08-20: qty 1.7

## 2014-08-20 NOTE — Assessment & Plan Note (Signed)
Metastatic breast cancer with bone metastases, lung, liver metastases, spleen lesions, and pleural irregularity ER 100%, PR 3%, HER-2 negative. Patient is currently on Ibrance, Faslodex, Delton See that started 06/15/14.  Patient experienced the following toxicities of treatment: 1. Severe neutropenia: After one week of therapy, ANC dropped down to 0.8 and we had to with hold Palbociclib and reduced the dosage to 100 mg daily. ANC 400 on 08/13/2014, Palbociclib dose reduced to 75 mg pills (unable to restart this dose because ANC persistently low 600 on 08/20/2014)  2. Fatigue related to Palbociclib: Slightly improved with a lower dosage of Ibrance 3. UTI: resolved 4. Injection site discomfort related to Faslodex 5. Left hip and low back pain: I encouraged her to do stretching exercises. Continuing to monitor her closely for toxicities.  Plan: 1. Palbociclib to 75 mg (unable to restart this dosage because ANC is 600 today) return to clinic in 2 weeks for blood count check and follow-up 2. PET/CT scheduled for 09/03/2014. I will see her in January 19 to discuss the result 3. Today's treatment: Faslodex Xgeva.  return to clinic in 2 weeks  for follow-up

## 2014-08-20 NOTE — Telephone Encounter (Signed)
, °

## 2014-08-20 NOTE — Progress Notes (Signed)
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 She also has suspicious bone lesions but not easily accessible acc.to IR.  Therefore we are giving her the benefit of the doubt and treating her with curative intent. MRI suggests nodal involvement in the axilla.        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 Liver metastases   09/14/2012 Cancer Staging PET scan- multiple hypermetabolic areas left pectoral node the breast mass, left hilar, suprahilar, right hilar, right suprahilar, subcarinal lymph node, liver, right iliac, bilateral inguinal nodes, sternum lytic lesion    09/19/2012 Procedure Liver biopsy- Sarcoidosis   09/26/2012 - 01/10/2013 Chemotherapy AC x 4 followed by weekly Paclitaxel x 12. Marker response supraclavicular and mediastinal nodes decreasing the breast mass and liver lesions resolution of sternal activity   04/10/2013 Surgery Bilateral mastectomies, left decidual IDC grade 2 x 0.7 cm with high-grade DCIS LV I 8/11 lymph nodes positive with extracapsular extension ER 99% PR 40%, HER-2 negative. R: No cancer   04/12/2013 Procedure Genetic testing did not reveal BRCA mutations   05/22/2013 - 06/23/2013 Radiation Therapy Adjuvant radiation therapy with Xeloda   06/23/2013 -  Anti-estrogen oral therapy Tamoxifen 20 mg  daily with Zoladex every 3 months   05/28/2014 Relapse/Recurrence Bony metastatic disease: Sternum biopsy metastatic carcinoma, HER-2 negative ratio 0.74   06/15/2014 -  Anti-estrogen oral therapy Ibrance, faslodex, xgeva    CHIEF COMPLIANT: Follow-up for low blood counts related to Ibrance  INTERVAL HISTORY: ASHELYNN Avery is a 44 year old left marginated with above-mentioned history of stage IV breast cancer currently on Ibrance along with Faslodex and Xgeva. We cannot resume Ibrance because her blood counts remain persistently low. I suspect that this is related to prior chemotherapy treatments in addition to [place] being a profoundly myelosuppressive drug for the neutrophils. She denies any fevers or chills. She is otherwise feeling quite well. Has occasional bone aches and pains.  REVIEW OF SYSTEMS:   Constitutional: Denies fevers, chills or abnormal weight loss Eyes: Denies blurriness of vision Ears, nose, mouth, throat, and face: Denies mucositis or sore throat Respiratory: Denies cough, dyspnea or wheezes Cardiovascular: Denies palpitation, chest discomfort or lower extremity swelling Gastrointestinal:  Denies nausea, heartburn or change in bowel habits Skin: Denies abnormal skin rashes Lymphatics: Denies new lymphadenopathy or easy bruising Neurological:Denies numbness, tingling or new weaknesses Behavioral/Psych: Mood is stable, no new changes  All other systems were reviewed with the patient and are negative.  I have reviewed the past medical history, past surgical history, social history and family history with the patient and they are unchanged from previous note.  ALLERGIES:  is allergic to metronidazole and sulfonamide derivatives.  MEDICATIONS:  Current Outpatient Prescriptions  Medication Sig Dispense Refill  . fluticasone (  FLONASE) 50 MCG/ACT nasal spray Place 1-2 sprays into both nostrils once as needed for allergies or rhinitis.    Marland Kitchen lidocaine-prilocaine (EMLA) cream  Apply 1 application topically as needed. 30 g 0  . ondansetron (ZOFRAN) 8 MG tablet Take 1 tablet (8 mg total) by mouth every 8 (eight) hours as needed for nausea. 30 tablet 3  . palbociclib (IBRANCE) 75 MG capsule Take 1 capsule (75 mg total) by mouth daily with breakfast. Take whole with food. 21 capsule 1  . potassium chloride SA (K-DUR,KLOR-CON) 20 MEQ tablet Take 1 tablet (20 mEq total) by mouth 2 (two) times daily. 60 tablet 1   No current facility-administered medications for this visit.    PHYSICAL EXAMINATION: ECOG PERFORMANCE STATUS: 1 - Symptomatic but completely ambulatory  Filed Vitals:   08/20/14 0944  BP: 134/81  Pulse: 88  Temp: 98.2 F (36.8 C)  Resp: 18   Filed Weights   08/20/14 0944  Weight: 196 lb (88.905 kg)    GENERAL:alert, no distress and comfortable SKIN: skin color, texture, turgor are normal, no rashes or significant lesions EYES: normal, Conjunctiva are pink and non-injected, sclera clear OROPHARYNX:no exudate, no erythema and lips, buccal mucosa, and tongue normal  NECK: supple, thyroid normal size, non-tender, without nodularity LYMPH:  no palpable lymphadenopathy in the cervical, axillary or inguinal LUNGS: clear to auscultation and percussion with normal breathing effort HEART: regular rate & rhythm and no murmurs and no lower extremity edema ABDOMEN:abdomen soft, non-tender and normal bowel sounds Musculoskeletal:no cyanosis of digits and no clubbing  NEURO: alert & oriented x 3 with fluent speech, no focal motor/sensory deficits  LABORATORY DATA:  I have reviewed the data as listed   Chemistry      Component Value Date/Time   NA 141 08/13/2014 0906   NA 138 04/12/2013 0715   K 3.3* 08/13/2014 0906   K 3.3* 04/12/2013 0715   CL 104 04/12/2013 0715   CO2 26 08/13/2014 0906   CO2 25 04/12/2013 0715   BUN 9.9 08/13/2014 0906   BUN 8 04/12/2013 0715   CREATININE 0.8 08/13/2014 0906   CREATININE 0.69 04/12/2013 0715      Component  Value Date/Time   CALCIUM 9.0 08/13/2014 0906   CALCIUM 8.8 04/12/2013 0715   ALKPHOS 82 08/13/2014 0906   ALKPHOS 112 04/04/2013 0927   AST 19 08/13/2014 0906   AST 30 04/04/2013 0927   ALT 24 08/13/2014 0906   ALT 31 04/04/2013 0927   BILITOT 0.43 08/13/2014 0906   BILITOT 0.5 04/04/2013 0927       Lab Results  Component Value Date   WBC 1.7* 08/20/2014   HGB 11.3* 08/20/2014   HCT 36.1 08/20/2014   MCV 83.5 08/20/2014   PLT 193 08/20/2014   NEUTROABS 0.6* 08/20/2014   ASSESSMENT & PLAN:  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 Patient is currently on Ibrance, Faslodex, Delton See that started 06/15/14.  Patient experienced the following toxicities of treatment: 1. Severe neutropenia: After one week of therapy, ANC dropped down to 0.8 and we had to with hold Palbociclib and reduced the dosage to 100 mg daily. ANC 400 on 08/13/2014, Palbociclib dose reduced to 75 mg pills (unable to restart this dose because ANC persistently low 600 on 08/20/2014)  2. Fatigue related to Palbociclib: Slightly improved with a lower dosage of Ibrance 3. UTI: resolved 4.  Injection site discomfort related to Faslodex 5. Left hip and low back pain: I encouraged her to do stretching exercises. Continuing to monitor her closely for toxicities.  Plan: 1. Palbociclib to 75 mg (unable to restart this dosage because ANC is 600 today) return to clinic in 2 weeks for blood count check and follow-up 2. PET/CT scheduled for 09/03/2014. I will see her in January 19 to discuss the result 3. Today's treatment: Faslodex Xgeva.  return to clinic in 2 weeks  for follow-up    Orders Placed This Encounter  Procedures  . CBC with Differential    Standing Status: Future     Number of Occurrences:      Standing Expiration Date: 08/27/2015  . Comprehensive  metabolic panel (Cmet) - [facility]    Standing Status: Future     Number of Occurrences:      Standing Expiration Date: 09/13/2015   The patient has a good understanding of the overall plan. she agrees with it. She will call with any problems that may develop before her next visit here.   Rulon Eisenmenger, MD 08/20/2014 10:09 AM

## 2014-08-20 NOTE — Patient Instructions (Signed)
Fulvestrant injection What is this medicine? FULVESTRANT (ful VES trant) blocks the effects of estrogen. It is used to treat breast cancer in women past the age of menopause. This medicine may be used for other purposes; ask your health care provider or pharmacist if you have questions. COMMON BRAND NAME(S): FASLODEX What should I tell my health care provider before I take this medicine? They need to know if you have any of these conditions: -bleeding problems -liver disease -low levels of platelets in the blood -an unusual or allergic reaction to fulvestrant, other medicines, foods, dyes, or preservatives -pregnant or trying to get pregnant -breast-feeding How should I use this medicine? This medicine is for injection into a muscle. It is usually given by a health care professional in a hospital or clinic setting. Talk to your pediatrician regarding the use of this medicine in children. Special care may be needed. Overdosage: If you think you have taken too much of this medicine contact a poison control center or emergency room at once. NOTE: This medicine is only for you. Do not share this medicine with others. What if I miss a dose? It is important not to miss your dose. Call your doctor or health care professional if you are unable to keep an appointment. What may interact with this medicine? -medicines that treat or prevent blood clots like warfarin, enoxaparin, and dalteparin This list may not describe all possible interactions. Give your health care provider a list of all the medicines, herbs, non-prescription drugs, or dietary supplements you use. Also tell them if you smoke, drink alcohol, or use illegal drugs. Some items may interact with your medicine. What should I watch for while using this medicine? Your condition will be monitored carefully while you are receiving this medicine. You will need important blood work done while you are taking this medicine. Do not become pregnant  while taking this medicine. Women should inform their doctor if they wish to become pregnant or think they might be pregnant. There is a potential for serious side effects to an unborn child. Talk to your health care professional or pharmacist for more information. What side effects may I notice from receiving this medicine? Side effects that you should report to your doctor or health care professional as soon as possible: -allergic reactions like skin rash, itching or hives, swelling of the face, lips, or tongue -feeling faint or lightheaded, falls -fever or flu-like symptoms -sore throat -vaginal bleeding Side effects that usually do not require medical attention (report to your doctor or health care professional if they continue or are bothersome): -aches, pains -constipation or diarrhea -headache -hot flashes -nausea, vomiting -pain at site where injected -stomach pain This list may not describe all possible side effects. Call your doctor for medical advice about side effects. You may report side effects to FDA at 1-800-FDA-1088. Where should I keep my medicine? This drug is given in a hospital or clinic and will not be stored at home. NOTE: This sheet is a summary. It may not cover all possible information. If you have questions about this medicine, talk to your doctor, pharmacist, or health care provider.  2015, Elsevier/Gold Standard. (2007-12-12 15:39:24) Denosumab injection What is this medicine? DENOSUMAB (den oh sue mab) slows bone breakdown. Prolia is used to treat osteoporosis in women after menopause and in men. Delton See is used to prevent bone fractures and other bone problems caused by cancer bone metastases. Delton See is also used to treat giant cell tumor of the bone. This  medicine may be used for other purposes; ask your health care provider or pharmacist if you have questions. COMMON BRAND NAME(S): Prolia, XGEVA What should I tell my health care provider before I take this  medicine? They need to know if you have any of these conditions: -dental disease -eczema -infection or history of infections -kidney disease or on dialysis -low blood calcium or vitamin D -malabsorption syndrome -scheduled to have surgery or tooth extraction -taking medicine that contains denosumab -thyroid or parathyroid disease -an unusual reaction to denosumab, other medicines, foods, dyes, or preservatives -pregnant or trying to get pregnant -breast-feeding How should I use this medicine? This medicine is for injection under the skin. It is given by a health care professional in a hospital or clinic setting. If you are getting Prolia, a special MedGuide will be given to you by the pharmacist with each prescription and refill. Be sure to read this information carefully each time. For Prolia, talk to your pediatrician regarding the use of this medicine in children. Special care may be needed. For Delton See, talk to your pediatrician regarding the use of this medicine in children. While this drug may be prescribed for children as young as 13 years for selected conditions, precautions do apply. Overdosage: If you think you've taken too much of this medicine contact a poison control center or emergency room at once. Overdosage: If you think you have taken too much of this medicine contact a poison control center or emergency room at once. NOTE: This medicine is only for you. Do not share this medicine with others. What if I miss a dose? It is important not to miss your dose. Call your doctor or health care professional if you are unable to keep an appointment. What may interact with this medicine? Do not take this medicine with any of the following medications: -other medicines containing denosumab This medicine may also interact with the following medications: -medicines that suppress the immune system -medicines that treat cancer -steroid medicines like prednisone or cortisone This list  may not describe all possible interactions. Give your health care provider a list of all the medicines, herbs, non-prescription drugs, or dietary supplements you use. Also tell them if you smoke, drink alcohol, or use illegal drugs. Some items may interact with your medicine. What should I watch for while using this medicine? Visit your doctor or health care professional for regular checks on your progress. Your doctor or health care professional may order blood tests and other tests to see how you are doing. Call your doctor or health care professional if you get a cold or other infection while receiving this medicine. Do not treat yourself. This medicine may decrease your body's ability to fight infection. You should make sure you get enough calcium and vitamin D while you are taking this medicine, unless your doctor tells you not to. Discuss the foods you eat and the vitamins you take with your health care professional. See your dentist regularly. Brush and floss your teeth as directed. Before you have any dental work done, tell your dentist you are receiving this medicine. Do not become pregnant while taking this medicine or for 5 months after stopping it. Women should inform their doctor if they wish to become pregnant or think they might be pregnant. There is a potential for serious side effects to an unborn child. Talk to your health care professional or pharmacist for more information. What side effects may I notice from receiving this medicine? Side effects that you  should report to your doctor or health care professional as soon as possible: -allergic reactions like skin rash, itching or hives, swelling of the face, lips, or tongue -breathing problems -chest pain -fast, irregular heartbeat -feeling faint or lightheaded, falls -fever, chills, or any other sign of infection -muscle spasms, tightening, or twitches -numbness or tingling -skin blisters or bumps, or is dry, peels, or red -slow  healing or unexplained pain in the mouth or jaw -unusual bleeding or bruising Side effects that usually do not require medical attention (Report these to your doctor or health care professional if they continue or are bothersome.): -muscle pain -stomach upset, gas This list may not describe all possible side effects. Call your doctor for medical advice about side effects. You may report side effects to FDA at 1-800-FDA-1088. Where should I keep my medicine? This medicine is only given in a clinic, doctor's office, or other health care setting and will not be stored at home. NOTE: This sheet is a summary. It may not cover all possible information. If you have questions about this medicine, talk to your doctor, pharmacist, or health care provider.  2015, Elsevier/Gold Standard. (2012-02-01 12:37:47)

## 2014-08-22 ENCOUNTER — Other Ambulatory Visit: Payer: Self-pay | Admitting: *Deleted

## 2014-09-03 ENCOUNTER — Encounter (HOSPITAL_COMMUNITY): Payer: Self-pay

## 2014-09-03 ENCOUNTER — Encounter (HOSPITAL_COMMUNITY)
Admission: RE | Admit: 2014-09-03 | Discharge: 2014-09-03 | Disposition: A | Discharge status: Home / Self Care | Payer: BC Managed Care – PPO | Source: Ambulatory Visit | Attending: Nurse Practitioner | Admitting: Nurse Practitioner

## 2014-09-03 DIAGNOSIS — C799 Secondary malignant neoplasm of unspecified site: Secondary | ICD-10-CM | POA: Insufficient documentation

## 2014-09-03 DIAGNOSIS — C50919 Malignant neoplasm of unspecified site of unspecified female breast: Secondary | ICD-10-CM | POA: Insufficient documentation

## 2014-09-03 LAB — GLUCOSE, CAPILLARY: Glucose-Capillary: 106 mg/dL — ABNORMAL HIGH (ref 70–99)

## 2014-09-03 MED ORDER — FLUDEOXYGLUCOSE F - 18 (FDG) INJECTION
9.8000 | Freq: Once | INTRAVENOUS | Status: AC | PRN
  Administered 2014-09-03: 9.8 via INTRAVENOUS

## 2014-09-04 ENCOUNTER — Encounter: Payer: Self-pay | Admitting: *Deleted

## 2014-09-04 ENCOUNTER — Telehealth: Payer: Self-pay | Admitting: Hematology and Oncology

## 2014-09-04 ENCOUNTER — Ambulatory Visit (HOSPITAL_BASED_OUTPATIENT_CLINIC_OR_DEPARTMENT_OTHER): Payer: BLUE CROSS/BLUE SHIELD | Admitting: Hematology and Oncology

## 2014-09-04 ENCOUNTER — Other Ambulatory Visit (HOSPITAL_BASED_OUTPATIENT_CLINIC_OR_DEPARTMENT_OTHER): Payer: BLUE CROSS/BLUE SHIELD

## 2014-09-04 VITALS — BP 138/82 | HR 89 | Temp 98.4°F | Resp 19 | Ht 64.0 in | Wt 191.2 lb

## 2014-09-04 DIAGNOSIS — C50411 Malignant neoplasm of upper-outer quadrant of right female breast: Secondary | ICD-10-CM | POA: Diagnosis not present

## 2014-09-04 DIAGNOSIS — C7800 Secondary malignant neoplasm of unspecified lung: Secondary | ICD-10-CM | POA: Diagnosis not present

## 2014-09-04 DIAGNOSIS — C787 Secondary malignant neoplasm of liver and intrahepatic bile duct: Secondary | ICD-10-CM | POA: Diagnosis not present

## 2014-09-04 DIAGNOSIS — C7951 Secondary malignant neoplasm of bone: Secondary | ICD-10-CM

## 2014-09-04 DIAGNOSIS — C50912 Malignant neoplasm of unspecified site of left female breast: Secondary | ICD-10-CM

## 2014-09-04 LAB — COMPREHENSIVE METABOLIC PANEL (CC13)
ALBUMIN: 3.5 g/dL (ref 3.5–5.0)
ALT: 28 U/L (ref 0–55)
AST: 30 U/L (ref 5–34)
Alkaline Phosphatase: 90 U/L (ref 40–150)
Anion Gap: 9 mEq/L (ref 3–11)
BUN: 6.7 mg/dL — AB (ref 7.0–26.0)
CO2: 24 mEq/L (ref 22–29)
Calcium: 8.3 mg/dL — ABNORMAL LOW (ref 8.4–10.4)
Chloride: 108 mEq/L (ref 98–109)
Creatinine: 0.8 mg/dL (ref 0.6–1.1)
EGFR: >90 mL/min/{1.73_m2} (ref >90)
Glucose: 168 mg/dl — ABNORMAL HIGH (ref 70–140)
POTASSIUM: 3.3 meq/L — AB (ref 3.5–5.1)
Sodium: 140 mEq/L (ref 136–145)
Total Bilirubin: 0.43 mg/dL (ref 0.20–1.20)
Total Protein: 7.4 g/dL (ref 6.4–8.3)

## 2014-09-04 LAB — CBC WITH DIFFERENTIAL/PLATELET
BASO%: 0.7 % (ref 0.0–2.0)
BASOS ABS: 0 10*3/uL (ref 0.0–0.1)
EOS%: 8 % — ABNORMAL HIGH (ref 0.0–7.0)
Eosinophils Absolute: 0.2 10*3/uL (ref 0.0–0.5)
HEMATOCRIT: 35.4 % (ref 34.8–46.6)
HEMOGLOBIN: 11.6 g/dL (ref 11.6–15.9)
LYMPH%: 29.1 % (ref 14.0–49.7)
MCH: 26.5 pg (ref 25.1–34.0)
MCHC: 32.8 g/dL (ref 31.5–36.0)
MCV: 80.8 fL (ref 79.5–101.0)
MONO#: 0.3 10*3/uL (ref 0.1–0.9)
MONO%: 11.6 % (ref 0.0–14.0)
NEUT%: 50.6 % (ref 38.4–76.8)
NEUTROS ABS: 1.4 10*3/uL — AB (ref 1.5–6.5)
PLATELETS: 239 10*3/uL (ref 145–400)
RBC: 4.38 10*6/uL (ref 3.70–5.45)
RDW: 17.2 % — ABNORMAL HIGH (ref 11.2–14.5)
WBC: 2.8 10*3/uL — AB (ref 3.9–10.3)
lymph#: 0.8 10*3/uL — ABNORMAL LOW (ref 0.9–3.3)

## 2014-09-04 MED ORDER — AMOXICILLIN 500 MG PO CAPS
500.0000 mg | ORAL_CAPSULE | Freq: Three times a day (TID) | ORAL | Status: DC
Start: 2014-09-04 — End: 2014-11-12

## 2014-09-04 NOTE — Assessment & Plan Note (Addendum)
Metastatic breast cancer with bone metastases, lung, liver metastases, spleen lesions, and pleural irregularity ER 100%, PR 3%, HER-2 negative. Patient is currently on Ibrance, Faslodex, Delton See that started 06/15/14.  Patient experienced the following toxicities of treatment: 1. Severe neutropenia: After one week of therapy, ANC dropped down to 0.8 and we had to with hold Palbociclib and reduced the dosage to 100 mg daily. ANC 400 on 08/13/2014, Palbociclib dose reduced to 75 mg pills (unable to restart this dose because ANC persistently low 600 on 08/20/2014) today ANC is 1400, patient will restart Ibrance 2. Fatigue related to Palbociclib: Slightly improved with a lower dosage of Ibrance 3. UTI: resolved 4. Injection site discomfort related to Faslodex: Encouraged the patient to apply EMLA cream 5. Left hip and low back pain: I encouraged her to do stretching exercises. 6. Bronchitis: No improvement over the past 2 weeks. Prescribed amoxicillin twice a day for 1 week Continuing to monitor her closely for toxicities.  Plan: 1. Palbociclib to 75 mg. 2. PET/CT 09/03/2014: Showed stable disease. Several abnormalities noted especially in the spleen and liver were felt to be suspicious for sarcoidosis. Previously we had biopsied a sternal lesion which was diagnostic for breast cancer. Overall stable disease. Hence I recommended continuing therapy with monthly Faslodex and Ibrance daily. In the future, if the patient wishes to she can see Dr. Whitney Muse and for medical oncology at [facility] [place]  Return to clinic February 1 for Faslodex injection and blood work. Clinic follow-up February 29 with blood work.

## 2014-09-04 NOTE — Progress Notes (Signed)
Called [employer]. Patient to restart Ibrance at 75 mg, 21 days on/7 days off. [facility] to call patient to arrange delivery date.

## 2014-09-04 NOTE — Telephone Encounter (Signed)
, °

## 2014-09-04 NOTE — Progress Notes (Signed)
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 She also has suspicious bone lesions but not easily accessible acc.to IR.  Therefore we are giving her the benefit of the doubt and treating her with curative intent. MRI suggests nodal involvement in the axilla.        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 Liver metastases   09/14/2012 Cancer Staging PET scan- multiple hypermetabolic areas left pectoral node the breast mass, left hilar, suprahilar, right hilar, right suprahilar, subcarinal lymph node, liver, right iliac, bilateral inguinal nodes, sternum lytic lesion    09/19/2012 Procedure Liver biopsy- Sarcoidosis   09/26/2012 - 01/10/2013 Chemotherapy AC x 4 followed by weekly Paclitaxel x 12. Marker response supraclavicular and mediastinal nodes decreasing the breast mass and liver lesions resolution of sternal activity   04/10/2013 Surgery Bilateral mastectomies, left decidual IDC grade 2 x 0.7 cm with high-grade DCIS LV I 8/11 lymph nodes positive with extracapsular extension ER 99% PR 40%, HER-2 negative. R: No cancer   04/12/2013 Procedure Genetic testing did not reveal BRCA mutations   05/22/2013 - 06/23/2013 Radiation Therapy Adjuvant radiation therapy with Xeloda   06/23/2013 -  Anti-estrogen oral therapy Tamoxifen  20 mg daily with Zoladex every 3 months   05/28/2014 Relapse/Recurrence Bony metastatic disease: Sternum biopsy metastatic carcinoma, HER-2 negative ratio 0.74   06/15/2014 -  Anti-estrogen oral therapy Ibrance, faslodex, xgeva    CHIEF COMPLIANT: Follow-up of PET CT scan  INTERVAL HISTORY: Brenda Avery is a 44 year old lady with above-mentioned history of metastatic breast cancer with bone metastases. She also has a prior history of sarcoidosis involving lungs liver and spleen. She had a recent PET CT scan to assess response to Faslodex and Ibrance. She is very nervous about the result. Her only complaint is some cough for the past 2 weeks it is not resolving and it is associated with productive yellow sputum. Denies any fevers or chills.  REVIEW OF SYSTEMS:   Constitutional: Denies fevers, chills or abnormal weight loss Eyes: Denies blurriness of vision Ears, nose, mouth, throat, and face: Denies mucositis or sore throat Respiratory: Denies cough, dyspnea or wheezes Cardiovascular: Denies palpitation, chest discomfort or lower extremity swelling Gastrointestinal:  Denies nausea, heartburn or change in bowel habits Skin: Denies abnormal skin rashes Lymphatics: Denies new lymphadenopathy or easy bruising Neurological:Denies numbness, tingling or new weaknesses Behavioral/Psych: Mood is stable, no new changes  All other systems were reviewed with the patient and are negative.  I have reviewed the past medical history, past surgical history, social history and family history with the patient and they are unchanged from previous note.  ALLERGIES:  is allergic to metronidazole and sulfonamide derivatives.  MEDICATIONS:  Current Outpatient Prescriptions  Medication Sig Dispense Refill  .  fluticasone (FLONASE) 50 MCG/ACT nasal spray Place 1-2 sprays into both nostrils once as needed for allergies or rhinitis.    Marland Kitchen lidocaine-prilocaine (EMLA) cream Apply 1 application topically as needed. 30  g 0  . ondansetron (ZOFRAN) 8 MG tablet Take 1 tablet (8 mg total) by mouth every 8 (eight) hours as needed for nausea. 30 tablet 3  . potassium chloride SA (K-DUR,KLOR-CON) 20 MEQ tablet Take 1 tablet (20 mEq total) by mouth 2 (two) times daily. 60 tablet 1  . amoxicillin (AMOXIL) 500 MG capsule Take 1 capsule (500 mg total) by mouth 3 (three) times daily. 14 capsule 0  . palbociclib (IBRANCE) 75 MG capsule Take 1 capsule (75 mg total) by mouth daily with breakfast. Take whole with food. (Patient not taking: Reported on 09/04/2014) 21 capsule 1   No current facility-administered medications for this visit.    PHYSICAL EXAMINATION: ECOG PERFORMANCE STATUS: 1 - Symptomatic but completely ambulatory  Filed Vitals:   09/04/14 0925  BP: 138/82  Pulse: 89  Temp: 98.4 F (36.9 C)  Resp: 19   Filed Weights   09/04/14 0925  Weight: 191 lb 3.2 oz (86.728 kg)    GENERAL:alert, no distress and comfortable SKIN: skin color, texture, turgor are normal, no rashes or significant lesions EYES: normal, Conjunctiva are pink and non-injected, sclera clear OROPHARYNX:no exudate, no erythema and lips, buccal mucosa, and tongue normal  NECK: supple, thyroid normal size, non-tender, without nodularity LYMPH:  no palpable lymphadenopathy in the cervical, axillary or inguinal LUNGS: clear to auscultation and percussion with normal breathing effort HEART: regular rate & rhythm and no murmurs and no lower extremity edema ABDOMEN:abdomen soft, non-tender and normal bowel sounds Musculoskeletal:no cyanosis of digits and no clubbing  NEURO: alert & oriented x 3 with fluent speech, no focal motor/sensory deficits LABORATORY DATA:  I have reviewed the data as listed   Chemistry      Component Value Date/Time   NA 141 08/13/2014 0906   NA 138 04/12/2013 0715   K 3.3* 08/13/2014 0906   K 3.3* 04/12/2013 0715   CL 104 04/12/2013 0715   CO2 26 08/13/2014 0906   CO2 25 04/12/2013 0715   BUN 9.9 08/13/2014  0906   BUN 8 04/12/2013 0715   CREATININE 0.8 08/13/2014 0906   CREATININE 0.69 04/12/2013 0715      Component Value Date/Time   CALCIUM 9.0 08/13/2014 0906   CALCIUM 8.8 04/12/2013 0715   ALKPHOS 82 08/13/2014 0906   ALKPHOS 112 04/04/2013 0927   AST 19 08/13/2014 0906   AST 30 04/04/2013 0927   ALT 24 08/13/2014 0906   ALT 31 04/04/2013 0927   BILITOT 0.43 08/13/2014 0906   BILITOT 0.5 04/04/2013 0927       Lab Results  Component Value Date   WBC 2.8* 09/04/2014   HGB 11.6 09/04/2014   HCT 35.4 09/04/2014   MCV 80.8 09/04/2014   PLT 239 09/04/2014   NEUTROABS 1.4* 09/04/2014     RADIOGRAPHIC STUDIES: I have personally reviewed the radiology reports and agreed with their findings. Nm Pet Image Restag (ps) Skull Base To Thigh  09/03/2014   CLINICAL DATA:  Subsequent treatment strategy for breast carcinoma.  EXAM: NUCLEAR MEDICINE PET SKULL BASE TO THIGH  TECHNIQUE: 9.8 mCi F-18 FDG was injected intravenously. Full-ring PET imaging was performed from the skull base to thigh after the radiotracer. CT data was obtained and used for attenuation correction and anatomic localization.  FASTING BLOOD GLUCOSE:  Value: 105  mg/dl  COMPARISON:  PET-CT scan 04/18/2014, 02/13/2013, 09/14/2012 with  FINDINGS: NECK  No hypermetabolic lymph nodes in the neck.  CHEST  Mildly hypermetabolic hilar lymph nodes again demonstrated. There is pleural nodularity along the right oblique fissure unchanged from prior (image 26, series 8). Nodule along the left oblique fissure measuring 4 mm is also similar to prior. No clear new nodularity.  ABDOMEN/PELVIS  There is diffuse increased in metabolic activity the liver similar prior. There is focal increased metabolic activity in the posterior right hepatic lobe with SUV max equal 11.6 compared to 9.4 on comparison exam.  There is multiple foci of increased metabolic activity in the spleen.  There are small scattered hypermetabolic retroperitoneal periaortic lymph  nodes. There is hypermetabolic external iliac lymph nodes along the obturator space similar prior. The right external iliac lymph node measures 9 .4 SUV max compared to 7.5 Noted view hypermetabolic lymph nodes.  SKELETON  Focus of metabolic activity associated with the lytic lesion within the right aspect of the manubrium with SUV max equal 5.0 compared to 5.7. Focus of metabolic of uptake within the left sacral ala with SUV max 8.0 increased from 4.5. Left iliac lesion with SUV max 5.6 decrease of 7.5  IMPRESSION: 1. Overall relatively stable exam compared to comparison FDG PET scan 04/18/2014. 2. Hypermetabolic lesion in the posterior aspect of the liver similar to prior. This concerning for metastasis. 3. Hypermetabolic skeletal metastasis with similar activity compared to prior. 4. Hypermetabolic spleen with scattered hypermetabolic hilar, retroperitoneal, and iliac lymph nodes. This raise the possibility of sarcoidosis. 5. Diffuse hypermetabolic activity within the liver could indicate hepatic sarcoidosis.   Electronically Signed   By: Suzy Bouchard M.D.   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 Patient is currently on Ibrance, Faslodex, Delton See that started 06/15/14.  Patient experienced the following toxicities of treatment: 1. Severe neutropenia: After one week of therapy, ANC dropped down to 0.8 and we had to with hold Palbociclib and reduced the dosage to 100 mg daily. ANC 400 on 08/13/2014, Palbociclib dose reduced to 75 mg pills (unable to restart this dose because ANC persistently low 600 on 08/20/2014) today ANC is 1400, patient will restart Ibrance 2. Fatigue related to Palbociclib: Slightly improved with a lower dosage of Ibrance 3. UTI: resolved 4. Injection site discomfort related to  Faslodex: Encouraged the patient to apply EMLA cream 5. Left hip and low back pain: I encouraged her to do stretching exercises. 6. Bronchitis: No improvement over the past 2 weeks. Prescribed amoxicillin twice a day for 1 week Continuing to monitor her closely for toxicities.  Plan: 1. Palbociclib to 75 mg. 2. PET/CT 09/03/2014: Showed stable disease. Several abnormalities noted especially in the spleen and liver were felt to be suspicious for sarcoidosis. Previously we had biopsied a sternal lesion which was diagnostic for breast cancer. Overall stable disease. Hence I recommended continuing therapy with monthly Faslodex and Ibrance daily. In the future, if the patient wishes to she can see Dr. Whitney Muse and for medical oncology at [facility] [place]  Return to clinic February 1 for Faslodex injection and blood work. Clinic follow-up February 29 with blood work.   No orders of the defined types were placed in this encounter.   The patient has a good understanding of the overall  plan. she agrees with it. She will call with any problems that may develop before her next visit here.   Rulon Eisenmenger, MD

## 2014-09-17 ENCOUNTER — Other Ambulatory Visit (HOSPITAL_BASED_OUTPATIENT_CLINIC_OR_DEPARTMENT_OTHER): Payer: BLUE CROSS/BLUE SHIELD

## 2014-09-17 ENCOUNTER — Ambulatory Visit (HOSPITAL_BASED_OUTPATIENT_CLINIC_OR_DEPARTMENT_OTHER): Payer: BLUE CROSS/BLUE SHIELD

## 2014-09-17 DIAGNOSIS — C50919 Malignant neoplasm of unspecified site of unspecified female breast: Secondary | ICD-10-CM

## 2014-09-17 DIAGNOSIS — C50412 Malignant neoplasm of upper-outer quadrant of left female breast: Secondary | ICD-10-CM

## 2014-09-17 DIAGNOSIS — Z5111 Encounter for antineoplastic chemotherapy: Secondary | ICD-10-CM | POA: Diagnosis not present

## 2014-09-17 DIAGNOSIS — Z95828 Presence of other vascular implants and grafts: Secondary | ICD-10-CM

## 2014-09-17 DIAGNOSIS — C7951 Secondary malignant neoplasm of bone: Secondary | ICD-10-CM | POA: Diagnosis not present

## 2014-09-17 DIAGNOSIS — C787 Secondary malignant neoplasm of liver and intrahepatic bile duct: Secondary | ICD-10-CM

## 2014-09-17 DIAGNOSIS — C7800 Secondary malignant neoplasm of unspecified lung: Secondary | ICD-10-CM

## 2014-09-17 DIAGNOSIS — C50912 Malignant neoplasm of unspecified site of left female breast: Secondary | ICD-10-CM

## 2014-09-17 LAB — CBC WITH DIFFERENTIAL/PLATELET
BASO%: 0.9 % (ref 0.0–2.0)
Basophils Absolute: 0 10*3/uL (ref 0.0–0.1)
EOS ABS: 0.3 10*3/uL (ref 0.0–0.5)
EOS%: 9.9 % — ABNORMAL HIGH (ref 0.0–7.0)
HEMATOCRIT: 35.2 % (ref 34.8–46.6)
HGB: 11.2 g/dL — ABNORMAL LOW (ref 11.6–15.9)
LYMPH%: 24.2 % (ref 14.0–49.7)
MCH: 26.1 pg (ref 25.1–34.0)
MCHC: 31.8 g/dL (ref 31.5–36.0)
MCV: 82.1 fL (ref 79.5–101.0)
MONO#: 0.4 10*3/uL (ref 0.1–0.9)
MONO%: 11.8 % (ref 0.0–14.0)
NEUT%: 53.2 % (ref 38.4–76.8)
NEUTROS ABS: 1.7 10*3/uL (ref 1.5–6.5)
PLATELETS: 234 10*3/uL (ref 145–400)
RBC: 4.29 10*6/uL (ref 3.70–5.45)
RDW: 17.7 % — ABNORMAL HIGH (ref 11.2–14.5)
WBC: 3.2 10*3/uL — ABNORMAL LOW (ref 3.9–10.3)
lymph#: 0.8 10*3/uL — ABNORMAL LOW (ref 0.9–3.3)

## 2014-09-17 MED ORDER — FULVESTRANT 250 MG/5ML IM SOLN
500.0000 mg | INTRAMUSCULAR | Status: DC
  Administered 2014-09-17: 500 mg via INTRAMUSCULAR
  Filled 2014-09-17: qty 10

## 2014-09-17 MED ORDER — HEPARIN SOD (PORK) LOCK FLUSH 100 UNIT/ML IV SOLN
500.0000 [IU] | Freq: Once | INTRAVENOUS | Status: AC
  Administered 2014-09-17: 500 [IU] via INTRAVENOUS
  Filled 2014-09-17: qty 5

## 2014-09-17 MED ORDER — SODIUM CHLORIDE 0.9 % IJ SOLN
10.0000 mL | INTRAMUSCULAR | Status: DC | PRN
  Administered 2014-09-17: 10 mL via INTRAVENOUS
  Filled 2014-09-17: qty 10

## 2014-09-17 MED ORDER — DENOSUMAB 120 MG/1.7ML ~~LOC~~ SOLN
120.0000 mg | Freq: Once | SUBCUTANEOUS | Status: AC
  Administered 2014-09-17: 120 mg via SUBCUTANEOUS
  Filled 2014-09-17: qty 1.7

## 2014-09-17 NOTE — Patient Instructions (Signed)

## 2014-09-24 ENCOUNTER — Other Ambulatory Visit: Payer: Self-pay | Admitting: Nurse Practitioner

## 2014-10-15 ENCOUNTER — Other Ambulatory Visit (HOSPITAL_BASED_OUTPATIENT_CLINIC_OR_DEPARTMENT_OTHER): Payer: BLUE CROSS/BLUE SHIELD

## 2014-10-15 ENCOUNTER — Other Ambulatory Visit: Payer: Self-pay | Admitting: *Deleted

## 2014-10-15 ENCOUNTER — Telehealth: Payer: Self-pay | Admitting: Hematology and Oncology

## 2014-10-15 ENCOUNTER — Ambulatory Visit (HOSPITAL_BASED_OUTPATIENT_CLINIC_OR_DEPARTMENT_OTHER): Payer: BLUE CROSS/BLUE SHIELD

## 2014-10-15 ENCOUNTER — Ambulatory Visit (HOSPITAL_BASED_OUTPATIENT_CLINIC_OR_DEPARTMENT_OTHER): Payer: BLUE CROSS/BLUE SHIELD | Admitting: Hematology and Oncology

## 2014-10-15 VITALS — BP 141/87 | HR 88 | Temp 98.6°F

## 2014-10-15 DIAGNOSIS — C50919 Malignant neoplasm of unspecified site of unspecified female breast: Secondary | ICD-10-CM

## 2014-10-15 DIAGNOSIS — D6481 Anemia due to antineoplastic chemotherapy: Secondary | ICD-10-CM

## 2014-10-15 DIAGNOSIS — C50912 Malignant neoplasm of unspecified site of left female breast: Secondary | ICD-10-CM

## 2014-10-15 DIAGNOSIS — D702 Other drug-induced agranulocytosis: Secondary | ICD-10-CM

## 2014-10-15 DIAGNOSIS — J40 Bronchitis, not specified as acute or chronic: Secondary | ICD-10-CM

## 2014-10-15 DIAGNOSIS — C7951 Secondary malignant neoplasm of bone: Secondary | ICD-10-CM

## 2014-10-15 DIAGNOSIS — C50412 Malignant neoplasm of upper-outer quadrant of left female breast: Secondary | ICD-10-CM

## 2014-10-15 DIAGNOSIS — R530 Neoplastic (malignant) related fatigue: Secondary | ICD-10-CM

## 2014-10-15 DIAGNOSIS — Z5111 Encounter for antineoplastic chemotherapy: Secondary | ICD-10-CM

## 2014-10-15 LAB — CBC WITH DIFFERENTIAL/PLATELET
BASO%: 1.3 % (ref 0.0–2.0)
Basophils Absolute: 0 10*3/uL (ref 0.0–0.1)
EOS ABS: 0.1 10*3/uL (ref 0.0–0.5)
EOS%: 3.8 % (ref 0.0–7.0)
HCT: 34.4 % — ABNORMAL LOW (ref 34.8–46.6)
HGB: 10.8 g/dL — ABNORMAL LOW (ref 11.6–15.9)
LYMPH%: 29.8 % (ref 14.0–49.7)
MCH: 26.2 pg (ref 25.1–34.0)
MCHC: 31.3 g/dL — AB (ref 31.5–36.0)
MCV: 83.8 fL (ref 79.5–101.0)
MONO#: 0.1 10*3/uL (ref 0.1–0.9)
MONO%: 7.5 % (ref 0.0–14.0)
NEUT%: 57.6 % (ref 38.4–76.8)
NEUTROS ABS: 0.9 10*3/uL — AB (ref 1.5–6.5)
PLATELETS: 199 10*3/uL (ref 145–400)
RBC: 4.11 10*6/uL (ref 3.70–5.45)
RDW: 16.8 % — ABNORMAL HIGH (ref 11.2–14.5)
WBC: 1.6 10*3/uL — ABNORMAL LOW (ref 3.9–10.3)
lymph#: 0.5 10*3/uL — ABNORMAL LOW (ref 0.9–3.3)

## 2014-10-15 LAB — COMPREHENSIVE METABOLIC PANEL (CC13)
ALT: 20 U/L (ref 0–55)
AST: 22 U/L (ref 5–34)
Albumin: 3.4 g/dL — ABNORMAL LOW (ref 3.5–5.0)
Alkaline Phosphatase: 88 U/L (ref 40–150)
Anion Gap: 8 mEq/L (ref 3–11)
BUN: 12.1 mg/dL (ref 7.0–26.0)
CALCIUM: 9.1 mg/dL (ref 8.4–10.4)
CHLORIDE: 108 meq/L (ref 98–109)
CO2: 26 mEq/L (ref 22–29)
Creatinine: 0.9 mg/dL (ref 0.6–1.1)
EGFR: 89 mL/min/{1.73_m2} — ABNORMAL LOW (ref >90)
Glucose: 112 mg/dl (ref 70–140)
POTASSIUM: 3.4 meq/L — AB (ref 3.5–5.1)
SODIUM: 142 meq/L (ref 136–145)
Total Bilirubin: 0.53 mg/dL (ref 0.20–1.20)
Total Protein: 7.6 g/dL (ref 6.4–8.3)

## 2014-10-15 MED ORDER — DENOSUMAB 120 MG/1.7ML ~~LOC~~ SOLN
120.0000 mg | Freq: Once | SUBCUTANEOUS | Status: AC
  Administered 2014-10-15: 120 mg via SUBCUTANEOUS
  Filled 2014-10-15: qty 1.7

## 2014-10-15 MED ORDER — FULVESTRANT 250 MG/5ML IM SOLN
500.0000 mg | INTRAMUSCULAR | Status: DC
  Administered 2014-10-15: 500 mg via INTRAMUSCULAR
  Filled 2014-10-15: qty 10

## 2014-10-15 NOTE — Assessment & Plan Note (Signed)
Metastatic breast cancer with bone metastases, lung, liver metastases, spleen lesions, and pleural irregularity ER 100%, PR 3%, HER-2 negative. Patient is currently on Ibrance, Faslodex, Xgeva that started 06/15/14.  Patient experienced the following toxicities of treatment: 1. Severe neutropenia: After one week of therapy, ANC dropped down to 0.8 and we had to with hold Palbociclib and reduced the dosage to 100 mg daily. ANC 400 on 08/13/2014, Palbociclib dose reduced to 75 mg pills today ANC is 900,  Plan to change Ibrance to 75 mg Monday through Friday and be off on weekends 2. Fatigue related to Palbociclib: Slightly improved with a lower dosage of Ibrance 3. UTI: resolved 4. Injection site discomfort related to Faslodex: Encouraged the patient to apply EMLA cream 5. Left hip and low back pain: I encouraged her to do stretching exercises. 6. Bronchitis: No improvement over the past 2 weeks. Prescribed amoxicillin twice a day for 1 week Continuing to monitor her closely for toxicities. 7.  Ibrance  induced anemia  Grade 1  Plan: 1. Palbociclib to 75 mg Mon-Friday and be off on weekends. 2.  Restaging CT scans end of April 2016  Return to clinic in one month for blood count check. 

## 2014-10-15 NOTE — Telephone Encounter (Signed)
MD visit added per 02/29 POF, pt is here in the office..... KJ

## 2014-10-15 NOTE — Progress Notes (Signed)
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 She also has suspicious bone lesions but not easily accessible acc.to IR.  Therefore we are giving her the benefit of the doubt and treating her with curative intent. MRI suggests nodal involvement in the axilla.        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 Liver metastases   09/14/2012 Cancer Staging PET scan- multiple hypermetabolic areas left pectoral node the breast mass, left hilar, suprahilar, right hilar, right suprahilar, subcarinal lymph node, liver, right iliac, bilateral inguinal nodes, sternum lytic lesion    09/19/2012 Procedure Liver biopsy- Sarcoidosis   09/26/2012 - 01/10/2013 Chemotherapy AC x 4 followed by weekly Paclitaxel x 12. Marker response supraclavicular and mediastinal nodes decreasing the breast mass and liver lesions resolution of sternal activity   04/10/2013 Surgery Bilateral mastectomies, left decidual IDC grade 2 x 0.7 cm with high-grade DCIS LV I 8/11 lymph nodes positive with extracapsular extension ER 99% PR 40%, HER-2 negative. R: No cancer   04/12/2013 Procedure Genetic testing did not reveal BRCA mutations   05/22/2013 - 06/23/2013 Radiation Therapy Adjuvant radiation therapy with Xeloda   06/23/2013 -  Anti-estrogen oral therapy Tamoxifen  20 mg daily with Zoladex every 3 months   05/28/2014 Relapse/Recurrence Bony metastatic disease: Sternum biopsy metastatic carcinoma, HER-2 negative ratio 0.74   06/15/2014 -  Anti-estrogen oral therapy Ibrance, faslodex, xgeva    CHIEF COMPLIANT: follow-up on Faslodex and Ibrance with X geva  INTERVAL HISTORY: Brenda Avery is a 44 year old lady with above-mentioned history metastatic breast cancer with bony metastatic disease was currently on Faslodex with Ibrance and X Geva. Apart from injection site discomfort and some fatigue, she is tolerating this treatment fairly well. She had profound neutropenia and neutropenia and Ibrance dosage has to be reduced to 75 mg. Even at this dosage it appears that she is neutropenic today. She denies any fevers or chills or nausea or vomiting.  REVIEW OF SYSTEMS:   Constitutional: Denies fevers, chills or abnormal weight loss Eyes: Denies blurriness of vision Ears, nose, mouth, throat, and face: Denies mucositis or sore throat Respiratory: Denies cough, dyspnea or wheezes Cardiovascular: Denies palpitation, chest discomfort or lower extremity swelling Gastrointestinal:  Denies nausea, heartburn or change in bowel habits Skin: Denies abnormal skin rashes Lymphatics: Denies new lymphadenopathy or easy bruising Neurological:Denies numbness, tingling or new weaknesses Behavioral/Psych: Mood is stable, no new changes  Breast:  denies any pain or lumps or nodules in either breasts All other systems were reviewed with the patient and are negative.  I have reviewed the past medical history, past surgical history, social history and family history with the patient and they are unchanged from previous note.  ALLERGIES:  is allergic to metronidazole  and sulfonamide derivatives.  MEDICATIONS:  Current Outpatient Prescriptions  Medication Sig Dispense Refill  . amoxicillin (AMOXIL) 500 MG capsule Take 1 capsule (500 mg total) by mouth 3 (three) times daily. 14  capsule 0  . fluticasone (FLONASE) 50 MCG/ACT nasal spray Place 1-2 sprays into both nostrils once as needed for allergies or rhinitis.    Marland Kitchen lidocaine-prilocaine (EMLA) cream Apply 1 application topically as needed. 30 g 0  . ondansetron (ZOFRAN) 8 MG tablet Take 1 tablet (8 mg total) by mouth every 8 (eight) hours as needed for nausea. 30 tablet 3  . palbociclib (IBRANCE) 75 MG capsule Take 1 capsule (75 mg total) by mouth daily with breakfast. Take whole with food. 21 capsule 1  . potassium chloride SA (K-DUR,KLOR-CON) 20 MEQ tablet Take 1 tablet (20 mEq total) by mouth 2 (two) times daily. 60 tablet 1   No current facility-administered medications for this visit.    PHYSICAL EXAMINATION: ECOG PERFORMANCE STATUS: 1 - Symptomatic but completely ambulatory  Filed Vitals:   10/15/14 1122  BP: 144/88  Pulse: 88  Temp: 98.6 F (37 C)  Resp: 18   Filed Weights   10/15/14 1122  Weight: 191 lb (86.637 kg)    GENERAL:alert, no distress and comfortable SKIN: skin color, texture, turgor are normal, no rashes or significant lesions EYES: normal, Conjunctiva are pink and non-injected, sclera clear OROPHARYNX:no exudate, no erythema and lips, buccal mucosa, and tongue normal  NECK: supple, thyroid normal size, non-tender, without nodularity LYMPH:  no palpable lymphadenopathy in the cervical, axillary or inguinal LUNGS: clear to auscultation and percussion with normal breathing effort HEART: regular rate & rhythm and no murmurs and no lower extremity edema ABDOMEN:abdomen soft, non-tender and normal bowel sounds Musculoskeletal:no cyanosis of digits and no clubbing  NEURO: alert & oriented x 3 with fluent speech, no focal motor/sensory deficits  LABORATORY DATA:  I have reviewed the data as listed   Chemistry      Component Value Date/Time   NA 142 10/15/2014 1002   NA 138 04/12/2013 0715   K 3.4* 10/15/2014 1002   K 3.3* 04/12/2013 0715   CL 104 04/12/2013 0715   CO2 26  10/15/2014 1002   CO2 25 04/12/2013 0715   BUN 12.1 10/15/2014 1002   BUN 8 04/12/2013 0715   CREATININE 0.9 10/15/2014 1002   CREATININE 0.69 04/12/2013 0715      Component Value Date/Time   CALCIUM 9.1 10/15/2014 1002   CALCIUM 8.8 04/12/2013 0715   ALKPHOS 88 10/15/2014 1002   ALKPHOS 112 04/04/2013 0927   AST 22 10/15/2014 1002   AST 30 04/04/2013 0927   ALT 20 10/15/2014 1002   ALT 31 04/04/2013 0927   BILITOT 0.53 10/15/2014 1002   BILITOT 0.5 04/04/2013 0927       Lab Results  Component Value Date   WBC 1.6* 10/15/2014   HGB 10.8* 10/15/2014   HCT 34.4* 10/15/2014   MCV 83.8 10/15/2014   PLT 199 10/15/2014   NEUTROABS 0.9* 10/15/2014   ASSESSMENT & PLAN:  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 Patient is currently on Ibrance, Faslodex, Delton See that started 06/15/14.  Patient experienced the following toxicities of treatment: 1. Severe recurrent neutropenia: After one week of therapy, ANC dropped down to 0.8 and we had to with hold Palbociclib and reduced the dosage to 100 mg daily. [place]  400 on 08/13/2014, Palbociclib dose reduced to 75 mg pills today ANC is 900,  Plan to change Ibrance to 75 mg Monday through Friday and be off on weekends 2. Fatigue related to Palbociclib: Slightly improved with a lower dosage of Ibrance 3. UTI: resolved 4. Injection site discomfort related to Faslodex: Encouraged the patient to apply EMLA cream 5. Left hip and low back pain: I encouraged her to do stretching exercises. 6. Bronchitis: No improvement over the past 2 weeks. Prescribed amoxicillin twice a day for 1 week Continuing to monitor her closely for toxicities. 7.  Ibrance  induced anemia  Grade 1  Plan: 1. Palbociclib to 75 mg Mon-Friday and be off on weekends. 2.  Restaging CT scans end of April 2016  Return to  clinic in one month for blood count check.    Orders Placed This Encounter  Procedures  . CBC with Differential    Standing Status: Future     Number of Occurrences:      Standing Expiration Date: 10/15/2015  . Comprehensive metabolic panel (Cmet) - [facility]    Standing Status: Future     Number of Occurrences:      Standing Expiration Date: 10/15/2015   The patient has a good understanding of the overall plan. she agrees with it. She will call with any problems that may develop before her next visit here.   Rulon Eisenmenger, MD

## 2014-10-15 NOTE — Telephone Encounter (Signed)
per pof ot sch pt appt-gave pt copy of sch-sent MW email to sch trmt

## 2014-11-12 ENCOUNTER — Ambulatory Visit (HOSPITAL_BASED_OUTPATIENT_CLINIC_OR_DEPARTMENT_OTHER): Payer: BLUE CROSS/BLUE SHIELD | Admitting: Hematology and Oncology

## 2014-11-12 ENCOUNTER — Ambulatory Visit (HOSPITAL_BASED_OUTPATIENT_CLINIC_OR_DEPARTMENT_OTHER): Payer: BLUE CROSS/BLUE SHIELD

## 2014-11-12 ENCOUNTER — Telehealth: Payer: Self-pay | Admitting: Hematology and Oncology

## 2014-11-12 ENCOUNTER — Other Ambulatory Visit (HOSPITAL_BASED_OUTPATIENT_CLINIC_OR_DEPARTMENT_OTHER): Payer: BLUE CROSS/BLUE SHIELD

## 2014-11-12 VITALS — BP 147/89 | HR 93 | Temp 98.5°F | Resp 18 | Ht 64.0 in | Wt 195.6 lb

## 2014-11-12 DIAGNOSIS — M25552 Pain in left hip: Secondary | ICD-10-CM

## 2014-11-12 DIAGNOSIS — C7951 Secondary malignant neoplasm of bone: Secondary | ICD-10-CM

## 2014-11-12 DIAGNOSIS — C787 Secondary malignant neoplasm of liver and intrahepatic bile duct: Secondary | ICD-10-CM

## 2014-11-12 DIAGNOSIS — Z5111 Encounter for antineoplastic chemotherapy: Secondary | ICD-10-CM

## 2014-11-12 DIAGNOSIS — M545 Low back pain: Secondary | ICD-10-CM

## 2014-11-12 DIAGNOSIS — C50912 Malignant neoplasm of unspecified site of left female breast: Secondary | ICD-10-CM | POA: Diagnosis not present

## 2014-11-12 DIAGNOSIS — D6481 Anemia due to antineoplastic chemotherapy: Secondary | ICD-10-CM

## 2014-11-12 DIAGNOSIS — D701 Agranulocytosis secondary to cancer chemotherapy: Secondary | ICD-10-CM

## 2014-11-12 DIAGNOSIS — D869 Sarcoidosis, unspecified: Secondary | ICD-10-CM

## 2014-11-12 DIAGNOSIS — C50919 Malignant neoplasm of unspecified site of unspecified female breast: Secondary | ICD-10-CM

## 2014-11-12 LAB — CBC WITH DIFFERENTIAL/PLATELET
BASO%: 0.8 % (ref 0.0–2.0)
Basophils Absolute: 0 10*3/uL (ref 0.0–0.1)
EOS%: 9.1 % — AB (ref 0.0–7.0)
Eosinophils Absolute: 0.2 10*3/uL (ref 0.0–0.5)
HCT: 34.1 % — ABNORMAL LOW (ref 34.8–46.6)
HGB: 11.3 g/dL — ABNORMAL LOW (ref 11.6–15.9)
LYMPH%: 26.6 % (ref 14.0–49.7)
MCH: 27.5 pg (ref 25.1–34.0)
MCHC: 33.1 g/dL (ref 31.5–36.0)
MCV: 83 fL (ref 79.5–101.0)
MONO#: 0.2 10*3/uL (ref 0.1–0.9)
MONO%: 7.9 % (ref 0.0–14.0)
NEUT#: 1.3 10*3/uL — ABNORMAL LOW (ref 1.5–6.5)
NEUT%: 55.6 % (ref 38.4–76.8)
Platelets: 227 10*3/uL (ref 145–400)
RBC: 4.11 10*6/uL (ref 3.70–5.45)
RDW: 16.4 % — AB (ref 11.2–14.5)
WBC: 2.4 10*3/uL — AB (ref 3.9–10.3)
lymph#: 0.6 10*3/uL — ABNORMAL LOW (ref 0.9–3.3)

## 2014-11-12 LAB — COMPREHENSIVE METABOLIC PANEL (CC13)
ALK PHOS: 97 U/L (ref 40–150)
ALT: 28 U/L (ref 0–55)
AST: 27 U/L (ref 5–34)
Albumin: 3.3 g/dL — ABNORMAL LOW (ref 3.5–5.0)
Anion Gap: 9 mEq/L (ref 3–11)
BILIRUBIN TOTAL: 0.58 mg/dL (ref 0.20–1.20)
BUN: 11.2 mg/dL (ref 7.0–26.0)
CALCIUM: 9.1 mg/dL (ref 8.4–10.4)
CHLORIDE: 107 meq/L (ref 98–109)
CO2: 24 mEq/L (ref 22–29)
CREATININE: 0.8 mg/dL (ref 0.6–1.1)
EGFR: >90 mL/min/{1.73_m2} (ref >90)
Glucose: 137 mg/dl (ref 70–140)
Potassium: 3.5 mEq/L (ref 3.5–5.1)
Sodium: 140 mEq/L (ref 136–145)
Total Protein: 7.7 g/dL (ref 6.4–8.3)

## 2014-11-12 MED ORDER — DENOSUMAB 120 MG/1.7ML ~~LOC~~ SOLN
120.0000 mg | Freq: Once | SUBCUTANEOUS | Status: AC
  Administered 2014-11-12: 120 mg via SUBCUTANEOUS
  Filled 2014-11-12: qty 1.7

## 2014-11-12 MED ORDER — FULVESTRANT 250 MG/5ML IM SOLN
500.0000 mg | INTRAMUSCULAR | Status: DC
  Administered 2014-11-12: 500 mg via INTRAMUSCULAR
  Filled 2014-11-12: qty 10

## 2014-11-12 NOTE — Progress Notes (Signed)
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 She also has suspicious bone lesions but not easily accessible acc.to IR.  Therefore we are giving her the benefit of the doubt and treating her with curative intent. MRI suggests nodal involvement in the axilla.        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 Liver metastases   09/14/2012 Cancer Staging PET scan- multiple hypermetabolic areas left pectoral node the breast mass, left hilar, suprahilar, right hilar, right suprahilar, subcarinal lymph node, liver, right iliac, bilateral inguinal nodes, sternum lytic lesion    09/19/2012 Procedure Liver biopsy- Sarcoidosis   09/26/2012 - 01/10/2013 Chemotherapy AC x 4 followed by weekly Paclitaxel x 12. Marker response supraclavicular and mediastinal nodes decreasing the breast mass and liver lesions resolution of sternal activity   04/10/2013 Surgery Bilateral mastectomies, left decidual IDC grade 2 x 0.7 cm with high-grade DCIS LV I 8/11 lymph nodes positive with extracapsular extension ER 99% PR 40%, HER-2 negative. R: No cancer   04/12/2013 Procedure Genetic testing did not reveal BRCA mutations   05/22/2013 - 06/23/2013 Radiation Therapy Adjuvant radiation therapy with Xeloda   06/23/2013 -  Anti-estrogen oral therapy Tamoxifen  20 mg daily with Zoladex every 3 months   05/28/2014 Relapse/Recurrence Bony metastatic disease: Sternum biopsy metastatic carcinoma, HER-2 negative ratio 0.74   06/15/2014 -  Anti-estrogen oral therapy Ibrance, faslodex, xgeva    CHIEF COMPLIANT: Follow-up Ibrance with Faslodex  INTERVAL HISTORY: Brenda Avery is a 44 year old lady with above-mentioned history metastatic breast cancer with bone metastatic disease. She also has sarcoidosis which confuses her entire CT scan picture. She is here today for a one-month follow-up to get her injections. She denies any new complaints or concerns. She feels great and has been eating well and his unfortunately gaining too much weight. Does not have any fevers or chills. Her husband complains that she does not exercise enough.  REVIEW OF SYSTEMS:   Constitutional: Denies fevers, chills or abnormal weight loss Eyes: Denies blurriness of vision Ears, nose, mouth, throat, and face: Denies mucositis or sore throat Respiratory: Denies cough, dyspnea or wheezes Cardiovascular: Denies palpitation, chest discomfort or lower extremity swelling Gastrointestinal:  Denies nausea, heartburn or change in bowel habits Skin: Denies abnormal skin rashes Lymphatics: Denies new lymphadenopathy or easy bruising Neurological:Denies numbness, tingling or new weaknesses Behavioral/Psych: Mood is stable, no new changes  Breast:  denies any pain or lumps or nodules in either breasts All other systems were reviewed with the patient and are negative.  I have reviewed the past medical history, past surgical history, social history and family history with the patient and they are unchanged from previous note.  ALLERGIES:  is allergic to metronidazole and sulfonamide derivatives.  MEDICATIONS:  Current Outpatient Prescriptions  Medication Sig Dispense Refill  . fluticasone (FLONASE) 50 MCG/ACT nasal spray Place 1-2 sprays into both nostrils once as needed for allergies or  rhinitis.    Marland Kitchen lidocaine-prilocaine (EMLA) cream Apply 1 application topically as needed. 30 g 0  . ondansetron (ZOFRAN) 8 MG tablet Take 1 tablet (8 mg total) by mouth every 8 (eight) hours as needed for nausea. 30 tablet 3  . palbociclib (IBRANCE) 75 MG capsule Take 1 capsule (75 mg total) by mouth daily with breakfast. Take whole with food. 21 capsule 1  . potassium chloride SA (K-DUR,KLOR-CON) 20 MEQ tablet Take 1 tablet (20 mEq total) by mouth 2 (two) times daily. 60 tablet 1   No current facility-administered medications for this visit.   Facility-Administered Medications Ordered in Other Visits  Medication Dose Route Frequency Provider Last Rate Last Dose  . fulvestrant (FASLODEX) injection 500 mg  500 mg Intramuscular Q30 days Nicholas Lose, MD   500 mg at 11/12/14 1029    PHYSICAL EXAMINATION: ECOG PERFORMANCE STATUS: 1 - Symptomatic but completely ambulatory  Filed Vitals:   11/12/14 0854  BP: 147/89  Pulse: 93  Temp: 98.5 F (36.9 C)  Resp: 18   Filed Weights   11/12/14 0854  Weight: 195 lb 9.6 oz (88.724 kg)    GENERAL:alert, no distress and comfortable SKIN: skin color, texture, turgor are normal, no rashes or significant lesions EYES: normal, Conjunctiva are pink and non-injected, sclera clear OROPHARYNX:no exudate, no erythema and lips, buccal mucosa, and tongue normal  NECK: supple, thyroid normal size, non-tender, without nodularity LYMPH:  no palpable lymphadenopathy in the cervical, axillary or inguinal LUNGS: clear to auscultation and percussion with normal breathing effort HEART: regular rate & rhythm and no murmurs and no lower extremity edema ABDOMEN:abdomen soft, non-tender and normal bowel sounds Musculoskeletal:no cyanosis of digits and no clubbing  NEURO: alert & oriented x 3 with fluent speech, no focal motor/sensory deficits  LABORATORY DATA:  I have reviewed the data as listed   Chemistry      Component Value Date/Time   NA 140 11/12/2014  0845   NA 138 04/12/2013 0715   K 3.5 11/12/2014 0845   K 3.3* 04/12/2013 0715   CL 104 04/12/2013 0715   CO2 24 11/12/2014 0845   CO2 25 04/12/2013 0715   BUN 11.2 11/12/2014 0845   BUN 8 04/12/2013 0715   CREATININE 0.8 11/12/2014 0845   CREATININE 0.69 04/12/2013 0715      Component Value Date/Time   CALCIUM 9.1 11/12/2014 0845   CALCIUM 8.8 04/12/2013 0715   ALKPHOS 97 11/12/2014 0845   ALKPHOS 112 04/04/2013 0927   AST 27 11/12/2014 0845   AST 30 04/04/2013 0927   ALT 28 11/12/2014 0845   ALT 31 04/04/2013 0927   BILITOT 0.58 11/12/2014 0845   BILITOT 0.5 04/04/2013 0927       Lab Results  Component Value Date   WBC 2.4* 11/12/2014   HGB 11.3* 11/12/2014   HCT 34.1* 11/12/2014   MCV 83.0 11/12/2014   PLT 227 11/12/2014   NEUTROABS 1.3* 11/12/2014   ASSESSMENT & PLAN:  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 Patient is currently on Ibrance, Faslodex, Delton See that started 06/15/14.  Patient experienced the following toxicities of treatment: 1. Severe recurrent neutropenia: After one week of therapy, ANC dropped down to  0.8 and we had to with hold Palbociclib and reduced the dosage to 100 mg daily. ANC 400 on 08/13/2014, Palbociclib dose reduced to 75 mg pills today ANC is 900, on 10/15/2014, Ibrance changed to 75 mg Monday through Friday and be off on weekends 2. Fatigue related to Palbociclib: Slightly improved with a lower dosage of Ibrance 3. UTI: resolved 4. Injection site discomfort related to Faslodex: Encouraged the patient to apply EMLA cream 5. Left hip and low back pain: I encouraged her to do stretching exercises. 6. Bronchitis: Improved after antibiotics 7. Ibranceinduced anemia Grade 1 Continuing to monitor her closely for toxicities.  Plan: 1. Palbociclib to 75 mg Mon-Friday and be off on  weekends. 2. Restaging CT scans end of April 2016 Return to clinic in one month for blood count check and to review the CT scans.    Orders Placed This Encounter  Procedures  . CT Abdomen Pelvis W Contrast    Standing Status: Future     Number of Occurrences:      Standing Expiration Date: 11/12/2015    Order Specific Question:  Reason for Exam (SYMPTOM  OR DIAGNOSIS REQUIRED)    Answer:  Metastatic breast cancer restaging    Order Specific Question:  Is the patient pregnant?    Answer:  No    Order Specific Question:  Preferred imaging location?    Answer:  [facility]  . CT Chest W Contrast    Standing Status: Future     Number of Occurrences:      Standing Expiration Date: 11/12/2015    Order Specific Question:  Reason for Exam (SYMPTOM  OR DIAGNOSIS REQUIRED)    Answer:  Metastatic breast cancer restaging    Order Specific Question:  Is the patient pregnant?    Answer:  No    Order Specific Question:  Preferred imaging location?    Answer:  [facility]    Standing Status: Future     Number of Occurrences:      Standing Expiration Date: 11/12/2015    Order Specific Question:  Reason for Exam (SYMPTOM  OR DIAGNOSIS REQUIRED)    Answer:  Metastatic Breast Cancer restaging    Order Specific Question:  Is the patient pregnant?    Answer:  No    Order Specific Question:  Preferred imaging location?    Answer:  [facility]   The patient has a good understanding of the overall plan. she agrees with it. She will call with any problems that may develop before her next visit here.   Rulon Eisenmenger, MD

## 2014-11-12 NOTE — Telephone Encounter (Signed)
appointments made and avs printed for patient,contrast given

## 2014-11-12 NOTE — Assessment & Plan Note (Signed)
Metastatic breast cancer with bone metastases, lung, liver metastases, spleen lesions, and pleural irregularity ER 100%, PR 3%, HER-2 negative. Patient is currently on Ibrance, Faslodex, Delton See that started 06/15/14.  Patient experienced the following toxicities of treatment: 1. Severe recurrent neutropenia: After one week of therapy, ANC dropped down to 0.8 and we had to with hold Palbociclib and reduced the dosage to 100 mg daily. ANC 400 on 08/13/2014, Palbociclib dose reduced to 75 mg pills today ANC is 900, on 10/15/2014, Ibrance changed to 75 mg Monday through Friday and be off on weekends 2. Fatigue related to Palbociclib: Slightly improved with a lower dosage of Ibrance 3. UTI: resolved 4. Injection site discomfort related to Faslodex: Encouraged the patient to apply EMLA cream 5. Left hip and low back pain: I encouraged her to do stretching exercises. 6. Bronchitis: Improved after antibiotics 7. Ibranceinduced anemia Grade 1 Continuing to monitor her closely for toxicities.  Plan: 1. Palbociclib to 75 mg Mon-Friday and be off on weekends. 2. Restaging CT scans end of April 2016 Return to clinic in one month for blood count check and to review the CT scans.

## 2014-11-20 ENCOUNTER — Other Ambulatory Visit: Payer: Self-pay

## 2014-11-20 ENCOUNTER — Telehealth: Payer: Self-pay | Admitting: *Deleted

## 2014-11-20 DIAGNOSIS — C50912 Malignant neoplasm of unspecified site of left female breast: Secondary | ICD-10-CM

## 2014-11-20 MED ORDER — PALBOCICLIB 75 MG PO CAPS: 75.0000 mg | ORAL_CAPSULE | Freq: Every day | ORAL | Status: DC

## 2014-11-20 NOTE — Telephone Encounter (Signed)
Refill request for Ibrance received from [facility]. Taken to Dr Lindi Adie

## 2014-11-21 NOTE — Telephone Encounter (Signed)
Brenda Avery with [facility] called requesting cycle for Ibrance ordered for this patient.  Instructs are to take for twenty one days, rest for seven and repeat.

## 2014-12-03 ENCOUNTER — Encounter (HOSPITAL_COMMUNITY)
Admission: RE | Admit: 2014-12-03 | Discharge: 2014-12-03 | Disposition: A | Discharge status: Home / Self Care | Payer: BLUE CROSS/BLUE SHIELD | Source: Ambulatory Visit | Attending: Hematology and Oncology | Admitting: Hematology and Oncology

## 2014-12-03 ENCOUNTER — Ambulatory Visit (HOSPITAL_COMMUNITY)
Admission: RE | Admit: 2014-12-03 | Discharge: 2014-12-03 | Disposition: A | Discharge status: Home / Self Care | Payer: BLUE CROSS/BLUE SHIELD | Source: Ambulatory Visit | Attending: Hematology and Oncology | Admitting: Hematology and Oncology

## 2014-12-03 ENCOUNTER — Encounter (HOSPITAL_COMMUNITY): Payer: Self-pay

## 2014-12-03 DIAGNOSIS — Z79899 Other long term (current) drug therapy: Secondary | ICD-10-CM | POA: Insufficient documentation

## 2014-12-03 DIAGNOSIS — C50912 Malignant neoplasm of unspecified site of left female breast: Secondary | ICD-10-CM | POA: Diagnosis present

## 2014-12-03 DIAGNOSIS — C799 Secondary malignant neoplasm of unspecified site: Secondary | ICD-10-CM | POA: Insufficient documentation

## 2014-12-03 DIAGNOSIS — Z923 Personal history of irradiation: Secondary | ICD-10-CM | POA: Diagnosis not present

## 2014-12-03 DIAGNOSIS — Z9013 Acquired absence of bilateral breasts and nipples: Secondary | ICD-10-CM | POA: Insufficient documentation

## 2014-12-03 MED ORDER — TECHNETIUM TC 99M MEDRONATE IV KIT
26.0000 | PACK | Freq: Once | INTRAVENOUS | Status: AC | PRN
  Administered 2014-12-03: 26 via INTRAVENOUS

## 2014-12-03 MED ORDER — IOHEXOL 300 MG/ML  SOLN
100.0000 mL | Freq: Once | INTRAMUSCULAR | Status: AC | PRN
  Administered 2014-12-03: 100 mL via INTRAVENOUS

## 2014-12-07 ENCOUNTER — Other Ambulatory Visit: Payer: Self-pay

## 2014-12-07 DIAGNOSIS — C50912 Malignant neoplasm of unspecified site of left female breast: Secondary | ICD-10-CM

## 2014-12-09 NOTE — Assessment & Plan Note (Signed)
Metastatic breast cancer with bone metastases, lung, liver metastases, spleen lesions, and pleural irregularity ER 100%, PR 3%, HER-2 negative. Patient is currently on Ibrance, Faslodex, Delton See that started 06/15/14.  Patient experienced the following toxicities of treatment: 1. Severe recurrent neutropenia: After one week of therapy, ANC dropped down to 0.8 and we had to with hold Palbociclib and reduced the dosage to 100 mg daily. ANC 400 on 08/13/2014, Palbociclib dose reduced to 75 mg pills today ANC is 900, on 10/15/2014, Ibrance changed to 75 mg Monday through Friday and be off on weekends 2. Fatigue related to Palbociclib: Slightly improved with a lower dosage of Ibrance 3. UTI: resolved 4. Injection site discomfort related to Faslodex: Encouraged the patient to apply EMLA cream 5. Left hip and low back pain: I encouraged her to do stretching exercises. 6. Bronchitis: Improved after antibiotics 7. Ibranceinduced anemia Grade 1 Continuing to monitor her closely for toxicities.  CT scans and Bone scan: Non PET avid MediastimalLN, Multifocal Liver lesions, ? Sarcoidosis in spleen, Progression of abdominal/ retroperitoneal LN, Sternum sclerotic lesion, Pelvic mets.  Plan: 1. Scan shows progression but ai suspect she may have underlying sarcoidosis. 2. Will try to get a LN biopsy to evaluate. 3. Palbociclib to 75 mg Mon-Friday and be off on weekends.

## 2014-12-10 ENCOUNTER — Encounter: Payer: Self-pay | Admitting: Hematology and Oncology

## 2014-12-10 ENCOUNTER — Other Ambulatory Visit (HOSPITAL_BASED_OUTPATIENT_CLINIC_OR_DEPARTMENT_OTHER): Payer: BLUE CROSS/BLUE SHIELD

## 2014-12-10 ENCOUNTER — Telehealth: Payer: Self-pay | Admitting: Hematology and Oncology

## 2014-12-10 ENCOUNTER — Ambulatory Visit (HOSPITAL_BASED_OUTPATIENT_CLINIC_OR_DEPARTMENT_OTHER): Payer: BLUE CROSS/BLUE SHIELD

## 2014-12-10 ENCOUNTER — Ambulatory Visit (HOSPITAL_BASED_OUTPATIENT_CLINIC_OR_DEPARTMENT_OTHER): Payer: BLUE CROSS/BLUE SHIELD | Admitting: Hematology and Oncology

## 2014-12-10 VITALS — BP 130/81 | HR 105 | Temp 98.1°F | Resp 18 | Ht 64.0 in | Wt 188.8 lb

## 2014-12-10 DIAGNOSIS — C50919 Malignant neoplasm of unspecified site of unspecified female breast: Secondary | ICD-10-CM

## 2014-12-10 DIAGNOSIS — C787 Secondary malignant neoplasm of liver and intrahepatic bile duct: Secondary | ICD-10-CM | POA: Diagnosis not present

## 2014-12-10 DIAGNOSIS — Z5111 Encounter for antineoplastic chemotherapy: Secondary | ICD-10-CM | POA: Diagnosis not present

## 2014-12-10 DIAGNOSIS — C7951 Secondary malignant neoplasm of bone: Secondary | ICD-10-CM

## 2014-12-10 DIAGNOSIS — C50912 Malignant neoplasm of unspecified site of left female breast: Secondary | ICD-10-CM

## 2014-12-10 DIAGNOSIS — C7800 Secondary malignant neoplasm of unspecified lung: Secondary | ICD-10-CM

## 2014-12-10 LAB — COMPREHENSIVE METABOLIC PANEL (CC13)
ALT: 36 U/L (ref 0–55)
AST: 42 U/L — ABNORMAL HIGH (ref 5–34)
Albumin: 3.4 g/dL — ABNORMAL LOW (ref 3.5–5.0)
Alkaline Phosphatase: 97 U/L (ref 40–150)
Anion Gap: 14 mEq/L — ABNORMAL HIGH (ref 3–11)
BUN: 7.4 mg/dL (ref 7.0–26.0)
CHLORIDE: 106 meq/L (ref 98–109)
CO2: 22 mEq/L (ref 22–29)
CREATININE: 0.8 mg/dL (ref 0.6–1.1)
Calcium: 8.5 mg/dL (ref 8.4–10.4)
EGFR: >90 mL/min/{1.73_m2} (ref >90)
Glucose: 153 mg/dl — ABNORMAL HIGH (ref 70–140)
Potassium: 3.6 mEq/L (ref 3.5–5.1)
Sodium: 141 mEq/L (ref 136–145)
Total Bilirubin: 0.51 mg/dL (ref 0.20–1.20)
Total Protein: 7.6 g/dL (ref 6.4–8.3)

## 2014-12-10 LAB — CBC WITH DIFFERENTIAL/PLATELET
BASO%: 1.1 % (ref 0.0–2.0)
Basophils Absolute: 0 10*3/uL (ref 0.0–0.1)
EOS ABS: 0.2 10*3/uL (ref 0.0–0.5)
EOS%: 6.4 % (ref 0.0–7.0)
HCT: 35 % (ref 34.8–46.6)
HEMOGLOBIN: 11.6 g/dL (ref 11.6–15.9)
LYMPH%: 23.6 % (ref 14.0–49.7)
MCH: 27.6 pg (ref 25.1–34.0)
MCHC: 33.1 g/dL (ref 31.5–36.0)
MCV: 83.1 fL (ref 79.5–101.0)
MONO#: 0.5 10*3/uL (ref 0.1–0.9)
MONO%: 16.4 % — ABNORMAL HIGH (ref 0.0–14.0)
NEUT%: 52.5 % (ref 38.4–76.8)
NEUTROS ABS: 1.5 10*3/uL (ref 1.5–6.5)
PLATELETS: 193 10*3/uL (ref 145–400)
RBC: 4.21 10*6/uL (ref 3.70–5.45)
RDW: 17.1 % — ABNORMAL HIGH (ref 11.2–14.5)
WBC: 2.8 10*3/uL — AB (ref 3.9–10.3)
lymph#: 0.7 10*3/uL — ABNORMAL LOW (ref 0.9–3.3)

## 2014-12-10 MED ORDER — DENOSUMAB 120 MG/1.7ML ~~LOC~~ SOLN
120.0000 mg | Freq: Once | SUBCUTANEOUS | Status: AC
  Administered 2014-12-10: 120 mg via SUBCUTANEOUS
  Filled 2014-12-10: qty 1.7

## 2014-12-10 MED ORDER — FULVESTRANT 250 MG/5ML IM SOLN
500.0000 mg | INTRAMUSCULAR | Status: DC
  Administered 2014-12-10: 500 mg via INTRAMUSCULAR
  Filled 2014-12-10: qty 10

## 2014-12-10 NOTE — Progress Notes (Signed)
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 She also has suspicious bone lesions but not easily accessible acc.to IR.  Therefore we are giving her the benefit of the doubt and treating her with curative intent. MRI suggests nodal involvement in the axilla.        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 Liver metastases   09/14/2012 Cancer Staging PET scan- multiple hypermetabolic areas left pectoral node the breast mass, left hilar, suprahilar, right hilar, right suprahilar, subcarinal lymph node, liver, right iliac, bilateral inguinal nodes, sternum lytic lesion    09/19/2012 Procedure Liver biopsy- Sarcoidosis   09/26/2012 - 01/10/2013 Chemotherapy AC x 4 followed by weekly Paclitaxel x 12. Marker response supraclavicular and mediastinal nodes decreasing the breast mass and liver lesions resolution of sternal activity   04/10/2013 Surgery Bilateral mastectomies, left decidual IDC grade 2 x 0.7 cm with high-grade DCIS LV I 8/11 lymph nodes positive with extracapsular extension ER 99% PR 40%, HER-2 negative. R: No cancer   04/12/2013 Procedure Genetic testing did not reveal BRCA mutations   05/22/2013 - 06/23/2013 Radiation Therapy Adjuvant radiation therapy with Xeloda   06/23/2013 -  Anti-estrogen oral therapy Tamoxifen 20 mg daily with Zoladex every 3 months   05/28/2014 Relapse/Recurrence Bony metastatic disease: Sternum biopsy metastatic carcinoma, HER-2 negative ratio 0.74   06/15/2014 -  Anti-estrogen oral therapy Ibrance, faslodex, xgeva    CHIEF COMPLIANT: metastatic breast cancer follow-up to discuss the results of the scans.  INTERVAL HISTORY: Brenda Avery is a 44 year old American lady with above-mentioned history of bone metastatic disease based on his sternal biopsy. She underwent a recent CT chest abdomen and pelvis and bone scan is here today to discuss the result. The scans revealed progression of disease especially in the lymph nodes and liver. Both of which are related to sarcoidosis and unrelated to breast cancer. Her manubrial lesion is stable and no different from before. There were no other new hypermetabolic or active bone lesions anywhere else. Overall she has stable disease. She reports no new concerns or complaints. Does not have any pain anywhere.  REVIEW OF SYSTEMS:   Constitutional: Denies fevers, chills or abnormal weight loss Eyes: Denies blurriness of vision Ears, nose, mouth, throat, and face: Denies mucositis or sore throat Respiratory: Denies cough, dyspnea or wheezes Cardiovascular: Denies palpitation, chest discomfort or lower extremity swelling Gastrointestinal:  Denies nausea, heartburn or change in bowel habits Skin: Denies abnormal skin rashes Lymphatics: Denies new lymphadenopathy or easy bruising Neurological:Denies numbness, tingling or new weaknesses Behavioral/Psych: Mood is stable, no new changes  Breast:  denies any pain or lumps or nodules in either breasts All other systems were reviewed with the patient and  are negative.  I have reviewed the past medical history, past surgical history, social history and family history with the patient and they are unchanged from previous note.  ALLERGIES:  is allergic to  metronidazole and sulfonamide derivatives.  MEDICATIONS:  Current Outpatient Prescriptions  Medication Sig Dispense Refill  . fluticasone (FLONASE) 50 MCG/ACT nasal spray Place 1-2 sprays into both nostrils once as needed for allergies or rhinitis.    Marland Kitchen lidocaine-prilocaine (EMLA) cream Apply 1 application topically as needed. 30 g 0  . ondansetron (ZOFRAN) 8 MG tablet Take 1 tablet (8 mg total) by mouth every 8 (eight) hours as needed for nausea. 30 tablet 3  . palbociclib (IBRANCE) 75 MG capsule Take 1 capsule (75 mg total) by mouth daily with breakfast. Take whole with food. (Patient taking differently: Take 75 mg by mouth daily with breakfast. Take whole with food.) 21 capsule 1  . potassium chloride SA (K-DUR,KLOR-CON) 20 MEQ tablet Take 1 tablet (20 mEq total) by mouth 2 (two) times daily. 60 tablet 1   No current facility-administered medications for this visit.   Facility-Administered Medications Ordered in Other Visits  Medication Dose Route Frequency Provider Last Rate Last Dose  . fulvestrant (FASLODEX) injection 500 mg  500 mg Intramuscular Q30 days Nicholas Lose, MD   500 mg at 12/10/14 1013    PHYSICAL EXAMINATION: ECOG PERFORMANCE STATUS: 0 - Asymptomatic  Filed Vitals:   12/10/14 0907  BP: 130/81  Pulse: 105  Temp: 98.1 F (36.7 C)  Resp: 18   Filed Weights   12/10/14 0858 12/10/14 0907  Weight: 188 lb 12.8 oz (85.639 kg) 188 lb 12.8 oz (85.639 kg)    GENERAL:alert, no distress and comfortable SKIN: skin color, texture, turgor are normal, no rashes or significant lesions EYES: normal, Conjunctiva are pink and non-injected, sclera clear OROPHARYNX:no exudate, no erythema and lips, buccal mucosa, and tongue normal  NECK: supple, thyroid normal size, non-tender, without nodularity LYMPH:  no palpable lymphadenopathy in the cervical, axillary or inguinal LUNGS: clear to auscultation and percussion with normal breathing effort HEART: regular rate & rhythm and no  murmurs and no lower extremity edema ABDOMEN:abdomen soft, non-tender and normal bowel sounds Musculoskeletal:no cyanosis of digits and no clubbing  NEURO: alert & oriented x 3 with fluent speech, no focal motor/sensory deficits  LABORATORY DATA:  I have reviewed the data as listed   Chemistry      Component Value Date/Time   NA 141 12/10/2014 0847   NA 138 04/12/2013 0715   K 3.6 12/10/2014 0847   K 3.3* 04/12/2013 0715   CL 104 04/12/2013 0715   CO2 22 12/10/2014 0847   CO2 25 04/12/2013 0715   BUN 7.4 12/10/2014 0847   BUN 8 04/12/2013 0715   CREATININE 0.8 12/10/2014 0847   CREATININE 0.69 04/12/2013 0715      Component Value Date/Time   CALCIUM 8.5 12/10/2014 0847   CALCIUM 8.8 04/12/2013 0715   ALKPHOS 97 12/10/2014 0847   ALKPHOS 112 04/04/2013 0927   AST 42* 12/10/2014 0847   AST 30 04/04/2013 0927   ALT 36 12/10/2014 0847   ALT 31 04/04/2013 0927   BILITOT 0.51 12/10/2014 0847   BILITOT 0.5 04/04/2013 0927       Lab Results  Component Value Date   WBC 2.8* 12/10/2014   HGB 11.6 12/10/2014   HCT 35.0 12/10/2014   MCV 83.1 12/10/2014   PLT 193 12/10/2014   NEUTROABS 1.5 12/10/2014    ASSESSMENT & PLAN:  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 Patient is currently on Ibrance, Faslodex, [place] See that started 06/15/14.  Patient experienced the following toxicities of treatment: 1. Severe recurrent neutropenia: After one week of therapy, ANC dropped down to 0.8 and we had to with hold Palbociclib and reduced the dosage to 100 mg daily. ANC 400 on 08/13/2014, Palbociclib dose reduced to 75 mg pills today ANC is 900, on 10/15/2014, Ibrance changed to 75 mg Monday through Friday and be off on weekends 2. Fatigue related to Palbociclib: Slightly improved with a lower dosage of Ibrance 3. UTI: resolved 4.  Injection site discomfort related to Faslodex: Encouraged the patient to apply EMLA cream 5. Left hip and low back pain: I encouraged her to do stretching exercises. 6. Bronchitis: Improved after antibiotics 7. Ibranceinduced anemia Grade 1 Continuing to monitor her closely for toxicities.  CT scans and Bone scan: Non PET avid MediastimalLN, Multifocal Liver lesions, ? Sarcoidosis in spleen, Progression of abdominal/ retroperitoneal LN, Sternum sclerotic lesion, Pelvic mets.  Plan: 1. Scan shows progression of lymphadenopathy and liver lesions all of which are related to sarcoidosis. Previously her liver has been biopsied in 2014 and it had proven to be sarcoidosis. Her bone lesion is primarily in the manubrium. It still shows uptake on bone scan. Overall I believe her disease is stable.  2. Continue with Palbociclib to 75 mg Mon-Friday and be off on weekends. 3. Referral to rheumatology for evaluation and treatment of sarcoidosis.  I reviewed her blood work from today which showed an [place] of 1500. I recommended that she continue with the same treatment of Faslodex and Ibrance and come back to see [place] in one month for follow-up with labs.   Orders Placed This Encounter  Procedures  . CBC with Differential    Standing Status: Future     Number of Occurrences:      Standing Expiration Date: 12/10/2015  . Comprehensive metabolic panel (Cmet) - [facility]    Standing Status: Future     Number of Occurrences:      Standing Expiration Date: 12/10/2015   The patient has a good understanding of the overall plan. she agrees with it. She will call with any problems that may develop before her next visit here.   Rulon Eisenmenger, MD

## 2014-12-10 NOTE — Progress Notes (Signed)
I returned call to Santiago Glad at [facility]  [address] in reference to [place]. I had to leave her a message.

## 2014-12-10 NOTE — Progress Notes (Signed)
I called to let the patient know [facility] is trying to contact her about her ibrance. I told her she needs to call them today.

## 2014-12-10 NOTE — Telephone Encounter (Signed)
per pof to sch pt appt-gave tp copy of sch-pt stated will make rhuemotoglist appt

## 2015-01-01 ENCOUNTER — Emergency Department (HOSPITAL_COMMUNITY)
Admission: EM | Admit: 2015-01-01 | Discharge: 2015-01-01 | Disposition: A | Discharge status: Home / Self Care | Payer: BLUE CROSS/BLUE SHIELD | Attending: Emergency Medicine | Admitting: Emergency Medicine

## 2015-01-01 ENCOUNTER — Encounter (HOSPITAL_COMMUNITY): Payer: Self-pay | Admitting: Emergency Medicine

## 2015-01-01 DIAGNOSIS — Z9851 Tubal ligation status: Secondary | ICD-10-CM | POA: Insufficient documentation

## 2015-01-01 DIAGNOSIS — R112 Nausea with vomiting, unspecified: Secondary | ICD-10-CM | POA: Diagnosis present

## 2015-01-01 DIAGNOSIS — Z923 Personal history of irradiation: Secondary | ICD-10-CM | POA: Insufficient documentation

## 2015-01-01 DIAGNOSIS — A084 Viral intestinal infection, unspecified: Secondary | ICD-10-CM

## 2015-01-01 DIAGNOSIS — Z9221 Personal history of antineoplastic chemotherapy: Secondary | ICD-10-CM | POA: Insufficient documentation

## 2015-01-01 DIAGNOSIS — Z79899 Other long term (current) drug therapy: Secondary | ICD-10-CM | POA: Insufficient documentation

## 2015-01-01 DIAGNOSIS — Z853 Personal history of malignant neoplasm of breast: Secondary | ICD-10-CM | POA: Diagnosis not present

## 2015-01-01 DIAGNOSIS — E119 Type 2 diabetes mellitus without complications: Secondary | ICD-10-CM | POA: Insufficient documentation

## 2015-01-01 LAB — COMPREHENSIVE METABOLIC PANEL
ALT: 81 U/L — AB (ref 14–54)
ANION GAP: 13 (ref 5–15)
AST: 105 U/L — ABNORMAL HIGH (ref 15–41)
Albumin: 3.6 g/dL (ref 3.5–5.0)
Alkaline Phosphatase: 196 U/L — ABNORMAL HIGH (ref 38–126)
BUN: 11 mg/dL (ref 6–20)
CO2: 25 mmol/L (ref 22–32)
Calcium: 8.8 mg/dL — ABNORMAL LOW (ref 8.9–10.3)
Chloride: 103 mmol/L (ref 101–111)
Creatinine, Ser: 0.9 mg/dL (ref 0.44–1.00)
GLUCOSE: 100 mg/dL — AB (ref 65–99)
POTASSIUM: 3.3 mmol/L — AB (ref 3.5–5.1)
Sodium: 141 mmol/L (ref 135–145)
TOTAL PROTEIN: 8.3 g/dL — AB (ref 6.5–8.1)
Total Bilirubin: 1.1 mg/dL (ref 0.3–1.2)

## 2015-01-01 LAB — CBC WITH DIFFERENTIAL/PLATELET
Basophils Absolute: 0 10*3/uL (ref 0.0–0.1)
Basophils Relative: 1 % (ref 0–1)
EOS ABS: 0.2 10*3/uL (ref 0.0–0.7)
Eosinophils Relative: 5 % (ref 0–5)
HCT: 37.1 % (ref 36.0–46.0)
Hemoglobin: 12 g/dL (ref 12.0–15.0)
LYMPHS ABS: 0.8 10*3/uL (ref 0.7–4.0)
LYMPHS PCT: 20 % (ref 12–46)
MCH: 26.5 pg (ref 26.0–34.0)
MCHC: 32.3 g/dL (ref 30.0–36.0)
MCV: 81.9 fL (ref 78.0–100.0)
Monocytes Absolute: 0.4 10*3/uL (ref 0.1–1.0)
Monocytes Relative: 11 % (ref 3–12)
Neutro Abs: 2.6 10*3/uL (ref 1.7–7.7)
Neutrophils Relative %: 63 % (ref 43–77)
PLATELETS: 248 10*3/uL (ref 150–400)
RBC: 4.53 MIL/uL (ref 3.87–5.11)
RDW: 16 % — ABNORMAL HIGH (ref 11.5–15.5)
WBC: 4 10*3/uL (ref 4.0–10.5)

## 2015-01-01 MED ORDER — ONDANSETRON HCL 4 MG/2ML IJ SOLN
4.0000 mg | Freq: Once | INTRAMUSCULAR | Status: AC
  Administered 2015-01-01: 4 mg via INTRAVENOUS
  Filled 2015-01-01: qty 2

## 2015-01-01 MED ORDER — ONDANSETRON HCL 4 MG PO TABS: 4.0000 mg | ORAL_TABLET | Freq: Four times a day (QID) | ORAL | Status: DC

## 2015-01-01 MED ORDER — SODIUM CHLORIDE 0.9 % IV BOLUS (SEPSIS)
1000.0000 mL | Freq: Once | INTRAVENOUS | Status: AC
  Administered 2015-01-01: 1000 mL via INTRAVENOUS

## 2015-01-01 NOTE — ED Provider Notes (Signed)
CSN: [id number]     Arrival date & time 01/01/15  1643 History   First MD Initiated Contact with Patient 01/01/15 1726     Chief Complaint  Patient presents with  . Emesis  . Diarrhea  . Nausea    HPI   44 year old female with history of left breast metastatic carcinoma to multiple sites presents today with for nausea vomiting and diarrhea 4 days. Patient reports that she is currently receiving chemotherapy proximal, with once daily medication, last therapy session approximately 1 month ago. Patient was recently seen by an oncologist at the end of April 2016 and is determined that her cancer was stable at that time. Patient reports that she's been feeling well up until Saturday when she started to develop nausea vomiting and diarrhea, she denies significant abdominal pain, chest pain, headaches, fever, changes in urine or bowel characteristics or frequency, lower extremity swelling or edema. 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 Patient reports that she has difficulty handling solid foods, with Vomiting. She Is able to hold down fluids but this causes nausea for her. Patient has no other complaints in addition to the non bloody nausea vomiting diarrhea.   Past Medical History  Diagnosis Date  . Hyperlipidemia     diet therapy per patient request  . 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 Hematuria 2000/2001    evaluted in [place] with negative results  . Breast CA     left  . S/P radiation therapy  05/10/2013-06/23/2013    Left chest wall / 50.4 Gray @ 1.8 Gray per fraction x 28 fractions/ Left Supraclavicular fossa / 59 Gray @1 .8 Gray per fraction x 25 fractions/ Left PAB / 45 Gy at 1.8 Gray per fraction x 25 fractions/Left scar / 10 Gray at [employer] per fraction x 5 fractions     . Status post chemotherapy      Radiation  Therapy Radiosensitizing Xeloda  . Status post chemotherapy  09/26/2012 - 11/07/2012      Neoadjuvant chemotherapy consisting of 4 cycles of a.c. From 09/26/2012 through 11/07/2012 she then received 12 weeks of paclitaxel.  . Use of tamoxifen (Nolvadex) started 06/26/13  . Type II diabetes mellitus     diet therapy    Past Surgical History  Procedure Laterality Date  . Rectocele repair  2004  . Cystoscopy  2001  . Nevus excision  2004    facial  . Portacath placement  09/20/2012    Procedure: INSERTION PORT-A-CATH;  Surgeon: Adin Hector, MD;  Location: [facility];  Service: General;  Laterality: N/A;  port a cath insertion with flouro and ultrasound   . Mastectomy Right 04/10/2013  . Mastectomy modified radical Left 04/10/2013  . Axillary sentinel node biopsy Right 04/10/2013  . Liver biopsy    . Breast biopsy Left 2014  . Tubal ligation  2004  . Mastectomy modified radical Left 04/10/2013    Procedure: LEFT MODIFIED RADICAL MASTECTOMY ;  Surgeon: Adin Hector, MD;  Location: [facility];  Service: General;  Laterality: Left;  . Simple mastectomy with axillary sentinel node biopsy Right 04/10/2013    Procedure: RIGHT TOTAL  MASTECTOMY WITH AXILLARY SENTINEL NODE BIOPSY;  Surgeon: Adin Hector, MD;  Location: [facility];  Service: General;  Laterality: Right;  Methylene Blue Injection  Family History  Problem Relation Age of Onset  . Hypertension Mother   . Diabetes Mother     MI  . Heart disease Mother   . Hypertension Sister     x4 only one HTN and one thats DI  . Hypertension Brother     x4 only 1 htn & dm  . Heart disease Father   . Breast cancer Maternal Aunt     diagnosed in her 87s  . Cancer Maternal Aunt     unknown  . Breast cancer Paternal Aunt     diagnosed in her 45s  . Cervical cancer Sister 63  . Breast cancer Maternal Aunt     diagnosed in her 97s  . Breast cancer Cousin     3 maternal cousins - 2 in their 47s, 1 in her 26s   History  Substance Use Topics  .  Smoking status: Never Smoker   . Smokeless tobacco: Never Used  . Alcohol Use: No   OB History    No data available     Review of Systems  All other systems reviewed and are negative.   Allergies  Metronidazole and Sulfonamide derivatives  Home Medications   Prior to Admission medications   Medication Sig Start Date End Date Taking? Authorizing Provider  fluticasone (FLONASE) 50 MCG/ACT nasal spray Place 1-2 sprays into both nostrils once as needed for allergies or rhinitis.    Historical Provider, MD  lidocaine-prilocaine (EMLA) cream Apply 1 application topically as needed. 06/22/14   Laurie Panda, NP  ondansetron (ZOFRAN) 8 MG tablet Take 1 tablet (8 mg total) by mouth every 8 (eight) hours as needed for nausea. 06/15/14   Nicholas Lose, MD  palbociclib (IBRANCE) 75 MG capsule Take 1 capsule (75 mg total) by mouth daily with breakfast. Take whole with food. Patient taking differently: Take 75 mg by mouth daily with breakfast. Take whole with food. 11/20/14   Nicholas Lose, MD  potassium chloride SA (K-DUR,KLOR-CON) 20 MEQ tablet Take 1 tablet (20 mEq total) by mouth 2 (two) times daily. 07/04/14   Nicholas Lose, MD   BP 148/94 mmHg  Pulse 91  Temp(Src) 98.5 F (36.9 C) (Oral)  Resp 20  SpO2 98% Physical Exam  Constitutional: She is oriented to person, place, and time. She appears well-developed and well-nourished.  HENT:  Head: Normocephalic and atraumatic.  Mouth/Throat: Oropharynx is clear and moist.  Eyes: Pupils are equal, round, and reactive to light.  Neck: Normal range of motion. Neck supple. No JVD present. No tracheal deviation present. No thyromegaly present.  Cardiovascular: Regular rhythm, normal heart sounds and intact distal pulses.  Exam reveals no gallop and no friction rub.   No murmur heard. Pulmonary/Chest: Effort normal and breath sounds normal. No stridor. No respiratory distress. She has no wheezes. She has no rales. She exhibits no tenderness.   Abdominal: Soft. Bowel sounds are normal. She exhibits no distension and no mass. There is no tenderness. There is no rebound and no guarding.  Musculoskeletal: Normal range of motion.  Lymphadenopathy:    She has no cervical adenopathy.  Neurological: She is alert and oriented to person, place, and time. Coordination normal.  Skin: Skin is warm and dry.  Psychiatric: She has a normal mood and affect. Her behavior is normal. Judgment and thought content normal.  Nursing note and vitals reviewed.   ED Course  Procedures (including critical care time) Labs Review Labs Reviewed  CBC WITH DIFFERENTIAL/PLATELET - Abnormal; Notable for the  following:    RDW 16.0 (*)    All other components within normal limits  COMPREHENSIVE METABOLIC PANEL    Imaging Review No results found.   EKG Interpretation None      MDM   Final diagnoses:  Viral gastroenteritis    Labs: CBC, CMP- significant for AST 105, ALT 81, alkaline phosphatase 196  Imaging: none  Consults: none  Therapeutics: Zofran, NS  Assessment: viral gastroenteritis   Plan: Patient presents with 4 days of nausea vomiting diarrhea. Presentation likely represents viral gastroenteritis, she is nontender on exam, no fever, no leukocytosis. Patient does have a history of malignancy, most recent evaluation at the end of April showed stable disease. CT scan of abdomen and pelvis on 12/03/2014 showed multiple lesions on the liver, 2014 similar presentation with biopsy showing this was sarcoidosis and not metastatic disease. Due to patient's history of sarcoid with liver involvement, this could likely represent her transient elevated LFTs. Patient has no fever, abdominal pain, jaundice, or any other concerning signs or symptoms that would indicate either obstruction or infection. She was given normal saline and Zofran, with improvement in symptoms, she is able to drink ginger ale without nausea or vomiting; repeat abdominal exam  continued to show no signs of tenderness. Patient is instructed to Zofran as needed for nausea, drink plenty of fluids, BRAT diet, and follow up with her primary care provider for further evaluation and management. Strict return precautions given in  the event that worsening signs or symptoms present, she verbalized her understanding to these plan insured her follow-up evaluation      Okey Regal, PA-C 01/01/15 [place], MD 01/01/15 2004

## 2015-01-01 NOTE — ED Notes (Signed)
MD at bedside. 

## 2015-01-01 NOTE — ED Notes (Signed)
Questions, concerns denied. Pt ambulatory and a&ox4

## 2015-01-01 NOTE — Discharge Instructions (Signed)
Please use Zofran as needed for nausea, maintain adequate by mouth intake including a Brat diet; bananas right apples and toast until diarrhea discontinuance. Please monitor for new or worsening signs or symptoms and return immediately if any present. Please contact her primary care provider inform them of your visit and relevant information including elevated LFTs.

## 2015-01-01 NOTE — ED Notes (Signed)
Pt states nausea, vomiting, and diarrhea since Saturday, with increasing weakness and inability to keep food down. Pt states she is doing chemo, last treatment one month ago. Denies fever/chills/abd pain.

## 2015-01-02 ENCOUNTER — Observation Stay (HOSPITAL_COMMUNITY)
Admission: EM | Admit: 2015-01-02 | Discharge: 2015-01-04 | Disposition: A | Discharge status: Home / Self Care | Payer: BLUE CROSS/BLUE SHIELD | Attending: Internal Medicine | Admitting: Internal Medicine

## 2015-01-02 ENCOUNTER — Encounter (HOSPITAL_COMMUNITY): Payer: Self-pay | Admitting: Emergency Medicine

## 2015-01-02 DIAGNOSIS — E876 Hypokalemia: Secondary | ICD-10-CM | POA: Diagnosis present

## 2015-01-02 DIAGNOSIS — R7989 Other specified abnormal findings of blood chemistry: Secondary | ICD-10-CM | POA: Diagnosis present

## 2015-01-02 DIAGNOSIS — R945 Abnormal results of liver function studies: Secondary | ICD-10-CM

## 2015-01-02 DIAGNOSIS — E860 Dehydration: Secondary | ICD-10-CM | POA: Diagnosis not present

## 2015-01-02 DIAGNOSIS — Z882 Allergy status to sulfonamides status: Secondary | ICD-10-CM | POA: Diagnosis not present

## 2015-01-02 DIAGNOSIS — R112 Nausea with vomiting, unspecified: Principal | ICD-10-CM | POA: Diagnosis present

## 2015-01-02 DIAGNOSIS — E785 Hyperlipidemia, unspecified: Secondary | ICD-10-CM | POA: Diagnosis not present

## 2015-01-02 DIAGNOSIS — Z9013 Acquired absence of bilateral breasts and nipples: Secondary | ICD-10-CM | POA: Insufficient documentation

## 2015-01-02 DIAGNOSIS — E119 Type 2 diabetes mellitus without complications: Secondary | ICD-10-CM

## 2015-01-02 DIAGNOSIS — Z853 Personal history of malignant neoplasm of breast: Secondary | ICD-10-CM | POA: Diagnosis not present

## 2015-01-02 DIAGNOSIS — R197 Diarrhea, unspecified: Secondary | ICD-10-CM | POA: Diagnosis not present

## 2015-01-02 DIAGNOSIS — C50912 Malignant neoplasm of unspecified site of left female breast: Secondary | ICD-10-CM | POA: Diagnosis present

## 2015-01-02 LAB — COMPREHENSIVE METABOLIC PANEL
ALBUMIN: 3.5 g/dL (ref 3.5–5.0)
ALT: 80 U/L — ABNORMAL HIGH (ref 14–54)
AST: 114 U/L — AB (ref 15–41)
Alkaline Phosphatase: 181 U/L — ABNORMAL HIGH (ref 38–126)
Anion gap: 13 (ref 5–15)
BUN: 8 mg/dL (ref 6–20)
CALCIUM: 8.1 mg/dL — AB (ref 8.9–10.3)
CO2: 24 mmol/L (ref 22–32)
CREATININE: 0.81 mg/dL (ref 0.44–1.00)
Chloride: 102 mmol/L (ref 101–111)
GFR calc Af Amer: >60 mL/min (ref >60)
GFR calc non Af Amer: >60 mL/min (ref >60)
Glucose, Bld: 101 mg/dL — ABNORMAL HIGH (ref 65–99)
Potassium: 3.3 mmol/L — ABNORMAL LOW (ref 3.5–5.1)
SODIUM: 139 mmol/L (ref 135–145)
Total Bilirubin: 1.2 mg/dL (ref 0.3–1.2)
Total Protein: 8 g/dL (ref 6.5–8.1)

## 2015-01-02 LAB — CBC WITH DIFFERENTIAL/PLATELET
BASOS PCT: 1 % (ref 0–1)
Basophils Absolute: 0 10*3/uL (ref 0.0–0.1)
EOS PCT: 3 % (ref 0–5)
Eosinophils Absolute: 0.2 10*3/uL (ref 0.0–0.7)
HEMATOCRIT: 34.8 % — AB (ref 36.0–46.0)
HEMOGLOBIN: 11.5 g/dL — AB (ref 12.0–15.0)
Lymphocytes Relative: 18 % (ref 12–46)
Lymphs Abs: 0.9 10*3/uL (ref 0.7–4.0)
MCH: 27 pg (ref 26.0–34.0)
MCHC: 33 g/dL (ref 30.0–36.0)
MCV: 81.7 fL (ref 78.0–100.0)
MONOS PCT: 8 % (ref 3–12)
Monocytes Absolute: 0.4 10*3/uL (ref 0.1–1.0)
NEUTROS ABS: 3.3 10*3/uL (ref 1.7–7.7)
Neutrophils Relative %: 70 % (ref 43–77)
Platelets: 231 10*3/uL (ref 150–400)
RBC: 4.26 MIL/uL (ref 3.87–5.11)
RDW: 16 % — ABNORMAL HIGH (ref 11.5–15.5)
WBC: 4.7 10*3/uL (ref 4.0–10.5)

## 2015-01-02 LAB — URINALYSIS, ROUTINE W REFLEX MICROSCOPIC
Glucose, UA: NEGATIVE mg/dL
Ketones, ur: >80 mg/dL — AB
Nitrite: NEGATIVE
PH: 5.5 (ref 5.0–8.0)
Protein, ur: 30 mg/dL — AB
SPECIFIC GRAVITY, URINE: 1.017 (ref 1.005–1.030)
UROBILINOGEN UA: 1 mg/dL (ref 0.0–1.0)

## 2015-01-02 LAB — LIPASE, BLOOD: Lipase: 22 U/L (ref 22–51)

## 2015-01-02 LAB — URINE MICROSCOPIC-ADD ON

## 2015-01-02 LAB — I-STAT BETA HCG BLOOD, ED (MC, WL, AP ONLY): I-stat hCG, quantitative: <5 m[IU]/mL (ref <5)

## 2015-01-02 MED ORDER — SODIUM CHLORIDE 0.9 % IV BOLUS (SEPSIS)
1000.0000 mL | INTRAVENOUS | Status: AC
  Administered 2015-01-02: 1000 mL via INTRAVENOUS

## 2015-01-02 MED ORDER — ONDANSETRON HCL 4 MG/2ML IJ SOLN
4.0000 mg | Freq: Once | INTRAMUSCULAR | Status: AC
  Administered 2015-01-02: 4 mg via INTRAVENOUS
  Filled 2015-01-02: qty 2

## 2015-01-02 NOTE — ED Provider Notes (Signed)
CSN: [id number]     Arrival date & time 01/02/15  1755 History   First MD Initiated Contact with Patient 01/02/15 1911     Chief Complaint  Patient presents with  . Fatigue     (Consider location/radiation/quality/duration/timing/severity/associated sxs/prior Treatment) Patient is a 44 y.o. female presenting with vomiting. The history is provided by the patient.  Emesis Severity:  Mild Duration:  5 days Timing:  Intermittent Quality:  Stomach contents Able to tolerate:  Liquids Progression:  Partially resolved Chronicity:  New Relieved by:  Nothing Worsened by:  Nothing tried Ineffective treatments:  None tried Associated symptoms: diarrhea   Associated symptoms: no headaches   Diarrhea:    Quality:  Watery   Severity:  Mild   Progression:  Resolved   Past Medical History  Diagnosis Date  . Hyperlipidemia     diet therapy per patient request  . 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 Hematuria 2000/2001    evaluted in [place] with negative results  . Breast CA     left  . S/P radiation therapy  05/10/2013-06/23/2013    Left chest wall / 50.4 Gray @ 1.8 Gray per fraction x 28 fractions/ Left Supraclavicular fossa / 65 Gray @1 .8 Gray per fraction x 25 fractions/ Left PAB / 45 Gy at 1.8 Gray per fraction x 25 fractions/Left scar / 10 Gray at [employer] per fraction x 5 fractions     . Status post chemotherapy      Radiation Therapy Radiosensitizing Xeloda  . Status post chemotherapy  09/26/2012 - 11/07/2012      Neoadjuvant chemotherapy consisting of 4 cycles of a.c. From 09/26/2012 through 11/07/2012 she then received 12 weeks of paclitaxel.  . Use of tamoxifen (Nolvadex) started 06/26/13  . Type II diabetes mellitus     diet therapy    Past Surgical History  Procedure Laterality Date  . Rectocele repair  2004  . Cystoscopy  2001  . Nevus excision  2004    facial  . Portacath placement  09/20/2012    Procedure: INSERTION PORT-A-CATH;  Surgeon: Adin Hector, MD;  Location: [place];  Service: General;  Laterality: N/A;  port a cath insertion with flouro and ultrasound   . Mastectomy Right 04/10/2013  . Mastectomy modified radical Left 04/10/2013  . Axillary sentinel node biopsy Right 04/10/2013  . Liver biopsy    . Breast biopsy Left 2014  . Tubal ligation  2004  . Mastectomy modified radical Left 04/10/2013    Procedure: LEFT MODIFIED RADICAL MASTECTOMY ;  Surgeon: Adin Hector, MD;  Location: [place];  Service: General;  Laterality: Left;  . Simple mastectomy with axillary sentinel node biopsy Right 04/10/2013    Procedure: RIGHT TOTAL  MASTECTOMY WITH AXILLARY SENTINEL NODE BIOPSY;  Surgeon: Adin Hector, MD;  Location: [facility];  Service: General;  Laterality: Right;  Methylene Blue Injection   Family History  Problem Relation Age of Onset  . Hypertension Mother   . Diabetes Mother     MI  . Heart disease Mother   . Hypertension Sister     x4 only one HTN and one thats DI  . Hypertension Brother     x4 only 1 htn & dm  . Heart disease Father   . Breast cancer Maternal Aunt     diagnosed in her 70s  . Cancer Maternal Aunt     unknown  . Breast cancer Paternal Aunt  diagnosed in her 44s  . Cervical cancer Sister 12  . Breast cancer Maternal Aunt     diagnosed in her 34s  . Breast cancer Cousin     3 maternal cousins - 2 in their 37s, 1 in her 108s   History  Substance Use Topics  . Smoking status: Never Smoker   . Smokeless tobacco: Never Used  . Alcohol Use: No   OB History    No data available     Review of Systems  Constitutional: Negative for fever and fatigue.  HENT: Negative for congestion and drooling.   Eyes: Negative for pain.  Respiratory: Negative for cough and shortness of breath.   Cardiovascular: Negative for chest pain.  Gastrointestinal: Positive for nausea, vomiting and diarrhea.  Genitourinary: Negative for dysuria and hematuria.  Musculoskeletal: Negative for back pain, gait problem and neck  pain.  Skin: Negative for color change.  Neurological: Negative for dizziness and headaches.  Hematological: Negative for adenopathy.  Psychiatric/Behavioral: Negative for behavioral problems.  All other systems reviewed and are negative.     Allergies  Metronidazole and Sulfonamide derivatives  Home Medications   Prior to Admission medications   Medication Sig Start Date End Date Taking? Authorizing Provider  fluticasone (FLONASE) 50 MCG/ACT nasal spray Place 1-2 sprays into both nostrils once as needed for allergies or rhinitis.    Historical Provider, MD  lidocaine-prilocaine (EMLA) cream Apply 1 application topically as needed. 06/22/14   Laurie Panda, NP  ondansetron (ZOFRAN) 4 MG tablet Take 1 tablet (4 mg total) by mouth every 6 (six) hours. 01/01/15   Okey Regal, PA-C  palbociclib [facility]) 75 MG capsule Take 1 capsule (75 mg total) by mouth daily with breakfast. Take whole with food. Patient taking differently: Take 75 mg by mouth daily with breakfast. Take whole with food. 11/20/14   Nicholas Lose, MD  potassium chloride SA (K-DUR,KLOR-CON) 20 MEQ tablet Take 1 tablet (20 mEq total) by mouth 2 (two) times daily. 07/04/14   Nicholas Lose, MD   BP 150/83 mmHg  Pulse 106  Temp(Src) 98 F (36.7 C) (Oral)  SpO2 100% Physical Exam  Constitutional: She is oriented to person, place, and time. She appears well-developed and well-nourished.  HENT:  Head: Normocephalic.  Mouth/Throat: Oropharynx is clear and moist. No oropharyngeal exudate.  Eyes: Conjunctivae and EOM are normal. Pupils are equal, round, and reactive to light.  Neck: Normal range of motion. Neck supple.  Cardiovascular: Normal rate, regular rhythm, normal heart sounds and intact distal pulses.  Exam reveals no gallop and no friction rub.   No murmur heard. Pulmonary/Chest: Effort normal and breath sounds normal. No respiratory distress. She has no wheezes.  Abdominal: Soft. Bowel sounds are normal. There is  no tenderness. There is no rebound and no guarding.  Musculoskeletal: Normal range of motion. She exhibits no edema or tenderness.  Neurological: She is alert and oriented to person, place, and time.  Skin: Skin is warm and dry.  Psychiatric: She has a normal mood and affect. Her behavior is normal.  Nursing note and vitals reviewed.   ED Course  Procedures (including critical care time) Labs Review Labs Reviewed  CBC WITH DIFFERENTIAL/PLATELET - Abnormal; Notable for the following:    Hemoglobin 11.5 (*)    HCT 34.8 (*)    RDW 16.0 (*)    All other components within normal limits  COMPREHENSIVE METABOLIC PANEL - Abnormal; Notable for the following:    Potassium 3.3 (*)  Glucose, Bld 101 (*)    Calcium 8.1 (*)    AST 114 (*)    ALT 80 (*)    Alkaline Phosphatase 181 (*)    All other components within normal limits  URINALYSIS, ROUTINE W REFLEX MICROSCOPIC - Abnormal; Notable for the following:    Color, Urine AMBER (*)    APPearance CLOUDY (*)    Hgb urine dipstick LARGE (*)    Bilirubin Urine SMALL (*)    Ketones, ur >80 (*)    Protein, ur 30 (*)    Leukocytes, UA SMALL (*)    All other components within normal limits  URINE MICROSCOPIC-ADD ON - Abnormal; Notable for the following:    Casts HYALINE CASTS (*)    All other components within normal limits  LIPASE, BLOOD  I-STAT BETA HCG BLOOD, ED ([facility], [facility], [facility] ONLY)    Imaging Review No results found.   EKG Interpretation None      MDM   Final diagnoses:  Nausea and vomiting, vomiting of unspecified type    8:16 PM 44 y.o. 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 Patient has not had significant abdominal pain. She was seen here yesterday with noncontributory workup, symptoms thought to be viral. She states that she had one episode of emesis today but has not had any more diarrhea. She states that her stomach feels unsettled  but has no focal tenderness. She has had some urinary frequency. Her abdomen remains benign and I do not think imaging is warranted. She notes that she has been tolerating by mouth still feels slightly dehydrated. Mildly tachycardic here but other vital signs unremarkable. Will repeat lab work and get urinalysis. Will treat symptomatically with IV fluids and nausea medicine.  Plan for admission to hospitalist as pt unable to tolerate po intake in ED.    Pamella Pert, MD 01/03/15 [phone]

## 2015-01-02 NOTE — ED Notes (Addendum)
Pt states that she came in last night to be treated and was given fluids but today she feels weak again. Pt has breast CA; last tx 1 month ago.  Alert and oriented.

## 2015-01-03 ENCOUNTER — Emergency Department (HOSPITAL_COMMUNITY): Payer: BLUE CROSS/BLUE SHIELD

## 2015-01-03 ENCOUNTER — Encounter (HOSPITAL_COMMUNITY): Payer: Self-pay | Admitting: Internal Medicine

## 2015-01-03 ENCOUNTER — Inpatient Hospital Stay (HOSPITAL_COMMUNITY): Payer: BLUE CROSS/BLUE SHIELD

## 2015-01-03 DIAGNOSIS — R945 Abnormal results of liver function studies: Secondary | ICD-10-CM

## 2015-01-03 DIAGNOSIS — R112 Nausea with vomiting, unspecified: Secondary | ICD-10-CM | POA: Diagnosis present

## 2015-01-03 DIAGNOSIS — E119 Type 2 diabetes mellitus without complications: Secondary | ICD-10-CM

## 2015-01-03 DIAGNOSIS — E860 Dehydration: Secondary | ICD-10-CM | POA: Diagnosis present

## 2015-01-03 DIAGNOSIS — C50912 Malignant neoplasm of unspecified site of left female breast: Secondary | ICD-10-CM | POA: Diagnosis not present

## 2015-01-03 DIAGNOSIS — R1110 Vomiting, unspecified: Secondary | ICD-10-CM

## 2015-01-03 DIAGNOSIS — C799 Secondary malignant neoplasm of unspecified site: Secondary | ICD-10-CM

## 2015-01-03 DIAGNOSIS — R7989 Other specified abnormal findings of blood chemistry: Secondary | ICD-10-CM | POA: Diagnosis present

## 2015-01-03 DIAGNOSIS — E876 Hypokalemia: Secondary | ICD-10-CM | POA: Diagnosis present

## 2015-01-03 LAB — COMPREHENSIVE METABOLIC PANEL
ALT: 72 U/L — AB (ref 14–54)
AST: 101 U/L — AB (ref 15–41)
Albumin: 3.2 g/dL — ABNORMAL LOW (ref 3.5–5.0)
Alkaline Phosphatase: 162 U/L — ABNORMAL HIGH (ref 38–126)
Anion gap: 14 (ref 5–15)
BUN: 7 mg/dL (ref 6–20)
CALCIUM: 7.5 mg/dL — AB (ref 8.9–10.3)
CHLORIDE: 105 mmol/L (ref 101–111)
CO2: 20 mmol/L — AB (ref 22–32)
Creatinine, Ser: 0.71 mg/dL (ref 0.44–1.00)
GFR calc Af Amer: >60 mL/min (ref >60)
GFR calc non Af Amer: >60 mL/min (ref >60)
Glucose, Bld: 89 mg/dL (ref 65–99)
Potassium: 3.1 mmol/L — ABNORMAL LOW (ref 3.5–5.1)
Sodium: 139 mmol/L (ref 135–145)
Total Bilirubin: 1.4 mg/dL — ABNORMAL HIGH (ref 0.3–1.2)
Total Protein: 7.2 g/dL (ref 6.5–8.1)

## 2015-01-03 LAB — GLUCOSE, CAPILLARY
GLUCOSE-CAPILLARY: 101 mg/dL — AB (ref 65–99)
GLUCOSE-CAPILLARY: 113 mg/dL — AB (ref 65–99)
GLUCOSE-CAPILLARY: 73 mg/dL (ref 65–99)
GLUCOSE-CAPILLARY: 83 mg/dL (ref 65–99)
GLUCOSE-CAPILLARY: 90 mg/dL (ref 65–99)
Glucose-Capillary: 77 mg/dL (ref 65–99)

## 2015-01-03 LAB — MAGNESIUM: Magnesium: 1.6 mg/dL — ABNORMAL LOW (ref 1.7–2.4)

## 2015-01-03 LAB — HEPATITIS PANEL, ACUTE
HCV Ab: NEGATIVE
HEP B S AG: NEGATIVE
Hep A IgM: NONREACTIVE
Hep B C IgM: NONREACTIVE

## 2015-01-03 LAB — CBC
HEMATOCRIT: 33.1 % — AB (ref 36.0–46.0)
Hemoglobin: 10.5 g/dL — ABNORMAL LOW (ref 12.0–15.0)
MCH: 26.1 pg (ref 26.0–34.0)
MCHC: 31.7 g/dL (ref 30.0–36.0)
MCV: 82.1 fL (ref 78.0–100.0)
Platelets: 219 10*3/uL (ref 150–400)
RBC: 4.03 MIL/uL (ref 3.87–5.11)
RDW: 16 % — AB (ref 11.5–15.5)
WBC: 4 10*3/uL (ref 4.0–10.5)

## 2015-01-03 LAB — TSH: TSH: 1.228 u[IU]/mL (ref 0.350–4.500)

## 2015-01-03 LAB — PHOSPHORUS: PHOSPHORUS: 2 mg/dL — AB (ref 2.5–4.6)

## 2015-01-03 MED ORDER — METOCLOPRAMIDE HCL 5 MG/ML IJ SOLN
5.0000 mg | Freq: Four times a day (QID) | INTRAMUSCULAR | Status: DC
  Administered 2015-01-03 – 2015-01-04 (×5): 5 mg via INTRAVENOUS
  Filled 2015-01-03: qty 2
  Filled 2015-01-03: qty 1
  Filled 2015-01-03: qty 2
  Filled 2015-01-03: qty 1
  Filled 2015-01-03: qty 2
  Filled 2015-01-03 (×2): qty 1
  Filled 2015-01-03 (×2): qty 2

## 2015-01-03 MED ORDER — ACETAMINOPHEN 325 MG PO TABS
650.0000 mg | ORAL_TABLET | Freq: Four times a day (QID) | ORAL | Status: DC | PRN
  Administered 2015-01-03: 650 mg via ORAL
  Filled 2015-01-03: qty 2

## 2015-01-03 MED ORDER — ACETAMINOPHEN 650 MG RE SUPP: 650.0000 mg | Freq: Four times a day (QID) | RECTAL | Status: DC | PRN

## 2015-01-03 MED ORDER — CETYLPYRIDINIUM CHLORIDE 0.05 % MT LIQD: 7.0000 mL | Freq: Two times a day (BID) | OROMUCOSAL | Status: DC

## 2015-01-03 MED ORDER — SODIUM CHLORIDE 0.9 % IV SOLN
INTRAVENOUS | Status: DC
  Administered 2015-01-03: 04:00:00 via INTRAVENOUS

## 2015-01-03 MED ORDER — ENOXAPARIN SODIUM 40 MG/0.4ML ~~LOC~~ SOLN
40.0000 mg | SUBCUTANEOUS | Status: DC
  Administered 2015-01-03 – 2015-01-04 (×2): 40 mg via SUBCUTANEOUS
  Filled 2015-01-03 (×2): qty 0.4

## 2015-01-03 MED ORDER — ONDANSETRON HCL 4 MG PO TABS: 4.0000 mg | ORAL_TABLET | Freq: Four times a day (QID) | ORAL | Status: DC | PRN

## 2015-01-03 MED ORDER — INSULIN ASPART 100 UNIT/ML ~~LOC~~ SOLN: 0.0000 [IU] | SUBCUTANEOUS | Status: DC

## 2015-01-03 MED ORDER — POTASSIUM CHLORIDE CRYS ER 20 MEQ PO TBCR
40.0000 meq | EXTENDED_RELEASE_TABLET | Freq: Once | ORAL | Status: AC
  Administered 2015-01-03: 40 meq via ORAL
  Filled 2015-01-03: qty 2

## 2015-01-03 MED ORDER — ONDANSETRON HCL 4 MG/2ML IJ SOLN
4.0000 mg | Freq: Four times a day (QID) | INTRAMUSCULAR | Status: DC | PRN
  Administered 2015-01-03 – 2015-01-04 (×3): 4 mg via INTRAVENOUS
  Filled 2015-01-03 (×3): qty 2

## 2015-01-03 MED ORDER — HYDROCODONE-ACETAMINOPHEN 5-325 MG PO TABS: 1.0000 | ORAL_TABLET | ORAL | Status: DC | PRN

## 2015-01-03 MED ORDER — POTASSIUM CHLORIDE 10 MEQ/100ML IV SOLN
10.0000 meq | INTRAVENOUS | Status: AC
  Administered 2015-01-03 (×2): 10 meq via INTRAVENOUS
  Filled 2015-01-03 (×2): qty 100

## 2015-01-03 NOTE — ED Notes (Signed)
Patient transported to X-ray 

## 2015-01-03 NOTE — H&P (Addendum)
PCP:  [name], MD  Oncology [facility]  Referring provider Aline Brochure   Chief Complaint: Nausea vomiting  HPI: Brenda Avery is a 44 y.o. female   has a past medical history of Hyperlipidemia; PVC's; Hematuria (2000/2001); Breast CA; S/P radiation therapy ( 05/10/2013-06/23/2013); Status post chemotherapy; Status post chemotherapy ( 09/26/2012 - 11/07/2012 ); Use of tamoxifen (Nolvadex) (started 06/26/13); and Type II diabetes mellitus.   Presented with 5 day history of nausea vomiting unable to tolerate anything by mouth. Patient did not endorse any fevers or chills she had some diarrhea last BM yesterday, she was seen for the same yesterday. He was able to be discharged home  but today she presents again at this point unable to control her nausea and vomiting. She has not been around any one who was ill. This episode started after she had some food at [facility] 2 weeks ago. Patient denies any neurological complaints no double vision. No unstable gait no headaches Patient has history of left breast cancer with   metastases to the bone. 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 Currently on Ibrance but she has not been taking it for the past 2.5 weeks.  Hospitalist was called for admission for intractable nausea and vomiting  Review of Systems:    Pertinent positives include:  abdominal pain, nausea, vomiting, diarrhea,  fatigue,   Constitutional:  No weight loss, night sweats, Fevers, chills,weight loss  HEENT:  No headaches, Difficulty swallowing,Tooth/dental problems,Sore throat,  No sneezing, itching, ear ache, nasal congestion, post nasal drip,  Cardio-vascular:  No chest pain, Orthopnea, PND, anasarca, dizziness, palpitations.no Bilateral lower extremity swelling  GI:  No heartburn, indigestion, change in bowel habits, loss of appetite, melena, blood in stool, hematemesis Resp:  no shortness of breath at rest. No dyspnea on exertion, No excess mucus, no productive  cough, No non-productive cough, No coughing up of blood.No change in color of mucus.No wheezing. Skin:  no rash or lesions. No jaundice GU:  no dysuria, change in color of urine, no urgency or frequency. No straining to urinate.  No flank pain.  Musculoskeletal:  No joint pain or no joint swelling. No decreased range of motion. No back pain.  Psych:  No change in mood or affect. No depression or anxiety. No memory loss.  Neuro: no localizing neurological complaints, no tingling, no weakness, no double vision, no gait abnormality, no slurred speech, no confusion  Otherwise ROS are negative except for above, 10 systems were reviewed  Past Medical History: Past Medical History  Diagnosis Date  . Hyperlipidemia     diet therapy per patient request  . 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 Hematuria 2000/2001    evaluted in [facility] with negative results  . Breast CA     left  . S/P radiation therapy  05/10/2013-06/23/2013    Left chest wall / 50.4 Gray @ 1.8 Gray per fraction x 28 fractions/ Left Supraclavicular fossa / 45 Gray @1 .8 Gray per fraction x 25 fractions/ Left PAB / 45 Gy at 1.8 Gray per fraction x 25 fractions/Left scar / 10 Gray at [employer] per fraction x 5 fractions     . Status post chemotherapy      Radiation Therapy Radiosensitizing Xeloda  . Status post chemotherapy  09/26/2012 - 11/07/2012      Neoadjuvant chemotherapy consisting of 4 cycles of a.c. From 09/26/2012 through 11/07/2012 she then received 12 weeks of paclitaxel.  Marland Kitchen  Use of tamoxifen (Nolvadex) started 06/26/13  . Type II diabetes mellitus     diet therapy    Past Surgical History  Procedure Laterality Date  . Rectocele repair  2004  . Cystoscopy  2001  . Nevus excision  2004    facial  . Portacath placement  09/20/2012    Procedure: INSERTION PORT-A-CATH;  Surgeon: Adin Hector, MD;  Location: [place];  Service: General;  Laterality: N/A;  port a cath insertion with flouro and ultrasound     . Mastectomy Right 04/10/2013  . Mastectomy modified radical Left 04/10/2013  . Axillary sentinel node biopsy Right 04/10/2013  . Liver biopsy    . Breast biopsy Left 2014  . Tubal ligation  2004  . Mastectomy modified radical Left 04/10/2013    Procedure: LEFT MODIFIED RADICAL MASTECTOMY ;  Surgeon: Adin Hector, MD;  Location: [place];  Service: General;  Laterality: Left;  . Simple mastectomy with axillary sentinel node biopsy Right 04/10/2013    Procedure: RIGHT TOTAL  MASTECTOMY WITH AXILLARY SENTINEL NODE BIOPSY;  Surgeon: Adin Hector, MD;  Location: [place];  Service: General;  Laterality: Right;  Methylene Blue Injection     Medications: Prior to Admission medications   Medication Sig Start Date End Date Taking? Authorizing Provider  fluticasone (FLONASE) 50 MCG/ACT nasal spray Place 1-2 sprays into both nostrils once as needed for allergies or rhinitis.   Yes Historical Provider, MD  lidocaine-prilocaine (EMLA) cream Apply 1 application topically as needed. 06/22/14  Yes Laurie Panda, NP  palbociclib (IBRANCE) 75 MG capsule Take 1 capsule (75 mg total) by mouth daily with breakfast. Take whole with food. Patient taking differently: Take 75 mg by mouth daily with breakfast. Take whole with food. 11/20/14  Yes Nicholas Lose, MD  potassium chloride SA (K-DUR,KLOR-CON) 20 MEQ tablet Take 1 tablet (20 mEq total) by mouth 2 (two) times daily. 07/04/14  Yes Nicholas Lose, MD  ondansetron (ZOFRAN) 4 MG tablet Take 1 tablet (4 mg total) by mouth every 6 (six) hours. 01/01/15   Okey Regal, PA-C    Allergies:   Allergies  Allergen Reactions  . Metronidazole Hives and Itching  . Sulfonamide Derivatives Hives, Itching and Rash    Social History:  Ambulatory  independently   Lives at home  With family     reports that she has never smoked. She has never used smokeless tobacco. She reports that she does not drink alcohol or use illicit drugs.    Family History: family history  includes Breast cancer in her cousin, maternal aunt, maternal aunt, and paternal aunt; Cancer in her maternal aunt; Cervical cancer (age of onset: 83) in her sister; Diabetes in her mother; Heart disease in her father and mother; Hypertension in her brother, mother, and sister.    Physical Exam: Patient Vitals for the past 24 hrs:  BP Temp Temp src Pulse Resp SpO2  01/02/15 2124 121/67 mmHg - - 75 15 100 %  01/02/15 1808 150/83 mmHg 98 F (36.7 C) Oral 106 - 100 %    1. General:  in No Acute distress 2. Psychological: Alert and   Oriented 3. Head/ENT:   Dry Mucous Membranes                          Head Non traumatic, neck supple  Normal   Dentition 4. SKIN:   decreased Skin turgor,  Skin clean Dry and intact no rash 5. Heart: Regular rate and rhythm no Murmur, Rub or gallop 6. Lungs: Clear to auscultation bilaterally, no wheezes or crackles   7. Abdomen: Soft, non-tender, Non distended 8. Lower extremities: no clubbing, cyanosis, or edema 9. Neurologically Grossly intact, moving all 4 extremities equally 10. MSK: Normal range of motion  body mass index is unknown because there is no weight on file.   Labs on Admission:   Results for orders placed or performed during the hospital encounter of 01/02/15 (from the past 24 hour(s))  Urinalysis, Routine w reflex microscopic     Status: Abnormal   Collection Time: 01/02/15  5:55 PM  Result Value Ref Range   Color, Urine AMBER (A) YELLOW   APPearance CLOUDY (A) CLEAR   Specific Gravity, Urine 1.017 1.005 - 1.030   pH 5.5 5.0 - 8.0   Glucose, UA NEGATIVE NEGATIVE mg/dL   Hgb urine dipstick LARGE (A) NEGATIVE   Bilirubin Urine SMALL (A) NEGATIVE   Ketones, ur >80 (A) NEGATIVE mg/dL   Protein, ur 30 (A) NEGATIVE mg/dL   Urobilinogen, UA 1.0 0.0 - 1.0 mg/dL   Nitrite NEGATIVE NEGATIVE   Leukocytes, UA SMALL (A) NEGATIVE  Urine microscopic-add on     Status: Abnormal   Collection Time: 01/02/15  5:55 PM    Result Value Ref Range   WBC, UA 3-6 <3 WBC/hpf   RBC / HPF 11-20 <3 RBC/hpf   Casts HYALINE CASTS (A) NEGATIVE   Urine-Other MUCOUS PRESENT   CBC with Differential/Platelet     Status: Abnormal   Collection Time: 01/02/15  8:01 PM  Result Value Ref Range   WBC 4.7 4.0 - 10.5 K/uL   RBC 4.26 3.87 - 5.11 MIL/uL   Hemoglobin 11.5 (L) 12.0 - 15.0 g/dL   HCT 34.8 (L) 36.0 - 46.0 %   MCV 81.7 78.0 - 100.0 fL   MCH 27.0 26.0 - 34.0 pg   MCHC 33.0 30.0 - 36.0 g/dL   RDW 16.0 (H) 11.5 - 15.5 %   Platelets 231 150 - 400 K/uL   Neutrophils Relative % 70 43 - 77 %   Neutro Abs 3.3 1.7 - 7.7 K/uL   Lymphocytes Relative 18 12 - 46 %   Lymphs Abs 0.9 0.7 - 4.0 K/uL   Monocytes Relative 8 3 - 12 %   Monocytes Absolute 0.4 0.1 - 1.0 K/uL   Eosinophils Relative 3 0 - 5 %   Eosinophils Absolute 0.2 0.0 - 0.7 K/uL   Basophils Relative 1 0 - 1 %   Basophils Absolute 0.0 0.0 - 0.1 K/uL  Comprehensive metabolic panel     Status: Abnormal   Collection Time: 01/02/15  8:01 PM  Result Value Ref Range   Sodium 139 135 - 145 mmol/L   Potassium 3.3 (L) 3.5 - 5.1 mmol/L   Chloride 102 101 - 111 mmol/L   CO2 24 22 - 32 mmol/L   Glucose, Bld 101 (H) 65 - 99 mg/dL   BUN 8 6 - 20 mg/dL   Creatinine, Ser 0.81 0.44 - 1.00 mg/dL   Calcium 8.1 (L) 8.9 - 10.3 mg/dL   Total Protein 8.0 6.5 - 8.1 g/dL   Albumin 3.5 3.5 - 5.0 g/dL   AST 114 (H) 15 - 41 U/L   ALT 80 (H) 14 - 54 U/L   Alkaline Phosphatase 181 (H) 38 - 126 U/L  Total Bilirubin 1.2 0.3 - 1.2 mg/dL   GFR calc non Af Amer >60 >60 mL/min   GFR calc Af Amer >60 >60 mL/min   Anion gap 13 5 - 15  Lipase, blood     Status: None   Collection Time: 01/02/15  8:01 PM  Result Value Ref Range   Lipase 22 22 - 51 U/L  I-Stat Beta hCG blood, ED ([facility], [facility], [facility] only)     Status: None   Collection Time: 01/02/15 10:17 PM  Result Value Ref Range   I-stat hCG, quantitative <5.0 <5 mIU/mL   Comment 3            UA some red blood cells and mucus present  ketones over 80  Lab Results  Component Value Date   HGBA1C 6.5* 05/15/2010   HGBA1C 6.5 05/15/2010   HGBA1C 6.5 05/15/2010    CrCl cannot be calculated (Unknown ideal weight.).  BNP (last 3 results) No results for input(s): PROBNP in the last 8760 hours.  Other results:  I have pearsonaly reviewed this: ECG not OBTAINED    There were no vitals filed for this visit.   Cultures:    Component Value Date/Time   SDES URINE, CLEAN CATCH 04/04/2013 0928   SPECREQUEST NONE 04/04/2013 0928   CULT NO GROWTH Performed at [facility] 04/04/2013 0928   REPTSTATUS 04/05/2013 FINAL 04/04/2013 0928     Radiological Exams on Admission: No results found.  Chart has been reviewed  Family  at  Bedside  plan of care was discussed with Niece  Assessment/Plan 44 year old female history of breast cancer metastasis to the bone presents with nausea vomiting diarrhea which has been persistent  Present on Admission:  . Carcinoma of left breast metastatic to multiple sites - no need to notify oncology in the morning the patient has been admitted. No need to review: Chemotherapy regimen to see if her symptoms have been related.  . Intractable nausea and vomiting - obtain KUB. We'll attempt to treat with Zofran and Reglan. Supportive management. Cause at this point unclear. Patient denies any neurological complaints and also no nausea and diarrhea has been associated diarrhea which makes central nausea much less likely. Abdomen appears to be benign. Patient afebrile  Diarrhea will obtain stool cultures patient had no significant exposure to antibiotic C. difficile my stress likely but given ongoing exposure to medical system and decreased immune status we'll check for C. difficile  . Dehydration administer IV fluids and resuscitate Given elevated LFTs and history of hepatic sarcoidosis. We'll obtain ultrasound of the liver for now to evaluate for any obstructive process and or gallbladder  disease. We'll also obtain hepatitis serology.  Hypokalemia will replace    Prophylaxis:   Lovenox   CODE STATUS:  FULL CODE  as per patient    Disposition:  To home once workup is complete and patient is stable  Other plan as per orders.  I have spent a total of  55 min on this admission  Kadajah Kjos 01/03/2015, 12:01 AM  [facility]  Pager [phone]   after 2 AM please page floor coverage PA If 7AM-7PM, please contact the day team taking care of the patient  [email or web address]  Password [email or web address]

## 2015-01-03 NOTE — Progress Notes (Signed)
PATIENT DETAILS Name: Brenda Avery Age: 44 y.o. Sex: female Date of Birth: 1971-02-14 Admit Date: 01/02/2015 Admitting Physician Toy Baker, MD [id number] Moshe Cipro, MD  Subjective: No further diarrhea or vomiting since last night. She is much better.  Assessment/Plan: Active Problems:   Nausea vomiting and diarrhea: ? etiology-likely viral gastroenteritis. Continue supportive care. Advance diet. Since seems to be improving will hold off on further investigations. Continue to follow closely.    Dehydration: Resolved. Clinically euvolemic. Stop IV fluids.    Hypokalemia: Secondary to GI loss. Replete and recheck in a.m.    Metastatic left breast cancer: Out patient follow-up with oncology. Curbsided primary oncologist-Dr Gudena-currently don't think that this is secondary to any of her antineoplastic agents-as apparently this has not changed over the past few months.    Mildly elevated LFTs: Has history of liver sarcoidosis-probably worsened from viral syndrome. Recheck in a.m.     History of sarcoidosis of the liver:  Per Dr.Gudena-lesions in liver are not malignant-but likely secondary to sarcoidosis-apparently they have been biopsied in the past. We will defer further workup/investigations if needed at all to the outpatient setting    DM II (diabetes mellitus, type II), controlled: Await A1c, does not appear to be on any diabetic medications-continue SSI.  Disposition: Remain inpatient  Antimicrobial agents  See below  Anti-infectives    None      DVT Prophylaxis: Prophylactic Lovenox   Code Status: Full code   Family Communication None at bedside-patient fully awake and alert.  Procedures: None  CONSULTS:  None  Time spent 30 minutes-Greater than 50% of this time was spent in counseling, explanation of diagnosis, planning of further management, and coordination of care.  MEDICATIONS: Scheduled Meds: . antiseptic oral rinse  7 mL  Mouth Rinse q12n4p  . enoxaparin (LOVENOX) injection  40 mg Subcutaneous Q24H  . insulin aspart  0-9 Units Subcutaneous 6 times per day  . metoCLOPramide (REGLAN) injection  5 mg Intravenous 4 times per day   Continuous Infusions:  PRN Meds:.acetaminophen **OR** acetaminophen, HYDROcodone-acetaminophen, ondansetron **OR** ondansetron (ZOFRAN) IV    PHYSICAL EXAM: Vital signs in last 24 hours: Filed Vitals:   01/03/15 0250 01/03/15 0400 01/03/15 0550 01/03/15 0900  BP: 128/76   116/75  Pulse: 76  82 65  Temp: 99.2 F (37.3 C)  98.8 F (37.1 C) 97.8 F (36.6 C)  TempSrc: Oral  Oral Oral  Resp: 18  18 18   Height:  5\' 4"  (1.626 m)    Weight:  81.8 kg (180 lb 5.4 oz)    SpO2: 99%  99% 99%    Weight change:  Filed Weights   01/03/15 0400  Weight: 81.8 kg (180 lb 5.4 oz)   Body mass index is 30.94 kg/(m^2).   Gen Exam: Awake and alert with clear speech.   Neck: Supple, No JVD.   Chest: B/L Clear.   CVS: S1 S2 Regular, no murmurs.  Abdomen: soft, BS +, non tender, non distended.  Extremities: no edema, lower extremities warm to touch. Neurologic: Non Focal.   Skin: No Rash.   Wounds: N/A.   Intake/Output from previous day:  Intake/Output Summary (Last 24 hours) at 01/03/15 1503 Last data filed at 01/03/15 0715  Gross per 24 hour  Intake 618.75 ml  Output    500 ml  Net 118.75 ml     LAB RESULTS: CBC  Recent Labs Lab 01/01/15 1713 01/02/15  2001 01/03/15 0425  WBC 4.0 4.7 4.0  HGB 12.0 11.5* 10.5*  HCT 37.1 34.8* 33.1*  PLT 248 231 219  MCV 81.9 81.7 82.1  MCH 26.5 27.0 26.1  MCHC 32.3 33.0 31.7  RDW 16.0* 16.0* 16.0*  LYMPHSABS 0.8 0.9  --   MONOABS 0.4 0.4  --   EOSABS 0.2 0.2  --   BASOSABS 0.0 0.0  --     Chemistries   Recent Labs Lab 01/01/15 1713 01/02/15 2001 01/03/15 0425  NA 141 139 139  K 3.3* 3.3* 3.1*  CL 103 102 105  CO2 25 24 20*  GLUCOSE 100* 101* 89  BUN 11 8 7   CREATININE 0.90 0.81 0.71  CALCIUM 8.8* 8.1* 7.5*  MG  --    --  1.6*    CBG:  Recent Labs Lab 01/03/15 0326 01/03/15 0720 01/03/15 1144  GLUCAP 90 73 77    GFR Estimated Creatinine Clearance: 91.8 mL/min (by C-G formula based on Cr of 0.71).  Coagulation profile No results for input(s): INR, PROTIME in the last 168 hours.  Cardiac Enzymes No results for input(s): CKMB, TROPONINI, MYOGLOBIN in the last 168 hours.  Invalid input(s): CK  Invalid input(s): POCBNP No results for input(s): DDIMER in the last 72 hours. No results for input(s): HGBA1C in the last 72 hours. No results for input(s): CHOL, HDL, LDLCALC, TRIG, CHOLHDL, LDLDIRECT in the last 72 hours.  Recent Labs  01/03/15 0425  TSH 1.228   No results for input(s): VITAMINB12, FOLATE, FERRITIN, TIBC, IRON, RETICCTPCT in the last 72 hours.  Recent Labs  01/02/15 2001  LIPASE 22    Urine Studies No results for input(s): UHGB, CRYS in the last 72 hours.  Invalid input(s): UACOL, UAPR, USPG, UPH, UTP, UGL, UKET, UBIL, UNIT, UROB, ULEU, UEPI, UWBC, URBC, UBAC, CAST, UCOM, BILUA  MICROBIOLOGY: No results found for this or any previous visit (from the past 240 hour(s)).  RADIOLOGY STUDIES/RESULTS: Dg Abd 1 View  01/03/2015   CLINICAL DATA:  Nausea and vomiting. Upper abdominal pain. Symptoms for 3 days.  EXAM: ABDOMEN - 1 VIEW  COMPARISON:  CT 12/03/2014  FINDINGS: There is no evidence of free intra-abdominal air. No dilated bowel loops to suggest obstruction. Small volume of stool throughout the colon. No radiopaque calculi. Left tubal ligation clip is seen. There are pelvic phleboliths. No acute osseous abnormalities are seen.  IMPRESSION: Normal bowel gas pattern.   Electronically Signed   By: Jeb Levering M.D.   On: 01/03/2015 01:58   US Abdomen Limited Ruq  01/03/2015   CLINICAL DATA:  Elevated liver function tests. History of breast cancer.  EXAM: US ABDOMEN LIMITED - RIGHT UPPER QUADRANT  COMPARISON:  CT scan of December 03, 2014.  FINDINGS: Gallbladder:  No  gallstones or wall thickening visualized. No sonographic Murphy sign noted.  Common bile duct:  Diameter: 3.7 mm which is within normal limits.  Liver:  Multiple hypoechoic rounded hypoechoic areas are noted throughout hepatic parenchyma consistent with metastatic disease. The largest measures 3.7 cm in diameter.  IMPRESSION: Findings consistent with multiple hepatic metastases. No gallstones or evidence of biliary obstruction is noted.   Electronically Signed   By: Marijo Conception, M.D.   On: 01/03/2015 08:58    Oren Binet, MD  [facility] Pager:[phone]  If 7PM-7AM, please contact night-coverage [email or web address] Password [email or web address] 01/03/2015, 3:03 PM   LOS: 0 days

## 2015-01-04 DIAGNOSIS — C50912 Malignant neoplasm of unspecified site of left female breast: Secondary | ICD-10-CM | POA: Diagnosis not present

## 2015-01-04 DIAGNOSIS — R7989 Other specified abnormal findings of blood chemistry: Secondary | ICD-10-CM | POA: Diagnosis not present

## 2015-01-04 DIAGNOSIS — E876 Hypokalemia: Secondary | ICD-10-CM | POA: Diagnosis not present

## 2015-01-04 DIAGNOSIS — E860 Dehydration: Secondary | ICD-10-CM | POA: Diagnosis not present

## 2015-01-04 LAB — HEMOGLOBIN A1C
HEMOGLOBIN A1C: 6.8 % — AB (ref 4.8–5.6)
Mean Plasma Glucose: 148 mg/dL

## 2015-01-04 LAB — COMPREHENSIVE METABOLIC PANEL
ALBUMIN: 3.3 g/dL — AB (ref 3.5–5.0)
ALT: 71 U/L — ABNORMAL HIGH (ref 14–54)
ANION GAP: 12 (ref 5–15)
AST: 98 U/L — ABNORMAL HIGH (ref 15–41)
Alkaline Phosphatase: 163 U/L — ABNORMAL HIGH (ref 38–126)
BUN: 5 mg/dL — ABNORMAL LOW (ref 6–20)
CALCIUM: 7 mg/dL — AB (ref 8.9–10.3)
CO2: 21 mmol/L — AB (ref 22–32)
Chloride: 106 mmol/L (ref 101–111)
Creatinine, Ser: 0.65 mg/dL (ref 0.44–1.00)
GFR calc non Af Amer: >60 mL/min (ref >60)
GLUCOSE: 100 mg/dL — AB (ref 65–99)
Potassium: 3.7 mmol/L (ref 3.5–5.1)
Sodium: 139 mmol/L (ref 135–145)
Total Bilirubin: 1.3 mg/dL — ABNORMAL HIGH (ref 0.3–1.2)
Total Protein: 7.1 g/dL (ref 6.5–8.1)

## 2015-01-04 LAB — CBC
HCT: 32.4 % — ABNORMAL LOW (ref 36.0–46.0)
Hemoglobin: 10.6 g/dL — ABNORMAL LOW (ref 12.0–15.0)
MCH: 26.8 pg (ref 26.0–34.0)
MCHC: 32.7 g/dL (ref 30.0–36.0)
MCV: 81.8 fL (ref 78.0–100.0)
Platelets: 225 10*3/uL (ref 150–400)
RBC: 3.96 MIL/uL (ref 3.87–5.11)
RDW: 16 % — ABNORMAL HIGH (ref 11.5–15.5)
WBC: 3.7 10*3/uL — ABNORMAL LOW (ref 4.0–10.5)

## 2015-01-04 LAB — GLUCOSE, CAPILLARY
GLUCOSE-CAPILLARY: 97 mg/dL (ref 65–99)
Glucose-Capillary: 97 mg/dL (ref 65–99)
Glucose-Capillary: 89 mg/dL (ref 65–99)

## 2015-01-04 MED ORDER — ONDANSETRON HCL 4 MG PO TABS: 4.0000 mg | ORAL_TABLET | Freq: Three times a day (TID) | ORAL | Status: DC | PRN

## 2015-01-04 NOTE — Discharge Summary (Signed)
PATIENT DETAILS Name: Brenda Avery Age: 44 y.o. Sex: female Date of Birth: [birth year] MRN: [id number]. Admitting Physician: Toy Baker, MD ION:[id number] Moshe Cipro, MD  Admit Date: 01/02/2015 Discharge date: 01/04/2015  Recommendations for Outpatient Follow-up:  1. Please check LFTs and chemistries in the next 1-2 weeks. 2. Ensure follow-up with oncology  3. A1c 6.8-prior A1c was at 6.5-continue diet and lifestyle modification. Defer initiation of oral hypoglycemic agents to PCP when acute illness has completely resolved.  PRIMARY DISCHARGE DIAGNOSIS:  Active Problems:   DM II (diabetes mellitus, type II), controlled   Carcinoma of left breast metastatic to multiple sites   Intractable nausea and vomiting   Dehydration   Elevated LFTs   Hypokalemia      PAST MEDICAL HISTORY: Past Medical History  Diagnosis Date  . Hyperlipidemia     diet therapy per patient request  . 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 Hematuria 2000/2001    evaluted in [place] with negative results  . Breast CA     left  . S/P radiation therapy  05/10/2013-06/23/2013    Left chest wall / 50.4 Gray @ 1.8 Gray per fraction x 28 fractions/ Left Supraclavicular fossa / 19 Gray @1 .8 Gray per fraction x 25 fractions/ Left PAB / 45 Gy at 1.8 Gray per fraction x 25 fractions/Left scar / 10 Gray at [employer] per fraction x 5 fractions     . Status post chemotherapy      Radiation Therapy Radiosensitizing Xeloda  . Status post chemotherapy  09/26/2012 - 11/07/2012      Neoadjuvant chemotherapy consisting of 4 cycles of a.c. From 09/26/2012 through 11/07/2012 she then received 12 weeks of paclitaxel.  . Use of tamoxifen (Nolvadex) started 06/26/13  . Type II diabetes mellitus     diet therapy     DISCHARGE MEDICATIONS: Current Discharge Medication List    CONTINUE these medications which have CHANGED   Details  ondansetron (ZOFRAN) 4 MG tablet Take 1 tablet (4 mg total) by mouth every 8  (eight) hours as needed for nausea or vomiting. Qty: 30 tablet, Refills: 0      CONTINUE these medications which have NOT CHANGED   Details  fluticasone (FLONASE) 50 MCG/ACT nasal spray Place 1-2 sprays into both nostrils once as needed for allergies or rhinitis.    lidocaine-prilocaine (EMLA) cream Apply 1 application topically as needed. Qty: 30 g, Refills: 0    palbociclib (IBRANCE) 75 MG capsule Take 1 capsule (75 mg total) by mouth daily with breakfast. Take whole with food. Qty: 21 capsule, Refills: 1   Associated Diagnoses: Carcinoma of left breast metastatic to multiple sites    potassium chloride SA (K-DUR,KLOR-CON) 20 MEQ tablet Take 1 tablet (20 mEq total) by mouth 2 (two) times daily. Qty: 60 tablet, Refills: 1   Associated Diagnoses: Breast carcinoma metastatic to multiple sites, unspecified laterality        ALLERGIES:   Allergies  Allergen Reactions  . Metronidazole Hives and Itching  . Sulfonamide Derivatives Hives, Itching and Rash    BRIEF HPI:  See H&P, Labs, Consult and Test reports for all details in brief, patient is a 44 year old female with history of breast cancer admitted for persistent nausea, vomiting and diarrhea for 4 days prior to this admission.  CONSULTATIONS:   None  PERTINENT RADIOLOGIC STUDIES: Dg Abd 1 View  01/03/2015   CLINICAL DATA:  Nausea and vomiting. Upper abdominal pain. Symptoms for 3 days.  EXAM: ABDOMEN - 1 VIEW  COMPARISON:  CT 12/03/2014  FINDINGS: There is no evidence of free intra-abdominal air. No dilated bowel loops to suggest obstruction. Small volume of stool throughout the colon. No radiopaque calculi. Left tubal ligation clip is seen. There are pelvic phleboliths. No acute osseous abnormalities are seen.  IMPRESSION: Normal bowel gas pattern.   Electronically Signed   By: Jeb Levering M.D.   On: 01/03/2015 01:58   US Abdomen Limited Ruq  01/03/2015   CLINICAL DATA:  Elevated liver function tests. History of breast  cancer.  EXAM: US ABDOMEN LIMITED - RIGHT UPPER QUADRANT  COMPARISON:  CT scan of December 03, 2014.  FINDINGS: Gallbladder:  No gallstones or wall thickening visualized. No sonographic Murphy sign noted.  Common bile duct:  Diameter: 3.7 mm which is within normal limits.  Liver:  Multiple hypoechoic rounded hypoechoic areas are noted throughout hepatic parenchyma consistent with metastatic disease. The largest measures 3.7 cm in diameter.  IMPRESSION: Findings consistent with multiple hepatic metastases. No gallstones or evidence of biliary obstruction is noted.   Electronically Signed   By: Marijo Conception, M.D.   On: 01/03/2015 08:58     PERTINENT LAB RESULTS: CBC:  Recent Labs  01/03/15 0425 01/04/15 0412  WBC 4.0 3.7*  HGB 10.5* 10.6*  HCT 33.1* 32.4*  PLT 219 225   CMET CMP     Component Value Date/Time   NA 139 01/04/2015 0412   NA 141 12/10/2014 0847   K 3.7 01/04/2015 0412   K 3.6 12/10/2014 0847   CL 106 01/04/2015 0412   CO2 21* 01/04/2015 0412   CO2 22 12/10/2014 0847   GLUCOSE 100* 01/04/2015 0412   GLUCOSE 153* 12/10/2014 0847   BUN 5* 01/04/2015 0412   BUN 7.4 12/10/2014 0847   CREATININE 0.65 01/04/2015 0412   CREATININE 0.8 12/10/2014 0847   CALCIUM 7.0* 01/04/2015 0412   CALCIUM 8.5 12/10/2014 0847   PROT 7.1 01/04/2015 0412   PROT 7.6 12/10/2014 0847   ALBUMIN 3.3* 01/04/2015 0412   ALBUMIN 3.4* 12/10/2014 0847   AST 98* 01/04/2015 0412   AST 42* 12/10/2014 0847   ALT 71* 01/04/2015 0412   ALT 36 12/10/2014 0847   ALKPHOS 163* 01/04/2015 0412   ALKPHOS 97 12/10/2014 0847   BILITOT 1.3* 01/04/2015 0412   BILITOT 0.51 12/10/2014 0847   GFRNONAA >60 01/04/2015 0412   GFRAA >60 01/04/2015 0412    GFR Estimated Creatinine Clearance: 91.8 mL/min (by C-G formula based on Cr of 0.65).  Recent Labs  01/02/15 2001  LIPASE 22   No results for input(s): CKTOTAL, CKMB, CKMBINDEX, TROPONINI in the last 72 hours. Invalid input(s): POCBNP No results for  input(s): DDIMER in the last 72 hours.  Recent Labs  01/03/15 0425  HGBA1C 6.8*   No results for input(s): CHOL, HDL, LDLCALC, TRIG, CHOLHDL, LDLDIRECT in the last 72 hours.  Recent Labs  01/03/15 0425  TSH 1.228   No results for input(s): VITAMINB12, FOLATE, FERRITIN, TIBC, IRON, RETICCTPCT in the last 72 hours. Coags: No results for input(s): INR in the last 72 hours.  Invalid input(s): PT Microbiology: No results found for this or any previous visit (from the past 240 hour(s)).   BRIEF HOSPITAL COURSE:  Nausea vomiting and diarrhea: ? etiology-likely viral gastroenteritis. Was admitted and provided supportive care, diet was slowly advanced, by day of discharge was able to tolerate regular diet. Since clinically improved without any further nausea/vomiting or diarrhea since admission,  suspect no further inpatient investigations required. Suspect this is viral gastroenteritis and it just took a few more days than usual to get better. Is tolerating diet, and since symptoms have resolved, stable to be discharged home. Abdominal exam is completely benign.   Dehydration: Resolved with IV fluids.   Hypokalemia: Secondary to GI loss. Repleted   Metastatic left breast cancer: Out patient follow-up with oncology. Curbsided primary oncologist-Dr Gudena-currently don't think that this is secondary to any of her antineoplastic agents-as apparently this has not changed over the past few months.   Mildly elevated LFTs: Has history of liver sarcoidosis-probably worsened from viral syndrome. Recheck at primary oncologist's office at her next appointment on 5/23-spoke with Dr. Lindi Adie who will ensure that this happens.    History of sarcoidosis of the liver: Recent outpatient CT scan of the abdomen and ultrasound of the liver this admission shows possible metastatic lesions in the liver. Per Dr.Gudena-lesions in liver are not malignant-but likely secondary to sarcoidosis-apparently they have  been biopsied in the past. We will defer further workup/investigations if needed at all to the outpatient setting   DM II (diabetes mellitus, type II), controlled: A1c 6.8-prior A1c was at 6.5-continue diet and lifestyle modification. Defer initiation of oral hypoglycemic agents to PCP when acute illness has completely resolved.  TODAY-DAY OF DISCHARGE:  Subjective:   Brenda Avery today has no headache,no chest abdominal pain,no new weakness tingling or numbness, feels much better wants to go home today.   Objective:   Blood pressure 125/83, pulse 90, temperature 98.5 F (36.9 C), temperature source Oral, resp. rate 16, height 5\' 4"  (1.626 m), weight 81.8 kg (180 lb 5.4 oz), SpO2 100 %.  Intake/Output Summary (Last 24 hours) at 01/04/15 1049 Last data filed at 01/04/15 0600  Gross per 24 hour  Intake   2740 ml  Output   1300 ml  Net   1440 ml   Filed Weights   01/03/15 0400  Weight: 81.8 kg (180 lb 5.4 oz)    Exam Awake Alert, Oriented *3, No new F.N deficits, Normal affect [place].AT,PERRAL Supple Neck,No JVD, No cervical lymphadenopathy appriciated.  Symmetrical Chest wall movement, Good air movement bilaterally, CTAB RRR,No Gallops,Rubs or new Murmurs, No Parasternal Heave +ve B.Sounds, Abd Soft, Non tender, No organomegaly appriciated, No rebound -guarding or rigidity. No Cyanosis, Clubbing or edema, No new Rash or bruise  DISCHARGE CONDITION: Stable  DISPOSITION: Home  DISCHARGE INSTRUCTIONS:    Activity:  As tolerated  Diet recommendation: Regular Diet  Discharge Instructions    Call MD for:  persistant nausea and vomiting    Complete by:  As directed      Diet general    Complete by:  As directed      Increase activity slowly    Complete by:  As directed            Follow-up Information    Follow up with [name], MD. Schedule an appointment as soon as possible for a visit in 1 week.   Specialty:  Family Medicine   Why:  please ask your doctor  to check your liver enzyme   Contact information:   [address], [place] [address] [phone]       Follow up with Rulon Eisenmenger, MD On 01/07/2015.   Specialty:  Hematology and Oncology   Why:  keep appt on 5/23-please ask that your liver enzyme be checked   Contact information:   [place]  [place] [address] [phone]       Total Time spent on discharge equals 25  Minutes  Signed: Oren Binet 01/04/2015 10:49 AM

## 2015-01-06 NOTE — Assessment & Plan Note (Addendum)
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 Patient is currently on Ibrance, Faslodex, Delton See that started 06/15/14.  Patient experienced the following toxicities of treatment: 1. Severe recurrent neutropenia: After one week of therapy, ANC dropped down to 0.8 and we had to with hold Palbociclib and reduced the dosage to 100 mg daily. ANC 400 on 08/13/2014, Palbociclib dose reduced to 75 mg pills today ANC is 900, on 10/15/2014, Ibrance changed to 75 mg Monday through Friday and be off on weekends 2. Fatigue related to Palbociclib: Slightly improved with a lower dosage of Ibrance 3. UTI: resolved 4. Injection site discomfort related to Faslodex: Encouraged the patient to apply EMLA cream 5. Left hip and low back pain: I encouraged her to do stretching exercises. 6. Bronchitis: Improved after antibiotics 7. Ibranceinduced anemia Grade 1 Continuing to monitor her closely for toxicities.  CT scans and Bone scan 12/03/14: Non PET avid MediastimalLN, Multifocal Liver lesions, ? Sarcoidosis in spleen, Progression of abdominal/ retroperitoneal LN, Sternum sclerotic lesion, Pelvic mets.  Hospitalization 5/18-5/20: Nausea, vomiting, Diarrhea, dehydration  Plan: 1. Sarcoidosis: Involving LN and Liver 2. Continue with Palbociclib to 75 mg Mon-Friday and be off on weekends. 3. Referral to rheumatology for evaluation and treatment of sarcoidosis.

## 2015-01-07 ENCOUNTER — Ambulatory Visit (HOSPITAL_BASED_OUTPATIENT_CLINIC_OR_DEPARTMENT_OTHER): Payer: BLUE CROSS/BLUE SHIELD | Admitting: Hematology and Oncology

## 2015-01-07 ENCOUNTER — Ambulatory Visit (HOSPITAL_BASED_OUTPATIENT_CLINIC_OR_DEPARTMENT_OTHER): Payer: BLUE CROSS/BLUE SHIELD

## 2015-01-07 ENCOUNTER — Other Ambulatory Visit (HOSPITAL_BASED_OUTPATIENT_CLINIC_OR_DEPARTMENT_OTHER): Payer: BLUE CROSS/BLUE SHIELD

## 2015-01-07 ENCOUNTER — Telehealth: Payer: Self-pay | Admitting: Hematology and Oncology

## 2015-01-07 ENCOUNTER — Other Ambulatory Visit: Payer: BLUE CROSS/BLUE SHIELD

## 2015-01-07 VITALS — BP 127/75 | HR 114 | Temp 98.3°F | Resp 18 | Ht 64.0 in | Wt 177.5 lb

## 2015-01-07 DIAGNOSIS — C787 Secondary malignant neoplasm of liver and intrahepatic bile duct: Secondary | ICD-10-CM

## 2015-01-07 DIAGNOSIS — D701 Agranulocytosis secondary to cancer chemotherapy: Secondary | ICD-10-CM

## 2015-01-07 DIAGNOSIS — C50912 Malignant neoplasm of unspecified site of left female breast: Secondary | ICD-10-CM

## 2015-01-07 DIAGNOSIS — C50919 Malignant neoplasm of unspecified site of unspecified female breast: Secondary | ICD-10-CM

## 2015-01-07 DIAGNOSIS — D6481 Anemia due to antineoplastic chemotherapy: Secondary | ICD-10-CM

## 2015-01-07 DIAGNOSIS — C773 Secondary and unspecified malignant neoplasm of axilla and upper limb lymph nodes: Secondary | ICD-10-CM

## 2015-01-07 DIAGNOSIS — C7951 Secondary malignant neoplasm of bone: Secondary | ICD-10-CM

## 2015-01-07 DIAGNOSIS — D8689 Sarcoidosis of other sites: Secondary | ICD-10-CM

## 2015-01-07 DIAGNOSIS — Z170 Estrogen receptor positive status [ER+]: Secondary | ICD-10-CM

## 2015-01-07 DIAGNOSIS — Z5111 Encounter for antineoplastic chemotherapy: Secondary | ICD-10-CM

## 2015-01-07 DIAGNOSIS — D869 Sarcoidosis, unspecified: Secondary | ICD-10-CM

## 2015-01-07 LAB — CBC WITH DIFFERENTIAL/PLATELET
BASO%: 0.7 % (ref 0.0–2.0)
BASOS ABS: 0 10*3/uL (ref 0.0–0.1)
EOS ABS: 0.2 10*3/uL (ref 0.0–0.5)
EOS%: 5.2 % (ref 0.0–7.0)
HCT: 39.3 % (ref 34.8–46.6)
HEMOGLOBIN: 13.3 g/dL (ref 11.6–15.9)
LYMPH%: 16.2 % (ref 14.0–49.7)
MCH: 27.3 pg (ref 25.1–34.0)
MCHC: 33.8 g/dL (ref 31.5–36.0)
MCV: 80.7 fL (ref 79.5–101.0)
MONO#: 0.6 10*3/uL (ref 0.1–0.9)
MONO%: 12.6 % (ref 0.0–14.0)
NEUT#: 2.9 10*3/uL (ref 1.5–6.5)
NEUT%: 65.3 % (ref 38.4–76.8)
PLATELETS: 283 10*3/uL (ref 145–400)
RBC: 4.87 10*6/uL (ref 3.70–5.45)
RDW: 15.7 % — ABNORMAL HIGH (ref 11.2–14.5)
WBC: 4.5 10*3/uL (ref 3.9–10.3)
lymph#: 0.7 10*3/uL — ABNORMAL LOW (ref 0.9–3.3)

## 2015-01-07 LAB — COMPREHENSIVE METABOLIC PANEL (CC13)
ALT: 100 U/L — AB (ref 0–55)
ANION GAP: 15 meq/L — AB (ref 3–11)
AST: 123 U/L — AB (ref 5–34)
Albumin: 3.2 g/dL — ABNORMAL LOW (ref 3.5–5.0)
Alkaline Phosphatase: 241 U/L — ABNORMAL HIGH (ref 40–150)
BUN: 10.3 mg/dL (ref 7.0–26.0)
CHLORIDE: 102 meq/L (ref 98–109)
CO2: 24 mEq/L (ref 22–29)
Calcium: 7.4 mg/dL — ABNORMAL LOW (ref 8.4–10.4)
Creatinine: 0.9 mg/dL (ref 0.6–1.1)
Glucose: 128 mg/dl (ref 70–140)
POTASSIUM: 3.4 meq/L — AB (ref 3.5–5.1)
Sodium: 140 mEq/L (ref 136–145)
Total Bilirubin: 1.29 mg/dL — ABNORMAL HIGH (ref 0.20–1.20)
Total Protein: 8 g/dL (ref 6.4–8.3)

## 2015-01-07 MED ORDER — DENOSUMAB 120 MG/1.7ML ~~LOC~~ SOLN: 120.0000 mg | Freq: Once | SUBCUTANEOUS | Status: DC

## 2015-01-07 MED ORDER — METHYLPREDNISOLONE 4 MG PO TBPK: ORAL_TABLET | ORAL | Status: DC

## 2015-01-07 MED ORDER — FULVESTRANT 250 MG/5ML IM SOLN
500.0000 mg | INTRAMUSCULAR | Status: DC
  Administered 2015-01-07: 500 mg via INTRAMUSCULAR
  Filled 2015-01-07: qty 10

## 2015-01-07 NOTE — Telephone Encounter (Signed)
Gave avs & calendar for May. °

## 2015-01-07 NOTE — Addendum Note (Signed)
Addended by: Prentiss Bells on: 01/07/2015 11:17 AM   Modules accepted: Orders

## 2015-01-07 NOTE — Progress Notes (Signed)
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 She also has suspicious bone lesions but not easily accessible acc.to IR.  Therefore we are giving her the benefit of the doubt and treating her with curative intent. MRI suggests nodal involvement in the axilla.        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 Liver metastases   09/14/2012 Cancer Staging PET scan- multiple hypermetabolic areas left pectoral node the breast mass, left hilar, suprahilar, right hilar, right suprahilar, subcarinal lymph node, liver, right iliac, bilateral inguinal nodes, sternum lytic lesion    09/19/2012 Procedure Liver biopsy- Sarcoidosis   09/26/2012 - 01/10/2013 Chemotherapy AC x 4 followed by weekly Paclitaxel x 12. Marker response supraclavicular and mediastinal nodes decreasing the breast mass and liver lesions resolution of sternal activity   04/10/2013 Surgery Bilateral mastectomies, left decidual IDC grade 2 x 0.7 cm with high-grade DCIS LV I 8/11 lymph nodes positive with extracapsular extension ER 99% PR 40%, HER-2 negative. R: No cancer   04/12/2013 Procedure Genetic testing did not reveal BRCA mutations   05/22/2013 - 06/23/2013 Radiation Therapy Adjuvant radiation therapy with Xeloda   06/23/2013 -  Anti-estrogen oral therapy Tamoxifen 20 mg daily with Zoladex every 3 months   05/28/2014 Relapse/Recurrence Bony metastatic disease: Sternum biopsy metastatic carcinoma, HER-2 negative ratio 0.74   06/15/2014 -  Anti-estrogen oral therapy Ibrance, faslodex, xgeva    CHIEF COMPLIANT: Follow-up after discharge from the hospital  INTERVAL HISTORY: Brenda Avery is a 44 year old with above-mentioned history metastatic breast cancer currently on Ibrance with Faslodex next fever. She was hospitalized for 3 days last week for nausea vomiting diarrhea and dehydration. She was given IV fluids and she had an ultrasound of the liver and was discharged home on Friday. She is here today to recheck her blood levels. She reports that her energy levels are low but she is much better in terms of the nausea and vomiting.  REVIEW OF SYSTEMS:   Constitutional: Denies fevers, chills or abnormal weight loss Eyes: Denies blurriness of vision Ears, nose, mouth, throat, and face: Denies mucositis or sore throat Respiratory: Denies cough, dyspnea or wheezes Cardiovascular: Denies palpitation, chest discomfort or lower extremity swelling Gastrointestinal:  Denies nausea, heartburn or change in bowel habits Skin: Denies abnormal skin rashes Lymphatics: Denies new lymphadenopathy or easy bruising Neurological:Denies numbness, tingling or new weaknesses Behavioral/Psych: Mood is stable, no new changes  Breast:  denies any pain or lumps or nodules in either breasts All other systems were reviewed with the patient and are negative.  I have reviewed the past medical history, past surgical history, social history and family history with the patient and they are unchanged from previous note.  ALLERGIES:  is allergic to metronidazole and sulfonamide derivatives.  MEDICATIONS:  Current Outpatient Prescriptions  Medication Sig Dispense Refill  . fluticasone (FLONASE) 50 MCG/ACT nasal spray Place 1-2 sprays  into both nostrils once as needed for allergies or rhinitis.    Marland Kitchen lidocaine-prilocaine (EMLA) cream Apply 1 application topically as needed. 30 g 0  . ondansetron (ZOFRAN) 4 MG tablet Take 1 tablet (4 mg total) by mouth every 8 (eight) hours as needed for nausea or vomiting. 30 tablet 0  . palbociclib (IBRANCE) 75 MG capsule Take 1 capsule (75 mg total) by mouth daily with breakfast. Take whole with food. (Patient taking differently: Take 75 mg by mouth daily with breakfast. Take whole with food.) 21 capsule 1  . potassium chloride SA (K-DUR,KLOR-CON) 20 MEQ tablet Take 1 tablet (20 mEq total) by mouth 2 (two) times daily. 60 tablet 1   No current facility-administered medications for this visit.    PHYSICAL EXAMINATION: ECOG PERFORMANCE STATUS: 1 - Symptomatic but completely ambulatory  Filed Vitals:   01/07/15 0905  BP: 127/75  Pulse: 114  Temp: 98.3 F (36.8 C)  Resp: 18   Filed Weights   01/07/15 0905  Weight: 177 lb 8 oz (80.513 kg)    GENERAL:alert, no distress and comfortable SKIN: skin color, texture, turgor are normal, no rashes or significant lesions EYES: normal, Conjunctiva are pink and non-injected, sclera clear OROPHARYNX:no exudate, no erythema and lips, buccal mucosa, and tongue normal  NECK: supple, thyroid normal size, non-tender, without nodularity LYMPH:  no palpable lymphadenopathy in the cervical, axillary or inguinal LUNGS: clear to auscultation and percussion with normal breathing effort HEART: regular rate & rhythm and no murmurs and no lower extremity edema ABDOMEN:abdomen soft, non-tender and normal bowel sounds Musculoskeletal:no cyanosis of digits and no clubbing  NEURO: alert & oriented x 3 with fluent speech, no focal motor/sensory deficits  LABORATORY DATA:  I have reviewed the data as listed   Chemistry      Component Value Date/Time   NA 140 01/07/2015 0854   NA 139 01/04/2015 0412   K 3.4* 01/07/2015 0854   K 3.7 01/04/2015 0412   CL  106 01/04/2015 0412   CO2 24 01/07/2015 0854   CO2 21* 01/04/2015 0412   BUN 10.3 01/07/2015 0854   BUN 5* 01/04/2015 0412   CREATININE 0.9 01/07/2015 0854   CREATININE 0.65 01/04/2015 0412      Component Value Date/Time   CALCIUM 7.4* 01/07/2015 0854   CALCIUM 7.0* 01/04/2015 0412   ALKPHOS 241* 01/07/2015 0854   ALKPHOS 163* 01/04/2015 0412   AST 123* 01/07/2015 0854   AST 98* 01/04/2015 0412   ALT 100* 01/07/2015 0854   ALT 71* 01/04/2015 0412   BILITOT 1.29* 01/07/2015 0854   BILITOT 1.3* 01/04/2015 0412       Lab Results  Component Value Date   WBC 4.5 01/07/2015   HGB 13.3 01/07/2015   HCT 39.3 01/07/2015   MCV 80.7 01/07/2015   PLT 283 01/07/2015   NEUTROABS 2.9 01/07/2015     RADIOGRAPHIC STUDIES: I have personally reviewed the radiology reports and agreed with their findings. Ultrasound liver showed lesions which the radiologist thought for liver metastatic disease but I believe that they are related to sarcoidosis.  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 Patient is currently on Ibrance, Faslodex, Delton See that started 06/15/14.  Patient experienced the following toxicities of treatment: 1. Severe recurrent neutropenia: After one week of therapy, ANC dropped down to 0.8 and we had to with hold Palbociclib and reduced the dosage to 100 mg daily. ANC 400 on 08/13/2014, Palbociclib dose reduced to 75 mg pills today ANC is 900, on 10/15/2014, Ibrance changed to 75 mg Monday through Friday and be off on weekends 2. Fatigue related to Palbociclib: Slightly improved with a lower dosage of Ibrance 3. UTI: resolved 4. Injection site discomfort related to Faslodex: Encouraged the patient to apply EMLA cream 5. Left hip and low back pain: I  encouraged her to do stretching exercises. 6. Bronchitis: Improved after antibiotics 7. Ibranceinduced anemia Grade 1 Continuing to monitor her closely for toxicities.  CT scans and Bone scan 12/03/14: Non PET avid MediastimalLN, Multifocal Liver lesions, ? Sarcoidosis in spleen, Progression of abdominal/ retroperitoneal LN, Sternum sclerotic lesion, Pelvic mets.  Hospitalization 5/18-5/20: Nausea, vomiting, Diarrhea, dehydration  Plan: 1. Sarcoidosis: Involving LN and Liver 2. Continue with Palbociclib to 75 mg Mon-Friday and be off on weekends. 3. Referral to rheumatology for evaluation and treatment of sarcoidosis. 4. Elevation of AST and ALT: Worse then when she was in the hospital. We are awaiting rheumatology consultation. In the meantime I will prescribe her Medrol Dosepak. Return to clinic in 1 week to recheck on her liver function tests. Patient will be starting Ibrance from tomorrow. 5. Nausea vomiting diarrhea dehydration improved with fluids still fatigued but improving slowly. Unclear etiology could be viral or could be related to sarcoidosis.  No orders of the defined types were placed in this encounter.   The patient has a good understanding of the overall plan. she agrees with it. she will call with any problems that may develop before the next visit here.   Rulon Eisenmenger, MD @DATE @

## 2015-01-09 ENCOUNTER — Emergency Department (HOSPITAL_COMMUNITY)
Admission: EM | Admit: 2015-01-09 | Discharge: 2015-01-10 | Disposition: A | Discharge status: Home / Self Care | Payer: BLUE CROSS/BLUE SHIELD | Attending: Emergency Medicine | Admitting: Emergency Medicine

## 2015-01-09 ENCOUNTER — Encounter (HOSPITAL_COMMUNITY): Payer: Self-pay

## 2015-01-09 DIAGNOSIS — Z79899 Other long term (current) drug therapy: Secondary | ICD-10-CM | POA: Diagnosis not present

## 2015-01-09 DIAGNOSIS — Z853 Personal history of malignant neoplasm of breast: Secondary | ICD-10-CM | POA: Insufficient documentation

## 2015-01-09 DIAGNOSIS — Z923 Personal history of irradiation: Secondary | ICD-10-CM | POA: Insufficient documentation

## 2015-01-09 DIAGNOSIS — Z79810 Long term (current) use of selective estrogen receptor modulators (SERMs): Secondary | ICD-10-CM | POA: Insufficient documentation

## 2015-01-09 DIAGNOSIS — E860 Dehydration: Secondary | ICD-10-CM | POA: Diagnosis not present

## 2015-01-09 DIAGNOSIS — Z793 Long term (current) use of hormonal contraceptives: Secondary | ICD-10-CM | POA: Diagnosis not present

## 2015-01-09 DIAGNOSIS — R7989 Other specified abnormal findings of blood chemistry: Secondary | ICD-10-CM

## 2015-01-09 DIAGNOSIS — R945 Abnormal results of liver function studies: Secondary | ICD-10-CM

## 2015-01-09 DIAGNOSIS — E119 Type 2 diabetes mellitus without complications: Secondary | ICD-10-CM | POA: Insufficient documentation

## 2015-01-09 DIAGNOSIS — Z8679 Personal history of other diseases of the circulatory system: Secondary | ICD-10-CM | POA: Diagnosis not present

## 2015-01-09 DIAGNOSIS — R109 Unspecified abdominal pain: Secondary | ICD-10-CM | POA: Diagnosis not present

## 2015-01-09 DIAGNOSIS — Z9221 Personal history of antineoplastic chemotherapy: Secondary | ICD-10-CM | POA: Diagnosis not present

## 2015-01-09 DIAGNOSIS — R112 Nausea with vomiting, unspecified: Secondary | ICD-10-CM | POA: Diagnosis present

## 2015-01-09 LAB — CBC WITH DIFFERENTIAL/PLATELET
BASOS ABS: 0 10*3/uL (ref 0.0–0.1)
Basophils Relative: 0 % (ref 0–1)
EOS ABS: 0 10*3/uL (ref 0.0–0.7)
Eosinophils Relative: 1 % (ref 0–5)
HCT: 41.2 % (ref 36.0–46.0)
HEMOGLOBIN: 13.8 g/dL (ref 12.0–15.0)
Lymphocytes Relative: 11 % — ABNORMAL LOW (ref 12–46)
Lymphs Abs: 0.6 10*3/uL — ABNORMAL LOW (ref 0.7–4.0)
MCH: 26.9 pg (ref 26.0–34.0)
MCHC: 33.5 g/dL (ref 30.0–36.0)
MCV: 80.3 fL (ref 78.0–100.0)
MONO ABS: 0.5 10*3/uL (ref 0.1–1.0)
MONOS PCT: 9 % (ref 3–12)
NEUTROS ABS: 4.7 10*3/uL (ref 1.7–7.7)
Neutrophils Relative %: 79 % — ABNORMAL HIGH (ref 43–77)
PLATELETS: 357 10*3/uL (ref 150–400)
RBC: 5.13 MIL/uL — AB (ref 3.87–5.11)
RDW: 15.5 % (ref 11.5–15.5)
WBC: 5.9 10*3/uL (ref 4.0–10.5)

## 2015-01-09 LAB — URINE MICROSCOPIC-ADD ON

## 2015-01-09 LAB — COMPREHENSIVE METABOLIC PANEL
ALT: 105 U/L — ABNORMAL HIGH (ref 14–54)
ANION GAP: 15 (ref 5–15)
AST: 126 U/L — AB (ref 15–41)
Albumin: 3.7 g/dL (ref 3.5–5.0)
Alkaline Phosphatase: 276 U/L — ABNORMAL HIGH (ref 38–126)
BUN: 14 mg/dL (ref 6–20)
CALCIUM: 8.1 mg/dL — AB (ref 8.9–10.3)
CHLORIDE: 96 mmol/L — AB (ref 101–111)
CO2: 26 mmol/L (ref 22–32)
Creatinine, Ser: 0.85 mg/dL (ref 0.44–1.00)
GFR calc Af Amer: >60 mL/min (ref >60)
GLUCOSE: 142 mg/dL — AB (ref 65–99)
POTASSIUM: 3.3 mmol/L — AB (ref 3.5–5.1)
SODIUM: 137 mmol/L (ref 135–145)
Total Bilirubin: 1.4 mg/dL — ABNORMAL HIGH (ref 0.3–1.2)
Total Protein: 8.7 g/dL — ABNORMAL HIGH (ref 6.5–8.1)

## 2015-01-09 LAB — URINALYSIS, ROUTINE W REFLEX MICROSCOPIC
Bilirubin Urine: NEGATIVE
GLUCOSE, UA: NEGATIVE mg/dL
KETONES UR: 40 mg/dL — AB
Nitrite: NEGATIVE
PH: 6 (ref 5.0–8.0)
Protein, ur: NEGATIVE mg/dL
Specific Gravity, Urine: 1.015 (ref 1.005–1.030)
UROBILINOGEN UA: 1 mg/dL (ref 0.0–1.0)

## 2015-01-09 MED ORDER — SODIUM CHLORIDE 0.9 % IV BOLUS (SEPSIS)
1000.0000 mL | Freq: Once | INTRAVENOUS | Status: AC
  Administered 2015-01-09: 1000 mL via INTRAVENOUS

## 2015-01-09 MED ORDER — PROMETHAZINE HCL 25 MG/ML IJ SOLN
25.0000 mg | Freq: Once | INTRAMUSCULAR | Status: AC
  Administered 2015-01-09: 25 mg via INTRAVENOUS
  Filled 2015-01-09: qty 1

## 2015-01-09 NOTE — ED Notes (Signed)
Nurse drawing labs. 

## 2015-01-09 NOTE — ED Notes (Signed)
Pt holding down fluids and crackers without nausea or vomiting. Denies nausea at this time. Ambulatory with steady gait.

## 2015-01-09 NOTE — ED Notes (Addendum)
Pt states admitted recently for dehydration d/t n/v/d.  Today here with n/v since Mon/Tues.  Pt afraid she will get dehydrated again. Pt takes chemo.  Last treatment on Monday.

## 2015-01-10 ENCOUNTER — Telehealth: Payer: Self-pay | Admitting: *Deleted

## 2015-01-10 MED ORDER — PROMETHAZINE HCL 25 MG/ML IJ SOLN
25.0000 mg | Freq: Once | INTRAMUSCULAR | Status: AC
  Administered 2015-01-10: 25 mg via INTRAVENOUS
  Filled 2015-01-10: qty 1

## 2015-01-10 MED ORDER — MORPHINE SULFATE 2 MG/ML IJ SOLN
2.0000 mg | Freq: Once | INTRAMUSCULAR | Status: DC
  Filled 2015-01-10: qty 1

## 2015-01-10 MED ORDER — PROMETHAZINE HCL 25 MG PO TABS: 25.0000 mg | ORAL_TABLET | Freq: Three times a day (TID) | ORAL | Status: DC | PRN

## 2015-01-10 NOTE — Telephone Encounter (Signed)
Received telephone advice record, sent to scan. Patient seen in ED for vomiting.

## 2015-01-10 NOTE — Discharge Instructions (Signed)
Return here as needed.  Follow-up with your oncologist °

## 2015-01-10 NOTE — ED Notes (Signed)
Patient stated morphine is to strong and all she wanted was the phenergan

## 2015-01-11 ENCOUNTER — Telehealth: Payer: Self-pay | Admitting: *Deleted

## 2015-01-11 NOTE — ED Provider Notes (Signed)
CSN: [id number]     Arrival date & time 01/09/15  1807 History   First MD Initiated Contact with Patient 01/09/15 [place]     Chief Complaint  Patient presents with  . Emesis  . Abdominal Pain     (Consider location/radiation/quality/duration/timing/severity/associated sxs/prior Treatment) HPI Patient presents to the emergency department with nausea, vomiting that started today.  The patient states that she has had episodes of nausea and vomiting since she is started cancer treatments.  The patient states that she has been admitted to the hospital recently for dehydration.  The patient was concerned because she started having this vomiting and did not want to get dehydrated.  The patient states that nothing seems make her condition better or worse.  Patient states she is unable to keep any food or drink down.  The patient denies chest pain, shortness breath, headache, blurred vision, , dizziness, back pain, neck pain, fever, cough, runny nose, sore throat, or syncope.  The patient states that her last treatment was Monday.  He has had vomiting since late Monday night into Tuesday and Wednesday Past Medical History  Diagnosis Date  . Hyperlipidemia     diet therapy per patient request  . 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 Hematuria 2000/2001    evaluted in [place] with negative results  . Breast CA     left  . S/P radiation therapy  05/10/2013-06/23/2013    Left chest wall / 50.4 Gray @ 1.8 Gray per fraction x 28 fractions/ Left Supraclavicular fossa / 45 Gray @1 .8 Gray per fraction x 25 fractions/ Left PAB / 45 Gy at 1.8 Gray per fraction x 25 fractions/Left scar / 10 Gray at [employer] per fraction x 5 fractions     . Status post chemotherapy      Radiation Therapy Radiosensitizing Xeloda  . Status post chemotherapy  09/26/2012 - 11/07/2012      Neoadjuvant chemotherapy consisting of 4 cycles of a.c. From 09/26/2012 through 11/07/2012 she then received 12 weeks of paclitaxel.  .  Use of tamoxifen (Nolvadex) started 06/26/13  . Type II diabetes mellitus     diet therapy    Past Surgical History  Procedure Laterality Date  . Rectocele repair  2004  . Cystoscopy  2001  . Nevus excision  2004    facial  . Portacath placement  09/20/2012    Procedure: INSERTION PORT-A-CATH;  Surgeon: Adin Hector, MD;  Location: [facility];  Service: General;  Laterality: N/A;  port a cath insertion with flouro and ultrasound   . Mastectomy Right 04/10/2013  . Mastectomy modified radical Left 04/10/2013  . Axillary sentinel node biopsy Right 04/10/2013  . Liver biopsy    . Breast biopsy Left 2014  . Tubal ligation  2004  . Mastectomy modified radical Left 04/10/2013    Procedure: LEFT MODIFIED RADICAL MASTECTOMY ;  Surgeon: Adin Hector, MD;  Location: [facility];  Service: General;  Laterality: Left;  . Simple mastectomy with axillary sentinel node biopsy Right 04/10/2013    Procedure: RIGHT TOTAL  MASTECTOMY WITH AXILLARY SENTINEL NODE BIOPSY;  Surgeon: Adin Hector, MD;  Location: [facility];  Service: General;  Laterality: Right;  Methylene Blue Injection   Family History  Problem Relation Age of Onset  . Hypertension Mother   . Diabetes Mother     MI  . Heart disease Mother   . Hypertension Sister     x4 only one HTN  and one thats DI  . Hypertension Brother     x4 only 1 htn & dm  . Heart disease Father   . Breast cancer Maternal Aunt     diagnosed in her 23s  . Cancer Maternal Aunt     unknown  . Breast cancer Paternal Aunt     diagnosed in her 10s  . Cervical cancer Sister 38  . Breast cancer Maternal Aunt     diagnosed in her 75s  . Breast cancer Cousin     3 maternal cousins - 2 in their 39s, 1 in her 48s   History  Substance Use Topics  . Smoking status: Never Smoker   . Smokeless tobacco: Never Used  . Alcohol Use: No   OB History    No data available     Review of Systems All other systems negative except as documented in the HPI. All pertinent positives  and negatives as reviewed in the HPI.  Allergies  Metronidazole; Morphine and related; and Sulfonamide derivatives  Home Medications   Prior to Admission medications   Medication Sig Start Date End Date Taking? Authorizing Provider  methylPREDNISolone (MEDROL DOSEPAK) 4 MG TBPK tablet Take by mouth as directed. Take as directed 01/07/15  Yes Nicholas Lose, MD  ondansetron (ZOFRAN) 4 MG tablet Take 1 tablet (4 mg total) by mouth every 8 (eight) hours as needed for nausea or vomiting. 01/04/15  Yes Shanker Kristeen Mans, MD  palbociclib [facility]) 75 MG capsule Take 1 capsule (75 mg total) by mouth daily with breakfast. Take whole with food. Patient taking differently: Take 75 mg by mouth daily with breakfast. Take whole with food. 11/20/14  Yes Nicholas Lose, MD  potassium chloride SA (K-DUR,KLOR-CON) 20 MEQ tablet Take 1 tablet (20 mEq total) by mouth 2 (two) times daily. 07/04/14  Yes Nicholas Lose, MD  [place]   Yes Historical Provider, MD  lidocaine-prilocaine (EMLA) cream Apply 1 application topically as needed. 06/22/14   Laurie Panda, NP  promethazine (PHENERGAN) 25 MG tablet Take 1 tablet (25 mg total) by mouth every 8 (eight) hours as needed for nausea or vomiting. 01/10/15   Dalia Heading, PA-C   BP 122/77 mmHg  Pulse 75  Temp(Src) 98.1 F (36.7 C) (Oral)  Resp 14  SpO2 99% Physical Exam  Constitutional: She is oriented to person, place, and time. She appears well-developed and well-nourished. No distress.  HENT:  Head: Normocephalic and atraumatic.  Mouth/Throat: Oropharynx is clear and moist.  Eyes: Pupils are equal, round, and reactive to light.  Neck: Normal range of motion. Neck supple.  Cardiovascular: Normal rate, regular rhythm and normal heart sounds.  Exam reveals no gallop and no friction rub.   No murmur heard. Pulmonary/Chest: Effort normal and breath sounds normal. No respiratory distress.  Abdominal: Soft. Bowel sounds are  normal. She exhibits no distension. There is no tenderness.  Musculoskeletal: She exhibits no edema.  Neurological: She is alert and oriented to person, place, and time. She exhibits normal muscle tone. Coordination normal.  Skin: Skin is warm and dry. No rash noted. No erythema.  Psychiatric: She has a normal mood and affect. Her behavior is normal.  Nursing note and vitals reviewed.   ED Course  Procedures (including critical care time) Labs Review Labs Reviewed  CBC WITH DIFFERENTIAL/PLATELET - Abnormal; Notable for the following:    RBC 5.13 (*)    Neutrophils Relative % 79 (*)    Lymphocytes Relative 11 (*)  Lymphs Abs 0.6 (*)    All other components within normal limits  COMPREHENSIVE METABOLIC PANEL - Abnormal; Notable for the following:    Potassium 3.3 (*)    Chloride 96 (*)    Glucose, Bld 142 (*)    Calcium 8.1 (*)    Total Protein 8.7 (*)    AST 126 (*)    ALT 105 (*)    Alkaline Phosphatase 276 (*)    Total Bilirubin 1.4 (*)    All other components within normal limits  URINALYSIS, ROUTINE W REFLEX MICROSCOPIC (NOT AT [facility]) - Abnormal; Notable for the following:    APPearance CLOUDY (*)    Hgb urine dipstick LARGE (*)    Ketones, ur 40 (*)    Leukocytes, UA SMALL (*)    All other components within normal limits  URINE MICROSCOPIC-ADD ON - Abnormal; Notable for the following:    Bacteria, UA FEW (*)    Casts HYALINE CASTS (*)    All other components within normal limits    Patient is given 2 L of IV fluids she has been monitored in the emergency department.  She is also tolerated crackers and fluids.  The patient states that she feels better at this time.  She will be discharged home and told to follow up with her primary care doctor and her oncologist told to return here as needed    Dalia Heading, PA-C 01/11/15 [place], MD 01/14/15 [phone]

## 2015-01-11 NOTE — Telephone Encounter (Signed)
Call received asking if the "phenergan 25 mg tablets can be split in half because she is so sleepy.  Vomited once this morning and once yesterday.  I've been having n/v for 2 to 3 weeks now.  Taking the phenergan helps me eat.  I haven't eaten today." Phenergan is scored and can be split.  Admits she is drinking 64 oz each day and drinking ensure.  Also has zofran on hand.  Denies need for IVF or visit but this nurse advised to try zofran too and to call back early today if change in symptoms.

## 2015-01-12 ENCOUNTER — Emergency Department (HOSPITAL_COMMUNITY)
Admission: EM | Admit: 2015-01-12 | Discharge: 2015-01-12 | Disposition: A | Discharge status: Home / Self Care | Payer: BLUE CROSS/BLUE SHIELD | Attending: Emergency Medicine | Admitting: Emergency Medicine

## 2015-01-12 ENCOUNTER — Encounter (HOSPITAL_COMMUNITY): Payer: Self-pay | Admitting: Emergency Medicine

## 2015-01-12 ENCOUNTER — Emergency Department (HOSPITAL_COMMUNITY): Payer: BLUE CROSS/BLUE SHIELD

## 2015-01-12 DIAGNOSIS — E119 Type 2 diabetes mellitus without complications: Secondary | ICD-10-CM | POA: Diagnosis not present

## 2015-01-12 DIAGNOSIS — Z853 Personal history of malignant neoplasm of breast: Secondary | ICD-10-CM | POA: Insufficient documentation

## 2015-01-12 DIAGNOSIS — Z79899 Other long term (current) drug therapy: Secondary | ICD-10-CM | POA: Insufficient documentation

## 2015-01-12 DIAGNOSIS — N390 Urinary tract infection, site not specified: Secondary | ICD-10-CM

## 2015-01-12 DIAGNOSIS — M545 Low back pain: Secondary | ICD-10-CM | POA: Diagnosis present

## 2015-01-12 LAB — CBC WITH DIFFERENTIAL/PLATELET
Basophils Absolute: 0 10*3/uL (ref 0.0–0.1)
Basophils Relative: 1 % (ref 0–1)
Eosinophils Absolute: 0.1 10*3/uL (ref 0.0–0.7)
Eosinophils Relative: 3 % (ref 0–5)
HCT: 41.3 % (ref 36.0–46.0)
Hemoglobin: 14.1 g/dL (ref 12.0–15.0)
Lymphocytes Relative: 17 % (ref 12–46)
Lymphs Abs: 0.8 10*3/uL (ref 0.7–4.0)
MCH: 27.4 pg (ref 26.0–34.0)
MCHC: 34.1 g/dL (ref 30.0–36.0)
MCV: 80.4 fL (ref 78.0–100.0)
MONOS PCT: 8 % (ref 3–12)
Monocytes Absolute: 0.4 10*3/uL (ref 0.1–1.0)
NEUTROS PCT: 71 % (ref 43–77)
Neutro Abs: 3.4 10*3/uL (ref 1.7–7.7)
Platelets: 317 10*3/uL (ref 150–400)
RBC: 5.14 MIL/uL — AB (ref 3.87–5.11)
RDW: 15.3 % (ref 11.5–15.5)
WBC: 4.7 10*3/uL (ref 4.0–10.5)

## 2015-01-12 LAB — COMPREHENSIVE METABOLIC PANEL
ALBUMIN: 3.4 g/dL — AB (ref 3.5–5.0)
ALT: 100 U/L — ABNORMAL HIGH (ref 14–54)
AST: 131 U/L — ABNORMAL HIGH (ref 15–41)
Alkaline Phosphatase: 256 U/L — ABNORMAL HIGH (ref 38–126)
Anion gap: 13 (ref 5–15)
BUN: 12 mg/dL (ref 6–20)
CHLORIDE: 95 mmol/L — AB (ref 101–111)
CO2: 27 mmol/L (ref 22–32)
Calcium: 7.5 mg/dL — ABNORMAL LOW (ref 8.9–10.3)
Creatinine, Ser: 0.78 mg/dL (ref 0.44–1.00)
GFR calc Af Amer: >60 mL/min (ref >60)
Glucose, Bld: 139 mg/dL — ABNORMAL HIGH (ref 65–99)
POTASSIUM: 3.3 mmol/L — AB (ref 3.5–5.1)
Sodium: 135 mmol/L (ref 135–145)
Total Bilirubin: 1.3 mg/dL — ABNORMAL HIGH (ref 0.3–1.2)
Total Protein: 8 g/dL (ref 6.5–8.1)

## 2015-01-12 LAB — URINALYSIS, ROUTINE W REFLEX MICROSCOPIC
Bilirubin Urine: NEGATIVE
Glucose, UA: NEGATIVE mg/dL
Ketones, ur: NEGATIVE mg/dL
NITRITE: NEGATIVE
PROTEIN: NEGATIVE mg/dL
SPECIFIC GRAVITY, URINE: 1.021 (ref 1.005–1.030)
Urobilinogen, UA: 4 mg/dL — ABNORMAL HIGH (ref 0.0–1.0)
pH: 6 (ref 5.0–8.0)

## 2015-01-12 LAB — URINE MICROSCOPIC-ADD ON

## 2015-01-12 MED ORDER — CEPHALEXIN 500 MG PO CAPS
500.0000 mg | ORAL_CAPSULE | Freq: Once | ORAL | Status: AC
  Administered 2015-01-12: 500 mg via ORAL
  Filled 2015-01-12: qty 1

## 2015-01-12 MED ORDER — ONDANSETRON HCL 4 MG/2ML IJ SOLN
4.0000 mg | Freq: Once | INTRAMUSCULAR | Status: AC
  Administered 2015-01-12: 4 mg via INTRAVENOUS
  Filled 2015-01-12: qty 2

## 2015-01-12 MED ORDER — SODIUM CHLORIDE 0.9 % IV SOLN
1000.0000 mL | Freq: Once | INTRAVENOUS | Status: AC
  Administered 2015-01-12: 1000 mL via INTRAVENOUS

## 2015-01-12 MED ORDER — OXYCODONE-ACETAMINOPHEN 5-325 MG PO TABS: 1.0000 | ORAL_TABLET | Freq: Four times a day (QID) | ORAL | Status: DC | PRN

## 2015-01-12 MED ORDER — HYDROMORPHONE HCL 1 MG/ML IJ SOLN
0.5000 mg | INTRAMUSCULAR | Status: DC | PRN
  Administered 2015-01-12 (×2): 0.5 mg via INTRAVENOUS
  Filled 2015-01-12 (×2): qty 1

## 2015-01-12 MED ORDER — SODIUM CHLORIDE 0.9 % IV SOLN
1000.0000 mL | INTRAVENOUS | Status: DC
  Administered 2015-01-12: 1000 mL via INTRAVENOUS

## 2015-01-12 MED ORDER — CEPHALEXIN 500 MG PO CAPS: 500.0000 mg | ORAL_CAPSULE | Freq: Two times a day (BID) | ORAL | Status: DC

## 2015-01-12 NOTE — ED Notes (Signed)
Pt request crangrape juice; MD reports okay; pt given 8 ounces of crangrape juice per request.

## 2015-01-12 NOTE — Discharge Instructions (Signed)

## 2015-01-12 NOTE — ED Notes (Addendum)
Pt with Hx of L sided breast cancer c/o back pain "that moves" x 1 weeks, as well as N/V and "dehydration."  Pt is taking a chemo pill and had a chemo treatment on Monday. Pt A&Ox4. Pt not very forthcoming with information.

## 2015-01-12 NOTE — ED Notes (Signed)
Pt in DG.

## 2015-01-12 NOTE — ED Provider Notes (Signed)
CSN: [id number]     Arrival date & time 01/12/15  1517 History   First MD Initiated Contact with Patient 01/12/15 1531     Chief Complaint  Patient presents with  . Cancer  . Back Pain  . Emesis    Patient is a 44 y.o. female presenting with back pain and vomiting.  Back Pain Associated symptoms: no dysuria and no fever   Emesis Associated symptoms: no diarrhea    Pt has been having aching in her lower back.  It has moved from the left side to the right.  It started about 1 week ago.  She called her doctor and they called in tramadol.  She has not had trouble in the past though.  NO radiation into the legs.  No urinary symptoms. She had chemotherapy injections in her lower back and that seemed to make it worse.  No fevers.  She also has been having trouble with nausea and vomiting and decreased oral intake since her chemotherapy meds.  Pt feels like she is dehydrated. Past Medical History  Diagnosis Date  . Hyperlipidemia     diet therapy per patient request  . 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 Hematuria 2000/2001    evaluted in [place] with negative results  . Breast CA     left  . S/P radiation therapy  05/10/2013-06/23/2013    Left chest wall / 50.4 Gray @ 1.8 Gray per fraction x 28 fractions/ Left Supraclavicular fossa / 45 Gray @1 .8 Gray per fraction x 25 fractions/ Left PAB / 45 Gy at 1.8 Gray per fraction x 25 fractions/Left scar / 10 Gray at [employer] per fraction x 5 fractions     . Status post chemotherapy      Radiation Therapy Radiosensitizing Xeloda  . Status post chemotherapy  09/26/2012 - 11/07/2012      Neoadjuvant chemotherapy consisting of 4 cycles of a.c. From 09/26/2012 through 11/07/2012 she then received 12 weeks of paclitaxel.  . Use of tamoxifen (Nolvadex) started 06/26/13  . Type II diabetes mellitus     diet therapy    Past Surgical History  Procedure Laterality Date  . Rectocele repair  2004  . Cystoscopy  2001  . Nevus excision  2004     facial  . Portacath placement  09/20/2012    Procedure: INSERTION PORT-A-CATH;  Surgeon: Adin Hector, MD;  Location: [place];  Service: General;  Laterality: N/A;  port a cath insertion with flouro and ultrasound   . Mastectomy Right 04/10/2013  . Mastectomy modified radical Left 04/10/2013  . Axillary sentinel node biopsy Right 04/10/2013  . Liver biopsy    . Breast biopsy Left 2014  . Tubal ligation  2004  . Mastectomy modified radical Left 04/10/2013    Procedure: LEFT MODIFIED RADICAL MASTECTOMY ;  Surgeon: Adin Hector, MD;  Location: [place];  Service: General;  Laterality: Left;  . Simple mastectomy with axillary sentinel node biopsy Right 04/10/2013    Procedure: RIGHT TOTAL  MASTECTOMY WITH AXILLARY SENTINEL NODE BIOPSY;  Surgeon: Adin Hector, MD;  Location: [place];  Service: General;  Laterality: Right;  Methylene Blue Injection   Family History  Problem Relation Age of Onset  . Hypertension Mother   . Diabetes Mother     MI  . Heart disease Mother   . Hypertension Sister     x4 only one HTN and one thats DI  . Hypertension Brother  x4 only 1 htn & dm  . Heart disease Father   . Breast cancer Maternal Aunt     diagnosed in her 25s  . Cancer Maternal Aunt     unknown  . Breast cancer Paternal Aunt     diagnosed in her 74s  . Cervical cancer Sister 30  . Breast cancer Maternal Aunt     diagnosed in her 20s  . Breast cancer Cousin     3 maternal cousins - 2 in their 12s, 1 in her 80s   History  Substance Use Topics  . Smoking status: Never Smoker   . Smokeless tobacco: Never Used  . Alcohol Use: No   OB History    No data available     Review of Systems  Constitutional: Negative for fever.  HENT: Negative for nosebleeds and rhinorrhea.   Respiratory: Negative for shortness of breath.   Gastrointestinal: Positive for nausea and vomiting. Negative for diarrhea.  Genitourinary: Negative for dysuria.  Musculoskeletal: Positive for back pain. Negative  for joint swelling.  All other systems reviewed and are negative.     Allergies  Metronidazole; Morphine and related; and Sulfonamide derivatives  Home Medications   Prior to Admission medications   Medication Sig Start Date End Date Taking? Authorizing Provider  lidocaine-prilocaine (EMLA) cream Apply 1 application topically as needed. 06/22/14  Yes Laurie Panda, NP  ondansetron (ZOFRAN) 4 MG tablet Take 1 tablet (4 mg total) by mouth every 8 (eight) hours as needed for nausea or vomiting. 01/04/15  Yes Shanker Kristeen Mans, MD  palbociclib [facility]) 75 MG capsule Take 1 capsule (75 mg total) by mouth daily with breakfast. Take whole with food. Patient taking differently: Take 75 mg by mouth daily with breakfast. Take whole with food. 11/20/14  Yes Nicholas Lose, MD  potassium chloride SA (K-DUR,KLOR-CON) 20 MEQ tablet Take 1 tablet (20 mEq total) by mouth 2 (two) times daily. 07/04/14  Yes Nicholas Lose, MD  promethazine (PHENERGAN) 25 MG tablet Take 1 tablet (25 mg total) by mouth every 8 (eight) hours as needed for nausea or vomiting. 01/10/15  Yes Dalia Heading, PA-C  cephALEXin (KEFLEX) 500 MG capsule Take 1 capsule (500 mg total) by mouth 2 (two) times daily. 01/12/15   Dorie Rank, MD  methylPREDNISolone (MEDROL DOSEPAK) 4 MG TBPK tablet Take by mouth as directed. Take as directed Patient not taking: Reported on 01/12/2015 01/07/15   Nicholas Lose, MD  oxyCODONE-acetaminophen (PERCOCET/ROXICET) 5-325 MG per tablet Take 1-2 tablets by mouth every 6 (six) hours as needed. 01/12/15   Dorie Rank, MD   BP 121/76 mmHg  Pulse 77  Temp(Src) 97.8 F (36.6 C) (Oral)  Resp 16  SpO2 99% Physical Exam  Constitutional: She appears well-developed and well-nourished. No distress.  HENT:  Head: Normocephalic and atraumatic.  Right Ear: External ear normal.  Left Ear: External ear normal.  Eyes: Conjunctivae are normal. Right eye exhibits no discharge. Left eye exhibits no discharge. No scleral  icterus.  Neck: Neck supple. No tracheal deviation present.  Cardiovascular: Normal rate, regular rhythm and intact distal pulses.   Pulmonary/Chest: Effort normal and breath sounds normal. No stridor. No respiratory distress. She has no wheezes. She has no rales.  Abdominal: Soft. Bowel sounds are normal. She exhibits no distension. There is no tenderness. There is no rebound and no guarding.  Musculoskeletal: She exhibits no edema or tenderness.  Neurological: She is alert. She has normal strength. No cranial nerve deficit (no facial droop, extraocular  movements intact, no slurred speech) or sensory deficit. She exhibits normal muscle tone. She displays no seizure activity. Coordination normal.  Skin: Skin is warm and dry. No rash noted.  Psychiatric: She has a normal mood and affect.  Nursing note and vitals reviewed.   ED Course  Procedures (including critical care time) Labs Review Labs Reviewed  CBC WITH DIFFERENTIAL/PLATELET - Abnormal; Notable for the following:    RBC 5.14 (*)    All other components within normal limits  COMPREHENSIVE METABOLIC PANEL - Abnormal; Notable for the following:    Potassium 3.3 (*)    Chloride 95 (*)    Glucose, Bld 139 (*)    Calcium 7.5 (*)    Albumin 3.4 (*)    AST 131 (*)    ALT 100 (*)    Alkaline Phosphatase 256 (*)    Total Bilirubin 1.3 (*)    All other components within normal limits  URINALYSIS, ROUTINE W REFLEX MICROSCOPIC (NOT AT [facility]) - Abnormal; Notable for the following:    Color, Urine AMBER (*)    APPearance CLOUDY (*)    Hgb urine dipstick LARGE (*)    Urobilinogen, UA 4.0 (*)    Leukocytes, UA MODERATE (*)    All other components within normal limits  URINE MICROSCOPIC-ADD ON - Abnormal; Notable for the following:    Squamous Epithelial / LPF MANY (*)    Bacteria, UA MANY (*)    All other components within normal limits    Imaging Review Dg Lumbar Spine Complete  01/12/2015   CLINICAL DATA:  Low back pain starting  today, no trauma, history of breast cancer  EXAM: LUMBAR SPINE - COMPLETE 4+ VIEW  COMPARISON:  Sagittal view of the lumbar spine 12/03/2014.  FINDINGS: There is no evidence of lumbar spine fracture. Alignment is normal. Intervertebral disc spaces are maintained.  IMPRESSION: Negative.   Electronically Signed   By: Lahoma Crocker M.D.   On: 01/12/2015 16:39   Medications  0.9 %  sodium chloride infusion (0 mLs Intravenous Stopped 01/12/15 1706)    Followed by  0.9 %  sodium chloride infusion (1,000 mLs Intravenous New Bag/Given 01/12/15 1738)  HYDROmorphone (DILAUDID) injection 0.5 mg (0.5 mg Intravenous Given 01/12/15 1738)  cephALEXin (KEFLEX) capsule 500 mg (not administered)  ondansetron (ZOFRAN) injection 4 mg (4 mg Intravenous Given 01/12/15 1603)     MDM   Final diagnoses:  UTI (lower urinary tract infection)  Low back pain without sciatica, unspecified back pain laterality    Pain improved with treatment in the ED.  UA shows possible uti although there is squamous cell contamination.  Pt is feeling better after medications.  Pain may be related to uti, chemo, and or musculoskeletal.  Doubt ureteral colic.    Reviewed prior imaging tests.  She had a CT scan in April that showed findings associated with her cancer.    No kidney stones previously.  Apparently showed some findings in the pelvic region on a prior pet scan but that was not noted on the CT scan.  At this time there does not appear to be any evidence of an acute emergency medical condition and the patient appears stable for discharge with appropriate outpatient follow up.     Dorie Rank, MD 01/12/15 [phone]

## 2015-01-12 NOTE — ED Notes (Signed)
Pt denies urge to void and refuses in and out catheter at present time; pt agrees to attempt to provide urine sample within next 15 minutes.

## 2015-01-14 NOTE — Assessment & Plan Note (Signed)
Metastatic breast cancer with bone metastases, lung, liver metastases, spleen lesions, and pleural irregularity ER 100%, PR 3%, HER-2 negative. Patient is currently on Ibrance, Faslodex, Delton See that started 06/15/14.  Patient experienced the following toxicities of treatment: 1. Severe recurrent neutropenia: After one week of therapy, ANC dropped down to 0.8 and we had to with hold Palbociclib and reduced the dosage to 100 mg daily. ANC 400 on 08/13/2014, Palbociclib dose reduced to 75 mg pills today ANC is 900, on 10/15/2014, Ibrance changed to 75 mg Monday through Friday and be off on weekends 2. Fatigue related to Palbociclib: Slightly improved with a lower dosage of Ibrance 3. UTI: resolved 4. Injection site discomfort related to Faslodex: Encouraged the patient to apply EMLA cream 5. Left hip and low back pain: I encouraged her to do stretching exercises. 6. Bronchitis: Improved after antibiotics 7.Ibranceinduced anemia Grade 1 Continuing to monitor her closely for toxicities.  CT scans and Bone scan 12/03/14: Non PET avid MediastimalLN, Multifocal Liver lesions, ? Sarcoidosis in spleen, Progression of abdominal/ retroperitoneal LN, Sternum sclerotic lesion, Pelvic mets.  Hospitalization 5/18-5/20: Nausea, vomiting, Diarrhea, dehydration  Plan: 1. Sarcoidosis: Involving LN and Liver 2. Continue with Palbociclib to 75 mg Mon-Friday and be off on weekends. 3. Referral to rheumatology for evaluation and treatment of sarcoidosis. 4. Elevation of AST and ALT: Worse then when she was in the hospital. We are awaiting rheumatology consultation. In the meantime I will prescribe her Medrol Dosepak. Return to clinic in 1 week to recheck on her liver function tests. Patient will be starting Ibrance from tomorrow. 5. Nausea vomiting diarrhea dehydration improved with fluids still fatigued but improving slowly. Unclear etiology could be viral or could be related to sarcoidosis.

## 2015-01-15 ENCOUNTER — Other Ambulatory Visit (HOSPITAL_BASED_OUTPATIENT_CLINIC_OR_DEPARTMENT_OTHER): Payer: BLUE CROSS/BLUE SHIELD

## 2015-01-15 ENCOUNTER — Telehealth: Payer: Self-pay | Admitting: Hematology and Oncology

## 2015-01-15 ENCOUNTER — Ambulatory Visit (HOSPITAL_BASED_OUTPATIENT_CLINIC_OR_DEPARTMENT_OTHER): Payer: BLUE CROSS/BLUE SHIELD | Admitting: Hematology and Oncology

## 2015-01-15 ENCOUNTER — Ambulatory Visit (HOSPITAL_COMMUNITY)
Admission: RE | Admit: 2015-01-15 | Discharge: 2015-01-15 | Disposition: A | Discharge status: Home / Self Care | Payer: BLUE CROSS/BLUE SHIELD | Source: Ambulatory Visit | Attending: Hematology and Oncology | Admitting: Hematology and Oncology

## 2015-01-15 ENCOUNTER — Encounter (HOSPITAL_COMMUNITY): Payer: Self-pay | Admitting: *Deleted

## 2015-01-15 ENCOUNTER — Telehealth: Payer: Self-pay

## 2015-01-15 ENCOUNTER — Inpatient Hospital Stay (HOSPITAL_COMMUNITY)
Admission: AD | Admit: 2015-01-15 | Discharge: 2015-01-17 | DRG: 948 | Disposition: A | Discharge status: Home / Self Care | Payer: BLUE CROSS/BLUE SHIELD | Source: Ambulatory Visit | Attending: Internal Medicine | Admitting: Internal Medicine

## 2015-01-15 ENCOUNTER — Inpatient Hospital Stay (HOSPITAL_COMMUNITY): Payer: BLUE CROSS/BLUE SHIELD

## 2015-01-15 VITALS — BP 107/67 | HR 125 | Temp 99.3°F | Resp 19

## 2015-01-15 DIAGNOSIS — E860 Dehydration: Secondary | ICD-10-CM | POA: Diagnosis present

## 2015-01-15 DIAGNOSIS — D8689 Sarcoidosis of other sites: Secondary | ICD-10-CM | POA: Diagnosis not present

## 2015-01-15 DIAGNOSIS — C799 Secondary malignant neoplasm of unspecified site: Secondary | ICD-10-CM | POA: Diagnosis not present

## 2015-01-15 DIAGNOSIS — R945 Abnormal results of liver function studies: Secondary | ICD-10-CM

## 2015-01-15 DIAGNOSIS — E876 Hypokalemia: Secondary | ICD-10-CM | POA: Diagnosis not present

## 2015-01-15 DIAGNOSIS — Z9013 Acquired absence of bilateral breasts and nipples: Secondary | ICD-10-CM | POA: Diagnosis present

## 2015-01-15 DIAGNOSIS — Z9221 Personal history of antineoplastic chemotherapy: Secondary | ICD-10-CM

## 2015-01-15 DIAGNOSIS — D869 Sarcoidosis, unspecified: Secondary | ICD-10-CM | POA: Diagnosis present

## 2015-01-15 DIAGNOSIS — R1110 Vomiting, unspecified: Secondary | ICD-10-CM | POA: Diagnosis not present

## 2015-01-15 DIAGNOSIS — Z882 Allergy status to sulfonamides status: Secondary | ICD-10-CM

## 2015-01-15 DIAGNOSIS — C787 Secondary malignant neoplasm of liver and intrahepatic bile duct: Secondary | ICD-10-CM

## 2015-01-15 DIAGNOSIS — R112 Nausea with vomiting, unspecified: Secondary | ICD-10-CM | POA: Diagnosis present

## 2015-01-15 DIAGNOSIS — E440 Moderate protein-calorie malnutrition: Secondary | ICD-10-CM | POA: Diagnosis present

## 2015-01-15 DIAGNOSIS — C7951 Secondary malignant neoplasm of bone: Secondary | ICD-10-CM

## 2015-01-15 DIAGNOSIS — C50912 Malignant neoplasm of unspecified site of left female breast: Secondary | ICD-10-CM | POA: Diagnosis present

## 2015-01-15 DIAGNOSIS — C7800 Secondary malignant neoplasm of unspecified lung: Secondary | ICD-10-CM

## 2015-01-15 DIAGNOSIS — D649 Anemia, unspecified: Secondary | ICD-10-CM | POA: Diagnosis present

## 2015-01-15 DIAGNOSIS — R7989 Other specified abnormal findings of blood chemistry: Secondary | ICD-10-CM | POA: Diagnosis present

## 2015-01-15 DIAGNOSIS — E785 Hyperlipidemia, unspecified: Secondary | ICD-10-CM | POA: Diagnosis present

## 2015-01-15 DIAGNOSIS — R748 Abnormal levels of other serum enzymes: Secondary | ICD-10-CM

## 2015-01-15 DIAGNOSIS — Z170 Estrogen receptor positive status [ER+]: Secondary | ICD-10-CM

## 2015-01-15 DIAGNOSIS — E119 Type 2 diabetes mellitus without complications: Secondary | ICD-10-CM | POA: Diagnosis present

## 2015-01-15 DIAGNOSIS — Z885 Allergy status to narcotic agent status: Secondary | ICD-10-CM | POA: Diagnosis not present

## 2015-01-15 DIAGNOSIS — Z6829 Body mass index (BMI) 29.0-29.9, adult: Secondary | ICD-10-CM | POA: Diagnosis not present

## 2015-01-15 DIAGNOSIS — Z923 Personal history of irradiation: Secondary | ICD-10-CM | POA: Diagnosis not present

## 2015-01-15 LAB — COMPREHENSIVE METABOLIC PANEL (CC13)
ALT: 119 U/L — ABNORMAL HIGH (ref 0–55)
AST: 188 U/L — AB (ref 5–34)
Albumin: 2.9 g/dL — ABNORMAL LOW (ref 3.5–5.0)
Alkaline Phosphatase: 311 U/L — ABNORMAL HIGH (ref 40–150)
Anion Gap: 13 mEq/L — ABNORMAL HIGH (ref 3–11)
BILIRUBIN TOTAL: 2.04 mg/dL — AB (ref 0.20–1.20)
BUN: 11.5 mg/dL (ref 7.0–26.0)
CO2: 25 mEq/L (ref 22–29)
CREATININE: 0.8 mg/dL (ref 0.6–1.1)
Calcium: 7.7 mg/dL — ABNORMAL LOW (ref 8.4–10.4)
Chloride: 97 mEq/L — ABNORMAL LOW (ref 98–109)
EGFR: >90 mL/min/{1.73_m2} (ref >90)
Glucose: 137 mg/dl (ref 70–140)
Potassium: 3.4 mEq/L — ABNORMAL LOW (ref 3.5–5.1)
Sodium: 135 mEq/L — ABNORMAL LOW (ref 136–145)
Total Protein: 7.7 g/dL (ref 6.4–8.3)

## 2015-01-15 LAB — CBC WITH DIFFERENTIAL/PLATELET
BASO%: 1 % (ref 0.0–2.0)
BASOS ABS: 0.1 10*3/uL (ref 0.0–0.1)
EOS%: 2.1 % (ref 0.0–7.0)
Eosinophils Absolute: 0.1 10*3/uL (ref 0.0–0.5)
HEMATOCRIT: 42.3 % (ref 34.8–46.6)
HEMOGLOBIN: 13.9 g/dL (ref 11.6–15.9)
LYMPH%: 9.7 % — ABNORMAL LOW (ref 14.0–49.7)
MCH: 26.8 pg (ref 25.1–34.0)
MCHC: 33 g/dL (ref 31.5–36.0)
MCV: 81.4 fL (ref 79.5–101.0)
MONO#: 0.3 10*3/uL (ref 0.1–0.9)
MONO%: 6.1 % (ref 0.0–14.0)
NEUT#: 4.2 10*3/uL (ref 1.5–6.5)
NEUT%: 81.1 % — ABNORMAL HIGH (ref 38.4–76.8)
PLATELETS: 352 10*3/uL (ref 145–400)
RBC: 5.19 10*6/uL (ref 3.70–5.45)
RDW: 16.1 % — AB (ref 11.2–14.5)
WBC: 5.2 10*3/uL (ref 3.9–10.3)
lymph#: 0.5 10*3/uL — ABNORMAL LOW (ref 0.9–3.3)

## 2015-01-15 LAB — PROTIME-INR
INR: 1.28 (ref 0.00–1.49)
Prothrombin Time: 16.2 seconds — ABNORMAL HIGH (ref 11.6–15.2)

## 2015-01-15 LAB — LIPASE, BLOOD: Lipase: 60 U/L — ABNORMAL HIGH (ref 22–51)

## 2015-01-15 MED ORDER — PALONOSETRON HCL INJECTION 0.25 MG/5ML
0.2500 mg | Freq: Once | INTRAVENOUS | Status: AC
  Administered 2015-01-15: 0.25 mg via INTRAVENOUS
  Filled 2015-01-15: qty 5

## 2015-01-15 MED ORDER — POLYETHYLENE GLYCOL 3350 17 G PO PACK
17.0000 g | PACK | Freq: Every day | ORAL | Status: DC | PRN
  Administered 2015-01-16: 17 g via ORAL
  Filled 2015-01-15 (×2): qty 1

## 2015-01-15 MED ORDER — ENSURE ENLIVE PO LIQD: 237.0000 mL | Freq: Two times a day (BID) | ORAL | Status: DC

## 2015-01-15 MED ORDER — ONDANSETRON HCL 4 MG PO TABS: 4.0000 mg | ORAL_TABLET | Freq: Four times a day (QID) | ORAL | Status: DC | PRN

## 2015-01-15 MED ORDER — ALBUTEROL SULFATE (2.5 MG/3ML) 0.083% IN NEBU: 2.5000 mg | INHALATION_SOLUTION | RESPIRATORY_TRACT | Status: DC | PRN

## 2015-01-15 MED ORDER — GUAIFENESIN-DM 100-10 MG/5ML PO SYRP: 5.0000 mL | ORAL_SOLUTION | ORAL | Status: DC | PRN

## 2015-01-15 MED ORDER — POTASSIUM CHLORIDE CRYS ER 20 MEQ PO TBCR
20.0000 meq | EXTENDED_RELEASE_TABLET | Freq: Two times a day (BID) | ORAL | Status: DC
  Administered 2015-01-15 – 2015-01-17 (×4): 20 meq via ORAL
  Filled 2015-01-15 (×6): qty 1

## 2015-01-15 MED ORDER — PALBOCICLIB 75 MG PO CAPS: 75.0000 mg | ORAL_CAPSULE | Freq: Every day | ORAL | Status: DC

## 2015-01-15 MED ORDER — SODIUM CHLORIDE 0.9 % IV SOLN
10.0000 mg | Freq: Once | INTRAVENOUS | Status: AC
  Administered 2015-01-15: 10 mg via INTRAVENOUS
  Filled 2015-01-15: qty 1

## 2015-01-15 MED ORDER — TRAMADOL HCL 50 MG PO TABS
50.0000 mg | ORAL_TABLET | Freq: Four times a day (QID) | ORAL | Status: DC | PRN
  Administered 2015-01-15 – 2015-01-17 (×3): 50 mg via ORAL
  Filled 2015-01-15 (×4): qty 1

## 2015-01-15 MED ORDER — SODIUM CHLORIDE 0.9 % IV SOLN
INTRAVENOUS | Status: DC
  Administered 2015-01-15: 15:00:00 via INTRAVENOUS

## 2015-01-15 MED ORDER — PANTOPRAZOLE SODIUM 40 MG PO TBEC
40.0000 mg | DELAYED_RELEASE_TABLET | Freq: Every day | ORAL | Status: DC
  Administered 2015-01-16 – 2015-01-17 (×2): 40 mg via ORAL
  Filled 2015-01-15 (×2): qty 1

## 2015-01-15 MED ORDER — LIDOCAINE-PRILOCAINE 2.5-2.5 % EX CREA
TOPICAL_CREAM | Freq: Once | CUTANEOUS | Status: DC
  Filled 2015-01-15: qty 5

## 2015-01-15 MED ORDER — ONDANSETRON HCL 4 MG/2ML IJ SOLN: 4.0000 mg | Freq: Four times a day (QID) | INTRAMUSCULAR | Status: DC | PRN

## 2015-01-15 MED ORDER — SODIUM CHLORIDE 0.9 % IV SOLN
INTRAVENOUS | Status: DC
  Administered 2015-01-16: 17:00:00 via INTRAVENOUS

## 2015-01-15 MED ORDER — PROMETHAZINE HCL 25 MG RE SUPP: 25.0000 mg | Freq: Two times a day (BID) | RECTAL | Status: DC | PRN

## 2015-01-15 MED ORDER — BOOST / RESOURCE BREEZE PO LIQD: 1.0000 | Freq: Three times a day (TID) | ORAL | Status: DC

## 2015-01-15 NOTE — Telephone Encounter (Signed)
Appointments made and avs printed for patient °

## 2015-01-15 NOTE — Procedures (Addendum)
[facility]  Procedure Note  Brenda Avery RZN:[id number] DOB: 1971-07-30 DOA: 01/15/2015   PCP: [name], MD   Associated Diagnosis: Carcinoma of left breast metastatic to multiple sites (174.9 , 199.0)  Procedure Note: IV infusion of normal saline, decadron, and aloxi; unable to draw lab from peripheral IV   Condition During Procedure:  Pt tolerated well   Condition at Discharge:  No complications noted; pt and family escorted to [facility] admitting office   Tamala Julian, Salley Slaughter, [facility]

## 2015-01-15 NOTE — H&P (Addendum)
PATIENT DETAILS Name: Brenda Avery Age: 44 y.o. Sex: female Date of Birth: June 18, 1971 Admit Date: 01/15/2015 HDQ:[id number] Moshe Cipro, MD Referring Physician:Dr Lindi Adie   CHIEF COMPLAINT:  Nausea/Vomiting10 days or so  HPI: Brenda Avery is a 44 y.o. female with a Past Medical History of metastatic breast cancer stage IIIa last chemotherapy approximately 2 weeks back, known history of sarcoidosis of the liver who presents today with the above noted complaint. Patient was hospitalized from 5/18-5/28 with complaints of nausea/vomiting and diarrhea. She was managed with supportive care and discharged on 5/20. The patient she felt better. One or 2 days, and then again she had recurrence of persistent nausea and intermittent vomiting. Denies any diarrhea or abdominal pain. Denies any chest pain or shortness of breath. Denies any cough. Denies any fever or headaches. She was seen by her primary oncologist, on follow-up labs she was found to have a resting of LFTs, subsequently referred to the hospitalist service for further evaluation and treatment   ALLERGIES:   Allergies  Allergen Reactions  . Metronidazole Hives and Itching  . Morphine And Related Other (See Comments)    Patient prefers not to have because it is to strong.  . Sulfonamide Derivatives Hives, Itching and Rash    PAST MEDICAL HISTORY: Past Medical History  Diagnosis Date  . Hyperlipidemia     diet therapy per patient request  . 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 Hematuria 2000/2001    evaluted in [place] with negative results  . Breast CA     left  . S/P radiation therapy  05/10/2013-06/23/2013    Left chest wall / 50.4 Gray @ 1.8 Gray per fraction x 28 fractions/ Left Supraclavicular fossa / 92 Gray @1 .8 Gray per fraction x 25 fractions/ Left PAB / 45 Gy at 1.8 Gray per fraction x 25 fractions/Left scar / 10 Gray at [employer] per fraction x 5 fractions     . Status post chemotherapy    Radiation Therapy Radiosensitizing Xeloda  . Status post chemotherapy  09/26/2012 - 11/07/2012      Neoadjuvant chemotherapy consisting of 4 cycles of a.c. From 09/26/2012 through 11/07/2012 she then received 12 weeks of paclitaxel.  . Use of tamoxifen (Nolvadex) started 06/26/13  . Type II diabetes mellitus     diet therapy     PAST SURGICAL HISTORY: Past Surgical History  Procedure Laterality Date  . Rectocele repair  2004  . Cystoscopy  2001  . Nevus excision  2004    facial  . Portacath placement  09/20/2012    Procedure: INSERTION PORT-A-CATH;  Surgeon: Adin Hector, MD;  Location: [place];  Service: General;  Laterality: N/A;  port a cath insertion with flouro and ultrasound   . Mastectomy Right 04/10/2013  . Mastectomy modified radical Left 04/10/2013  . Axillary sentinel node biopsy Right 04/10/2013  . Liver biopsy    . Breast biopsy Left 2014  . Tubal ligation  2004  . Mastectomy modified radical Left 04/10/2013    Procedure: LEFT MODIFIED RADICAL MASTECTOMY ;  Surgeon: Adin Hector, MD;  Location: [place];  Service: General;  Laterality: Left;  . Simple mastectomy with axillary sentinel node biopsy Right 04/10/2013    Procedure: RIGHT TOTAL  MASTECTOMY WITH AXILLARY SENTINEL NODE BIOPSY;  Surgeon: Adin Hector, MD;  Location: [place];  Service: General;  Laterality: Right;  Methylene Blue Injection  MEDICATIONS AT HOME: Prior to Admission medications   Medication Sig Start Date End Date Taking? Authorizing Provider  cephALEXin (KEFLEX) 500 MG capsule Take 1 capsule (500 mg total) by mouth 2 (two) times daily. 01/12/15  Yes Dorie Rank, MD  lidocaine-prilocaine (EMLA) cream Apply 1 application topically as needed. 06/22/14  Yes Laurie Panda, NP  ondansetron (ZOFRAN-ODT) 8 MG disintegrating tablet Take 8 mg by mouth every 8 (eight) hours as needed for nausea.  01/11/15  Yes Historical Provider, MD  oxyCODONE-acetaminophen (PERCOCET/ROXICET) 5-325 MG per tablet Take 1-2  tablets by mouth every 6 (six) hours as needed. 01/12/15  Yes Dorie Rank, MD  palbociclib [facility]) 75 MG capsule Take 1 capsule (75 mg total) by mouth daily with breakfast. Take whole with food. Patient taking differently: Take 75 mg by mouth daily with breakfast. Take whole with food. 11/20/14  Yes Nicholas Lose, MD  potassium chloride SA (K-DUR,KLOR-CON) 20 MEQ tablet Take 1 tablet (20 mEq total) by mouth 2 (two) times daily. 07/04/14  Yes Nicholas Lose, MD  promethazine (PHENERGAN) 25 MG suppository Place 1 suppository (25 mg total) rectally 2 (two) times daily as needed for nausea (if unable to swallow oral anti-emetics). 01/15/15  Yes Nicholas Lose, MD  promethazine (PHENERGAN) 25 MG tablet Take 25 mg by mouth every 6 (six) hours as needed for nausea or vomiting.   Yes Historical Provider, MD  methylPREDNISolone (MEDROL DOSEPAK) 4 MG TBPK tablet Take by mouth as directed. Take as directed Patient not taking: Reported on 01/15/2015 01/07/15   Nicholas Lose, MD  ondansetron (ZOFRAN) 4 MG tablet Take 1 tablet (4 mg total) by mouth every 8 (eight) hours as needed for nausea or vomiting. 01/04/15   Jonetta Osgood, MD    FAMILY HISTORY: Family History  Problem Relation Age of Onset  . Hypertension Mother   . Diabetes Mother     MI  . Heart disease Mother   . Hypertension Sister     x4 only one HTN and one thats DI  . Hypertension Brother     x4 only 1 htn & dm  . Heart disease Father   . Breast cancer Maternal Aunt     diagnosed in her 58s  . Cancer Maternal Aunt     unknown  . Breast cancer Paternal Aunt     diagnosed in her 26s  . Cervical cancer Sister 5  . Breast cancer Maternal Aunt     diagnosed in her 20s  . Breast cancer Cousin     3 maternal cousins - 2 in their 60s, 1 in her 105s   SOCIAL HISTORY:  reports that she has never smoked. She has never used smokeless tobacco. She reports that she does not drink alcohol or use illicit drugs. Lives at:  Home Mobility:Independent  REVIEW OF SYSTEMS:  Constitutional:   No  weight loss, night sweats,  Fevers, chills, fatigue.  HEENT:    No headaches, Dysphagia,Tooth/dental problems,Sore throat,  No sneezing, itching, ear ache, nasal congestion, post nasal drip  Cardio-vascular: No chest pain,Orthopnea, PND,lower extremity edema, anasarca, palpitations  GI:  No heartburn, indigestion, abdominal pain, diarrhea, melena or hematochezia  Resp: No shortness of breath, cough, hemoptysis,plueritic chest pain.   Skin:  No rash or lesions.  GU:  No dysuria, change in color of urine, no urgency or frequency.  No flank pain.  Musculoskeletal: No joint pain or swelling.  No decreased range of motion.  No back pain.  Endocrine: No heat intolerance, no  cold intolerance, no polyuria, no polydipsia  Psych: No change in mood or affect. No depression or anxiety.  No memory loss.   PHYSICAL EXAM: Blood pressure 100/58, pulse 100, temperature 98.2 F (36.8 C), temperature source Oral, resp. rate 20, SpO2 100 %.  General appearance :Awake, alert, not in any distress. Speech Clear. Not toxic Looking HEENT: Atraumatic and Normocephalic, pupils equally reactive to light and accomodation Neck: supple, no JVD. No cervical lymphadenopathy.  Chest:Good air entry bilaterally, no added sounds  CVS: S1 S2 regular, no murmurs.  Abdomen: Bowel sounds present, Non tender and not distended with no gaurding, rigidity or rebound. Extremities: B/L Lower Ext shows no edema, both legs are warm to touch Neurology:  Non focal Skin:No Rash Wounds:N/A  LABS ON ADMISSION:   Recent Labs  01/15/15 1324  NA 135*  K 3.4*  CO2 25  GLUCOSE 137  BUN 11.5  CREATININE 0.8  CALCIUM 7.7*    Recent Labs  01/15/15 1324  AST 188*  ALT 119*  ALKPHOS 311*  BILITOT 2.04*  PROT 7.7  ALBUMIN 2.9*   No results for input(s): LIPASE, AMYLASE in the last 72 hours.  Recent Labs  01/15/15 1324  WBC 5.2   NEUTROABS 4.2  HGB 13.9  HCT 42.3  MCV 81.4  PLT 352   No results for input(s): CKTOTAL, CKMB, CKMBINDEX, TROPONINI in the last 72 hours. No results for input(s): DDIMER in the last 72 hours. Invalid input(s): POCBNP   RADIOLOGIC STUDIES ON ADMISSION: No results found.   EKG: Pending  ASSESSMENT AND PLAN: Present on Admission:  . Elevated LFTs: Has persistently and worsening LFTs over the past few weeks. Recent hepatitis serology was negative. Since has a history of metastatic breast cancer and also has a history of liver sarcoidosis-not sure which is causing worsening of liver enzymes, oncology recommends a liver biopsy. We will check PT/INR, ask interventional radiology to evaluate. Keep nothing by mouth post midnight.   . Intractable nausea and vomiting: Suspect secondary to underlying malignant disease. Supportive care with as needed antiemetics, and IV fluids and other supportive measures. May need to be started on Decadron as well-we will hold off till liver biopsy is done. Abdomen soft and nondistended, with check lipase and check a x-ray to see if any acute abnormalities or if any significant stool burden.   . Dehydration: Secondary to above. Hydrate and reassess in a.m.  . Carcinoma of left breast metastatic to multiple sites: Per oncology patient off all chemotherapy medications be given persistent nausea/vomiting. Liver biopsy planned. Continue to hold chemotherapy for now.   . History of liver sarcoidosis: Hold off on starting steroids, check Ace level, IR consult for liver biopsy.  Further plan will depend as patient's clinical course evolves and further radiologic and laboratory data become available. Patient will be monitored closely.  Please note above plan was formulated after personal review and summarization of most recent inpatient/outpatient records.  Plan of care was also discussed with the consultant-Dr Gudena with recommendations as stated above.  Above  noted plan was discussed with patient/family face to face at bedside, they were in agreement.   CONSULTS: IR Onc-Dr Lindi Adie  DVT Prophylaxis: SCD's  Code Status: Full Code  Disposition Plan:  Discharge back home possibly in 2 days   Total time spent  55 minutes.Greater than 50% of this time was spent in counseling, explanation of diagnosis, planning of further management, and coordination of care.  [facility] Pager [phone]  If 7PM-7AM,  please contact night-coverage [email or web address] Password [facility] 01/15/2015, 6:28 PM

## 2015-01-15 NOTE — Progress Notes (Signed)
EKG on admission, "sinus tach, cannot rule out inferior infarct." Paged Levi Aland, NP, with result.

## 2015-01-15 NOTE — Progress Notes (Signed)
Pt to be admitted- hospitalist Dr. Erlinda Hong.  INR requested and ordered - [facility] will draw if possible.  If unable to draw at [facility], will need to be drawn inpatient.  [facility] will send patient to admitting after completing fluids.  Bed assignment to [address] [place] pending - admitting states bed should be assigned by the time patient gets to admitting.

## 2015-01-15 NOTE — Telephone Encounter (Signed)
[facility] report received from [facility] 01/11/15.  Sent to scan.    Order for zofran and ultram faxed to [facility].  Sent to scan.

## 2015-01-15 NOTE — Progress Notes (Signed)
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 She also has suspicious bone lesions but not easily accessible acc.to IR.  Therefore we are giving her the benefit of the doubt and treating her with curative intent. MRI suggests nodal involvement in the axilla.        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 Liver metastases   09/14/2012 Cancer Staging PET scan- multiple hypermetabolic areas left pectoral node the breast mass, left hilar, suprahilar, right hilar, right suprahilar, subcarinal lymph node, liver, right iliac, bilateral inguinal nodes, sternum lytic lesion    09/19/2012 Procedure Liver biopsy- Sarcoidosis   09/26/2012 - 01/10/2013 Chemotherapy AC x 4 followed by weekly Paclitaxel x 12. Marker response supraclavicular and mediastinal nodes decreasing the breast mass and liver lesions resolution of sternal activity   04/10/2013 Surgery Bilateral mastectomies, left decidual IDC grade 2 x 0.7 cm with high-grade DCIS LV I 8/11 lymph nodes positive with extracapsular extension ER 99% PR 40%, HER-2 negative. R: No cancer   04/12/2013 Procedure Genetic testing did not reveal BRCA mutations   05/22/2013 - 06/23/2013 Radiation Therapy Adjuvant radiation therapy with Xeloda   06/23/2013 -  Anti-estrogen oral therapy Tamoxifen 20 mg daily with Zoladex every 3 months   05/28/2014 Relapse/Recurrence Bony metastatic disease: Sternum biopsy metastatic carcinoma, HER-2 negative ratio 0.74   06/15/2014 -  Anti-estrogen oral therapy Ibrance, faslodex, xgeva    CHIEF COMPLIANT: Profound dehydration, intractable nausea and vomiting  INTERVAL HISTORY: Brenda Avery is a 44 year old with above-mentioned history of metastatic breast cancer was being currently on [place] with Faslodex and Xgeva. Over the past 3 weeks she has been getting progressively more nausea and vomiting along with low back pain. We have noticed slowly progressing elevation of AST and ALT. Based on a previous liver biopsy showing sarcoidosis, we attributed these findings in the liver to sarcoidosis. But she continues to get worse and she is in a wheelchair today looking profoundly dehydrated. cannot drink or eat anything.  REVIEW OF SYSTEMS:   Constitutional: He appears dehydrated with sunken eyeballs Eyes: Denies blurriness of vision Ears, nose, mouth, throat, and face: Denies mucositis or sore throat Respiratory: Denies cough, dyspnea or wheezes Cardiovascular: Denies palpitation, chest discomfort or lower extremity swelling Gastrointestinal: Intractable nausea and vomiting Skin: Denies abnormal skin rashes Lymphatics: Denies new lymphadenopathy or easy bruising Neurological:Denies numbness, tingling or new weaknesses Behavioral/Psych: Very sad and depressed appearing  All other systems were reviewed with the patient and are negative.  I have reviewed the past medical history, past surgical history, social history and family history with the patient and they are unchanged from previous note.  ALLERGIES:  is allergic to metronidazole; morphine and related; and  sulfonamide derivatives.  MEDICATIONS:  Current Outpatient Prescriptions  Medication Sig Dispense Refill  . cephALEXin (KEFLEX) 500 MG capsule  Take 1 capsule (500 mg total) by mouth 2 (two) times daily. 10 capsule 0  . lidocaine-prilocaine (EMLA) cream Apply 1 application topically as needed. 30 g 0  . methylPREDNISolone (MEDROL DOSEPAK) 4 MG TBPK tablet Take by mouth as directed. Take as directed (Patient not taking: Reported on 01/12/2015) 21 tablet 0  . ondansetron (ZOFRAN) 4 MG tablet Take 1 tablet (4 mg total) by mouth every 8 (eight) hours as needed for nausea or vomiting. 30 tablet 0  . oxyCODONE-acetaminophen (PERCOCET/ROXICET) 5-325 MG per tablet Take 1-2 tablets by mouth every 6 (six) hours as needed. 20 tablet 0  . palbociclib (IBRANCE) 75 MG capsule Take 1 capsule (75 mg total) by mouth daily with breakfast. Take whole with food. (Patient taking differently: Take 75 mg by mouth daily with breakfast. Take whole with food.) 21 capsule 1  . potassium chloride SA (K-DUR,KLOR-CON) 20 MEQ tablet Take 1 tablet (20 mEq total) by mouth 2 (two) times daily. 60 tablet 1  . promethazine (PHENERGAN) 25 MG tablet Take 1 tablet (25 mg total) by mouth every 8 (eight) hours as needed for nausea or vomiting. 15 tablet 0   No current facility-administered medications for this visit.    PHYSICAL EXAMINATION: ECOG PERFORMANCE STATUS: 3 - Symptomatic, >50% confined to bed  There were no vitals filed for this visit. There were no vitals filed for this visit.  GENERAL:alert, no distress and comfortable SKIN: skin color, texture, turgor are normal, no rashes or significant lesions EYES: normal, Conjunctiva are pink and non-injected, sclera clear OROPHARYNX:no exudate, no erythema and lips, buccal mucosa, and tongue normal  NECK: supple, thyroid normal size, non-tender, without nodularity LYMPH:  no palpable lymphadenopathy in the cervical, axillary or inguinal LUNGS: clear to auscultation and percussion with normal breathing effort HEART: Tachycardia ABDOMEN: Moderate distention of the belly Musculoskeletal:no cyanosis of digits and no  clubbing  NEURO: alert & oriented x 3 with fluent speech, no focal motor/sensory deficits  LABORATORY DATA:  I have reviewed the data as listed   Chemistry      Component Value Date/Time   NA 135 01/12/2015 1610   NA 140 01/07/2015 0854   K 3.3* 01/12/2015 1610   K 3.4* 01/07/2015 0854   CL 95* 01/12/2015 1610   CO2 27 01/12/2015 1610   CO2 24 01/07/2015 0854   BUN 12 01/12/2015 1610   BUN 10.3 01/07/2015 0854   CREATININE 0.78 01/12/2015 1610   CREATININE 0.9 01/07/2015 0854      Component Value Date/Time   CALCIUM 7.5* 01/12/2015 1610   CALCIUM 7.4* 01/07/2015 0854   ALKPHOS 256* 01/12/2015 1610   ALKPHOS 241* 01/07/2015 0854   AST 131* 01/12/2015 1610   AST 123* 01/07/2015 0854   ALT 100* 01/12/2015 1610   ALT 100* 01/07/2015 0854   BILITOT 1.3* 01/12/2015 1610   BILITOT 1.29* 01/07/2015 0854       Lab Results  Component Value Date   WBC 5.2 01/15/2015   HGB 13.9 01/15/2015   HCT 42.3 01/15/2015   MCV 81.4 01/15/2015   PLT 352 01/15/2015   NEUTROABS 4.2 01/15/2015   ASSESSMENT & PLAN:  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 Patient is currently on Ibrance, Faslodex, [place] that started 06/15/14.  CT scans and Bone scan 12/03/14: Non PET avid MediastimalLN, Multifocal Liver lesions, ? Sarcoidosis in spleen, Progression of abdominal/ retroperitoneal LN, Sternum sclerotic lesion, Pelvic mets.  Hospitalization 5/18-5/20: Nausea, vomiting, Diarrhea, dehydration  Plan: 1. Patient is profoundly dehydrated with tachycardia.  I sent her to sickle cell to get some fluids with Aloxi and dexamethasone  2. Blood work returned back with an AST of 188 ALT of 119 and a bilirubin of 2. Given her worsening clinical situation, I will request our hospitalist to admit her and get workup done for worsening liver function tests.   3. Previous liver biopsy showed sarcoidosis.   given this confusion about the liver function tests, I recommend repeating a liver biopsy to determine if it is still sarcoidosis or related to metastatic breast cancer. This would be essential to know for both prognostic and future treatment purpose.   4. Intractable nausea and vomiting: I sent a prescription for Phenergan suppositories. Patient was not able to keep any oral antinausea drugs.   5. Sarcoidosis: I recommend getting a rheumatology evaluation to assist with management of her complex clinical condition. 6. In the meantime I recommended systemic steroidal therapy.  In the interim until she feels better, I recommended holding off further treatment with Ibrance or Faslodex.  No orders of the defined types were placed in this encounter.   The patient has a good understanding of the overall plan. she agrees with it. she will call with any problems that may develop before the next visit here.   Rulon Eisenmenger, MD

## 2015-01-16 ENCOUNTER — Inpatient Hospital Stay (HOSPITAL_COMMUNITY): Payer: BLUE CROSS/BLUE SHIELD

## 2015-01-16 DIAGNOSIS — C7951 Secondary malignant neoplasm of bone: Secondary | ICD-10-CM

## 2015-01-16 DIAGNOSIS — C50912 Malignant neoplasm of unspecified site of left female breast: Secondary | ICD-10-CM

## 2015-01-16 DIAGNOSIS — E440 Moderate protein-calorie malnutrition: Secondary | ICD-10-CM | POA: Insufficient documentation

## 2015-01-16 DIAGNOSIS — D8689 Sarcoidosis of other sites: Secondary | ICD-10-CM

## 2015-01-16 DIAGNOSIS — R112 Nausea with vomiting, unspecified: Secondary | ICD-10-CM

## 2015-01-16 DIAGNOSIS — D649 Anemia, unspecified: Secondary | ICD-10-CM

## 2015-01-16 LAB — COMPREHENSIVE METABOLIC PANEL
ALT: 83 U/L — AB (ref 14–54)
ANION GAP: 9 (ref 5–15)
AST: 136 U/L — AB (ref 15–41)
Albumin: 2.4 g/dL — ABNORMAL LOW (ref 3.5–5.0)
Alkaline Phosphatase: 211 U/L — ABNORMAL HIGH (ref 38–126)
BUN: 7 mg/dL (ref 6–20)
CO2: 24 mmol/L (ref 22–32)
Calcium: 6.1 mg/dL — CL (ref 8.9–10.3)
Chloride: 107 mmol/L (ref 101–111)
Creatinine, Ser: 0.43 mg/dL — ABNORMAL LOW (ref 0.44–1.00)
GFR calc Af Amer: >60 mL/min (ref >60)
GFR calc non Af Amer: >60 mL/min (ref >60)
Glucose, Bld: 137 mg/dL — ABNORMAL HIGH (ref 65–99)
Potassium: 3.1 mmol/L — ABNORMAL LOW (ref 3.5–5.1)
Sodium: 140 mmol/L (ref 135–145)
Total Bilirubin: 1.4 mg/dL — ABNORMAL HIGH (ref 0.3–1.2)
Total Protein: 5.9 g/dL — ABNORMAL LOW (ref 6.5–8.1)

## 2015-01-16 LAB — CBC
HCT: 30.9 % — ABNORMAL LOW (ref 36.0–46.0)
Hemoglobin: 10.3 g/dL — ABNORMAL LOW (ref 12.0–15.0)
MCH: 26.7 pg (ref 26.0–34.0)
MCHC: 33.3 g/dL (ref 30.0–36.0)
MCV: 80.1 fL (ref 78.0–100.0)
Platelets: 222 10*3/uL (ref 150–400)
RBC: 3.86 MIL/uL — ABNORMAL LOW (ref 3.87–5.11)
RDW: 15.4 % (ref 11.5–15.5)
WBC: 3.9 10*3/uL — ABNORMAL LOW (ref 4.0–10.5)

## 2015-01-16 LAB — HIV ANTIBODY (ROUTINE TESTING W REFLEX): HIV Screen 4th Generation wRfx: NONREACTIVE

## 2015-01-16 MED ORDER — SODIUM CHLORIDE 0.9 % IV SOLN
1.0000 g | Freq: Once | INTRAVENOUS | Status: AC
  Administered 2015-01-16: 1 g via INTRAVENOUS
  Filled 2015-01-16: qty 10

## 2015-01-16 MED ORDER — MIDAZOLAM HCL 2 MG/2ML IJ SOLN
INTRAMUSCULAR | Status: AC
  Filled 2015-01-16: qty 4

## 2015-01-16 MED ORDER — FLUMAZENIL 0.5 MG/5ML IV SOLN
INTRAVENOUS | Status: AC
  Filled 2015-01-16: qty 5

## 2015-01-16 MED ORDER — FENTANYL CITRATE (PF) 100 MCG/2ML IJ SOLN
INTRAMUSCULAR | Status: AC
  Filled 2015-01-16: qty 2

## 2015-01-16 MED ORDER — FENTANYL CITRATE (PF) 100 MCG/2ML IJ SOLN
INTRAMUSCULAR | Status: AC | PRN
  Administered 2015-01-16: 25 ug via INTRAVENOUS

## 2015-01-16 MED ORDER — DOCUSATE SODIUM 100 MG PO CAPS
200.0000 mg | ORAL_CAPSULE | Freq: Two times a day (BID) | ORAL | Status: DC
  Administered 2015-01-17: 200 mg via ORAL
  Filled 2015-01-16 (×3): qty 2

## 2015-01-16 MED ORDER — CALCIUM CARBONATE-VITAMIN D 500-200 MG-UNIT PO TABS: 2.0000 | ORAL_TABLET | Freq: Three times a day (TID) | ORAL | Status: DC

## 2015-01-16 MED ORDER — MIDAZOLAM HCL 2 MG/2ML IJ SOLN
INTRAMUSCULAR | Status: AC | PRN
  Administered 2015-01-16: 1 mg via INTRAVENOUS

## 2015-01-16 MED ORDER — NALOXONE HCL 0.4 MG/ML IJ SOLN
INTRAMUSCULAR | Status: AC
  Filled 2015-01-16: qty 1

## 2015-01-16 MED ORDER — CALCIUM CARBONATE-VITAMIN D 500-200 MG-UNIT PO TABS
2.0000 | ORAL_TABLET | Freq: Two times a day (BID) | ORAL | Status: DC
  Administered 2015-01-16 – 2015-01-17 (×3): 2 via ORAL
  Filled 2015-01-16 (×4): qty 2

## 2015-01-16 MED ORDER — CALCITRIOL 0.25 MCG PO CAPS
0.2500 ug | ORAL_CAPSULE | Freq: Every day | ORAL | Status: DC
  Administered 2015-01-16 – 2015-01-17 (×2): 0.25 ug via ORAL
  Filled 2015-01-16 (×2): qty 1

## 2015-01-16 MED ORDER — POTASSIUM CHLORIDE CRYS ER 20 MEQ PO TBCR
20.0000 meq | EXTENDED_RELEASE_TABLET | Freq: Once | ORAL | Status: AC
  Administered 2015-01-16: 20 meq via ORAL

## 2015-01-16 NOTE — Progress Notes (Addendum)
PATIENT DETAILS Name: Brenda Avery Age: 44 y.o. Sex: female Date of Birth: 1971/03/05 Admit Date: 01/15/2015 Admitting Physician Florencia Reasons, MD [id number] Moshe Cipro, MD  Subjective: No nausea/vomiting.  Assessment/Plan: Elevated LFTs: Has persistently and worsening LFTs over the past few weeks. Recent hepatitis serology was negative. Since has a history of metastatic breast cancer and also has a history of liver sarcoidosis-not sure which is causing worsening of liver enzymes, oncology recommends a liver biopsy. Interventional radiology was consulted, for liver biopsy today.   Intractable nausea and vomiting: Suspect secondary to underlying malignant disease.Much better with supportive care, as needed antiemetics, IV fluids. Abdomen exam benign, lipase mildly elevated-?significance.  XRay Abd negative for acute abnormalities.    Dehydration: Secondary to above.Much better volume status  Hypokalemia: Replete and recheck. Secondary to GI loss.  Hypocalcemia: Start vitamin D/calcium supplementation. Check PTH and vitamin D levels.  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 Liver biopsy planned  History of liver sarcoidosis: Hold off on starting steroids, check Ace level, IR consult for liver biopsy.  Disposition: Remain inpatient  Antimicrobial agents  See below  Anti-infectives    None      DVT Prophylaxis: SCD's  Code Status: Full code   Family Communication Spouse at bedside  Procedures: None  CONSULTS:  IR  Time spent 30 minutes-Greater than 50% of this time was spent in counseling, explanation of diagnosis, planning of further management, and coordination of care.  MEDICATIONS: Scheduled Meds: . lidocaine-prilocaine   Topical Once  . pantoprazole  40 mg Oral Daily  . potassium chloride SA  20 mEq Oral BID   Continuous Infusions: . sodium  chloride     PRN Meds:.albuterol, guaiFENesin-dextromethorphan, ondansetron **OR** ondansetron (ZOFRAN) IV, polyethylene glycol, traMADol    PHYSICAL EXAM: Vital signs in last 24 hours: Filed Vitals:   01/15/15 1746 01/15/15 2115 01/16/15 0555  BP: 100/58 129/79 110/79  Pulse: 100 98 77  Temp: 98.2 F (36.8 C) 98.7 F (37.1 C) 97.1 F (36.2 C)  TempSrc: Oral Oral Oral  Resp: 20 18 16   Height:   5\' 4"  (1.626 m)  Weight:   77.2 kg (170 lb 3.1 oz)  SpO2: 100% 98% 99%    Weight change:  Filed Weights   01/16/15 0555  Weight: 77.2 kg (170 lb 3.1 oz)   Body mass index is 29.2 kg/(m^2).   Gen Exam: Awake and alert with clear speech.  Neck: Supple, No JVD.   Chest: B/L Clear.   CVS: S1 S2 Regular, no murmurs.  Abdomen: soft, BS +, non tender, non distended.  Extremities: no edema, lower extremities warm to touch Neurologic: Non Focal.   Skin: No Rash.   Wounds: N/A.    Intake/Output from previous day:  Intake/Output Summary (Last 24 hours) at 01/16/15 1147 Last data filed at 01/16/15 0940  Gross per 24 hour  Intake      0 ml  Output    500 ml  Net   -500 ml     LAB RESULTS: CBC  Recent Labs Lab 01/09/15 1851 01/12/15 1610 01/15/15 1324 01/16/15 0536  WBC 5.9 4.7 5.2 3.9*  HGB 13.8 14.1 13.9 10.3*  HCT 41.2 41.3 42.3 30.9*  PLT 357 317 352 222  MCV 80.3 80.4 81.4 80.1  MCH 26.9 27.4 26.8 26.7  MCHC 33.5 34.1 33.0 33.3  RDW 15.5 15.3 16.1* 15.4  LYMPHSABS 0.6* 0.8 0.5*  --   MONOABS 0.5 0.4 0.3  --   EOSABS 0.0 0.1 0.1  --   BASOSABS 0.0 0.0 0.1  --     Chemistries   Recent Labs Lab 01/09/15 1851 01/12/15 1610 01/15/15 1324 01/16/15 0536  NA 137 135 135* 140  K 3.3* 3.3* 3.4* 3.1*  CL 96* 95*  --  107  CO2 26 27 25 24   GLUCOSE 142* 139* 137 137*  BUN 14 12 11.5 7  CREATININE 0.85 0.78 0.8 0.43*  CALCIUM 8.1* 7.5* 7.7* 6.1*    CBG: No results for input(s): GLUCAP in the last 168 hours.  GFR Estimated Creatinine Clearance: 89.3  mL/min (by C-G formula based on Cr of 0.43).  Coagulation profile  Recent Labs Lab 01/15/15 2110  INR 1.28    Cardiac Enzymes No results for input(s): CKMB, TROPONINI, MYOGLOBIN in the last 168 hours.  Invalid input(s): CK  Invalid input(s): POCBNP No results for input(s): DDIMER in the last 72 hours. No results for input(s): HGBA1C in the last 72 hours. No results for input(s): CHOL, HDL, LDLCALC, TRIG, CHOLHDL, LDLDIRECT in the last 72 hours. No results for input(s): TSH, T4TOTAL, T3FREE, THYROIDAB in the last 72 hours.  Invalid input(s): FREET3 No results for input(s): VITAMINB12, FOLATE, FERRITIN, TIBC, IRON, RETICCTPCT in the last 72 hours.  Recent Labs  01/15/15 2110  LIPASE 60*    Urine Studies No results for input(s): UHGB, CRYS in the last 72 hours.  Invalid input(s): UACOL, UAPR, USPG, UPH, UTP, UGL, UKET, UBIL, UNIT, UROB, ULEU, UEPI, UWBC, URBC, UBAC, CAST, UCOM, BILUA  MICROBIOLOGY: No results found for this or any previous visit (from the past 240 hour(s)).  RADIOLOGY STUDIES/RESULTS: Dg Lumbar Spine Complete  01/12/2015   CLINICAL DATA:  Low back pain starting today, no trauma, history of breast cancer  EXAM: LUMBAR SPINE - COMPLETE 4+ VIEW  COMPARISON:  Sagittal view of the lumbar spine 12/03/2014.  FINDINGS: There is no evidence of lumbar spine fracture. Alignment is normal. Intervertebral disc spaces are maintained.  IMPRESSION: Negative.   Electronically Signed   By: Lahoma Crocker M.D.   On: 01/12/2015 16:39   Dg Abd 1 View  01/03/2015   CLINICAL DATA:  Nausea and vomiting. Upper abdominal pain. Symptoms for 3 days.  EXAM: ABDOMEN - 1 VIEW  COMPARISON:  CT 12/03/2014  FINDINGS: There is no evidence of free intra-abdominal air. No dilated bowel loops to suggest obstruction. Small volume of stool throughout the colon. No radiopaque calculi. Left tubal ligation clip is seen. There are pelvic phleboliths. No acute osseous abnormalities are seen.  IMPRESSION:  Normal bowel gas pattern.   Electronically Signed   By: Jeb Levering M.D.   On: 01/03/2015 01:58   Dg Abd Portable 2v  01/15/2015   CLINICAL DATA:  Abdominal pain and vomiting. History of breast cancer.  EXAM: PORTABLE ABDOMEN - 2 VIEW  COMPARISON:  None.  FINDINGS: The bowel gas pattern is normal. There is no evidence of free air. No radio-opaque calculi or other significant radiographic abnormality is seen.  IMPRESSION: Negative.   Electronically Signed   By: Margarette Canada M.D.   On: 01/15/2015 21:28   US Abdomen Limited Ruq  01/03/2015   CLINICAL DATA:  Elevated liver function tests. History of breast cancer.  EXAM: US ABDOMEN LIMITED - RIGHT UPPER QUADRANT  COMPARISON:  CT scan of December 03, 2014.  FINDINGS: Gallbladder:  No  gallstones or wall thickening visualized. No sonographic Murphy sign noted.  Common bile duct:  Diameter: 3.7 mm which is within normal limits.  Liver:  Multiple hypoechoic rounded hypoechoic areas are noted throughout hepatic parenchyma consistent with metastatic disease. The largest measures 3.7 cm in diameter.  IMPRESSION: Findings consistent with multiple hepatic metastases. No gallstones or evidence of biliary obstruction is noted.   Electronically Signed   By: Marijo Conception, M.D.   On: 01/03/2015 08:58    Oren Binet, MD  [facility] Pager:[phone]  If 7PM-7AM, please contact night-coverage [email or web address] Password [email or web address] 01/16/2015, 11:47 AM   LOS: 1 day

## 2015-01-16 NOTE — Progress Notes (Signed)
CRITICAL VALUE ALERT  Critical value received:  06:15  Date of notification:  01/16/2015  Time of notification: 06:30  Critical value read back: yes Nurse who received alert:  Walden Field  MD notified (1st page):   Time of first page:   MD notified (2nd page):  Time of second page:  Responding MD:    Time MD responded:

## 2015-01-16 NOTE — Progress Notes (Signed)
Status post liver biopsy, v/s stable, biopsy site on right upper quadrant , negative for bleeding, hematoma and pain.

## 2015-01-16 NOTE — Progress Notes (Signed)
HEMATOLOGY-ONCOLOGY  SUBJECTIVE: Patient feels much better than yesterday. She even feels hungry and plans to eat a burger today. She underwent a liver biopsy earlier today and is slightly sore from it.  OBJECTIVE PHYSICAL EXAMINATION: ECOG PERFORMANCE STATUS: 3 - Symptomatic, >50% confined to bed  Filed Vitals:   01/16/15 1717  BP: 112/69  Pulse: 80  Temp: 98.5 F (36.9 C)  Resp: 20   Filed Weights   01/16/15 0555  Weight: 170 lb 3.1 oz (77.2 kg)    GENERAL:alert, no distress and comfortable SKIN: skin color, texture, turgor are normal, no rashes or significant lesions EYES: normal, Conjunctiva are pink and non-injected, sclera clear OROPHARYNX:no exudate, no erythema and lips, buccal mucosa, and tongue normal  NECK: supple, thyroid normal size, non-tender, without nodularity LYMPH:  no palpable lymphadenopathy in the cervical, axillary or inguinal LUNGS: clear to auscultation and percussion with normal breathing effort HEART: regular rate & rhythm and no murmurs and no lower extremity edema ABDOMEN: Soreness from recent biopsy Musculoskeletal:no cyanosis of digits and no clubbing  NEURO: alert & oriented x 3 with fluent speech, no focal motor/sensory deficits  LABORATORY DATA:  I have reviewed the data as listed CMP Latest Ref Rng 01/16/2015 01/15/2015 01/12/2015  Glucose 65 - 99 mg/dL 137(H) 137 139(H)  BUN 6 - 20 mg/dL 7 11.5 12  Creatinine 0.44 - 1.00 mg/dL 0.43(L) 0.8 0.78  Sodium 135 - 145 mmol/L 140 135(L) 135  Potassium 3.5 - 5.1 mmol/L 3.1(L) 3.4(L) 3.3(L)  Chloride 101 - 111 mmol/L 107 - 95(L)  CO2 22 - 32 mmol/L 24 25 27   Calcium 8.9 - 10.3 mg/dL 6.1(LL) 7.7(L) 7.5(L)  Total Protein 6.5 - 8.1 g/dL 5.9(L) 7.7 8.0  Total Bilirubin 0.3 - 1.2 mg/dL 1.4(H) 2.04(H) 1.3(H)  Alkaline Phos 38 - 126 U/L 211(H) 311(H) 256(H)  AST 15 - 41 U/L 136(H) 188(HH) 131(H)  ALT 14 - 54 U/L 83(H) 119(H) 100(H)      Lab Results  Component Value Date   WBC 3.9* 01/16/2015   HGB 10.3* 01/16/2015   HCT 30.9* 01/16/2015   MCV 80.1 01/16/2015   PLT 222 01/16/2015   NEUTROABS 4.2 01/15/2015    ASSESSMENT AND PLAN:  1. Metastatic breast cancer: Current treatment is on hold. She was on Faslodex with Ibrance. 2. Intractable nausea and vomiting: Much improved after fluids hydration and steroids. 3. Status post liver biopsy. 4. If the liver biopsy confirmed sarcoidosis, she could be treated with steroids. 5. Anemia 6. Elevation of AST and ALT improved after hydration and steroids

## 2015-01-16 NOTE — Progress Notes (Signed)
Initial Nutrition Assessment  DOCUMENTATION CODES:  Non-severe (moderate) malnutrition in context of chronic illness  INTERVENTION:  Diet advancement per MD When diet advanced, recommend Ensure Enlive po BID, each supplement provides 350 kcal and 20 grams of protein RD to continue to monitor  NUTRITION DIAGNOSIS:  Increased nutrient needs related to cancer and cancer related treatments as evidenced by estimated needs.  GOAL:  Patient will meet greater than or equal to 90% of their needs  MONITOR:  Diet advancement, Labs, Weight trends, Skin, I & O's  REASON FOR ASSESSMENT:  Malnutrition Screening Tool    ASSESSMENT: 44 y.o. female with a Past Medical History of metastatic breast cancer stage IIIa last chemotherapy approximately 2 weeks back, known history of sarcoidosis of the liver who presents today with Nausea/Vomiting10 days or so.  Pt reports not tolerating food for 2.5 weeks. Pt has had 25 lb weight loss since 3/28 (13% weight loss x 2 months, significant for time frame). Pt currently NPO for scheduled biopsy later today. Once diet advanced, pt would like to receive Ensure supplements. RD will continue to monitor.  Nutrition-Focused physical exam completed. Findings are mild fat depletion, no muscle depletion, and no edema.   Labs reviewed:  Low K, Creatinine  Height:  Ht Readings from Last 1 Encounters:  01/16/15 5\' 4"  (1.626 m)    Weight:  Wt Readings from Last 1 Encounters:  01/16/15 170 lb 3.1 oz (77.2 kg)    Ideal Body Weight:  54.5 kg  Wt Readings from Last 10 Encounters:  01/16/15 170 lb 3.1 oz (77.2 kg)  01/07/15 177 lb 8 oz (80.513 kg)  01/03/15 180 lb 5.4 oz (81.8 kg)  12/10/14 188 lb 12.8 oz (85.639 kg)  11/12/14 195 lb 9.6 oz (88.724 kg)  10/15/14 191 lb (86.637 kg)  09/04/14 191 lb 3.2 oz (86.728 kg)  08/20/14 196 lb (88.905 kg)  08/13/14 196 lb 4.8 oz (89.041 kg)  07/30/14 196 lb 6.4 oz (89.086 kg)    BMI:  Body mass index is 29.2  kg/(m^2).  Estimated Nutritional Needs:  Kcal:  1800-2000  Protein:  85-95g  Fluid:  1.8L/day    Skin:  Reviewed, no issues  Diet Order:  Diet NPO time specified  EDUCATION NEEDS:  No education needs identified at this time   Intake/Output Summary (Last 24 hours) at 01/16/15 1104 Last data filed at 01/16/15 0940  Gross per 24 hour  Intake      0 ml  Output    500 ml  Net   -500 ml    Last BM:  5/30  Clayton Bibles, MS, RD, LDN Pager: [phone] After Hours Pager: [phone]

## 2015-01-16 NOTE — Procedures (Signed)
Successful RT LIVER BX NO COMP STABLE FULL REPORT IN PACS PATH PENDING

## 2015-01-16 NOTE — Progress Notes (Signed)
Patient ID: Brenda Avery, female   DOB: October 01, 1970, 44 y.o.   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 She was recently admitted with N/V, diarrhea, elevated LFT's. Hepatitis serology was neg. Request now received for repeat US guided core liver biopsy to rule out sarcoid vs breast cancer as source of elevated LFT's. Pt currently denies fever, CP,dyspnea, abd pain, N/V or bleeding.     Allergies: Metronidazole; Morphine and related; and Sulfonamide derivatives  Medications: Prior to Admission medications   Medication Sig Start Date End Date Taking? Authorizing Provider  cephALEXin (KEFLEX) 500 MG capsule Take 1 capsule (500 mg total) by mouth 2 (two) times daily. 01/12/15  Yes Dorie Rank, MD  lidocaine-prilocaine (EMLA) cream Apply 1 application topically as needed. 06/22/14  Yes Laurie Panda, NP  ondansetron (ZOFRAN-ODT) 8 MG disintegrating tablet Take 8 mg by mouth every 8 (eight) hours as needed for nausea.  01/11/15  Yes Historical Provider, MD  oxyCODONE-acetaminophen (PERCOCET/ROXICET) 5-325 MG per tablet Take 1-2 tablets by mouth every 6 (six) hours as needed. 01/12/15  Yes Dorie Rank, MD  palbociclib [facility]) 75 MG capsule Take 1 capsule (75 mg total) by mouth daily with breakfast. Take whole with food. Patient taking differently: Take 75 mg by mouth daily with breakfast. Take whole with food. 11/20/14  Yes Nicholas Lose, MD  potassium chloride SA (K-DUR,KLOR-CON) 20 MEQ tablet Take 1 tablet (20 mEq total) by mouth 2 (two) times daily. 07/04/14  Yes Nicholas Lose, MD  promethazine (PHENERGAN) 25 MG suppository Place 1 suppository (25 mg total) rectally 2 (two) times daily as needed for nausea (if unable to swallow oral anti-emetics). 01/15/15  Yes Nicholas Lose, MD  promethazine (PHENERGAN) 25 MG tablet Take 25 mg by mouth  every 6 (six) hours as needed for nausea or vomiting.   Yes Historical Provider, MD  methylPREDNISolone (MEDROL DOSEPAK) 4 MG TBPK tablet Take by mouth as directed. Take as directed Patient not taking: Reported on 01/15/2015 01/07/15   Nicholas Lose, MD  ondansetron (ZOFRAN) 4 MG tablet Take 1 tablet (4 mg total) by mouth every 8 (eight) hours as needed for nausea or vomiting. 01/04/15   Jonetta Osgood, MD     Vital Signs: BP 110/79 mmHg  Pulse 77  Temp(Src) 97.1 F (36.2 C) (Oral)  Resp 16  Ht 5' 4"  (1.626 m)  Wt 170 lb 3.1 oz (77.2 kg)  BMI 29.20 kg/m2  SpO2 99%  Physical Exam pt awake/alert; chest- dim BS rt base, left clear, intact rt chest wall PAC; heart- RRR; abd- soft,+BS,NT; ext - no sig edema  Imaging: Dg Lumbar Spine Complete  01/12/2015   CLINICAL DATA:  Low back pain starting today, no trauma, history of breast cancer  EXAM: LUMBAR SPINE - COMPLETE 4+ VIEW  COMPARISON:  Sagittal view of the lumbar spine 12/03/2014.  FINDINGS: There is no evidence of lumbar spine fracture. Alignment is normal. Intervertebral disc spaces are maintained.  IMPRESSION: Negative.   Electronically Signed   By: Lahoma Crocker M.D.   On: 01/12/2015 16:39   Dg Abd Portable 2v  01/15/2015   CLINICAL DATA:  Abdominal pain and vomiting. History of breast cancer.  EXAM: PORTABLE ABDOMEN - 2 VIEW  COMPARISON:  None.  FINDINGS: The bowel gas pattern is normal. There is no evidence of free air. No radio-opaque calculi or other  significant radiographic abnormality is seen.  IMPRESSION: Negative.   Electronically Signed   By: Margarette Canada M.D.   On: 01/15/2015 21:28    Labs:  CBC:  Recent Labs  01/09/15 1851 01/12/15 1610 01/15/15 1324 01/16/15 0536  WBC 5.9 4.7 5.2 3.9*  HGB 13.8 14.1 13.9 10.3*  HCT 41.2 41.3 42.3 30.9*  PLT 357 317 352 222    COAGS:  Recent Labs  05/28/14 0935 01/15/15 2110  INR 1.01 1.28  APTT 27  --     BMP:  Recent Labs  01/04/15 0412  01/09/15 1851 01/12/15 1610  01/15/15 1324 01/16/15 0536  NA 139  < > 137 135 135* 140  K 3.7  < > 3.3* 3.3* 3.4* 3.1*  CL 106  --  96* 95*  --  107  CO2 21*  < > 26 27 25 24   GLUCOSE 100*  < > 142* 139* 137 137*  BUN 5*  < > 14 12 11.5 7  CALCIUM 7.0*  < > 8.1* 7.5* 7.7* 6.1*  CREATININE 0.65  < > 0.85 0.78 0.8 0.43*  GFRNONAA >60  --  >60 >60  --  >60  GFRAA >60  --  >60 >60  --  >60  < > = values in this interval not displayed.  LIVER FUNCTION TESTS:  Recent Labs  01/09/15 1851 01/12/15 1610 01/15/15 1324 01/16/15 0536  BILITOT 1.4* 1.3* 2.04* 1.4*  AST 126* 131* 188* 136*  ALT 105* 100* 119* 83*  ALKPHOS 276* 256* 311* 211*  PROT 8.7* 8.0 7.7 5.9*  ALBUMIN 3.7 3.4* 2.9* 2.4*    Assessment and Plan: Pt with past hx of sarcoid (prior liver bx 2014) and met breast cancer ; now with elevated LFT's. Request received for repeat US guided liver biopsy to r/o sarcoid vs breast ca. Imaging studies have been reviewed by Dr. Annamaria Boots. Risks and benefits discussed with the patient/husband including, but not limited to bleeding, infection, damage to adjacent structures or low yield requiring additional tests. All of the patient's questions were answered, patient is agreeable to proceed. Consent signed and in chart. Procedure tent planned for later today. Replace K.     Signed: D. Rowe Robert 01/16/2015, 9:40 AM   I spent a total of 15 minutes in face to face in clinical consultation/evaluation, greater than 50% of which was counseling/coordinating care for US guided liver biopsy

## 2015-01-17 ENCOUNTER — Other Ambulatory Visit: Payer: Self-pay | Admitting: *Deleted

## 2015-01-17 DIAGNOSIS — C50912 Malignant neoplasm of unspecified site of left female breast: Secondary | ICD-10-CM

## 2015-01-17 LAB — COMPREHENSIVE METABOLIC PANEL
ALT: 108 U/L — ABNORMAL HIGH (ref 14–54)
ANION GAP: 8 (ref 5–15)
AST: 155 U/L — ABNORMAL HIGH (ref 15–41)
Albumin: 2.5 g/dL — ABNORMAL LOW (ref 3.5–5.0)
Alkaline Phosphatase: 292 U/L — ABNORMAL HIGH (ref 38–126)
BILIRUBIN TOTAL: 1.2 mg/dL (ref 0.3–1.2)
BUN: 12 mg/dL (ref 6–20)
CO2: 26 mmol/L (ref 22–32)
Calcium: 8.8 mg/dL — ABNORMAL LOW (ref 8.9–10.3)
Chloride: 102 mmol/L (ref 101–111)
Creatinine, Ser: 0.64 mg/dL (ref 0.44–1.00)
Glucose, Bld: 156 mg/dL — ABNORMAL HIGH (ref 65–99)
POTASSIUM: 3.9 mmol/L (ref 3.5–5.1)
Sodium: 136 mmol/L (ref 135–145)
Total Protein: 6.4 g/dL — ABNORMAL LOW (ref 6.5–8.1)

## 2015-01-17 LAB — CBC
HEMATOCRIT: 33 % — AB (ref 36.0–46.0)
HEMOGLOBIN: 10.8 g/dL — AB (ref 12.0–15.0)
MCH: 26.5 pg (ref 26.0–34.0)
MCHC: 32.7 g/dL (ref 30.0–36.0)
MCV: 80.9 fL (ref 78.0–100.0)
PLATELETS: 234 10*3/uL (ref 150–400)
RBC: 4.08 MIL/uL (ref 3.87–5.11)
RDW: 15.8 % — ABNORMAL HIGH (ref 11.5–15.5)
WBC: 5.5 10*3/uL (ref 4.0–10.5)

## 2015-01-17 LAB — PTH, INTACT AND CALCIUM
Calcium, Total (PTH): 7.1 mg/dL — ABNORMAL LOW (ref 8.7–10.2)
PTH: 133 pg/mL — AB (ref 15–65)

## 2015-01-17 LAB — VITAMIN D 25 HYDROXY (VIT D DEFICIENCY, FRACTURES): VIT D 25 HYDROXY: 5.1 ng/mL — AB (ref 30.0–100.0)

## 2015-01-17 LAB — MAGNESIUM: Magnesium: 1.6 mg/dL — ABNORMAL LOW (ref 1.7–2.4)

## 2015-01-17 MED ORDER — ENSURE ENLIVE PO LIQD
237.0000 mL | Freq: Two times a day (BID) | ORAL | Status: DC
  Administered 2015-01-17: 237 mL via ORAL

## 2015-01-17 MED ORDER — ONDANSETRON 8 MG PO TBDP: 8.0000 mg | ORAL_TABLET | Freq: Three times a day (TID) | ORAL | Status: DC | PRN

## 2015-01-17 MED ORDER — PREDNISONE 20 MG PO TABS: 40.0000 mg | ORAL_TABLET | Freq: Every day | ORAL | Status: DC

## 2015-01-17 MED ORDER — POLYETHYLENE GLYCOL 3350 17 G PO PACK: 17.0000 g | PACK | Freq: Every day | ORAL | Status: AC | PRN

## 2015-01-17 MED ORDER — HEPARIN SOD (PORK) LOCK FLUSH 100 UNIT/ML IV SOLN
500.0000 [IU] | Freq: Once | INTRAVENOUS | Status: AC
  Administered 2015-01-17: 500 [IU] via INTRAVENOUS
  Filled 2015-01-17: qty 5

## 2015-01-17 MED ORDER — PREDNISONE 20 MG PO TABS
40.0000 mg | ORAL_TABLET | Freq: Every day | ORAL | Status: DC
  Filled 2015-01-17: qty 2

## 2015-01-17 MED ORDER — CALCIUM CARBONATE-VITAMIN D 500-200 MG-UNIT PO TABS: 2.0000 | ORAL_TABLET | Freq: Two times a day (BID) | ORAL | Status: AC

## 2015-01-17 MED ORDER — PANTOPRAZOLE SODIUM 40 MG PO TBEC: 40.0000 mg | DELAYED_RELEASE_TABLET | Freq: Every day | ORAL | Status: DC

## 2015-01-17 NOTE — Progress Notes (Signed)
Discharge instructions reviewed with pt. No current questions or concerns. Prescriptions given and pt instructed on when to call MD or come back to the ER. Pt will be leaving shortly once her ride arrives

## 2015-01-17 NOTE — Discharge Summary (Signed)
PATIENT DETAILS Name: Brenda Avery Age: 44 y.o. Sex: female Date of Birth: 06-08-1971 MRN: [id number]. Admitting Physician: Florencia Reasons, MD ERX:[id number] Moshe Cipro, MD  Admit Date: 01/15/2015 Discharge date: 01/17/2015  Recommendations for Outpatient Follow-up:  1. Liver biopsy pending at the time of discharge-please follow. 2. Angiotensin-converting enzyme levels pending at the time of discharge please follow 3. Has mild hypocalcemia-started on calcium and vitamin D supplementation. Please follow closely. 4. Please continue to follow LFTs very closely. 5. Please ensure follow-up with gastroenterology-have referred patient to Dr. 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 Hyperlipidemia     diet therapy per patient request  . 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 Hematuria 2000/2001    evaluted in [place] with negative results  . Breast CA     left  . S/P radiation therapy  05/10/2013-06/23/2013    Left chest wall / 50.4 Gray @ 1.8 Gray per fraction x 28 fractions/ Left Supraclavicular fossa / 69 Gray @1 .8 Gray per fraction x 25 fractions/ Left PAB / 45 Gy at 1.8 Gray per fraction x 25 fractions/Left scar / 10 Gray at [employer] per fraction x 5 fractions     . Status post chemotherapy      Radiation Therapy Radiosensitizing Xeloda  . Status post chemotherapy  09/26/2012 - 11/07/2012      Neoadjuvant chemotherapy consisting of 4 cycles of a.c. From 09/26/2012 through 11/07/2012 she then received 12 weeks of paclitaxel.  . Use of tamoxifen (Nolvadex) started 06/26/13  . Type II diabetes mellitus     diet therapy     DISCHARGE MEDICATIONS: Current Discharge Medication List    START  taking these medications   Details  calcium-vitamin D (OSCAL WITH D) 500-200 MG-UNIT per tablet Take 2 tablets by mouth 2 (two) times daily. Qty: 90 tablet, Refills: 0    pantoprazole (PROTONIX) 40 MG tablet Take 1 tablet (40 mg total) by mouth daily. Qty: 30 tablet, Refills: 0    polyethylene glycol (MIRALAX / GLYCOLAX) packet Take 17 g by mouth daily as needed for mild constipation. Qty: 14 each, Refills: 0    predniSONE (DELTASONE) 20 MG tablet Take 2 tablets (40 mg total) by mouth daily with breakfast. Qty: 60 tablet, Refills: 0      CONTINUE these medications which have CHANGED   Details  ondansetron (ZOFRAN-ODT) 8 MG disintegrating tablet Take 1 tablet (8 mg total) by mouth every 8 (eight) hours as needed for nausea or vomiting. Qty: 30 tablet, Refills: 0      CONTINUE these medications which have NOT CHANGED   Details  lidocaine-prilocaine (EMLA) cream Apply 1 application topically as needed. Qty: 30 g, Refills: 0    potassium chloride SA (K-DUR,KLOR-CON) 20 MEQ tablet Take 1 tablet (20 mEq total) by mouth 2 (two) times daily. Qty: 60 tablet, Refills: 1   Associated Diagnoses: Breast carcinoma metastatic to multiple sites, unspecified laterality    promethazine (PHENERGAN) 25 MG suppository Place 1 suppository (25 mg total) rectally 2 (two) times daily as needed for nausea (if unable to swallow oral anti-emetics). Qty: 30 suppository, Refills: 1   Associated Diagnoses: Carcinoma of  left breast metastatic to multiple sites    promethazine (PHENERGAN) 25 MG tablet Take 25 mg by mouth every 6 (six) hours as needed for nausea or vomiting.      STOP taking these medications     cephALEXin (KEFLEX) 500 MG capsule      oxyCODONE-acetaminophen (PERCOCET/ROXICET) 5-325 MG per tablet      palbociclib (IBRANCE) 75 MG capsule      methylPREDNISolone (MEDROL DOSEPAK) 4 MG TBPK tablet      ondansetron (ZOFRAN) 4 MG tablet         ALLERGIES:   Allergies  Allergen  Reactions  . Metronidazole Hives and Itching  . Morphine And Related Other (See Comments)    Patient prefers not to have because it is to strong.  . Sulfonamide Derivatives Hives, Itching and Rash    BRIEF HPI:  See H&P, Labs, Consult and Test reports for all details in brief, Brenda Avery is a 44 y.o. female with a Past Medical History of metastatic breast cancer stage IIIa last chemotherapy approximately 2 weeks back, known history of sarcoidosis of the liver who was referred as a direct admission from the cancer center for persistent nausea with vomiting and worsening LFTs.  CONSULTATIONS:   hematology/oncology   Interventional radiology.  PERTINENT RADIOLOGIC STUDIES: Dg Lumbar Spine Complete  01/12/2015   CLINICAL DATA:  Low back pain starting today, no trauma, history of breast cancer  EXAM: LUMBAR SPINE - COMPLETE 4+ VIEW  COMPARISON:  Sagittal view of the lumbar spine 12/03/2014.  FINDINGS: There is no evidence of lumbar spine fracture. Alignment is normal. Intervertebral disc spaces are maintained.  IMPRESSION: Negative.   Electronically Signed   By: Lahoma Crocker M.D.   On: 01/12/2015 16:39   Dg Abd 1 View  01/03/2015   CLINICAL DATA:  Nausea and vomiting. Upper abdominal pain. Symptoms for 3 days.  EXAM: ABDOMEN - 1 VIEW  COMPARISON:  CT 12/03/2014  FINDINGS: There is no evidence of free intra-abdominal air. No dilated bowel loops to suggest obstruction. Small volume of stool throughout the colon. No radiopaque calculi. Left tubal ligation clip is seen. There are pelvic phleboliths. No acute osseous abnormalities are seen.  IMPRESSION: Normal bowel gas pattern.   Electronically Signed   By: Jeb Levering M.D.   On: 01/03/2015 01:58   US Biopsy  01/16/2015   CLINICAL DATA:  Breast cancer and scleroderma, elevated LFTs and diffuse liver lesions. Previous liver biopsy was positive for scleroderma. Re-evaluate for metastatic disease  EXAM: ULTRASOUND GUIDED CORE BIOPSY OF RIGHT HEPATIC  LOBE  MEDICATIONS: 1.0 mg IV Versed; 50 mcg IV Fentanyl  Total Moderate Sedation Time: 15  PROCEDURE: The procedure, risks, benefits, and alternatives were explained to the patient. Questions regarding the procedure were encouraged and answered. The patient understands and consents to the procedure.  The RIGHT UPPER QUAD was prepped with ChloraPrep in a sterile fashion, and a sterile drape was applied covering the operative field. A sterile gown and sterile gloves were used for the procedure. Local anesthesia was provided with 1% Lidocaine.  Previous imaging reviewed. Preliminary ultrasound performed. Right hepatic lobe was localized in the right upper quadrant through a lower intercostal space in the mid axillary line. Diffuse nodular appearance of the liver noted. Under sterile conditions and local anesthesia, a 17 gauge 6.8 cm access needle was advanced percutaneously into the liver directed at a hypoechoic nodular mass. Several 18 gauge core biopsies obtained. Samples placed in formalin. Needle tract embolized  with Gel-Foam.  COMPLICATIONS: None immediate  FINDINGS: Imaging confirms needle placement in the right hepatic lobe nodular mass for core biopsy  IMPRESSION: Successful ultrasound right hepatic biopsy   Electronically Signed   By: Jerilynn Mages.  Shick M.D.   On: 01/16/2015 17:18   Dg Abd Portable 2v  01/15/2015   CLINICAL DATA:  Abdominal pain and vomiting. History of breast cancer.  EXAM: PORTABLE ABDOMEN - 2 VIEW  COMPARISON:  None.  FINDINGS: The bowel gas pattern is normal. There is no evidence of free air. No radio-opaque calculi or other significant radiographic abnormality is seen.  IMPRESSION: Negative.   Electronically Signed   By: Margarette Canada M.D.   On: 01/15/2015 21:28   US Abdomen Limited Ruq  01/03/2015   CLINICAL DATA:  Elevated liver function tests. History of breast cancer.  EXAM: US ABDOMEN LIMITED - RIGHT UPPER QUADRANT  COMPARISON:  CT scan of December 03, 2014.  FINDINGS: Gallbladder:  No  gallstones or wall thickening visualized. No sonographic Murphy sign noted.  Common bile duct:  Diameter: 3.7 mm which is within normal limits.  Liver:  Multiple hypoechoic rounded hypoechoic areas are noted throughout hepatic parenchyma consistent with metastatic disease. The largest measures 3.7 cm in diameter.  IMPRESSION: Findings consistent with multiple hepatic metastases. No gallstones or evidence of biliary obstruction is noted.   Electronically Signed   By: Marijo Conception, M.D.   On: 01/03/2015 08:58     PERTINENT LAB RESULTS: CBC:  Recent Labs  01/16/15 0536 01/17/15 0555  WBC 3.9* 5.5  HGB 10.3* 10.8*  HCT 30.9* 33.0*  PLT 222 234   CMET CMP     Component Value Date/Time   NA 136 01/17/2015 0555   NA 135* 01/15/2015 1324   K 3.9 01/17/2015 0555   K 3.4* 01/15/2015 1324   CL 102 01/17/2015 0555   CO2 26 01/17/2015 0555   CO2 25 01/15/2015 1324   GLUCOSE 156* 01/17/2015 0555   GLUCOSE 137 01/15/2015 1324   BUN 12 01/17/2015 0555   BUN 11.5 01/15/2015 1324   CREATININE 0.64 01/17/2015 0555   CREATININE 0.8 01/15/2015 1324   CALCIUM 8.8* 01/17/2015 0555   CALCIUM 7.1* 01/16/2015 1430   CALCIUM 7.7* 01/15/2015 1324   PROT 6.4* 01/17/2015 0555   PROT 7.7 01/15/2015 1324   ALBUMIN 2.5* 01/17/2015 0555   ALBUMIN 2.9* 01/15/2015 1324   AST 155* 01/17/2015 0555   AST 188* 01/15/2015 1324   ALT 108* 01/17/2015 0555   ALT 119* 01/15/2015 1324   ALKPHOS 292* 01/17/2015 0555   ALKPHOS 311* 01/15/2015 1324   BILITOT 1.2 01/17/2015 0555   BILITOT 2.04* 01/15/2015 1324   GFRNONAA >60 01/17/2015 0555   GFRAA >60 01/17/2015 0555    GFR Estimated Creatinine Clearance: 89.3 mL/min (by C-G formula based on Cr of 0.64).  Recent Labs  01/15/15 2110  LIPASE 60*   No results for input(s): CKTOTAL, CKMB, CKMBINDEX, TROPONINI in the last 72 hours. Invalid input(s): POCBNP No results for input(s): DDIMER in the last 72 hours. No results for input(s): HGBA1C in the last  72 hours. No results for input(s): CHOL, HDL, LDLCALC, TRIG, CHOLHDL, LDLDIRECT in the last 72 hours. No results for input(s): TSH, T4TOTAL, T3FREE, THYROIDAB in the last 72 hours.  Invalid input(s): FREET3 No results for input(s): VITAMINB12, FOLATE, FERRITIN, TIBC, IRON, RETICCTPCT in the last 72 hours. Coags:  Recent Labs  01/15/15 2110  INR 1.28   Microbiology: No results found for this  or any previous visit (from the past 240 hour(s)).   BRIEF HOSPITAL COURSE:  Elevated LFTs: Has persistently and worsening LFTs over the past few weeks. Recent hepatitis serology was negative. Since has a history of metastatic breast cancer and also has a history of liver sarcoidosis-not sure which is causing worsening of liver enzymes, oncology recommended a liver biopsy. Interventional radiology was consulted, patient underwent liver biopsy on 6/1, no complications. LFTs continue to be elevated. Have empirically started on prednisone, have spoken with Dr. Romie Minus on over the phone who has agreed to see the patient in the outpatient setting. Have left a message at his office-awaiting a call back. Patient stable to be discharged today, liver biopsy would be followed by oncology-spoke with Dr Lindi Adie on the phone.   Nausea with vomiting: Resolved with supportive measures. All within regular diet at the time of discharge. Abdomen is soft. Continue with as needed antiemetics. Hopefully steroids will help with some of the nausea and vomiting as well.  Dehydration: Secondary to above. Resolved with IV fluids  Hypokalemia: Repleted. Secondary to GI loss.  Hypocalcemia: Start vitamin D/calcium supplementation. PTH levels appropriately elevated, 1,25 vitamin D levels pending please follow. Please continue to monitor calcium levels closely in the outpatient setting.  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 Liver  biopsy done-results pending. Oncology to follow.  TODAY-DAY OF DISCHARGE:  Subjective:   Brenda Avery today has no headache,no chest abdominal pain,no new weakness tingling or numbness, feels much better wants to go home today.   Objective:   Blood pressure 115/67, pulse 83, temperature 98.6 F (37 C), temperature source Oral, resp. rate 20, height 5\' 4"  (1.626 m), weight 77.2 kg (170 lb 3.1 oz), SpO2 96 %.  Intake/Output Summary (Last 24 hours) at 01/17/15 1400 Last data filed at 01/17/15 0609  Gross per 24 hour  Intake 1162.5 ml  Output    450 ml  Net  712.5 ml   Filed Weights   01/16/15 0555  Weight: 77.2 kg (170 lb 3.1 oz)    Exam Awake Alert, Oriented *3, No new F.N deficits, Normal affect [place].AT,PERRAL Supple Neck,No JVD, No cervical lymphadenopathy appriciated.  Symmetrical Chest wall movement, Good air movement bilaterally, CTAB RRR,No Gallops,Rubs or new Murmurs, No Parasternal Heave +ve B.Sounds, Abd Soft, Non tender, No organomegaly appriciated, No rebound -guarding or rigidity. No Cyanosis, Clubbing or edema, No new Rash or bruise  DISCHARGE CONDITION: Stable  DISPOSITION: Home  DISCHARGE INSTRUCTIONS:    Activity:  As tolerated  Diet recommendation: Regular Diet  Discharge Instructions    Call MD for:  persistant nausea and vomiting    Complete by:  As directed      Call MD for:  severe uncontrolled pain    Complete by:  As directed      Diet general    Complete by:  As directed      Increase activity slowly    Complete by:  As directed            Follow-up Information    Follow up with [name], MD. Schedule an appointment as soon as possible for a visit in 1 week.   Specialty:  Family Medicine   Contact information:   [address] [place] [address] [phone]       Follow up with Rogene Houston, MD.   Specialty:  Gastroenterology   Why:  Call office  for a appointment. Dr Rehman(gastroenterologist)is aware,  please let the office know that the MD in [place] has spoke with Dr Laural Golden who had agreed to see patient.    Contact information:   [address] [place] [address] [phone]       Follow up with Mikey Bussing, NP On 01/21/2015.   Specialty:  Oncology   Why:  appointment at 11:15 am   Contact information:   [address] [place] [address] [phone]      Total Time spent on discharge equals 45 minutes.  SignedOren Binet 01/17/2015 2:00 PM

## 2015-01-17 NOTE — Progress Notes (Signed)
Patient c/o LBM Sunday and bloating. Requesting a laxative. Paged NP and orders received.

## 2015-01-18 ENCOUNTER — Ambulatory Visit: Payer: BLUE CROSS/BLUE SHIELD

## 2015-01-18 ENCOUNTER — Telehealth: Payer: Self-pay | Admitting: Hematology and Oncology

## 2015-01-18 ENCOUNTER — Ambulatory Visit: Payer: BLUE CROSS/BLUE SHIELD | Admitting: Physician Assistant

## 2015-01-18 NOTE — Telephone Encounter (Signed)
Per 06/02 POF, added labs/ov tried to leave msg for pt but states vm full so I left my contact # on pager for pt to call back, never heard from pt called again today and left my call back # for pt's apt on 06/06.... KJ

## 2015-01-19 LAB — ANGIOTENSIN CONVERTING ENZYME: ANGIOTENSIN-CONVERTING ENZYME: 251 U/L — AB (ref 14–82)

## 2015-01-21 ENCOUNTER — Encounter (INDEPENDENT_AMBULATORY_CARE_PROVIDER_SITE_OTHER): Payer: Self-pay | Admitting: Internal Medicine

## 2015-01-21 ENCOUNTER — Telehealth: Payer: Self-pay | Admitting: Hematology and Oncology

## 2015-01-21 ENCOUNTER — Other Ambulatory Visit (HOSPITAL_BASED_OUTPATIENT_CLINIC_OR_DEPARTMENT_OTHER): Payer: BLUE CROSS/BLUE SHIELD

## 2015-01-21 ENCOUNTER — Ambulatory Visit (HOSPITAL_BASED_OUTPATIENT_CLINIC_OR_DEPARTMENT_OTHER): Payer: BLUE CROSS/BLUE SHIELD

## 2015-01-21 ENCOUNTER — Encounter: Payer: Self-pay | Admitting: Oncology

## 2015-01-21 ENCOUNTER — Ambulatory Visit (HOSPITAL_BASED_OUTPATIENT_CLINIC_OR_DEPARTMENT_OTHER): Payer: BLUE CROSS/BLUE SHIELD | Admitting: Oncology

## 2015-01-21 ENCOUNTER — Ambulatory Visit (INDEPENDENT_AMBULATORY_CARE_PROVIDER_SITE_OTHER): Payer: BLUE CROSS/BLUE SHIELD | Admitting: Internal Medicine

## 2015-01-21 VITALS — BP 128/83 | HR 99 | Temp 98.6°F | Resp 20

## 2015-01-21 VITALS — BP 102/72 | HR 136 | Temp 98.3°F | Ht 64.0 in | Wt 172.8 lb

## 2015-01-21 VITALS — BP 130/87 | HR 137 | Temp 98.2°F | Resp 19 | Ht 64.0 in | Wt 170.0 lb

## 2015-01-21 DIAGNOSIS — E860 Dehydration: Secondary | ICD-10-CM

## 2015-01-21 DIAGNOSIS — R000 Tachycardia, unspecified: Secondary | ICD-10-CM

## 2015-01-21 DIAGNOSIS — C778 Secondary and unspecified malignant neoplasm of lymph nodes of multiple regions: Secondary | ICD-10-CM

## 2015-01-21 DIAGNOSIS — D8689 Sarcoidosis of other sites: Secondary | ICD-10-CM

## 2015-01-21 DIAGNOSIS — R112 Nausea with vomiting, unspecified: Secondary | ICD-10-CM

## 2015-01-21 DIAGNOSIS — C787 Secondary malignant neoplasm of liver and intrahepatic bile duct: Secondary | ICD-10-CM

## 2015-01-21 DIAGNOSIS — C50912 Malignant neoplasm of unspecified site of left female breast: Secondary | ICD-10-CM

## 2015-01-21 DIAGNOSIS — C7951 Secondary malignant neoplasm of bone: Secondary | ICD-10-CM

## 2015-01-21 DIAGNOSIS — D869 Sarcoidosis, unspecified: Secondary | ICD-10-CM

## 2015-01-21 DIAGNOSIS — R945 Abnormal results of liver function studies: Secondary | ICD-10-CM

## 2015-01-21 DIAGNOSIS — Z452 Encounter for adjustment and management of vascular access device: Secondary | ICD-10-CM

## 2015-01-21 DIAGNOSIS — R7989 Other specified abnormal findings of blood chemistry: Secondary | ICD-10-CM

## 2015-01-21 LAB — COMPREHENSIVE METABOLIC PANEL (CC13)
ALT: 116 U/L — AB (ref 0–55)
ANION GAP: 12 meq/L — AB (ref 3–11)
AST: 151 U/L — ABNORMAL HIGH (ref 5–34)
Albumin: 2.4 g/dL — ABNORMAL LOW (ref 3.5–5.0)
Alkaline Phosphatase: 381 U/L — ABNORMAL HIGH (ref 40–150)
BILIRUBIN TOTAL: 2.31 mg/dL — AB (ref 0.20–1.20)
BUN: 15.4 mg/dL (ref 7.0–26.0)
CHLORIDE: 97 meq/L — AB (ref 98–109)
CO2: 25 mEq/L (ref 22–29)
Calcium: 9.7 mg/dL (ref 8.4–10.4)
Creatinine: 1.1 mg/dL (ref 0.6–1.1)
EGFR: 73 mL/min/{1.73_m2} — AB (ref >90)
GLUCOSE: 196 mg/dL — AB (ref 70–140)
Potassium: 4.3 mEq/L (ref 3.5–5.1)
Sodium: 134 mEq/L — ABNORMAL LOW (ref 136–145)
Total Protein: 7 g/dL (ref 6.4–8.3)

## 2015-01-21 LAB — CBC WITH DIFFERENTIAL/PLATELET
BASO%: 0.5 % (ref 0.0–2.0)
BASOS ABS: 0 10*3/uL (ref 0.0–0.1)
EOS ABS: 0 10*3/uL (ref 0.0–0.5)
EOS%: 0.4 % (ref 0.0–7.0)
HCT: 39.1 % (ref 34.8–46.6)
HGB: 12.9 g/dL (ref 11.6–15.9)
LYMPH%: 9.2 % — ABNORMAL LOW (ref 14.0–49.7)
MCH: 27 pg (ref 25.1–34.0)
MCHC: 32.9 g/dL (ref 31.5–36.0)
MCV: 82.1 fL (ref 79.5–101.0)
MONO#: 0.5 10*3/uL (ref 0.1–0.9)
MONO%: 6.1 % (ref 0.0–14.0)
NEUT#: 7.3 10*3/uL — ABNORMAL HIGH (ref 1.5–6.5)
NEUT%: 83.8 % — ABNORMAL HIGH (ref 38.4–76.8)
Platelets: 282 10*3/uL (ref 145–400)
RBC: 4.77 10*6/uL (ref 3.70–5.45)
RDW: 16 % — AB (ref 11.2–14.5)
WBC: 8.7 10*3/uL (ref 3.9–10.3)
lymph#: 0.8 10*3/uL — ABNORMAL LOW (ref 0.9–3.3)

## 2015-01-21 LAB — PROTHROMBIN TIME
INR: 1.21 (ref <1.50)
Prothrombin Time: 15.3 seconds — ABNORMAL HIGH (ref 11.6–15.2)

## 2015-01-21 MED ORDER — SODIUM CHLORIDE 0.9 % IV SOLN
12.0000 mg | Freq: Once | INTRAVENOUS | Status: AC
  Administered 2015-01-21: 12 mg via INTRAVENOUS
  Filled 2015-01-21: qty 1.2

## 2015-01-21 MED ORDER — PROMETHAZINE HCL 25 MG/ML IJ SOLN
12.5000 mg | Freq: Once | INTRAMUSCULAR | Status: AC
  Administered 2015-01-21: 12.5 mg via INTRAVENOUS
  Filled 2015-01-21: qty 1

## 2015-01-21 MED ORDER — SODIUM CHLORIDE 0.9 % IJ SOLN
10.0000 mL | Freq: Once | INTRAMUSCULAR | Status: AC
  Administered 2015-01-21: 10 mL via INTRAVENOUS
  Filled 2015-01-21: qty 10

## 2015-01-21 MED ORDER — DEXAMETHASONE 4 MG PO TABS: 4.0000 mg | ORAL_TABLET | Freq: Four times a day (QID) | ORAL | Status: DC

## 2015-01-21 MED ORDER — SODIUM CHLORIDE 0.9 % IV SOLN
INTRAVENOUS | Status: DC
  Administered 2015-01-21: 12:00:00 via INTRAVENOUS

## 2015-01-21 MED ORDER — HEPARIN SOD (PORK) LOCK FLUSH 100 UNIT/ML IV SOLN
500.0000 [IU] | Freq: Once | INTRAVENOUS | Status: AC
  Administered 2015-01-21: 500 [IU] via INTRAVENOUS
  Filled 2015-01-21: qty 5

## 2015-01-21 NOTE — Patient Instructions (Addendum)
Cmet today. Go directly to the ED

## 2015-01-21 NOTE — Telephone Encounter (Signed)
Gave pt/relative avs report and appointments for June.

## 2015-01-21 NOTE — Progress Notes (Signed)
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 She also has suspicious bone lesions but not easily accessible acc.to IR.  Therefore we are giving her the benefit of the doubt and treating her with curative intent. MRI suggests nodal involvement in the axilla.        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 Liver metastases   09/14/2012 Cancer Staging PET scan- multiple hypermetabolic areas left pectoral node the breast mass, left hilar, suprahilar, right hilar, right suprahilar, subcarinal lymph node, liver, right iliac, bilateral inguinal nodes, sternum lytic lesion    09/19/2012 Procedure Liver biopsy- Sarcoidosis   09/26/2012 - 01/10/2013 Chemotherapy AC x 4 followed by weekly Paclitaxel x 12. Marker response supraclavicular and mediastinal nodes decreasing the breast mass and liver lesions resolution of sternal activity   04/10/2013 Surgery Bilateral mastectomies, left decidual IDC grade 2 x 0.7 cm with high-grade DCIS LV I 8/11 lymph nodes positive with extracapsular extension ER 99% PR 40%, HER-2 negative. R: No cancer   04/12/2013 Procedure Genetic testing did not reveal BRCA mutations   05/22/2013 - 06/23/2013 Radiation Therapy Adjuvant radiation therapy with Xeloda   06/23/2013 -  Anti-estrogen oral therapy Tamoxifen 20 mg daily with Zoladex every 3 months   05/28/2014 Relapse/Recurrence Bony metastatic disease: Sternum biopsy metastatic carcinoma, HER-2 negative ratio 0.74   06/15/2014 -  Anti-estrogen oral therapy Ibrance, faslodex, xgeva    CHIEF COMPLIANT: Profound dehydration, intractable nausea and vomiting  INTERVAL HISTORY: Brenda Avery is a 44 year old with above-mentioned history of metastatic breast cancer. Recently hospitalized for weakness, nausea, vomiting, elevated LFTs. She was most recently been treated with Leslee Home with Faslodex and Xgeva, but treatment currently on hold. Based on a previous liver biopsy showing sarcoidosis, we attributed these findings in the liver to sarcoidosis. She is on Prednisone 40 mg daily. She really does not feel much better. She cannot drink or eat anything. Denies pain, but states that she is uncomfortable in her abdomen over her liver. Denies chest pain, SOB. DOE. Uses Phenergan for nausea, but has not taken any this morning. Denies bleeding and jaundice. Was seen by GI this am and was instructed to go to ER due to dehydration and orthostatic hypotension. Instead, she has come here as she wants to avoid the hospital. Requests IVF here today.  REVIEW OF SYSTEMS:   Constitutional: She appears dehydrated with sunken eyeballs Eyes: Denies blurriness of vision Ears, nose, mouth, throat, and face: Denies mucositis or sore throat Respiratory: Denies cough, dyspnea or wheezes Cardiovascular: Denies palpitation, chest discomfort or lower extremity swelling Gastrointestinal: Intractable nausea and vomiting. No appetite.  Skin: Denies abnormal skin rashes Lymphatics: Denies new lymphadenopathy or easy bruising Neurological:Denies numbness, tingling  or new weaknesses Behavioral/Psych: Very sad and depressed appearing  All other systems were reviewed with the patient and are negative.  I have reviewed the past medical  history, past surgical history, social history and family history with the patient and they are unchanged from previous note.  ALLERGIES:  is allergic to metronidazole; morphine and related; and sulfonamide derivatives.  MEDICATIONS:  Current Outpatient Prescriptions  Medication Sig Dispense Refill  . calcium-vitamin D (OSCAL WITH D) 500-200 MG-UNIT per tablet Take 2 tablets by mouth 2 (two) times daily. 90 tablet 0  . lidocaine-prilocaine (EMLA) cream Apply 1 application topically as needed. 30 g 0  . ondansetron (ZOFRAN-ODT) 8 MG disintegrating tablet Take 1 tablet (8 mg total) by mouth every 8 (eight) hours as needed for nausea or vomiting. 30 tablet 0  . pantoprazole (PROTONIX) 40 MG tablet Take 1 tablet (40 mg total) by mouth daily. 30 tablet 0  . polyethylene glycol (MIRALAX / GLYCOLAX) packet Take 17 g by mouth daily as needed for mild constipation. 14 each 0  . potassium chloride SA (K-DUR,KLOR-CON) 20 MEQ tablet Take 1 tablet (20 mEq total) by mouth 2 (two) times daily. 60 tablet 1  . predniSONE (DELTASONE) 20 MG tablet Take 2 tablets (40 mg total) by mouth daily with breakfast. 60 tablet 0  . promethazine (PHENERGAN) 25 MG suppository Place 1 suppository (25 mg total) rectally 2 (two) times daily as needed for nausea (if unable to swallow oral anti-emetics). 30 suppository 1  . promethazine (PHENERGAN) 25 MG tablet Take 25 mg by mouth every 6 (six) hours as needed for nausea or vomiting.    Marland Kitchen dexamethasone (DECADRON) 4 MG tablet Take 1 tablet (4 mg total) by mouth 4 (four) times daily. 120 tablet 0   No current facility-administered medications for this visit.    PHYSICAL EXAMINATION: ECOG PERFORMANCE STATUS: 3 - Symptomatic, >50% confined to bed  Filed Vitals:   01/21/15 1109  BP: 130/87  Pulse: 137  Temp: 98.2 F (36.8 C)  Resp: 19   Filed Weights   01/21/15 1109  Weight: 170 lb (77.111 kg)    GENERAL:alert, Appears uncomfortable at times and needs to change position  to get comfortable. SKIN: skin color, texture, turgor are normal, no rashes or significant lesions EYES: normal, Conjunctiva are pink and non-injected, sclera clear OROPHARYNX:no exudate, no erythema and lips, buccal mucosa, and tongue normal  NECK: supple, thyroid normal size, non-tender, without nodularity LYMPH:  no palpable lymphadenopathy in the cervical, axillary or inguinal LUNGS: clear to auscultation and percussion with normal breathing effort HEART: Tachycardia ABDOMEN: Moderate distention of the belly. Liver edge palpable ~ 4  fingerbreadths below the right costal margin. Musculoskeletal:no cyanosis of digits and no clubbing  NEURO: alert & oriented x 3 with fluent speech, no focal motor/sensory deficits  LABORATORY DATA:  I have reviewed the data as listed   Chemistry      Component Value Date/Time   NA 134* 01/21/2015 1054   NA 136 01/17/2015 0555   K 4.3 01/21/2015 1054   K 3.9 01/17/2015 0555   CL 102 01/17/2015 0555   CO2 25 01/21/2015 1054   CO2 26 01/17/2015 0555   BUN 15.4 01/21/2015 1054   BUN 12 01/17/2015 0555   CREATININE 1.1 01/21/2015 1054   CREATININE 0.64 01/17/2015 0555      Component Value Date/Time   CALCIUM 9.7 01/21/2015 1054   CALCIUM 8.8* 01/17/2015 0555   CALCIUM 7.1* 01/16/2015 1430   ALKPHOS 381* 01/21/2015  1054   ALKPHOS 292* 01/17/2015 0555   AST 151* 01/21/2015 1054   AST 155* 01/17/2015 0555   ALT 116* 01/21/2015 1054   ALT 108* 01/17/2015 0555   BILITOT 2.31* 01/21/2015 1054   BILITOT 1.2 01/17/2015 0555       Lab Results  Component Value Date   WBC 8.7 01/21/2015   HGB 12.9 01/21/2015   HCT 39.1 01/21/2015   MCV 82.1 01/21/2015   PLT 282 01/21/2015   NEUTROABS 7.3* 01/21/2015   ASSESSMENT & PLAN:  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 Patient most recently on  Ibrance, Faslodex, Xgeva that started 06/15/14. Currently on hold.  CT scans and Bone scan 12/03/14: Non PET avid MediastimalLN, Multifocal Liver lesions, ? Sarcoidosis in spleen, Progression of abdominal/ retroperitoneal LN, Sternum sclerotic lesion, Pelvic mets.  Hospitalization 5/18-5/20 & 01/15/15-01/17/15: Nausea, vomiting, Diarrhea, dehydration  Plan: 1. Patient is profoundly dehydrated with tachycardia. IVF here today. Will give IV Dexamethasone and Phenergan IV as well. Will plan to give her IVF approximately 2 times a week (Tuesday/Thursday) 2. Blood work returned back with an AST of 151 ALT of 116 and a bilirubin of 2.31. Most recent liver biopsy is pending. Previous liver biopsy showed sarcoidosis. D/C Prednisone and begin Dexamethasone 4 mg QID. Follow-up on liver biopsy results when available.  3. Intractable nausea and vomiting: Has a prescription for Phenergan suppositories. Patient was not able to keep any oral antinausea drugs.  4. Sarcoidosis: I recommend getting a rheumatology evaluation to assist with management of her complex clinical condition.  Recheck labs on Friday of this week with IVF. We will see her back next week on th same day as IVF. In the interim until she feels better, I recommended holding off further treatment with Ibrance or Faslodex.  Patient reviewed with Dr. Lindi Adie. She was instructed to go to the ED if she worsens before her next visit.   No orders of the defined types were placed in this encounter.   The patient has a good understanding of the overall plan. she agrees with it. she will call with any problems that may develop before the next visit here.   Mikey Bussing, NP   Addendum: Liver biopsy results returned after the patient left our office. Biopsy consistent with metastatic breast cancer. Per discussion with Dr.  Lindi Adie, the patient needs systemic chemotherapy. Since she lives in [place], Dr. Lindi Adie plans to reach out the the [facility] to get  the patient established there. Once she establishes with them, then we can cancel future appointments for her here in [place].

## 2015-01-21 NOTE — Patient Instructions (Signed)
Dehydration, Adult Dehydration is when you lose more fluids from the body than you take in. Vital organs like the kidneys, brain, and heart cannot function without a proper amount of fluids and salt. Any loss of fluids from the body can cause dehydration.  CAUSES   Vomiting.  Diarrhea.  Excessive sweating.  Excessive urine output.  Fever. SYMPTOMS  Mild dehydration  Thirst.  Dry lips.  Slightly dry mouth. Moderate dehydration  Very dry mouth.  Sunken eyes.  Skin does not bounce back quickly when lightly pinched and released.  Dark urine and decreased urine production.  Decreased tear production.  Headache. Severe dehydration  Very dry mouth.  Extreme thirst.  Rapid, weak pulse (more than 100 beats per minute at rest).  Cold hands and feet.  Not able to sweat in spite of heat and temperature.  Rapid breathing.  Blue lips.  Confusion and lethargy.  Difficulty being awakened.  Minimal urine production.  No tears. DIAGNOSIS  Your caregiver will diagnose dehydration based on your symptoms and your exam. Blood and urine tests will help confirm the diagnosis. The diagnostic evaluation should also identify the cause of dehydration. TREATMENT  Treatment of mild or moderate dehydration can often be done at home by increasing the amount of fluids that you drink. It is best to drink small amounts of fluid more often. Drinking too much at one time can make vomiting worse. Refer to the home care instructions below. Severe dehydration needs to be treated at the hospital where you will probably be given intravenous (IV) fluids that contain water and electrolytes. HOME CARE INSTRUCTIONS   Ask your caregiver about specific rehydration instructions.  Drink enough fluids to keep your urine clear or pale yellow.  Drink small amounts frequently if you have nausea and vomiting.  Eat as you normally do.  Avoid:  Foods or drinks high in sugar.  Carbonated  drinks.  Juice.  Extremely hot or cold fluids.  Drinks with caffeine.  Fatty, greasy foods.  Alcohol.  Tobacco.  Overeating.  Gelatin desserts.  Wash your hands well to avoid spreading bacteria and viruses.  Only take over-the-counter or prescription medicines for pain, discomfort, or fever as directed by your caregiver.  Ask your caregiver if you should continue all prescribed and over-the-counter medicines.  Keep all follow-up appointments with your caregiver. SEEK MEDICAL CARE IF:  You have abdominal pain and it increases or stays in one area (localizes).  You have a rash, stiff neck, or severe headache.  You are irritable, sleepy, or difficult to awaken.  You are weak, dizzy, or extremely thirsty. SEEK IMMEDIATE MEDICAL CARE IF:   You are unable to keep fluids down or you get worse despite treatment.  You have frequent episodes of vomiting or diarrhea.  You have blood or green matter (bile) in your vomit.  You have blood in your stool or your stool looks black and tarry.  You have not urinated in 6 to 8 hours, or you have only urinated a small amount of very dark urine.  You have a fever.  You faint. MAKE SURE YOU:   Understand these instructions.  Will watch your condition.  Will get help right away if you are not doing well or get worse. Document Released: 08/03/2005 Document Revised: 10/26/2011 Document Reviewed: 03/23/2011 ExitCare Patient Information 2015 [employer]. This information is not intended to replace advice given to you by your health care provider. Make sure you discuss any questions you have with your health care   provider.  

## 2015-01-21 NOTE — Progress Notes (Addendum)
Subjective:    Patient ID: Brenda Avery, female    DOB: Nov 26, 1970, 44 y.o.   MRN: [id number]  HPI Here today as a new patient. Hx of Metastatic breast cancer stage 111a. and underwent a rt total mastectomy and left modified radical mastectomy in 2014.  Recently admitted to [facility] with for elevated liver enzymes, nausea and vomiting, dehydrateion. . She is followed by Dr. Dorothea Ogle at the [facility] at [place].   She was found to have elevated LFTs and underwent a liver biopsy which revealed metastatic carcinoma of breast primary source on 01/16/2015. She was started on prednisone 40mg  daily while in hospital. She has been on this 01/19/2015 Last chemo 3 weeks. Leslee Home, Faslodex and Delton See). She had been on this since October. She says she still feels tired. She is eating as much as she can. She has been like this for a bout a month. She may throw up once a day. She is trying to eat at least 3 meals a day. She says she does not have an appetite for anything. She is drinking Ensure twice a day.  Weight 01/17/2015 170. Today her weight is 172.8.     Recent liver biopsy 01/16/2015 per Dr. Laural Golden revealed metastatic carcinoma of breast primary source. 09/19/2012 Liver biopsy: Granulomatous inflammation, non-necorotizing. No malignancy identified. There are multiple predominantly portal based non-necorotizing granulomas and the pattern suggests sarcoidosis.    04/10/2013  Breast carcinoma: Dr. Fanny Skates: Right total mastectomy Right axillary sentinel node biopsy Left modified radical mastectomy  CMP Latest Ref Rng 01/17/2015 01/16/2015 01/16/2015  Glucose 65 - 99 mg/dL 156(H) 137(H) -  BUN 6 - 20 mg/dL 12 7 -  Creatinine 0.44 - 1.00 mg/dL 0.64 0.43(L) -  Sodium 135 - 145 mmol/L 136 140 -  Potassium 3.5 - 5.1 mmol/L 3.9 3.1(L) -  Chloride 101 - 111 mmol/L 102 107 -  CO2 22 - 32 mmol/L 26 24 -  Calcium 8.9 - 10.3 mg/dL 8.8(L) 7.1(L) 6.1(LL)  Total Protein 6.5 - 8.1 g/dL 6.4(L) 5.9(L) -  Total  Bilirubin 0.3 - 1.2 mg/dL 1.2 1.4(H) -  Alkaline Phos 38 - 126 U/L 292(H) 211(H) -  AST 15 - 41 U/L 155(H) 136(H) -  ALT 14 - 54 U/L 108(H) 83(H) -      Review of Systems Married, two children in good health.     Objective:   Physical Exam Blood pressure 102/72, pulse 116, temperature 98.3 F (36.8 C), height 5\' 4"  (0.998 m), weight 172 lb 12.8 oz (78.382 kg). Alert and oriented. Skin warm and dry. Oral mucosa is moist.   . Sclera anicteric, conjunctivae is pink. Thyroid not enlarged. No cervical lymphadenopathy. Lungs clear. Heart rate is rapid (136). Abdomen is soft. Bowel sounds are positive. Diffuse tenderness.  Liver 4 fingerbreadths below costal margin.  No abdominal masses felt.  edema to lower extremities. Patient appears very weak.         Assessment & Plan:  Elevated liver enzymes/metastatic carcinoma of liver with primary source breast: Presently taking Prednisione 40mg . Dehydration: I spoke with Hospitalist services. She is to go thru the ED due to patient being orthostatic. Patient appears very weak lying on bench in room. She is tachycardiac.    Husband states he is going to [facility] ED. He does not want to go to [facility]. He has OV at [facility] today and I advised him to let the Cancer know that he was at [facility].

## 2015-01-22 ENCOUNTER — Telehealth (HOSPITAL_COMMUNITY): Payer: Self-pay | Admitting: Hematology & Oncology

## 2015-01-22 ENCOUNTER — Other Ambulatory Visit: Payer: Self-pay | Admitting: *Deleted

## 2015-01-22 ENCOUNTER — Telehealth: Payer: Self-pay | Admitting: Hematology and Oncology

## 2015-01-22 ENCOUNTER — Encounter (HOSPITAL_COMMUNITY): Payer: BLUE CROSS/BLUE SHIELD | Attending: Hematology & Oncology | Admitting: Hematology & Oncology

## 2015-01-22 ENCOUNTER — Encounter (HOSPITAL_COMMUNITY): Payer: Self-pay | Admitting: Hematology & Oncology

## 2015-01-22 ENCOUNTER — Ambulatory Visit: Payer: BLUE CROSS/BLUE SHIELD

## 2015-01-22 VITALS — BP 136/86 | HR 99 | Temp 97.7°F | Resp 16 | Wt 177.0 lb

## 2015-01-22 DIAGNOSIS — C799 Secondary malignant neoplasm of unspecified site: Secondary | ICD-10-CM | POA: Insufficient documentation

## 2015-01-22 DIAGNOSIS — C787 Secondary malignant neoplasm of liver and intrahepatic bile duct: Secondary | ICD-10-CM

## 2015-01-22 DIAGNOSIS — C50912 Malignant neoplasm of unspecified site of left female breast: Secondary | ICD-10-CM | POA: Insufficient documentation

## 2015-01-22 DIAGNOSIS — C7951 Secondary malignant neoplasm of bone: Secondary | ICD-10-CM | POA: Diagnosis not present

## 2015-01-22 DIAGNOSIS — R591 Generalized enlarged lymph nodes: Secondary | ICD-10-CM

## 2015-01-22 LAB — VITAMIN D 1,25 DIHYDROXY
Vitamin D 1, 25 (OH)2 Total: 124 pg/mL
Vitamin D2 1, 25 (OH)2: <10 pg/mL
Vitamin D3 1, 25 (OH)2: 115 pg/mL

## 2015-01-22 MED ORDER — PROMETHAZINE HCL 25 MG PO TABS: 25.0000 mg | ORAL_TABLET | Freq: Four times a day (QID) | ORAL | Status: AC | PRN

## 2015-01-22 MED ORDER — FENTANYL 12 MCG/HR TD PT72: 12.5000 ug | MEDICATED_PATCH | TRANSDERMAL | Status: DC

## 2015-01-22 NOTE — Telephone Encounter (Signed)
Appointments cancelled per pof   anne

## 2015-01-22 NOTE — Patient Instructions (Addendum)
[place] at [facility] Discharge Instructions  RECOMMENDATIONS MADE BY THE CONSULTANT AND ANY TEST RESULTS WILL BE SENT TO YOUR REFERRING PHYSICIAN.  Chemo scheduled for Friday @ 10:30  Hildred Alamin to call on Thursday @ 10 for teaching. [phone])  Fentanyl patch 78mcg. Apply 1 patch to skin every 72 hours. Remove old patch before applying new patch. After 24 hours if pain is not improved, take off old patch and throw away. Place 2 new patches to skin. Let us know if the patch is working or not.   You may take Hydrocodone for breakthrough pain in addition to pain patch.   Phenergan pills called into [place].     Thank you for choosing [place] at [facility] to provide your oncology and hematology care.  To afford each patient quality time with our provider, please arrive at least 15 minutes before your scheduled appointment time.    You need to re-schedule your appointment should you arrive 10 or more minutes late.  We strive to give you quality time with our providers, and arriving late affects you and other patients whose appointments are after yours.  Also, if you no show three or more times for appointments you may be dismissed from the clinic at the providers discretion.     Again, thank you for choosing [facility].  Our hope is that these requests will decrease the amount of time that you wait before being seen by our physicians.       _____________________________________________________________  Should you have questions after your visit to [facility], please contact our office at [phone] between the hours of 8:30 a.m. and 4:30 p.m.  Voicemails left after 4:30 p.m. will not be returned until the following business day.  For prescription refill requests, have your pharmacy contact our office.   Eribulin solution for injection What is this medicine? ERIBULIN is a chemotherapy drug. It is  used to treat breast cancer. This medicine may be used for other purposes; ask your health care provider or pharmacist if you have questions. COMMON BRAND NAME(S): [place] What should I tell my health care provider before I take this medicine? They need to know if you have any of these conditions: -heart disease -kidney disease -liver disease -low blood counts, like low white cell, platelet, or red cell counts -an unusual or allergic reaction to eribulin, other medicines, foods, dyes, or preservatives -pregnant or trying to get pregnant -breast-feeding How should I use this medicine? This medicine is for infusion into a vein. It is given by a health care professional in a hospital or clinic setting. Talk to your pediatrician regarding the use of this medicine in children. Special care may be needed. Overdosage: If you think you've taken too much of this medicine contact a poison control center or emergency room at once. Overdosage: If you think you have taken too much of this medicine contact a poison control center or emergency room at once. NOTE: This medicine is only for you. Do not share this medicine with others. What if I miss a dose? It is important not to miss your dose. Call your doctor or health care professional if you are unable to keep an appointment. What may interact with this medicine? Do not take this medicine with any of the following medications: -amiodarone -astemizole -arsenic trioxide -bepridil -bretylium -chloroquine -chlorpromazine -cisapride -clarithromycin -dextromethorphan, quinidine -disopyramide -dofetilide -droperidol -dronedarone -erythromycin -grepafloxacin -halofantrine -haloperidol -ibutilide -levomethadyl -  mesoridazine -methadone -pentamidine -procainamide -quinidine -pimozide -posaconazole -probucol -propafenone -saquinavir -sotalol -sparfloxacin -terfenadine -thioridazine -troleandomycin -ziprasidone This list may not  describe all possible interactions. Give your health care provider a list of all the medicines, herbs, non-prescription drugs, or dietary supplements you use. Also tell them if you smoke, drink alcohol, or use illegal drugs. Some items may interact with your medicine. What should I watch for while using this medicine? Your condition will be monitored carefully while you are receiving this medicine. This drug may make you feel generally unwell. This is not uncommon, as chemotherapy can affect healthy cells as well as cancer cells. Report any side effects. Continue your course of treatment even though you feel ill unless your doctor tells you to stop. Call your doctor or health care professional for advice if you get a fever, chills or sore throat, or other symptoms of a cold or flu. Do not treat yourself. This drug decreases your body's ability to fight infections. Try to avoid being around people who are sick. This medicine may increase your risk to bruise or bleed. Call your doctor or health care professional if you notice any unusual bleeding. Be careful brushing and flossing your teeth or using a toothpick because you may get an infection or bleed more easily. If you have any dental work done, tell your dentist you are receiving this medicine. Avoid taking products that contain aspirin, acetaminophen, ibuprofen, naproxen, or ketoprofen unless instructed by your doctor. These medicines may hide a fever. Do not become pregnant while taking this medicine. Women should inform their doctor if they wish to become pregnant or think they might be pregnant. There is a potential for serious side effects to an unborn child. Talk to your health care professional or pharmacist for more information. Do not breast-feed an infant while taking this medicine. What side effects may I notice from receiving this medicine? Side effects that you should report to your doctor or health care professional as soon as  possible: -allergic reactions like skin rash, itching or hives, swelling of the face, lips, or tongue -low blood counts - this medicine may decrease the number of white blood cells, red blood cells and platelets. You may be at increased risk for infections and bleeding. -signs of infection - fever or chills, cough, sore throat, pain or difficulty passing urine -signs of decreased platelets or bleeding - bruising, pinpoint red spots on the skin, black, tarry stools, blood in the urine -signs of decreased red blood cells - unusually weak or tired, fainting spells, lightheadedness -pain, tingling, numbness in the hands or feet Side effects that usually do not require medical attention (Report these to your doctor or health care professional if they continue or are bothersome.): -constipation -hair loss -headache -loss of appetite -muscle or joint pain -nausea, vomiting -stomach pain This list may not describe all possible side effects. Call your doctor for medical advice about side effects. You may report side effects to FDA at 1-800-FDA-1088. Where should I keep my medicine? This drug is given in a hospital or clinic and will not be stored at home. NOTE: This sheet is a summary. It may not cover all possible information. If you have questions about this medicine, talk to your doctor, pharmacist, or health care provider.  2015, Elsevier/Gold Standard. (2009-07-25 23:04:37)

## 2015-01-22 NOTE — Progress Notes (Signed)
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 She also has suspicious bone lesions but not easily accessible acc.to IR.  Therefore we are giving her the benefit of the doubt and treating her with curative intent. MRI suggests nodal involvement in the axilla.      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 Liver metastases   09/14/2012 Cancer Staging PET scan- multiple hypermetabolic areas left pectoral node the breast mass, left hilar, suprahilar, right hilar, right suprahilar, subcarinal lymph node, liver, right iliac, bilateral inguinal nodes, sternum lytic lesion    09/19/2012 Procedure Liver biopsy- Sarcoidosis   09/26/2012 - 01/10/2013 Chemotherapy AC x 4 followed by weekly Paclitaxel x 12. Marker response supraclavicular and mediastinal nodes decreasing the breast mass and liver lesions resolution of sternal activity   04/10/2013 Surgery Bilateral mastectomies, left decidual IDC grade 2 x 0.7 cm with high-grade DCIS LV I 8/11 lymph nodes positive with extracapsular extension ER 99% PR 40%, HER-2 negative. R: No cancer   04/12/2013 Procedure Genetic testing did not reveal BRCA mutations   05/22/2013 - 06/23/2013 Radiation Therapy Adjuvant radiation therapy with Xeloda   06/23/2013 -  Anti-estrogen oral therapy Tamoxifen 20 mg daily with Zoladex every 3 months   05/28/2014  Relapse/Recurrence Bony metastatic disease: Sternum biopsy metastatic carcinoma, HER-2 negative ratio 0.74   06/15/2014 -  Anti-estrogen oral therapy Ibrance, faslodex, xgeva   01/16/2015 Initial Biopsy Successful ultrasound right hepatic biopsy. Diagnosis Liver, needle/core biopsy, right mass - POSITIVE FOR METASTATIC CARCINOMA. Estrogen Receptor: 100%, POSITIVE, STRONG STAINING INTENSITY (PERFORMED MANUALLY) Progesterone Receptor: 0%, NEGATIVE       CHIEF COMPLAINTS/PURPOSE OF CONSULTATION:  Stage IV Breast Cancer XGEVA, Faslodex and Ibrance Initial Diagnosis and Treatment in early 2014. Received AC X 4 followed by weekly Taxol  HISTORY OF PRESENTING ILLNESS:  Brenda Avery 44 y.o. female is here because of Stage IV Breast cancer with significant progression in her liver and declining performance status.  She is here today with her husband and her sister.She wants treatment "the sooner the better". She hasn't felt well over the past few weeks. She has been tired, unable to hold anything on her stomach, back spasms, nausea, and diarrhea.   She has lost almost 20 pounds since January.  She uses supplements like Boost or Ensure.  She was taken off her Prednisone yesterday and switched to Decadron. They haven't picked it up yet.  She gets dressed everyday but she needs help because she gets tired.  She experiences back pain and abdominal pain but only taking half a hydrocodone to alleviate it. She hasn't taken any in the last week. She says "she isn't a pill person". She is up at night secondary to pain. Her sister notes she also has significant nausea but doesn't take her nausea medications regularly. She is afraid to talk about  her pain because "it makes it more real".  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  Liver metastases   09/14/2012 Cancer Staging PET scan- multiple hypermetabolic areas left pectoral node the breast mass, left hilar, suprahilar, right hilar, right suprahilar, subcarinal lymph node, liver, right iliac, bilateral inguinal nodes, sternum lytic lesion    09/19/2012 Procedure Liver biopsy- Sarcoidosis   09/26/2012 - 01/10/2013 Chemotherapy AC x 4 followed by weekly Paclitaxel x 12. Marker response supraclavicular and mediastinal nodes decreasing the breast mass and liver lesions resolution of sternal activity   04/10/2013 Surgery Bilateral mastectomies, left decidual IDC grade 2 x 0.7 cm with high-grade DCIS LV I 8/11 lymph nodes positive with extracapsular extension ER 99% PR 40%, HER-2 negative. R: No cancer   04/12/2013 Procedure Genetic testing did not reveal BRCA mutations   05/22/2013 - 06/23/2013 Radiation Therapy Adjuvant radiation therapy with Xeloda   06/23/2013 -  Anti-estrogen oral therapy Tamoxifen 20 mg daily with Zoladex every 3 months   05/28/2014 Relapse/Recurrence Bony metastatic disease: Sternum biopsy metastatic carcinoma, HER-2 negative ratio 0.74   06/15/2014 -  Anti-estrogen oral therapy Ibrance, faslodex, xgeva   01/16/2015 Initial Biopsy Successful ultrasound right hepatic biopsy. Diagnosis Liver, needle/core biopsy, right mass - POSITIVE FOR METASTATIC CARCINOMA. Estrogen Receptor: 100%, POSITIVE, STRONG STAINING INTENSITY (PERFORMED MANUALLY) Progesterone Receptor: 0%, NEGATIVE       MEDICAL HISTORY:  Past Medical History  Diagnosis Date  . Hyperlipidemia     diet therapy per patient request  . 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 Hematuria 2000/2001    evaluted in [place] with negative results  . Breast CA     left  . S/P radiation therapy  05/10/2013-06/23/2013    Left chest wall / 50.4 Gray @ 1.8 Gray per fraction x 28 fractions/ Left Supraclavicular fossa / 77 Gray @1 .8 Gray per fraction x 25 fractions/ Left PAB / 45 Gy at 1.8 Gray per fraction x  25 fractions/Left scar / 10 Gray at [employer] per fraction x 5 fractions     . Status post chemotherapy      Radiation Therapy Radiosensitizing Xeloda  . Status post chemotherapy  09/26/2012 - 11/07/2012      Neoadjuvant chemotherapy consisting of 4 cycles of a.c. From 09/26/2012 through 11/07/2012 she then received 12 weeks of paclitaxel.  . Use of tamoxifen (Nolvadex) started 06/26/13  . Type II diabetes mellitus     diet therapy     SURGICAL HISTORY: Past Surgical History  Procedure Laterality Date  . Rectocele repair  2004  . Cystoscopy  2001  . Nevus excision  2004    facial  . Portacath placement  09/20/2012    Procedure: INSERTION PORT-A-CATH;  Surgeon: Adin Hector, MD;  Location: [facility];  Service: General;  Laterality: N/A;  port a cath insertion with flouro and ultrasound   . Mastectomy Right 04/10/2013  . Mastectomy modified radical Left 04/10/2013  . Axillary sentinel node biopsy Right 04/10/2013  . Liver biopsy    . Breast biopsy Left 2014  . Tubal ligation  2004  . Mastectomy modified radical Left 04/10/2013    Procedure: LEFT MODIFIED RADICAL MASTECTOMY ;  Surgeon: Adin Hector, MD;  Location: [facility];  Service: General;  Laterality: Left;  .  Simple mastectomy with axillary sentinel node biopsy Right 04/10/2013    Procedure: RIGHT TOTAL  MASTECTOMY WITH AXILLARY SENTINEL NODE BIOPSY;  Surgeon: Adin Hector, MD;  Location: [facility];  Service: General;  Laterality: Right;  Methylene Blue Injection    SOCIAL HISTORY: History   Social History  . Marital Status: Married    Spouse Name: 2  . Number of Children: N/A  . Years of Education: N/A   Occupational History  . Nursing Assistant at [employer]  . Smoking status: Never Smoker   . Smokeless tobacco: Never Used  . Alcohol Use: No  . Drug Use: No  . Sexual Activity: Not Currently    Birth Control/ Protection: Surgical     Comment: menarche age 1, P45   Other Topics Concern  .  Not on file   Social History Narrative    FAMILY HISTORY: Family History  Problem Relation Age of Onset  . Hypertension Mother   . Diabetes Mother     MI  . Heart disease Mother   . Hypertension Sister     x4 only one HTN and one thats DI  . Hypertension Brother     x4 only 1 htn & dm  . Heart disease Father   . Breast cancer Maternal Aunt     diagnosed in her 22s  . Cancer Maternal Aunt     unknown  . Breast cancer Paternal Aunt     diagnosed in her 24s  . Cervical cancer Sister 62  . Breast cancer Maternal Aunt     diagnosed in her 75s  . Breast cancer Cousin     3 maternal cousins - 2 in their 78s, 1 in her 58s   indicated that her mother is deceased. She indicated that her father is deceased. She indicated that both of her sisters are alive. She indicated that only one of her two maternal aunts is alive. She indicated that her paternal aunt is deceased. She indicated that her cousin is deceased.   ALLERGIES:  is allergic to metronidazole; morphine and related; and sulfonamide derivatives.  MEDICATIONS:  Current Outpatient Prescriptions  Medication Sig Dispense Refill  . calcium-vitamin D (OSCAL WITH D) 500-200 MG-UNIT per tablet Take 2 tablets by mouth 2 (two) times daily. 90 tablet 0  . dexamethasone (DECADRON) 4 MG tablet Take 1 tablet (4 mg total) by mouth 4 (four) times daily. 120 tablet 0  . HYDROcodone-acetaminophen (NORCO/VICODIN) 5-325 MG per tablet Take 1 tablet by mouth every 6 (six) hours as needed for moderate pain.    Marland Kitchen lidocaine-prilocaine (EMLA) cream Apply 1 application topically as needed. 30 g 0  . ondansetron (ZOFRAN-ODT) 8 MG disintegrating tablet Take 1 tablet (8 mg total) by mouth every 8 (eight) hours as needed for nausea or vomiting. 30 tablet 0  . pantoprazole (PROTONIX) 40 MG tablet Take 1 tablet (40 mg total) by mouth daily. 30 tablet 0  . polyethylene glycol (MIRALAX / GLYCOLAX) packet Take 17 g by mouth daily as needed for mild constipation.  14 each 0  . potassium chloride SA (K-DUR,KLOR-CON) 20 MEQ tablet Take 1 tablet (20 mEq total) by mouth 2 (two) times daily. 60 tablet 1  . promethazine (PHENERGAN) 25 MG suppository Place 1 suppository (25 mg total) rectally 2 (two) times daily as needed for nausea (if unable to swallow oral anti-emetics). 30 suppository 1  . promethazine (PHENERGAN) 25 MG tablet Take 1 tablet (25 mg total)  by mouth every 6 (six) hours as needed for nausea or vomiting. 60 tablet 2  . fentaNYL (DURAGESIC) 12 MCG/HR Place 1 patch (12.5 mcg total) onto the skin every 3 (three) days. 5 patch 0   No current facility-administered medications for this visit.    Review of Systems  Constitutional: Positive for malaise/fatigue. Negative for weight loss.       Pain  HENT: Negative for congestion, ear discharge, ear pain, hearing loss, nosebleeds, sore throat and tinnitus.   Eyes: Negative.   Respiratory: Positive for shortness of breath. Negative for stridor.   Cardiovascular: Negative.   Gastrointestinal: Positive for nausea and diarrhea.       Decreased appetite  Genitourinary: Negative.   Musculoskeletal: Positive for back pain.  Skin: Negative.   Neurological: Positive for weakness. Negative for dizziness, tingling, tremors, sensory change, speech change, focal weakness, seizures, loss of consciousness and headaches.  Endo/Heme/Allergies: Negative.   Psychiatric/Behavioral: Positive for depression. Negative for suicidal ideas, hallucinations, memory loss and substance abuse. The patient has insomnia. The patient is not nervous/anxious.    14 point ROS was done and is otherwise as detailed above or in HPI   PHYSICAL EXAMINATION:  ECOG PERFORMANCE STATUS: 3 - Symptomatic, >50% confined to bed  Filed Vitals:   01/22/15 0845  BP: 136/86  Pulse: 99  Temp: 97.7 F (36.5 C)  Resp: 16   Filed Weights   01/22/15 0845  Weight: 177 lb (80.287 kg)    Physical Exam  Constitutional: She is oriented to  person, place, and time and well-developed, well-nourished, and in no distress.  She experienced observable pain after laying down for the physical exam.  HENT:  Head: Normocephalic and atraumatic.  Nose: Nose normal.  Mouth/Throat: Oropharynx is clear and moist. No oropharyngeal exudate.  Eyes: Conjunctivae and EOM are normal. Pupils are equal, round, and reactive to light. Right eye exhibits no discharge. Left eye exhibits no discharge. No scleral icterus.  Neck: Normal range of motion. Neck supple. No tracheal deviation present. No thyromegaly present.  Cardiovascular: Normal rate, regular rhythm and normal heart sounds.  Exam reveals no gallop and no friction rub.   No murmur heard. Pulmonary/Chest: Effort normal and breath sounds normal. She has no wheezes. She has no rales.  Abdominal: Soft. Bowel sounds are normal. She exhibits no distension and no mass. There is tenderness. There is no rebound and no guarding.  Palpable liver edge about 6 inches below the costal margin. Marked discomfort with abdominal palpation particularly over the RUQ  Musculoskeletal: Normal range of motion. She exhibits no edema.  Lymphadenopathy:    She has no cervical adenopathy.  Neurological: She is alert and oriented to person, place, and time. She has normal reflexes. No cranial nerve deficit. Gait normal. Coordination normal.  Skin: Skin is warm and dry. No rash noted.  Psychiatric: Mood, memory, affect and judgment normal.  Nursing note and vitals reviewed.    LABORATORY DATA:  I have reviewed the data as listed Lab Results  Component Value Date   WBC 8.7 01/21/2015   HGB 12.9 01/21/2015   HCT 39.1 01/21/2015   MCV 82.1 01/21/2015   PLT 282 01/21/2015    RADIOGRAPHIC STUDIES: I have personally reviewed the radiological images as listed and agreed with the findings in the report.  12/03/2014 CLINICAL DATA: Left breast cancer diagnosed 2014, status post bilateral mastectomy, XRT complete  01/2013, oral chemotherapy in progress  EXAM: CT CHEST, ABDOMEN, AND PELVIS WITH CONTRAST  IMPRESSION: Status  post mastectomy with bilateral axillary lymph node dissection.  Small mediastinal lymph nodes, non FDG avid on recent PET, indeterminate.  Multifocal hepatic lesions, highly suspicious for hepatic metastases, as above.  Innumerable hypoenhancing lesions in the cyst spleen, possibly reflecting infiltrating disease such as sarcoidosis or amyloid. Metastatic disease is considered less likely.  Progression of upper abdominal/retroperitoneal lymphadenopathy, suspicious for nodal metastases.  Mixed lytic/sclerotic metastasis in the right sternum. Additional known osseous metastases in the bilateral pelvis are not apparent on CT.   Electronically Signed  By: Julian Hy M.D.  On: 12/03/2014 09:47   ASSESSMENT & PLAN:  Stage IV ER+ Carcinoma of Breast Liver Metastases with liver dysfunction Poor Insight into prognosis Poor PS   Spent significant time today discussing end-of-life issues with the patient and her family. The patient has a lot of denial as she was not willing to openly discussed these things. She is very insistent that she wanted treatment to simply get back to her old self. We will obviously have to aggressively address this moving forward. I am very concerned about her ability to tolerate therapy and also about potential response to therapy given the rapid progression of her disease and her rapid decline.  I discussed therapy with single agent [place] today. The goal will be to start her on Friday. No dose adjustments are needed in spite of her liver dysfunction.  I addressed quality-of-life issues which include adequate pain control and controlling symptoms from her disease such as nausea. She is extremely reluctant to try pain medication, after long discussion she agrees to start a low-dose fentanyl patch. We will escalate the dose  accordingly.  Once a palliative care clinic is in place she will probably be referred.  A total of 40 minutes was spent with the patient and family, discussing her disease, treatment planning and record review.  All questions were answered. The patient knows to call the clinic with any problems, questions or concerns. This note was electronically signed.    This document serves as a record of services personally performed by Ancil Linsey, MD. It was created on her behalf by Arlyce Harman, a trained medical scribe. The creation of this record is based on the scribe's personal observations and the provider's statements to them. This document has been checked and approved by the attending provider.  I have reviewed the above documentation for accuracy and completeness, and I agree with the above.  Molli Hazard, MD  01/23/2015 8:24 AM

## 2015-01-22 NOTE — Telephone Encounter (Signed)
PC TO [employer] [phone] TO SEE IF [employer] [id number] REQUIRES  AUTH. PER ISHA L IT DOES NOT CALL REF# [id number]

## 2015-01-23 ENCOUNTER — Encounter (HOSPITAL_COMMUNITY): Payer: Self-pay | Admitting: Hematology & Oncology

## 2015-01-23 ENCOUNTER — Encounter: Payer: Self-pay | Admitting: *Deleted

## 2015-01-23 NOTE — Progress Notes (Signed)
[facility] Clinical Social Work  Clinical Social Work was referred by Futures trader for assessment of psychosocial needs due to pt with advanced cancer transferring care to [facility].  Clinical Social Worker met with patient, her sister and husband at [facility] to offer support and assess for needs.  CSW introduced self and explained role of CSW. CSW also discussed Support Programs available to assist pt and family. CSW provided hand outs with CSW contact information and Support Programs. Pt and family deny current needs, but were appreciative of support.  Clinical Social Work interventions: Supportive listening Resource education  Loren Racer, [place] Tuesdays 8:30-1pm Wednesdays 8:30-12pm  [phone]

## 2015-01-24 ENCOUNTER — Encounter (HOSPITAL_COMMUNITY): Payer: BLUE CROSS/BLUE SHIELD

## 2015-01-24 DIAGNOSIS — C50912 Malignant neoplasm of unspecified site of left female breast: Secondary | ICD-10-CM

## 2015-01-24 NOTE — Patient Instructions (Addendum)
[facility]   CHEMOTHERAPY INSTRUCTIONS  HALAVEN can cause serious side effects, including  Low white blood cell count (neutropenia). This can lead to serious infections that could lead to death. Your health care provider will check your blood cell counts. Call your health care provider right away if you develop fever (temperature above 100.67F), chills, cough, or burning or pain when you urinate, as any of these can be symptoms of infection   Numbness, tingling, or pain in your hands or feet (peripheral neuropathy). Peripheral neuropathy is common with HALAVEN and sometimes can be severe. Tell your health care provider if you have new or worsening symptoms of peripheral neuropathy  HALAVEN can cause changes in your heartbeat (called QT prolongation). This can cause irregular heartbeats. Your health care provider may do heart monitoring (electrocardiogram or ECG) or blood tests during your treatment with HALAVEN to check for heart problems.   The most common side effects of HALAVEN in adults with breast cancer include low white blood cell count (neutropenia), low red blood cell count (anemia), weakness or tiredness, hair loss (alopecia), nausea, and constipation.   You will receive this medication on Days 1 & 8 every 21 days. This medication takes 10 minutes to infuse. Prior to receiving this medication you will receive 2 premeds (Zofran (for nausea prevention) and Dexamethasone (steroid - for nausea prevention). Your premeds will take 30 minutes to infuse. Side Effects of Dexamethasone: feeling energized, nervous, anxious, trouble sleeping, feeling hot/flushed or having a pink/red appearance of face/neck. These side effects will pass as the Dexamethasone wears off.      POTENTIAL SIDE EFFECTS OF TREATMENT: Increased Susceptibility to Infection, Vomiting, Constipation, Hair Thinning, Changes in Character of Skin and Nails (brittleness, dryness,etc.), Bone Marrow  Suppression, Abdominal Cramping, Complete Hair Loss and Nausea   SELF IMAGE NEEDS AND REFERRALS MADE: Obtain hair accessories as soon as possible (wigs, scarves, turbans,caps,etc.)   EDUCATIONAL MATERIALS GIVEN AND REVIEWED: Specific Instructions Sheets: Halaven, Dexamethasone, Zofran   SELF CARE ACTIVITIES WHILE ON CHEMOTHERAPY: Increase your fluid intake 48 hours prior to treatment and drink at least 2 quarts per day after treatment., No alcohol intake., No aspirin or other medications unless approved by your oncologist., Eat foods that are light and easy to digest., Eat foods at cold or room temperature., No fried, fatty, or spicy foods immediately before or after treatment., Have teeth cleaned professionally before starting treatment. Keep dentures and partial plates clean., Use soft toothbrush and do not use mouthwashes that contain alcohol. Biotene is a good mouthwash that is available at most pharmacies or may be ordered by calling [phone]., Use warm salt water gargles (1 teaspoon salt per 1 quart warm water) before and after meals and at bedtime. Or you may rinse with 2 tablespoons of three -percent hydrogen peroxide mixed in eight ounces of water., Always use sunscreen with SPF (Sun Protection Factor) of 30 or higher., Use your nausea medication as directed to prevent nausea., Use your stool softener or laxative as directed to prevent constipation. and Use your anti-diarrheal medication as directed to stop diarrhea.  Please wash your hands for at least 30 seconds using warm soapy water. Handwashing is the #1 way to prevent the spread of germs. Stay away from sick people or people who are getting over a cold. If you develop respiratory systems such as green/yellow mucus production or productive cough or persistent cough let us know and we will see if you need an antibiotic. It  is a good idea to keep a pair of gloves on when going into grocery stores/[employer] to decrease your risk of  coming into contact with germs on the carts, etc. Carry alcohol hand gel with you at all times and use it frequently if out in public. All foods need to be cooked thoroughly. No raw foods. No medium or undercooked meats, eggs. If your food is cooked medium well, it does not need to be hot pink or saturated with bloody liquid at all. Vegetables and fruits need to be washed/rinsed under the faucet with a dish detergent before being consumed. You can eat raw fruits and vegetables unless we tell you otherwise but it would be best if you cooked them or bought frozen. Do not eat off of salad bars or hot bars unless you really trust the cleanliness of the restaurant. If you need dental work, please let Dr. Whitney Muse know before you go for your appointment so that we can coordinate the best possible time for you in regards to your chemo regimen. You need to also let your dentist know that you are actively taking chemo. We may need to do labs prior to your dental appointment. We also want your bowels moving at least every other day. If this is not happening, we need to know so that we can get you on a bowel regimen to help you go.     MEDICATIONS: You have been given prescriptions for the following medications:  Zofran 8mg  tablet. Take 1 tablet every 8 hours as needed for nausea/vomiting. (#1 nausea med to take, this can constipate)  Phenergan 25mg  tablet. Take 1 tablet every 6 hours as needed for nausea/vomiting. (may make you sleepy)  EMLA cream. Apply a quarter size amount to port site 1 hour prior to chemo. Do not rub in. Cover with plastic wrap.   Over-the-Counter Meds:  Miralax 1 capful in 8 oz of fluid daily. May increase to two times a day if needed. This is a stool softener. If this doesn't work proceed you can add:  Senokot S  - start with 1 tablet two times a day and increase to 4 tablets two times a day if needed. (total of 8 tablets in a 24 hour period). This is a stimulant laxative.   Call us  if this does not help your bowels move.   Imodium 2mg  capsule. Take 2 capsules after the 1st loose stool and then 1 capsule every 2 hours until you go a total of 12 hours without having a loose stool. Call the [facility] if loose stools continue.  SYMPTOMS TO REPORT AS SOON AS POSSIBLE AFTER TREATMENT:  FEVER GREATER THAN 100.5 F  CHILLS WITH OR WITHOUT FEVER  NAUSEA AND VOMITING THAT IS NOT CONTROLLED WITH YOUR NAUSEA MEDICATION  UNUSUAL SHORTNESS OF BREATH  UNUSUAL BRUISING OR BLEEDING  TENDERNESS IN MOUTH AND THROAT WITH OR WITHOUT PRESENCE OF ULCERS  URINARY PROBLEMS  BOWEL PROBLEMS  UNUSUAL RASH    Wear comfortable clothing and clothing appropriate for easy access to any Portacath or PICC line. Let us know if there is anything that we can do to make your therapy better!      I have been informed and understand all of the instructions given to me and have received a copy. I have been instructed to call the clinic [phone] or my family physician as soon as possible for continued medical care, if indicated. I do not have any more questions at this time  but understand that I may call the [facility] or the Patient Navigator at [phone] during office hours should I have questions or need assistance in obtaining follow-up care.          Eribulin solution for injection What is this medicine? ERIBULIN is a chemotherapy drug. It is used to treat breast cancer. This medicine may be used for other purposes; ask your health care provider or pharmacist if you have questions. COMMON BRAND NAME(S): Halaven What should I tell my health care provider before I take this medicine? They need to know if you have any of these conditions: -heart disease -kidney disease -liver disease -low blood counts, like low white cell, platelet, or red cell counts -an unusual or allergic reaction to eribulin, other medicines, foods, dyes, or preservatives -pregnant or trying to  get pregnant -breast-feeding How should I use this medicine? This medicine is for infusion into a vein. It is given by a health care professional in a hospital or clinic setting. Talk to your pediatrician regarding the use of this medicine in children. Special care may be needed. Overdosage: If you think you've taken too much of this medicine contact a poison control center or emergency room at once. Overdosage: If you think you have taken too much of this medicine contact a poison control center or emergency room at once. NOTE: This medicine is only for you. Do not share this medicine with others. What if I miss a dose? It is important not to miss your dose. Call your doctor or health care professional if you are unable to keep an appointment. What may interact with this medicine? Do not take this medicine with any of the following medications: -amiodarone -astemizole -arsenic trioxide -bepridil -bretylium -chloroquine -chlorpromazine -cisapride -clarithromycin -dextromethorphan, quinidine -disopyramide -dofetilide -droperidol -dronedarone -erythromycin -grepafloxacin -halofantrine -haloperidol -ibutilide -levomethadyl -mesoridazine -methadone -pentamidine -procainamide -quinidine -pimozide -posaconazole -probucol -propafenone -saquinavir -sotalol -sparfloxacin -terfenadine -thioridazine -troleandomycin -ziprasidone This list may not describe all possible interactions. Give your health care provider a list of all the medicines, herbs, non-prescription drugs, or dietary supplements you use. Also tell them if you smoke, drink alcohol, or use illegal drugs. Some items may interact with your medicine. What should I watch for while using this medicine? Your condition will be monitored carefully while you are receiving this medicine. This drug may make you feel generally unwell. This is not uncommon, as chemotherapy can affect healthy cells as well as cancer cells.  Report any side effects. Continue your course of treatment even though you feel ill unless your doctor tells you to stop. Call your doctor or health care professional for advice if you get a fever, chills or sore throat, or other symptoms of a cold or flu. Do not treat yourself. This drug decreases your body's ability to fight infections. Try to avoid being around people who are sick. This medicine may increase your risk to bruise or bleed. Call your doctor or health care professional if you notice any unusual bleeding. Be careful brushing and flossing your teeth or using a toothpick because you may get an infection or bleed more easily. If you have any dental work done, tell your dentist you are receiving this medicine. Avoid taking products that contain aspirin, acetaminophen, ibuprofen, naproxen, or ketoprofen unless instructed by your doctor. These medicines may hide a fever. Do not become pregnant while taking this medicine. Women should inform their doctor if they wish to become pregnant or think they might be pregnant. There is a  potential for serious side effects to an unborn child. Talk to your health care professional or pharmacist for more information. Do not breast-feed an infant while taking this medicine. What side effects may I notice from receiving this medicine? Side effects that you should report to your doctor or health care professional as soon as possible: -allergic reactions like skin rash, itching or hives, swelling of the face, lips, or tongue -low blood counts - this medicine may decrease the number of white blood cells, red blood cells and platelets. You may be at increased risk for infections and bleeding. -signs of infection - fever or chills, cough, sore throat, pain or difficulty passing urine -signs of decreased platelets or bleeding - bruising, pinpoint red spots on the skin, black, tarry stools, blood in the urine -signs of decreased red blood cells - unusually weak or  tired, fainting spells, lightheadedness -pain, tingling, numbness in the hands or feet Side effects that usually do not require medical attention (Report these to your doctor or health care professional if they continue or are bothersome.): -constipation -hair loss -headache -loss of appetite -muscle or joint pain -nausea, vomiting -stomach pain This list may not describe all possible side effects. Call your doctor for medical advice about side effects. You may report side effects to FDA at 1-800-FDA-1088. Where should I keep my medicine? This drug is given in a hospital or clinic and will not be stored at home. NOTE: This sheet is a summary. It may not cover all possible information. If you have questions about this medicine, talk to your doctor, pharmacist, or health care provider.  2015, Elsevier/Gold Standard. (2009-07-25 23:04:37) Ondansetron injection What is this medicine? ONDANSETRON (on DAN se tron) is used to treat nausea and vomiting caused by chemotherapy. It is also used to prevent or treat nausea and vomiting after surgery. This medicine may be used for other purposes; ask your health care provider or pharmacist if you have questions. COMMON BRAND NAME(S): Zofran What should I tell my health care provider before I take this medicine? They need to know if you have any of these conditions: -heart disease -history of irregular heartbeat -liver disease -low levels of magnesium or potassium in the blood -an unusual or allergic reaction to ondansetron, granisetron, other medicines, foods, dyes, or preservatives -pregnant or trying to get pregnant -breast-feeding How should I use this medicine? This medicine is for infusion into a vein. It is given by a health care professional in a hospital or clinic setting. Talk to your pediatrician regarding the use of this medicine in children. Special care may be needed. Overdosage: If you think you have taken too much of this medicine  contact a poison control center or emergency room at once. NOTE: This medicine is only for you. Do not share this medicine with others. What if I miss a dose? This does not apply. What may interact with this medicine? Do not take this medicine with any of the following medications: -apomorphine -certain medicines for fungal infections like fluconazole, itraconazole, ketoconazole, posaconazole, voriconazole -cisapride -dofetilide -dronedarone -pimozide -thioridazine -ziprasidone This medicine may also interact with the following medications: -carbamazepine -certain medicines for depression, anxiety, or psychotic disturbances -fentanyl -linezolid -MAOIs like Carbex, Eldepryl, Marplan, Nardil, and Parnate -methylene blue (injected into a vein) -other medicines that prolong the QT interval (cause an abnormal heart rhythm) -phenytoin -rifampicin -tramadol This list may not describe all possible interactions. Give your health care provider a list of all the medicines, herbs, non-prescription drugs, or  dietary supplements you use. Also tell them if you smoke, drink alcohol, or use illegal drugs. Some items may interact with your medicine. What should I watch for while using this medicine? Your condition will be monitored carefully while you are receiving this medicine. What side effects may I notice from receiving this medicine? Side effects that you should report to your doctor or health care professional as soon as possible: -allergic reactions like skin rash, itching or hives, swelling of the face, lips, or tongue -breathing problems -confusion -dizziness -fast or irregular heartbeat -feeling faint or lightheaded, falls -fever and chills -loss of balance or coordination -seizures -sweating -swelling of the hands and feet -tightness in the chest -tremors -unusually weak or tired Side effects that usually do not require medical attention (report to your doctor or health care  professional if they continue or are bothersome): -constipation or diarrhea -headache This list may not describe all possible side effects. Call your doctor for medical advice about side effects. You may report side effects to FDA at 1-800-FDA-1088. Where should I keep my medicine? This drug is given in a hospital or clinic and will not be stored at home. NOTE: This sheet is a summary. It may not cover all possible information. If you have questions about this medicine, talk to your doctor, pharmacist, or health care provider.  2015, Elsevier/Gold Standard. (2013-05-10 16:18:28) Dexamethasone injection What is this medicine? DEXAMETHASONE (dex a METH a sone) is a corticosteroid. It is used to treat inflammation of the skin, joints, lungs, and other organs. Common conditions treated include asthma, allergies, and arthritis. It is also used for other conditions, like blood disorders and diseases of the adrenal glands. This medicine may be used for other purposes; ask your health care provider or pharmacist if you have questions. COMMON BRAND NAME(S): Decadron, Solurex What should I tell my health care provider before I take this medicine? They need to know if you have any of these conditions: -blood clotting problems -Cushing's syndrome -diabetes -glaucoma -heart problems or disease -high blood pressure -infection like herpes, measles, tuberculosis, or chickenpox -kidney disease -liver disease -mental problems -myasthenia gravis -osteoporosis -previous heart attack -seizures -stomach, ulcer or intestine disease including colitis and diverticulitis -thyroid problem -an unusual or allergic reaction to dexamethasone, corticosteroids, other medicines, lactose, foods, dyes, or preservatives -pregnant or trying to get pregnant -breast-feeding How should I use this medicine? This medicine is for injection into a muscle, joint, lesion, soft tissue, or vein. It is given by a health care  professional in a hospital or clinic setting. Talk to your pediatrician regarding the use of this medicine in children. Special care may be needed. Overdosage: If you think you have taken too much of this medicine contact a poison control center or emergency room at once. NOTE: This medicine is only for you. Do not share this medicine with others. What if I miss a dose? This may not apply. If you are having a series of injections over a prolonged period, try not to miss an appointment. Call your doctor or health care professional to reschedule if you are unable to keep an appointment. What may interact with this medicine? Do not take this medicine with any of the following medications: -mifepristone, RU-486 -vaccines This medicine may also interact with the following medications: -amphotericin B -antibiotics like clarithromycin, erythromycin, and troleandomycin -aspirin and aspirin-like drugs -barbiturates like phenobarbital -carbamazepine -cholestyramine -cholinesterase inhibitors like donepezil, galantamine, rivastigmine, and tacrine -cyclosporine -digoxin -diuretics -ephedrine -female hormones, like estrogens or  progestins and birth control pills -indinavir -isoniazid -ketoconazole -medicines for diabetes -medicines that improve muscle tone or strength for conditions like myasthenia gravis -NSAIDs, medicines for pain and inflammation, like ibuprofen or naproxen -phenytoin -rifampin -thalidomide -warfarin This list may not describe all possible interactions. Give your health care provider a list of all the medicines, herbs, non-prescription drugs, or dietary supplements you use. Also tell them if you smoke, drink alcohol, or use illegal drugs. Some items may interact with your medicine. What should I watch for while using this medicine? Your condition will be monitored carefully while you are receiving this medicine. If you are taking this medicine for a long time, carry an  identification card with your name and address, the type and dose of your medicine, and your doctor's name and address. This medicine may increase your risk of getting an infection. Stay away from people who are sick. Tell your doctor or health care professional if you are around anyone with measles or chickenpox. Talk to your health care provider before you get any vaccines that you take this medicine. If you are going to have surgery, tell your doctor or health care professional that you have taken this medicine within the last twelve months. Ask your doctor or health care professional about your diet. You may need to lower the amount of salt you eat. The medicine can increase your blood sugar. If you are a diabetic check with your doctor if you need help adjusting the dose of your diabetic medicine. What side effects may I notice from receiving this medicine? Side effects that you should report to your doctor or health care professional as soon as possible: -allergic reactions like skin rash, itching or hives, swelling of the face, lips, or tongue -black or tarry stools -change in the amount of urine -changes in vision -confusion, excitement, restlessness, a false sense of well-being -fever, sore throat, sneezing, cough, or other signs of infection, wounds that will not heal -hallucinations -increased thirst -mental depression, mood swings, mistaken feelings of self importance or of being mistreated -pain in hips, back, ribs, arms, shoulders, or legs -pain, redness, or irritation at the injection site -redness, blistering, peeling or loosening of the skin, including inside the mouth -rounding out of face -swelling of feet or lower legs -unusual bleeding or bruising -unusual tired or weak -wounds that do not heal Side effects that usually do not require medical attention (report to your doctor or health care professional if they continue or are bothersome): -diarrhea or  constipation -change in taste -headache -nausea, vomiting -skin problems, acne, thin and shiny skin -touble sleeping -unusual growth of hair on the face or body -weight gain This list may not describe all possible side effects. Call your doctor for medical advice about side effects. You may report side effects to FDA at 1-800-FDA-1088. Where should I keep my medicine? This drug is given in a hospital or clinic and will not be stored at home. NOTE: This sheet is a summary. It may not cover all possible information. If you have questions about this medicine, talk to your doctor, pharmacist, or health care provider.  2015, Elsevier/Gold Standard. (2007-11-24 14:04:12)

## 2015-01-24 NOTE — Progress Notes (Signed)
Chemo teaching done [facility]) over the phone with patient. Patient needs refill on Oxycodone. Calendar to be given to patient tomorrow. Consent to be obtained tomorrow. I asked pt to bring meds with her since there is a discrepancy between what meds she says she takes for pain and which ones are actually ordered. Representative said ok (regarding bringing all meds in).

## 2015-01-25 ENCOUNTER — Encounter (HOSPITAL_COMMUNITY): Payer: Self-pay

## 2015-01-25 ENCOUNTER — Ambulatory Visit: Payer: BLUE CROSS/BLUE SHIELD

## 2015-01-25 ENCOUNTER — Encounter (HOSPITAL_BASED_OUTPATIENT_CLINIC_OR_DEPARTMENT_OTHER): Payer: BLUE CROSS/BLUE SHIELD

## 2015-01-25 VITALS — BP 110/68 | HR 97 | Temp 98.1°F | Resp 20 | Wt 175.8 lb

## 2015-01-25 DIAGNOSIS — C7951 Secondary malignant neoplasm of bone: Secondary | ICD-10-CM | POA: Diagnosis not present

## 2015-01-25 DIAGNOSIS — C50912 Malignant neoplasm of unspecified site of left female breast: Secondary | ICD-10-CM

## 2015-01-25 DIAGNOSIS — Z5111 Encounter for antineoplastic chemotherapy: Secondary | ICD-10-CM

## 2015-01-25 DIAGNOSIS — C787 Secondary malignant neoplasm of liver and intrahepatic bile duct: Secondary | ICD-10-CM | POA: Diagnosis not present

## 2015-01-25 DIAGNOSIS — C50919 Malignant neoplasm of unspecified site of unspecified female breast: Secondary | ICD-10-CM

## 2015-01-25 DIAGNOSIS — C799 Secondary malignant neoplasm of unspecified site: Secondary | ICD-10-CM | POA: Diagnosis not present

## 2015-01-25 LAB — CBC WITH DIFFERENTIAL/PLATELET
Basophils Absolute: 0 K/uL (ref 0.0–0.1)
Basophils Relative: 0 % (ref 0–1)
Eosinophils Absolute: 0 K/uL (ref 0.0–0.7)
Eosinophils Relative: 0 % (ref 0–5)
HCT: 36 % (ref 36.0–46.0)
Hemoglobin: 12.3 g/dL (ref 12.0–15.0)
Lymphocytes Relative: 11 % — ABNORMAL LOW (ref 12–46)
Lymphs Abs: 1.1 K/uL (ref 0.7–4.0)
MCH: 27.3 pg (ref 26.0–34.0)
MCHC: 34.2 g/dL (ref 30.0–36.0)
MCV: 79.8 fL (ref 78.0–100.0)
Monocytes Absolute: 0.5 K/uL (ref 0.1–1.0)
Monocytes Relative: 5 % (ref 3–12)
Neutro Abs: 8.8 K/uL — ABNORMAL HIGH (ref 1.7–7.7)
Neutrophils Relative %: 85 % — ABNORMAL HIGH (ref 43–77)
Platelets: 258 K/uL (ref 150–400)
RBC: 4.51 MIL/uL (ref 3.87–5.11)
RDW: 15.8 % — ABNORMAL HIGH (ref 11.5–15.5)
WBC: 10.3 K/uL (ref 4.0–10.5)

## 2015-01-25 LAB — COMPREHENSIVE METABOLIC PANEL WITH GFR
ALT: 143 U/L — ABNORMAL HIGH (ref 14–54)
AST: 231 U/L — ABNORMAL HIGH (ref 15–41)
Albumin: 2.4 g/dL — ABNORMAL LOW (ref 3.5–5.0)
Alkaline Phosphatase: 424 U/L — ABNORMAL HIGH (ref 38–126)
Anion gap: 15 (ref 5–15)
BUN: 22 mg/dL — ABNORMAL HIGH (ref 6–20)
CO2: 25 mmol/L (ref 22–32)
Calcium: 9.1 mg/dL (ref 8.9–10.3)
Chloride: 93 mmol/L — ABNORMAL LOW (ref 101–111)
Creatinine, Ser: 0.92 mg/dL (ref 0.44–1.00)
GFR calc Af Amer: >60 mL/min
GFR calc non Af Amer: >60 mL/min
Glucose, Bld: 237 mg/dL — ABNORMAL HIGH (ref 65–99)
Potassium: 4.8 mmol/L (ref 3.5–5.1)
Sodium: 133 mmol/L — ABNORMAL LOW (ref 135–145)
Total Bilirubin: 2.6 mg/dL — ABNORMAL HIGH (ref 0.3–1.2)
Total Protein: 6.7 g/dL (ref 6.5–8.1)

## 2015-01-25 MED ORDER — SODIUM CHLORIDE 0.9 % IV SOLN
Freq: Once | INTRAVENOUS | Status: AC
  Administered 2015-01-25: 12:00:00 via INTRAVENOUS

## 2015-01-25 MED ORDER — SODIUM CHLORIDE 0.9 % IJ SOLN: 10.0000 mL | INTRAMUSCULAR | Status: DC | PRN

## 2015-01-25 MED ORDER — HEPARIN SOD (PORK) LOCK FLUSH 100 UNIT/ML IV SOLN
INTRAVENOUS | Status: AC
  Filled 2015-01-25: qty 5

## 2015-01-25 MED ORDER — OXYCODONE HCL 5 MG PO TABS
5.0000 mg | ORAL_TABLET | Freq: Once | ORAL | Status: AC
Start: 2015-01-25 — End: 2015-01-25
  Administered 2015-01-25: 5 mg via ORAL

## 2015-01-25 MED ORDER — OXYCODONE HCL 5 MG PO TABS: 5.0000 mg | ORAL_TABLET | Freq: Once | ORAL | Status: DC

## 2015-01-25 MED ORDER — OXYCODONE HCL 5 MG PO TABS
ORAL_TABLET | ORAL | Status: AC
  Filled 2015-01-25: qty 1

## 2015-01-25 MED ORDER — SODIUM CHLORIDE 0.9 % IV SOLN
Freq: Once | INTRAVENOUS | Status: AC
  Administered 2015-01-25: 12:00:00 via INTRAVENOUS
  Filled 2015-01-25: qty 4

## 2015-01-25 MED ORDER — OXYCODONE HCL 5 MG PO TABS: ORAL_TABLET | ORAL | Status: DC

## 2015-01-25 MED ORDER — POTASSIUM CHLORIDE CRYS ER 20 MEQ PO TBCR: 20.0000 meq | EXTENDED_RELEASE_TABLET | Freq: Two times a day (BID) | ORAL | Status: DC

## 2015-01-25 MED ORDER — SODIUM CHLORIDE 0.9 % IV SOLN
1.4000 mg/m2 | Freq: Once | INTRAVENOUS | Status: AC
  Administered 2015-01-25: 2.65 mg via INTRAVENOUS
  Filled 2015-01-25: qty 5.3

## 2015-01-25 MED ORDER — HEPARIN SOD (PORK) LOCK FLUSH 100 UNIT/ML IV SOLN
500.0000 [IU] | Freq: Once | INTRAVENOUS | Status: AC | PRN
  Administered 2015-01-25: 500 [IU]

## 2015-01-25 NOTE — Progress Notes (Signed)
Brenda Avery Tolerated chemotherapy well today Discharged ambulatory

## 2015-01-25 NOTE — Patient Instructions (Signed)
[facility] Discharge Instructions for Patients Receiving Chemotherapy  Today you received the following chemotherapy agents haloven Follow up as scheduled Please call the clinic if you have any questions or concerns  To help prevent nausea and vomiting after your treatment, we encourage you to take your nausea medication  If you develop nausea and vomiting, or diarrhea that is not controlled by your medication, call the clinic.  The clinic phone number is [phone]. Office hours are Monday-Friday 8:30am-5:00pm.  BELOW ARE SYMPTOMS THAT SHOULD BE REPORTED IMMEDIATELY:  *FEVER GREATER THAN 101.0 F  *CHILLS WITH OR WITHOUT FEVER  NAUSEA AND VOMITING THAT IS NOT CONTROLLED WITH YOUR NAUSEA MEDICATION  *UNUSUAL SHORTNESS OF BREATH  *UNUSUAL BRUISING OR BLEEDING  TENDERNESS IN MOUTH AND THROAT WITH OR WITHOUT PRESENCE OF ULCERS  *URINARY PROBLEMS  *BOWEL PROBLEMS  UNUSUAL RASH Items with * indicate a potential emergency and should be followed up as soon as possible. If you have an emergency after office hours please contact your primary care physician or go to the nearest emergency department.  Please call the clinic during office hours if you have any questions or concerns.   You may also contact the Patient Navigator at [phone] should you have any questions or need assistance in obtaining follow up care. _____________________________________________________________________ Have you asked about our STAR program?    STAR stands for Survivorship Training and Rehabilitation, and this is a nationally recognized cancer care program that focuses on survivorship and rehabilitation.  Cancer and cancer treatments may cause problems, such as, pain, making you feel tired and keeping you from doing the things that you need or want to do. Cancer rehabilitation can help. Our goal is to reduce these troubling effects and help you have the best quality of life  possible.  You may receive a survey from a nurse that asks questions about your current state of health.  Based on the survey results, all eligible patients will be referred to the [facility] program for an evaluation so we can better serve you! A frequently asked questions sheet is available upon request.

## 2015-01-25 NOTE — Progress Notes (Signed)
Consent for Halaven obtained. Potassium refilled[facility]. Oxycodone IR 5mg  written for patient. RX given. Chemo card given. Chemo and You booklet given. Calendar given to patient. What to know After Chemo paper given.

## 2015-01-28 ENCOUNTER — Encounter (HOSPITAL_COMMUNITY): Payer: Self-pay | Admitting: *Deleted

## 2015-01-28 ENCOUNTER — Telehealth (HOSPITAL_COMMUNITY): Payer: Self-pay | Admitting: *Deleted

## 2015-01-28 DIAGNOSIS — C50912 Malignant neoplasm of unspecified site of left female breast: Secondary | ICD-10-CM

## 2015-01-28 MED ORDER — FIRST-DUKES MOUTHWASH MT SUSP: OROMUCOSAL | Status: DC

## 2015-01-28 MED ORDER — MAGIC MOUTHWASH: ORAL | Status: DC

## 2015-01-28 NOTE — Telephone Encounter (Signed)
Patient notified that [facility] called in and that [facility] will deliver.

## 2015-01-28 NOTE — Progress Notes (Signed)
Patient's Magic Mouthwash changed to [facility] Magic Mouthwash with Maalox added per pharmacist recommendation @ [facility].

## 2015-01-28 NOTE — Progress Notes (Unsigned)
Patient called complaining of her mouth/throat burning and her mouth is very sore. Magic mouthwash with lidocaine called in to [facility].

## 2015-01-29 ENCOUNTER — Other Ambulatory Visit: Payer: BLUE CROSS/BLUE SHIELD

## 2015-01-29 ENCOUNTER — Ambulatory Visit: Payer: BLUE CROSS/BLUE SHIELD | Admitting: Oncology

## 2015-01-29 ENCOUNTER — Ambulatory Visit: Payer: BLUE CROSS/BLUE SHIELD

## 2015-01-30 ENCOUNTER — Telehealth (HOSPITAL_COMMUNITY): Payer: Self-pay | Admitting: *Deleted

## 2015-01-30 ENCOUNTER — Encounter: Payer: Self-pay | Admitting: *Deleted

## 2015-01-30 NOTE — Telephone Encounter (Signed)
Message left for patient to call me and let me know how she is doing today.

## 2015-01-30 NOTE — Progress Notes (Signed)
[facility] Psychosocial Distress Screening Clinical Social Work  Clinical Social Work was referred by distress screening protocol.  The patient scored a 5 on the Psychosocial Distress Thermometer which indicates moderate distress. Clinical Social Worker phoned pt to assess for distress and other psychosocial needs. CSW met with pt day prior to this distress screen and also phoned pt today to further assess needs. Pt left message for pt today and awaits return call.   ONCBCN DISTRESS SCREENING 01/25/2015  Screening Type Initial Screening  Distress experienced in past week (1-10) 5  Emotional problem type Nervousness/Anxiety    Clinical Social Worker follow up needed: yes  If yes, follow up plan: See above Loren Racer, [place] Tuesdays 8:30-1pm Wednesdays 8:30-12pm  [phone]

## 2015-01-31 ENCOUNTER — Telehealth: Payer: Self-pay | Admitting: *Deleted

## 2015-01-31 NOTE — Telephone Encounter (Signed)
Linward Foster is listed for [place] form.  Called inquiring about Liver Bx report not being on MyChart.  Hospital test are not on MyChart.  Instructed to contact the hospital medical records department with this request as she says patient is not able to go to hospital to sign for records.

## 2015-02-01 ENCOUNTER — Encounter (HOSPITAL_BASED_OUTPATIENT_CLINIC_OR_DEPARTMENT_OTHER): Payer: BLUE CROSS/BLUE SHIELD | Admitting: Hematology & Oncology

## 2015-02-01 ENCOUNTER — Other Ambulatory Visit (HOSPITAL_COMMUNITY): Payer: Self-pay | Admitting: *Deleted

## 2015-02-01 ENCOUNTER — Ambulatory Visit: Payer: BLUE CROSS/BLUE SHIELD

## 2015-02-01 ENCOUNTER — Encounter (HOSPITAL_BASED_OUTPATIENT_CLINIC_OR_DEPARTMENT_OTHER): Payer: BLUE CROSS/BLUE SHIELD

## 2015-02-01 ENCOUNTER — Ambulatory Visit (HOSPITAL_COMMUNITY): Payer: BLUE CROSS/BLUE SHIELD | Admitting: Hematology & Oncology

## 2015-02-01 VITALS — BP 132/85 | HR 92

## 2015-02-01 VITALS — BP 106/81 | HR 120 | Temp 97.9°F | Resp 14 | Wt 168.6 lb

## 2015-02-01 DIAGNOSIS — C787 Secondary malignant neoplasm of liver and intrahepatic bile duct: Secondary | ICD-10-CM

## 2015-02-01 DIAGNOSIS — R4589 Other symptoms and signs involving emotional state: Secondary | ICD-10-CM

## 2015-02-01 DIAGNOSIS — C7951 Secondary malignant neoplasm of bone: Secondary | ICD-10-CM

## 2015-02-01 DIAGNOSIS — B370 Candidal stomatitis: Secondary | ICD-10-CM

## 2015-02-01 DIAGNOSIS — R739 Hyperglycemia, unspecified: Secondary | ICD-10-CM

## 2015-02-01 DIAGNOSIS — C50912 Malignant neoplasm of unspecified site of left female breast: Secondary | ICD-10-CM

## 2015-02-01 DIAGNOSIS — C50919 Malignant neoplasm of unspecified site of unspecified female breast: Secondary | ICD-10-CM

## 2015-02-01 DIAGNOSIS — E119 Type 2 diabetes mellitus without complications: Secondary | ICD-10-CM

## 2015-02-01 DIAGNOSIS — D709 Neutropenia, unspecified: Secondary | ICD-10-CM

## 2015-02-01 LAB — COMPREHENSIVE METABOLIC PANEL
ALBUMIN: 2.4 g/dL — AB (ref 3.5–5.0)
ALK PHOS: 408 U/L — AB (ref 38–126)
ALT: 143 U/L — ABNORMAL HIGH (ref 14–54)
ANION GAP: 12 (ref 5–15)
AST: 112 U/L — AB (ref 15–41)
BUN: 32 mg/dL — ABNORMAL HIGH (ref 6–20)
CO2: 32 mmol/L (ref 22–32)
Calcium: 8.9 mg/dL (ref 8.9–10.3)
Chloride: 90 mmol/L — ABNORMAL LOW (ref 101–111)
Creatinine, Ser: 0.83 mg/dL (ref 0.44–1.00)
GFR calc Af Amer: >60 mL/min (ref >60)
GFR calc non Af Amer: >60 mL/min (ref >60)
Glucose, Bld: 524 mg/dL — ABNORMAL HIGH (ref 65–99)
Potassium: 4.3 mmol/L (ref 3.5–5.1)
SODIUM: 134 mmol/L — AB (ref 135–145)
Total Bilirubin: 1.6 mg/dL — ABNORMAL HIGH (ref 0.3–1.2)
Total Protein: 6.3 g/dL — ABNORMAL LOW (ref 6.5–8.1)

## 2015-02-01 LAB — CBC WITH DIFFERENTIAL/PLATELET
Basophils Absolute: 0.1 10*3/uL (ref 0.0–0.1)
Basophils Relative: 9 % — ABNORMAL HIGH (ref 0–1)
Eosinophils Absolute: 0 10*3/uL (ref 0.0–0.7)
Eosinophils Relative: 0 % (ref 0–5)
HCT: 37.7 % (ref 36.0–46.0)
HEMOGLOBIN: 12.8 g/dL (ref 12.0–15.0)
LYMPHS PCT: 52 % — AB (ref 12–46)
Lymphs Abs: 0.5 10*3/uL — ABNORMAL LOW (ref 0.7–4.0)
MCH: 26.8 pg (ref 26.0–34.0)
MCHC: 34 g/dL (ref 30.0–36.0)
MCV: 78.9 fL (ref 78.0–100.0)
MONOS PCT: 6 % (ref 3–12)
Monocytes Absolute: 0.1 10*3/uL (ref 0.1–1.0)
Neutro Abs: 0.3 10*3/uL — ABNORMAL LOW (ref 1.7–7.7)
Neutrophils Relative %: 33 % — ABNORMAL LOW (ref 43–77)
PLATELETS: 245 10*3/uL (ref 150–400)
RBC: 4.78 MIL/uL (ref 3.87–5.11)
RDW: 15.5 % (ref 11.5–15.5)
WBC: 1 10*3/uL — CL (ref 4.0–10.5)

## 2015-02-01 LAB — GLUCOSE, RANDOM: Glucose, Bld: 478 mg/dL — ABNORMAL HIGH (ref 65–99)

## 2015-02-01 MED ORDER — INSULIN ASPART 100 UNIT/ML ~~LOC~~ SOLN
15.0000 [IU] | Freq: Once | SUBCUTANEOUS | Status: AC
  Administered 2015-02-01: 15 [IU] via SUBCUTANEOUS
  Filled 2015-02-01: qty 0.15

## 2015-02-01 MED ORDER — SODIUM CHLORIDE 0.9 % IV SOLN
INTRAVENOUS | Status: DC
  Administered 2015-02-01: 12:00:00 via INTRAVENOUS

## 2015-02-01 MED ORDER — INSULIN ASPART 100 UNIT/ML ~~LOC~~ SOLN
15.0000 [IU] | Freq: Once | SUBCUTANEOUS | Status: DC
  Filled 2015-02-01: qty 0.15

## 2015-02-01 MED ORDER — INSULIN ASPART 100 UNIT/ML FLEXPEN: PEN_INJECTOR | SUBCUTANEOUS | Status: DC

## 2015-02-01 MED ORDER — FLUCONAZOLE IN SODIUM CHLORIDE 400-0.9 MG/200ML-% IV SOLN
200.0000 mg | Freq: Once | INTRAVENOUS | Status: AC
  Administered 2015-02-01: 200 mg via INTRAVENOUS
  Filled 2015-02-01: qty 100

## 2015-02-01 MED ORDER — FLUCONAZOLE 40 MG/ML PO SUSR: 100.0000 mg | Freq: Every day | ORAL | Status: DC

## 2015-02-01 MED ORDER — HEPARIN SOD (PORK) LOCK FLUSH 100 UNIT/ML IV SOLN
INTRAVENOUS | Status: AC
  Filled 2015-02-01: qty 5

## 2015-02-01 MED ORDER — HEPARIN SOD (PORK) LOCK FLUSH 100 UNIT/ML IV SOLN
500.0000 [IU] | Freq: Once | INTRAVENOUS | Status: AC
  Administered 2015-02-01: 500 [IU] via INTRAVENOUS

## 2015-02-01 NOTE — Progress Notes (Signed)
1252 ..Brenda Avery presents today for injection per the provider's orders.  novolog 15 units  administration without incident; see MAR for injection details.   1606 Novolog 15 units subcutaneous for glucose of 478  Patient tolerated infusion well and instructions given for decadron taper, insulin sliding scale, and prescription given for glucose meter. Verbalized understanding

## 2015-02-01 NOTE — Progress Notes (Signed)
CRITICAL VALUE ALERT Critical value received:  WBC 1.0 Date of notification:  02/01/2015 Time of notification: 1110 Critical value read back:  Yes.   Nurse who received alert:  Sherry Ruffing RN MD notified (1st page):  Dr Whitney Muse

## 2015-02-01 NOTE — Progress Notes (Signed)
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 She also has suspicious bone lesions but not easily accessible acc.to IR.  Therefore we are giving her the benefit of the doubt and treating her with curative intent. MRI suggests nodal involvement in the axilla.      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 Liver metastases   09/14/2012 Cancer Staging PET scan- multiple hypermetabolic areas left pectoral node the breast mass, left hilar, suprahilar, right hilar, right suprahilar, subcarinal lymph node, liver, right iliac, bilateral inguinal nodes, sternum lytic lesion    09/19/2012 Procedure Liver biopsy- Sarcoidosis   09/26/2012 - 01/10/2013 Chemotherapy AC x 4 followed by weekly Paclitaxel x 12. Marker response supraclavicular and mediastinal nodes decreasing the breast mass and liver lesions resolution of sternal activity   04/10/2013 Surgery Bilateral mastectomies, left decidual IDC grade 2 x 0.7 cm with high-grade DCIS LV I 8/11 lymph nodes positive with extracapsular extension ER 99% PR 40%, HER-2 negative. R: No cancer   04/12/2013 Procedure Genetic testing did not reveal BRCA mutations   05/22/2013 - 06/23/2013 Radiation Therapy Adjuvant radiation therapy with Xeloda   06/23/2013 -  Anti-estrogen oral  therapy Tamoxifen 20 mg daily with Zoladex every 3 months   05/28/2014 Relapse/Recurrence Bony metastatic disease: Sternum biopsy metastatic carcinoma, HER-2 negative ratio 0.74   06/15/2014 -  Anti-estrogen oral therapy Ibrance, faslodex, xgeva   01/16/2015 Initial Biopsy Successful ultrasound right hepatic biopsy. Diagnosis Liver, needle/core biopsy, right mass - POSITIVE FOR METASTATIC CARCINOMA. Estrogen Receptor: 100%, POSITIVE, STRONG STAINING INTENSITY (PERFORMED MANUALLY) Progesterone Receptor: 0%, NEGATIVE     01/24/2015 -  Chemotherapy Halaven with Neulasta support     CHIEF COMPLAINTS/PURPOSE OF CONSULTATION:  Stage IV Breast Cancer XGEVA, Faslodex and Ibrance Initial Diagnosis and Treatment in early 2014. Received AC X 4 followed by weekly Taxol  HISTORY OF PRESENTING ILLNESS:  Brenda Avery 44 y.o. female is here because of Stage IV Breast cancer with significant progression in her liver and declining performance status.  She is here today with her husband. She has been trying to walk, eat, and do things. She has been eating a lot of fruit and enjoys ice cold milk. Her mouth has been causing her issues. She isn't able to swallow pills. She has had some swelling in her feet. She hasn't had any abdominal pain and her previous pain in the side is much better.  She hasn't been experiencing shortness of breath anymore.  She says that someone stays with her at night and her family helps her.  She wants her kids to enjoy life, her children are 39 and 11 yo. They do not talk much about her "being sick."  She notes she tries to "be good."  She just cannot make herself do  the things she desires. She 'just wants to be her normal self again'. She is very sad and overwhelmed about her sickness.    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 Liver metastases   09/14/2012 Cancer  Staging PET scan- multiple hypermetabolic areas left pectoral node the breast mass, left hilar, suprahilar, right hilar, right suprahilar, subcarinal lymph node, liver, right iliac, bilateral inguinal nodes, sternum lytic lesion    09/19/2012 Procedure Liver biopsy- Sarcoidosis   09/26/2012 - 01/10/2013 Chemotherapy AC x 4 followed by weekly Paclitaxel x 12. Marker response supraclavicular and mediastinal nodes decreasing the breast mass and liver lesions resolution of sternal activity   04/10/2013 Surgery Bilateral mastectomies, left decidual IDC grade 2 x 0.7 cm with high-grade DCIS LV I 8/11 lymph nodes positive with extracapsular extension ER 99% PR 40%, HER-2 negative. R: No cancer   04/12/2013 Procedure Genetic testing did not reveal BRCA mutations   05/22/2013 - 06/23/2013 Radiation Therapy Adjuvant radiation therapy with Xeloda   06/23/2013 -  Anti-estrogen oral therapy Tamoxifen 20 mg daily with Zoladex every 3 months   05/28/2014 Relapse/Recurrence Bony metastatic disease: Sternum biopsy metastatic carcinoma, HER-2 negative ratio 0.74   06/15/2014 -  Anti-estrogen oral therapy Ibrance, faslodex, xgeva   01/16/2015 Initial Biopsy Successful ultrasound right hepatic biopsy. Diagnosis Liver, needle/core biopsy, right mass - POSITIVE FOR METASTATIC CARCINOMA. Estrogen Receptor: 100%, POSITIVE, STRONG STAINING INTENSITY (PERFORMED MANUALLY) Progesterone Receptor: 0%, NEGATIVE     01/24/2015 -  Chemotherapy Halaven with Neulasta support     MEDICAL HISTORY:  Past Medical History  Diagnosis Date  . Hyperlipidemia     diet therapy per patient request  . 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 Hematuria 2000/2001    evaluted in [place] with negative results  . Breast CA     left  . S/P radiation therapy  05/10/2013-06/23/2013    Left chest wall / 50.4 Gray @ 1.8 Gray per fraction x 28 fractions/ Left Supraclavicular fossa / 97 Gray @1 .8 Gray per fraction x 25 fractions/ Left PAB / 45 Gy at 1.8  Gray per fraction x 25 fractions/Left scar / 10 Gray at [employer] per fraction x 5 fractions     . Status post chemotherapy      Radiation Therapy Radiosensitizing Xeloda  . Status post chemotherapy  09/26/2012 - 11/07/2012      Neoadjuvant chemotherapy consisting of 4 cycles of a.c. From 09/26/2012 through 11/07/2012 she then received 12 weeks of paclitaxel.  . Use of tamoxifen (Nolvadex) started 06/26/13  . Type II diabetes mellitus     diet therapy     SURGICAL HISTORY: Past Surgical History  Procedure Laterality Date  . Rectocele repair  2004  . Cystoscopy  2001  . Nevus excision  2004    facial  . Portacath placement  09/20/2012    Procedure: INSERTION PORT-A-CATH;  Surgeon: Adin Hector, MD;  Location: [place];  Service: General;  Laterality: N/A;  port a cath insertion with flouro and ultrasound   . Mastectomy Right 04/10/2013  . Mastectomy modified radical Left 04/10/2013  . Axillary sentinel node biopsy Right 04/10/2013  . Liver biopsy    . Breast biopsy Left 2014  . Tubal ligation  2004  . Mastectomy modified radical Left 04/10/2013    Procedure: LEFT MODIFIED RADICAL MASTECTOMY ;  Surgeon: Adin Hector, MD;  Location: [facility];  Service: General;  Laterality: Left;  . Simple mastectomy with axillary sentinel node biopsy Right 04/10/2013    Procedure: RIGHT TOTAL  MASTECTOMY WITH AXILLARY SENTINEL NODE BIOPSY;  Surgeon: Adin Hector, MD;  Location: [place];  Service: General;  Laterality: Right;  Methylene Blue Injection    SOCIAL HISTORY: History   Social History  . Marital Status: Married    Spouse Name: 2  . Number of Children: N/A  . Years of Education: N/A   Occupational History  . Nursing Assistant at [employer]  . Smoking status: Never Smoker   . Smokeless tobacco: Never Used  . Alcohol Use: No  . Drug Use: No  . Sexual Activity: Not Currently    Birth Control/ Protection: Surgical     Comment: menarche age 51, P89   Other  Topics Concern  . Not on file   Social History Narrative    FAMILY HISTORY: Family History  Problem Relation Age of Onset  . Hypertension Mother   . Diabetes Mother     MI  . Heart disease Mother   . Hypertension Sister     x4 only one HTN and one thats DI  . Hypertension Brother     x4 only 1 htn & dm  . Heart disease Father   . Breast cancer Maternal Aunt     diagnosed in her 68s  . Cancer Maternal Aunt     unknown  . Breast cancer Paternal Aunt     diagnosed in her 94s  . Cervical cancer Sister 29  . Breast cancer Maternal Aunt     diagnosed in her 68s  . Breast cancer Cousin     3 maternal cousins - 2 in their 34s, 1 in her 22s   indicated that her mother is deceased. She indicated that her father is deceased. She indicated that both of her sisters are alive. She indicated that only one of her two maternal aunts is alive. She indicated that her paternal aunt is deceased. She indicated that her cousin is deceased.   ALLERGIES:  is allergic to metronidazole and sulfonamide derivatives.  MEDICATIONS:  Current Outpatient Prescriptions  Medication Sig Dispense Refill  . Diphenhyd-Hydrocort-Nystatin (FIRST-DUKES MOUTHWASH) SUSP Swish and swallow 69m four times a day. 360 mL 1  . lidocaine-prilocaine (EMLA) cream Apply 1 application topically as needed. 30 g 0  . ondansetron (ZOFRAN-ODT) 8 MG disintegrating tablet Take 1 tablet (8 mg total) by mouth every 8 (eight) hours as needed for nausea or vomiting. 30 tablet 0  . pantoprazole (PROTONIX) 40 MG tablet Take 1 tablet (40 mg total) by mouth daily. 30 tablet 0  . polyethylene glycol (MIRALAX / GLYCOLAX) packet Take 17 g by mouth daily as needed for mild constipation. 14 each 0  . promethazine (PHENERGAN) 25 MG suppository Place 25 mg rectally every 8 (eight) hours as needed for nausea or vomiting.   0  . promethazine (PHENERGAN) 25 MG tablet Take 1 tablet (25 mg total) by mouth every 6 (six) hours as needed for nausea or  vomiting. 60 tablet 2  . traMADol (ULTRAM) 50 MG tablet Take 50 mg by mouth every 12 (twelve) hours as needed for moderate pain.     .Marland KitchenBAYER CONTOUR TEST test strip   0  . calcium-vitamin D (OSCAL WITH D) 500-200 MG-UNIT per tablet Take 2 tablets by mouth 2 (two) times daily. 90 tablet 0  . dexamethasone (  DECADRON) 4 MG tablet Take 26m po daily for 1 week then take 48mevery other day 30 tablet 0  . fentaNYL (DURAGESIC) 12 MCG/HR Place 1 patch (12.5 mcg total) onto the skin every 3 (three) days. 5 patch 0  . fluconazole (DIFLUCAN) 40 MG/ML suspension Take 2.5 mLs (100 mg total) by mouth daily. Total of 14 days 35 mL 0  . insulin aspart (NOVOLOG) 100 UNIT/ML FlexPen Use in accordance to provided sliding scale 15 mL 11  . levofloxacin (LEVAQUIN) 750 MG tablet Take 1 tablet (750 mg total) by mouth daily. 2 tablet 0  . Potassium Bicarb-Citric Acid 20 MEQ TBEF Take 1 tablet (20 mEq total) by mouth 2 (two) times daily. 60 each 3   Current Facility-Administered Medications  Medication Dose Route Frequency Provider Last Rate Last Dose  . 0.9 %  sodium chloride infusion   Intravenous Continuous ShPatrici RanksMD   Stopped at 02/01/15 1615    Review of Systems  Constitutional: Positive for malaise/fatigue. Negative for weight loss.   HENT: Negative for congestion, ear discharge, ear pain, hearing loss, nosebleeds, sore throat and tinnitus.   Eyes: Negative.   Respiratory: Negative for stridor.   Cardiovascular: Negative.   Gastrointestinal: Positive for nausea and diarrhea.       Decreased appetite due to sore mouth.  Genitourinary: Negative.   Musculoskeletal: Negative.  Skin: Negative.   Neurological: Positive for weakness. Negative for dizziness, tingling, tremors, sensory change, speech change, focal weakness, seizures, loss of consciousness and headaches.  Endo/Heme/Allergies: Negative.   Psychiatric/Behavioral: Positive for depression. Negative for suicidal ideas, hallucinations, memory  loss and substance abuse. The patient has insomnia. The patient is not nervous/anxious.    14 point ROS was done and is otherwise as detailed above or in HPI   PHYSICAL EXAMINATION:  ECOG PERFORMANCE STATUS: 3 - Symptomatic, >50% confined to bed  Filed Vitals:   02/01/15 1030  BP: 106/81  Pulse: 120  Temp: 97.9 F (36.6 C)  Resp: 14   Filed Weights   02/01/15 1030  Weight: 168 lb 9.6 oz (76.476 kg)    Physical Exam  Constitutional: She is oriented to person, place, and time, lying in a treatment bed HENT:  Head: Normocephalic and atraumatic.  Nose: Nose normal.  Mouth/Throat: Oropharynx is clear and moist. No oropharyngeal exudate.  Thrush Eyes: Conjunctivae and EOM are normal. Pupils are equal, round, and reactive to light. Right eye exhibits no discharge. Left eye exhibits no discharge. No scleral icterus.  Neck: Normal range of motion. Neck supple. No tracheal deviation present. No thyromegaly present.  Cardiovascular: Normal rate, regular rhythm and normal heart sounds.  Exam reveals no gallop and no friction rub.   No murmur heard. Pulmonary/Chest: Effort normal and breath sounds normal. She has no wheezes. She has no rales.  Abdominal: Soft. Bowel sounds are normal. She exhibits no distension and no mass. There is tenderness. There is no rebound and no guarding.  Palpable liver edge.  Discomfort with abdominal palpation particularly over the RUQ  Musculoskeletal: Normal range of motion. She exhibits no edema.  Lymphadenopathy:    She has no cervical adenopathy.  Neurological: She is alert and oriented to person, place, and time. She has normal reflexes. No cranial nerve deficit.  Skin: Skin is warm and dry. No rash noted.  Psychiatric: Mood, memory, affect and judgment normal.  Nursing note and vitals reviewed.    LABORATORY DATA:  I have reviewed the data as listed Results for [place],  Brenda Avery (MRN [id number]) as of 02/18/2015 14:59  Ref. Range 02/01/2015 10:30    Sodium Latest Ref Range: 135-145 mmol/L 134 (L)  Potassium Latest Ref Range: 3.5-5.1 mmol/L 4.3  Chloride Latest Ref Range: 101-111 mmol/L 90 (L)  CO2 Latest Ref Range: 22-32 mmol/L 32  BUN Latest Ref Range: 6-20 mg/dL 32 (H)  Creatinine Latest Ref Range: 0.44-1.00 mg/dL 0.83  Calcium Latest Ref Range: 8.9-10.3 mg/dL 8.9  EGFR (Non-African Amer.) Latest Ref Range: >60 mL/min >60  EGFR (African American) Latest Ref Range: >60 mL/min >60  Glucose Latest Ref Range: 65-99 mg/dL 524 (H)  Anion gap Latest Ref Range: 5-15  12  Alkaline Phosphatase Latest Ref Range: 38-126 U/L 408 (H)  Albumin Latest Ref Range: 3.5-5.0 g/dL 2.4 (L)  AST Latest Ref Range: 15-41 U/L 112 (H)  ALT Latest Ref Range: 14-54 U/L 143 (H)  Total Protein Latest Ref Range: 6.5-8.1 g/dL 6.3 (L)  Total Bilirubin Latest Ref Range: 0.3-1.2 mg/dL 1.6 (H)  WBC Latest Ref Range: 4.0-10.5 K/uL 1.0 (LL)  RBC Latest Ref Range: 3.87-5.11 MIL/uL 4.78  Hemoglobin Latest Ref Range: 12.0-15.0 g/dL 12.8  HCT Latest Ref Range: 36.0-46.0 % 37.7  MCV Latest Ref Range: 78.0-100.0 fL 78.9  MCH Latest Ref Range: 26.0-34.0 pg 26.8  MCHC Latest Ref Range: 30.0-36.0 g/dL 34.0  RDW Latest Ref Range: 11.5-15.5 % 15.5  Platelets Latest Ref Range: 150-400 K/uL 245  Neutrophils Latest Ref Range: 43-77 % 33 (L)  Lymphocytes Latest Ref Range: 12-46 % 52 (H)  Monocytes Relative Latest Ref Range: 3-12 % 6  Eosinophil Latest Ref Range: 0-5 % 0  Basophil Latest Ref Range: 0-1 % 9 (H)  NEUT# Latest Ref Range: 1.7-7.7 K/uL 0.3 (L)  Lymphocyte # Latest Ref Range: 0.7-4.0 K/uL 0.5 (L)  Monocyte # Latest Ref Range: 0.1-1.0 K/uL 0.1  Eosinophils Absolute Latest Ref Range: 0.0-0.7 K/uL 0.0  Basophils Absolute Latest Ref Range: 0.0-0.1 K/uL 0.1    RADIOGRAPHIC STUDIES: I have personally reviewed the radiological images as listed and agreed with the findings in the report.  12/03/2014 CLINICAL DATA: Left breast cancer diagnosed 2014, status  post bilateral mastectomy, XRT complete 01/2013, oral chemotherapy in progress  EXAM: CT CHEST, ABDOMEN, AND PELVIS WITH CONTRAST  IMPRESSION: Status post mastectomy with bilateral axillary lymph node dissection.  Small mediastinal lymph nodes, non FDG avid on recent PET, indeterminate.  Multifocal hepatic lesions, highly suspicious for hepatic metastases, as above.  Innumerable hypoenhancing lesions in the cyst spleen, possibly reflecting infiltrating disease such as sarcoidosis or amyloid. Metastatic disease is considered less likely.  Progression of upper abdominal/retroperitoneal lymphadenopathy, suspicious for nodal metastases.  Mixed lytic/sclerotic metastasis in the right sternum. Additional known osseous metastases in the bilateral pelvis are not apparent on CT.   Electronically Signed  By: Julian Hy M.D.  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 I again emphasized that treatment at this point is palliative. I also spent time discussing the possibility that she may not be able to tolerate therapy. She is neutropenic today and therefore will not be treated. She has oral thrush and we are working on tapering her steroids. She will be given a dose  of Diflucan IV in the clinic given her difficulty swallowing pills and will be sent home with a prescription for liquid Diflucan.  I have a strong suspicion she will need ongoing treatment of peripheral blood sugars. I discussed this with her and she was very upset about this. She notes she has been told she was a borderline diabetic in the past. Both of her parents  have diabetes. She was not willing to be admitted today to control her blood sugars and therefore we will hydrate her in the clinic and treated her here. She will be given sliding scale insulin to use through the weekend. We will continue to taper her Decadron.  She has very poor insight into the fact that her disease is terminal and the timeframe is sooner rather than later. She and her husband were more open to discussing this today but continue to change the subject when things become difficult. The patient feels that the Reita Cliche is going to completely heal her.  I will see her back next week. They were instructed to go to the emergency department with any difficulties through the weekend such as persistent blood sugars greater than 300, nausea, vomiting, or uncontrolled pain or fever.  All questions were answered. The patient knows to call the clinic with any problems, questions or concerns. This note was electronically signed.    This document serves as a record of services personally performed by Ancil Linsey, MD. It was created on her behalf by Arlyce Harman, a trained medical scribe. The creation of this record is based on the scribe's personal observations and the provider's statements to them. This document has been checked and approved by the attending provider.  I have reviewed the above documentation for accuracy and completeness, and I agree with the above.  Molli Hazard, MD  02/18/2015 2:58 PM

## 2015-02-01 NOTE — Patient Instructions (Addendum)
..  [place] at [facility] Discharge Instructions  RECOMMENDATIONS MADE BY THE CONSULTANT AND ANY TEST RESULTS WILL BE SENT TO YOUR REFERRING PHYSICIAN.  Discussion per Dr. Whitney Muse today We need to get your blood sugar controlled and taper you down off of the dexamethasone-follow Dr. Donald Pore written instructions- -take decadron  4 mg tablets twice a day for 4 days then one 4 mg tablet once daily thereafter until you see me again Labs on Monday We will send you with prescription for a blood sugar monitor and supplies that you can get from [place] -- follow the sliding scale insulin guideline per Dr. Donald Pore instructions We will also send prescription to [facility] for diflucan and insulin     Thank you for choosing [place] at [facility] to provide your oncology and hematology care.  To afford each patient quality time with our provider, please arrive at least 15 minutes before your scheduled appointment time.    You need to re-schedule your appointment should you arrive 10 or more minutes late.  We strive to give you quality time with our providers, and arriving late affects you and other patients whose appointments are after yours.  Also, if you no show three or more times for appointments you may be dismissed from the clinic at the providers discretion.     Again, thank you for choosing [facility]  Our hope is that these requests will decrease the amount of time that you wait before being seen by our physicians.       _____________________________________________________________  Should you have questions after your visit to [facility], please contact our office at [phone] between the hours of 8:30 a.m. and 4:30 p.m.  Voicemails left after 4:30 p.m. will not be returned until the following business day.  For prescription refill requests, have your pharmacy contact our office.

## 2015-02-04 ENCOUNTER — Encounter (HOSPITAL_BASED_OUTPATIENT_CLINIC_OR_DEPARTMENT_OTHER): Payer: BLUE CROSS/BLUE SHIELD

## 2015-02-04 ENCOUNTER — Other Ambulatory Visit (INDEPENDENT_AMBULATORY_CARE_PROVIDER_SITE_OTHER): Payer: Self-pay | Admitting: *Deleted

## 2015-02-04 VITALS — BP 96/59 | HR 138 | Temp 98.1°F | Resp 20

## 2015-02-04 DIAGNOSIS — E119 Type 2 diabetes mellitus without complications: Secondary | ICD-10-CM | POA: Diagnosis not present

## 2015-02-04 DIAGNOSIS — C50912 Malignant neoplasm of unspecified site of left female breast: Secondary | ICD-10-CM | POA: Diagnosis not present

## 2015-02-04 DIAGNOSIS — K219 Gastro-esophageal reflux disease without esophagitis: Secondary | ICD-10-CM

## 2015-02-04 LAB — CBC WITH DIFFERENTIAL/PLATELET
BASOS PCT: 1 % (ref 0–1)
Basophils Absolute: 0 10*3/uL (ref 0.0–0.1)
EOS ABS: 0 10*3/uL (ref 0.0–0.7)
Eosinophils Relative: 0 % (ref 0–5)
HCT: 36.3 % (ref 36.0–46.0)
Hemoglobin: 12.6 g/dL (ref 12.0–15.0)
Lymphocytes Relative: 40 % (ref 12–46)
Lymphs Abs: 0.7 10*3/uL (ref 0.7–4.0)
MCH: 27.2 pg (ref 26.0–34.0)
MCHC: 34.7 g/dL (ref 30.0–36.0)
MCV: 78.4 fL (ref 78.0–100.0)
MONO ABS: 0.2 10*3/uL (ref 0.1–1.0)
MONOS PCT: 14 % — AB (ref 3–12)
NEUTROS ABS: 0.7 10*3/uL — AB (ref 1.7–7.7)
Neutrophils Relative %: 45 % (ref 43–77)
Platelets: 217 10*3/uL (ref 150–400)
RBC: 4.63 MIL/uL (ref 3.87–5.11)
RDW: 15.9 % — ABNORMAL HIGH (ref 11.5–15.5)
WBC: 1.7 10*3/uL — ABNORMAL LOW (ref 4.0–10.5)

## 2015-02-04 MED ORDER — INSULIN ASPART 100 UNIT/ML ~~LOC~~ SOLN
12.0000 [IU] | Freq: Once | SUBCUTANEOUS | Status: AC
  Administered 2015-02-04: 12 [IU] via SUBCUTANEOUS
  Filled 2015-02-04: qty 0.12

## 2015-02-04 MED ORDER — POTASSIUM BICARB-CITRIC ACID 20 MEQ PO TBEF: 20.0000 meq | EFFERVESCENT_TABLET | Freq: Two times a day (BID) | ORAL | Status: DC

## 2015-02-04 MED ORDER — FENTANYL 12 MCG/HR TD PT72: 12.5000 ug | MEDICATED_PATCH | TRANSDERMAL | Status: DC

## 2015-02-04 NOTE — Patient Instructions (Addendum)
[place] at [facility] Discharge Instructions  RECOMMENDATIONS MADE BY THE CONSULTANT AND ANY TEST RESULTS WILL BE SENT TO YOUR REFERRING PHYSICIAN.   Today you received 12 units of Novolog insulin.  We did a CBC (complete blood count)  We are contacting Dr. Laural Golden in regards to your throat burning.   You were given a prescription for the Fentanyl patch and a new prescription for Potassium was called in. It is called Effer-K. You will do one packet in the morning twice a day.   Dr. Whitney Muse does not want to put you on an appetite stimulating medication at this point.   You can buy chocolate calcium supplements like Viactiv or buy the gummy calcium supplements.   Vitals today: 99/67 HR 112 regular, R 20, T98.1 o2 sat 99%   Thank you for choosing [place] at [facility] to provide your oncology and hematology care.  To afford each patient quality time with our provider, please arrive at least 15 minutes before your scheduled appointment time.    You need to re-schedule your appointment should you arrive 10 or more minutes late.  We strive to give you quality time with our providers, and arriving late affects you and other patients whose appointments are after yours.  Also, if you no show three or more times for appointments you may be dismissed from the clinic at the providers discretion.     Again, thank you for choosing [facility].  Our hope is that these requests will decrease the amount of time that you wait before being seen by our physicians.       _____________________________________________________________  Should you have questions after your visit to [facility], please contact our office at [phone] between the hours of 8:30 a.m. and 4:30 p.m.  Voicemails left after 4:30 p.m. will not be returned until the following business day.  For prescription refill requests, have your pharmacy contact our  office.         Potassium Salts effervescent tablets What is this medicine? POTASSIUM (poe TASS i um) is a natural salt that is important for the heart, muscles, and nerves. Too much or too little potassium in the body can cause serious problems. Potassium is found in many foods and is normally supplied by a balanced diet. This medicine is a potassium supplement that is used to prevent and to treat low potassium. This medicine may be used for other purposes; ask your health care provider or pharmacist if you have questions. COMMON BRAND NAME(S): K Plus Care ET, K-Lyte, K-Lyte CL, K-Lyte DS, K-Vescent, Klor-Con EF What should I tell my health care provider before I take this medicine? They need to know if you have any of these conditions: -dehydration -diabetes -irregular heartbeat -kidney disease -stomach ulcers or other stomach problems -an unusual or allergic reaction to potassium, tartrazine, other medicines, foods, dyes, or preservatives -pregnant or trying to get pregnant -breast-feeding How should I use this medicine? Take this medicine by mouth with food. Follow the directions on the prescription label. Add one tablet to a glass with 3 to 4 ounces of cold water. Allow the medicine to dissolve completely. Sip the mixture slowly over 5 to 10 minutes. Do not open the foil pouch until you are ready to take that dose. Take your medicine at regular intervals. Do not take your medicine more often than directed. Do not stop taking except on your doctor's advice. Talk  to your pediatrician or health care professional regarding the use of this medicine in children. Special care may be needed. Overdosage: If you think you have taken too much of this medicine contact a poison control center or emergency room at once. NOTE: This medicine is only for you. Do not share this medicine with others. What if I miss a dose? If you miss a dose, take it as soon as you can. If it is almost time for your  next dose, take only that dose. Do not take double or extra doses. What may interact with this medicine? Do not take this medicine with any of the following medications: -eplerenone -sodium polystyrene sulfonate This medicine may also interact with the following medications: -heart medicines -medicines for cold or allergies -medicines for Parkinson's disease -medicines for the stomach like metoclopramide, dicyclomine, glycopyrrolate -NSAIDs, medicines for pain and inflammation, like ibuprofen or naproxen -other potassium supplements -salt substitutes -some diuretics This list may not describe all possible interactions. Give your health care provider a list of all the medicines, herbs, non-prescription drugs, or dietary supplements you use. Also tell them if you smoke, drink alcohol, or use illegal drugs. Some items may interact with your medicine. What should I watch for while using this medicine? Visit your doctor or health care professional for regular check ups. You will need lab work done regularly. You may need to be on a special diet while taking this medicine. Ask your doctor. Side effects like stomach upset, nausea and diarrhea happen more when this medicine is not mixed properly or not taken with food. Follow mixing instructions exactly each time you take a dose. What side effects may I notice from receiving this medicine? Side effects that you should report to your doctor or health care professional as soon as possible: -allergic reactions like skin rash, itching or hives, swelling of the face, lips, or tongue -anxiety, confusion -black, tarry stools -breathing problems -heartburn -irregular heartbeat -numbness or tingling in lips, hands or feet -pain when swallowing -unusually weak or tired -weakness, heaviness of legs Side effects that usually do not require medical attention (report to your doctor or health care professional if they continue or are  bothersome): -diarrhea -nausea -stomach upset -vomiting This list may not describe all possible side effects. Call your doctor for medical advice about side effects. You may report side effects to FDA at 1-800-FDA-1088. Where should I keep my medicine? Keep out of the reach of children. Store at room temperature between 15 and 30 degrees C (59 and 86 degrees F). Do not open foil pouch until ready to use to protect this medicine from light and moisture. Throw away any unused medicine after the expiration date. NOTE: This sheet is a summary. It may not cover all possible information. If you have questions about this medicine, talk to your doctor, pharmacist, or health care provider.  2015, [employer]. (2007-10-19 11:18:39)

## 2015-02-04 NOTE — Progress Notes (Signed)
Brenda Avery presents today for injection per MD orders. Novolog 12 units administered SQ in left Abdomen. Administration without incident. Patient tolerated well.

## 2015-02-05 ENCOUNTER — Ambulatory Visit (HOSPITAL_COMMUNITY)
Admission: RE | Admit: 2015-02-05 | Payer: BLUE CROSS/BLUE SHIELD | Source: Ambulatory Visit | Admitting: Internal Medicine

## 2015-02-05 SURGERY — EGD (ESOPHAGOGASTRODUODENOSCOPY)
Anesthesia: Moderate Sedation

## 2015-02-05 NOTE — Progress Notes (Signed)
LABS DRAWN

## 2015-02-06 ENCOUNTER — Encounter (HOSPITAL_BASED_OUTPATIENT_CLINIC_OR_DEPARTMENT_OTHER): Payer: BLUE CROSS/BLUE SHIELD | Admitting: Hematology & Oncology

## 2015-02-06 ENCOUNTER — Encounter (HOSPITAL_COMMUNITY): Payer: Self-pay | Admitting: Hematology & Oncology

## 2015-02-06 ENCOUNTER — Other Ambulatory Visit: Payer: Self-pay | Admitting: *Deleted

## 2015-02-06 VITALS — BP 99/66 | HR 119 | Temp 97.9°F | Resp 22 | Wt 168.9 lb

## 2015-02-06 DIAGNOSIS — B3789 Other sites of candidiasis: Secondary | ICD-10-CM

## 2015-02-06 DIAGNOSIS — C50912 Malignant neoplasm of unspecified site of left female breast: Secondary | ICD-10-CM

## 2015-02-06 DIAGNOSIS — C7951 Secondary malignant neoplasm of bone: Secondary | ICD-10-CM

## 2015-02-06 DIAGNOSIS — E876 Hypokalemia: Secondary | ICD-10-CM

## 2015-02-06 DIAGNOSIS — R739 Hyperglycemia, unspecified: Secondary | ICD-10-CM

## 2015-02-06 DIAGNOSIS — C787 Secondary malignant neoplasm of liver and intrahepatic bile duct: Secondary | ICD-10-CM | POA: Diagnosis not present

## 2015-02-06 LAB — CBC WITH DIFFERENTIAL/PLATELET
BASOS ABS: 0 10*3/uL (ref 0.0–0.1)
BASOS PCT: 0 % (ref 0–1)
EOS PCT: 0 % (ref 0–5)
Eosinophils Absolute: 0 10*3/uL (ref 0.0–0.7)
HCT: 38.1 % (ref 36.0–46.0)
Hemoglobin: 13.2 g/dL (ref 12.0–15.0)
Lymphocytes Relative: 26 % (ref 12–46)
Lymphs Abs: 1 10*3/uL (ref 0.7–4.0)
MCH: 27.3 pg (ref 26.0–34.0)
MCHC: 34.6 g/dL (ref 30.0–36.0)
MCV: 78.7 fL (ref 78.0–100.0)
Monocytes Absolute: 0.4 10*3/uL (ref 0.1–1.0)
Monocytes Relative: 10 % (ref 3–12)
Neutro Abs: 2.4 10*3/uL (ref 1.7–7.7)
Neutrophils Relative %: 64 % (ref 43–77)
PLATELETS: 233 10*3/uL (ref 150–400)
RBC: 4.84 MIL/uL (ref 3.87–5.11)
RDW: 16.1 % — AB (ref 11.5–15.5)
WBC: 3.8 10*3/uL — ABNORMAL LOW (ref 4.0–10.5)

## 2015-02-06 LAB — COMPREHENSIVE METABOLIC PANEL
ALT: 104 U/L — ABNORMAL HIGH (ref 14–54)
AST: 69 U/L — AB (ref 15–41)
Albumin: 2.3 g/dL — ABNORMAL LOW (ref 3.5–5.0)
Alkaline Phosphatase: 257 U/L — ABNORMAL HIGH (ref 38–126)
Anion gap: 11 (ref 5–15)
BUN: 15 mg/dL (ref 6–20)
CALCIUM: 8 mg/dL — AB (ref 8.9–10.3)
CHLORIDE: 86 mmol/L — AB (ref 101–111)
CO2: 31 mmol/L (ref 22–32)
Creatinine, Ser: 0.65 mg/dL (ref 0.44–1.00)
GFR calc Af Amer: >60 mL/min (ref >60)
Glucose, Bld: 294 mg/dL — ABNORMAL HIGH (ref 65–99)
POTASSIUM: 3.5 mmol/L (ref 3.5–5.1)
SODIUM: 128 mmol/L — AB (ref 135–145)
TOTAL PROTEIN: 6 g/dL — AB (ref 6.5–8.1)
Total Bilirubin: 1.6 mg/dL — ABNORMAL HIGH (ref 0.3–1.2)

## 2015-02-06 LAB — IRON AND TIBC
IRON: 99 ug/dL (ref 28–170)
Saturation Ratios: 42 % — ABNORMAL HIGH (ref 10.4–31.8)
TIBC: 235 ug/dL — ABNORMAL LOW (ref 250–450)
UIBC: 136 ug/dL

## 2015-02-06 LAB — FERRITIN: Ferritin: 2219 ng/mL — ABNORMAL HIGH (ref 11–307)

## 2015-02-06 NOTE — Progress Notes (Signed)
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 She also has suspicious bone lesions but not easily accessible acc.to IR.  Therefore we are giving her the benefit of the doubt and treating her with curative intent. MRI suggests nodal involvement in the axilla.      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 Liver metastases   09/14/2012 Cancer Staging PET scan- multiple hypermetabolic areas left pectoral node the breast mass, left hilar, suprahilar, right hilar, right suprahilar, subcarinal lymph node, liver, right iliac, bilateral inguinal nodes, sternum lytic lesion    09/19/2012 Procedure Liver biopsy- Sarcoidosis   09/26/2012 - 01/10/2013 Chemotherapy AC x 4 followed by weekly Paclitaxel x 12. Marker response supraclavicular and mediastinal nodes decreasing the breast mass and liver lesions resolution of sternal activity   04/10/2013 Surgery Bilateral mastectomies, left decidual IDC grade 2 x 0.7 cm with high-grade DCIS LV I 8/11 lymph nodes positive with extracapsular extension ER 99% PR 40%, HER-2 negative. R: No cancer   04/12/2013 Procedure Genetic testing did not reveal BRCA mutations   05/22/2013 - 06/23/2013 Radiation Therapy Adjuvant radiation therapy with Xeloda   06/23/2013 -  Anti-estrogen oral  therapy Tamoxifen 20 mg daily with Zoladex every 3 months   05/28/2014 Relapse/Recurrence Bony metastatic disease: Sternum biopsy metastatic carcinoma, HER-2 negative ratio 0.74   06/15/2014 -  Anti-estrogen oral therapy Ibrance, faslodex, xgeva   01/16/2015 Initial Biopsy Successful ultrasound right hepatic biopsy. Diagnosis Liver, needle/core biopsy, right mass - POSITIVE FOR METASTATIC CARCINOMA. Estrogen Receptor: 100%, POSITIVE, STRONG STAINING INTENSITY (PERFORMED MANUALLY) Progesterone Receptor: 0%, NEGATIVE     01/24/2015 -  Chemotherapy Halaven with Neulasta support     CHIEF COMPLAINTS/PURPOSE OF CONSULTATION:  Stage IV Breast Cancer XGEVA, Faslodex and Ibrance Initial Diagnosis and Treatment in early 2014. Received AC X 4 followed by weekly Taxol  HISTORY OF PRESENTING ILLNESS:  Brenda Avery 44 y.o. female is here because of Stage IV Breast cancer with significant progression in her liver and declining performance status. She feels as though she has more energy and her blood sugar has been manageable. Her throat is doing much better, though still mildly irritated with acidic foods.   She still has difficulty eating but has been eating fruit and vegetable smoothies. She has weaned her dexamethasone to one time daily. We have set her up for an EGD, she is waiting to hear on a date and time.  She has been talking with her kids and doing things with them, she mentions "loving it" and smiles talking about it. She has been talking much more with her husband about what is going on.  She has been trying to walk by herself at home. She admits this is very difficult because of leg weakness.She needs assistance to dress  herself. She is unable to do any care in her home, no cooking, cleaning etc.    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 Liver metastases   09/14/2012 Cancer  Staging PET scan- multiple hypermetabolic areas left pectoral node the breast mass, left hilar, suprahilar, right hilar, right suprahilar, subcarinal lymph node, liver, right iliac, bilateral inguinal nodes, sternum lytic lesion    09/19/2012 Procedure Liver biopsy- Sarcoidosis   09/26/2012 - 01/10/2013 Chemotherapy AC x 4 followed by weekly Paclitaxel x 12. Marker response supraclavicular and mediastinal nodes decreasing the breast mass and liver lesions resolution of sternal activity   04/10/2013 Surgery Bilateral mastectomies, left decidual IDC grade 2 x 0.7 cm with high-grade DCIS LV I 8/11 lymph nodes positive with extracapsular extension ER 99% PR 40%, HER-2 negative. R: No cancer   04/12/2013 Procedure Genetic testing did not reveal BRCA mutations   05/22/2013 - 06/23/2013 Radiation Therapy Adjuvant radiation therapy with Xeloda   06/23/2013 -  Anti-estrogen oral therapy Tamoxifen 20 mg daily with Zoladex every 3 months   05/28/2014 Relapse/Recurrence Bony metastatic disease: Sternum biopsy metastatic carcinoma, HER-2 negative ratio 0.74   06/15/2014 -  Anti-estrogen oral therapy Ibrance, faslodex, xgeva   01/16/2015 Initial Biopsy Successful ultrasound right hepatic biopsy. Diagnosis Liver, needle/core biopsy, right mass - POSITIVE FOR METASTATIC CARCINOMA. Estrogen Receptor: 100%, POSITIVE, STRONG STAINING INTENSITY (PERFORMED MANUALLY) Progesterone Receptor: 0%, NEGATIVE     01/24/2015 -  Chemotherapy Halaven with Neulasta support     MEDICAL HISTORY:  Past Medical History  Diagnosis Date  . Hyperlipidemia     diet therapy per patient request  . 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 Hematuria 2000/2001    evaluted in [place] with negative results  . Breast CA     left  . S/P radiation therapy  05/10/2013-06/23/2013    Left chest wall / 50.4 Gray @ 1.8 Gray per fraction x 28 fractions/ Left Supraclavicular fossa / 45 Gray @1 .8 Gray per fraction x 25 fractions/ Left PAB / 45 Gy at 1.8  Gray per fraction x 25 fractions/Left scar / 10 Gray at [employer] per fraction x 5 fractions     . Status post chemotherapy      Radiation Therapy Radiosensitizing Xeloda  . Status post chemotherapy  09/26/2012 - 11/07/2012      Neoadjuvant chemotherapy consisting of 4 cycles of a.c. From 09/26/2012 through 11/07/2012 she then received 12 weeks of paclitaxel.  . Use of tamoxifen (Nolvadex) started 06/26/13  . Type II diabetes mellitus     diet therapy   . Anxiety 02/20/2015  . Depression 02/20/2015    SURGICAL HISTORY: Past Surgical History  Procedure Laterality Date  . Rectocele repair  2004  . Cystoscopy  2001  . Nevus excision  2004    facial  . Portacath placement  09/20/2012    Procedure: INSERTION PORT-A-CATH;  Surgeon: Adin Hector, MD;  Location: [place];  Service: General;  Laterality: N/A;  port a cath insertion with flouro and ultrasound   . Mastectomy Right 04/10/2013  . Mastectomy modified radical Left 04/10/2013  . Axillary sentinel node biopsy Right 04/10/2013  . Liver biopsy    . Breast biopsy Left 2014  . Tubal ligation  2004  . Mastectomy modified radical Left 04/10/2013    Procedure: LEFT MODIFIED RADICAL MASTECTOMY ;  Surgeon: Adin Hector, MD;  Location: [facility] OR;  Service: General;  Laterality: Left;  . Simple mastectomy with axillary sentinel node biopsy Right 04/10/2013    Procedure: RIGHT TOTAL  MASTECTOMY WITH AXILLARY SENTINEL NODE BIOPSY;  Surgeon: Adin Hector, MD;  Location: [place];  Service: General;  Laterality: Right;  Methylene Blue Injection    SOCIAL HISTORY: History   Social History  . Marital Status: Married    Spouse Name: 2  . Number of Children: N/A  . Years of Education: N/A   Occupational History  . 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 Smoking status: Never Smoker   . Smokeless tobacco: Never Used  . Alcohol Use: No  . Drug Use: No  . Sexual Activity: Not Currently    Birth Control/ Protection: Surgical      Comment: menarche age 80, P81   Other Topics Concern  . Not on file   Social History Narrative    FAMILY HISTORY: Family History  Problem Relation Age of Onset  . Hypertension Mother   . Diabetes Mother     MI  . Heart disease Mother   . Hypertension Sister     x4 only one HTN and one thats DI  . Hypertension Brother     x4 only 1 htn & dm  . Heart disease Father   . Breast cancer Maternal Aunt     diagnosed in her 54s  . Cancer Maternal Aunt     unknown  . Breast cancer Paternal Aunt     diagnosed in her 10s  . Cervical cancer Sister 109  . Breast cancer Maternal Aunt     diagnosed in her 77s  . Breast cancer Cousin     3 maternal cousins - 2 in their 44s, 1 in her 60s   indicated that her mother is deceased. She indicated that her father is deceased. She indicated that both of her sisters are alive. She indicated that only one of her two maternal aunts is alive. She indicated that her paternal aunt is deceased. She indicated that her cousin is deceased.   ALLERGIES:  is allergic to metronidazole and sulfonamide derivatives.  MEDICATIONS:  Current Outpatient Prescriptions  Medication Sig Dispense Refill  . ALPRAZolam (XANAX) 0.5 MG tablet Take 1 tablet (0.5 mg total) by mouth 3 (three) times daily as needed for anxiety. 60 tablet 1  . BAYER CONTOUR TEST test strip   0  . calcium-vitamin D (OSCAL WITH D) 500-200 MG-UNIT per tablet Take 2 tablets by mouth 2 (two) times daily. 90 tablet 0  . dexamethasone (DECADRON) 4 MG tablet Take 74m po daily for 1 week then take 4341mevery other day 30 tablet 0  . Diphenhyd-Hydrocort-Nystatin (FIRST-DUKES MOUTHWASH) SUSP Swish and swallow 41m241mour times a day. 360 mL 1  . escitalopram (LEXAPRO) 10 MG tablet Take 1 tablet (10 mg total) by mouth daily. 30 tablet 2  . fentaNYL (DURAGESIC) 12 MCG/HR Place 1 patch (12.5 mcg total) onto the skin every 3 (three) days. 10 patch 0  . fluconazole (DIFLUCAN) 40 MG/ML suspension Take 2.5 mLs (100  mg total) by mouth daily. Total of 14 days 35 mL 0  . insulin aspart (NOVOLOG) 100 UNIT/ML FlexPen Use in accordance to provided sliding scale 15 mL 11  . levofloxacin (LEVAQUIN) 750 MG tablet Take 1 tablet (750 mg total) by mouth daily. 2 tablet 0  . lidocaine-prilocaine (EMLA) cream Apply 1 application topically as needed. 30 g  0  . ondansetron (ZOFRAN-ODT) 8 MG disintegrating tablet Take 1 tablet (8 mg total) by mouth every 8 (eight) hours as needed for nausea or vomiting. 45 tablet 1  . pantoprazole (PROTONIX) 40 MG tablet Take 1 tablet (40 mg total) by mouth daily. 30 tablet 0  . polyethylene glycol (MIRALAX / GLYCOLAX) packet Take 17 g by mouth daily as needed for mild constipation. 14 each 0  . Potassium Bicarb-Citric Acid (EFFER-K) 20 MEQ TBEF Take 2 tablets (40 mEq total) by mouth 2 (two) times daily. 120 each 0  . Potassium Bicarb-Citric Acid 20 MEQ TBEF Take 1 tablet (20 mEq total) by mouth 2 (two) times daily. 60 each 3  . promethazine (PHENERGAN) 25 MG suppository Place 25 mg rectally every 8 (eight) hours as needed for nausea or vomiting.   0  . promethazine (PHENERGAN) 25 MG tablet Take 1 tablet (25 mg total) by mouth every 6 (six) hours as needed for nausea or vomiting. 60 tablet 2  . traMADol (ULTRAM) 50 MG tablet Take 50 mg by mouth every 12 (twelve) hours as needed for moderate pain.      No current facility-administered medications for this visit.    Review of Systems  Constitutional: Positive for malaise/fatigue. Negative for weight loss.       In wheelchair. HENT: Negative for congestion, ear discharge, ear pain, hearing loss, nosebleeds, sore throat and tinnitus.        Mouth ulcer Eyes: Negative.   Respiratory: Positive for shortness of breath. Negative for stridor.   Cardiovascular: Negative.   Gastrointestinal: Positive for nausea and diarrhea.       Nausea from the excess saliva production.  Genitourinary: Negative.   Musculoskeletal: Positive for back pain.    Skin: Negative.   Neurological: Positive for weakness. Negative for dizziness, tingling, tremors, sensory change, speech change, focal weakness, seizures, loss of consciousness and headaches.  Endo/Heme/Allergies: Negative.   Psychiatric/Behavioral: Negative for suicidal ideas, hallucinations, memory loss and substance abuse. The patient is not nervous/anxious.    14 point ROS was done and is otherwise as detailed above or in HPI  PHYSICAL EXAMINATION:  ECOG PERFORMANCE STATUS: 3 - Symptomatic, >50% confined to bed  Filed Vitals:   02/06/15 0900  BP: 99/66  Pulse: 119  Temp: 97.9 F (36.6 C)  Resp: 22   Filed Weights   02/06/15 0900  Weight: 168 lb 14.4 oz (76.613 kg)    Physical Exam  Constitutional: She is oriented to person, place, and time and well-developed, well-nourished, and in no distress.  In wheelchair HENT:  Head: Normocephalic and atraumatic.  Nose: Nose normal.  Mouth/Throat: Oropharynx is clear and moist. No oropharyngeal exudate.  Eyes: Conjunctivae and EOM are normal. Pupils are equal, round, and reactive to light. Right eye exhibits no discharge. Left eye exhibits no discharge.   Neck: Normal range of motion. Neck supple. No tracheal deviation present. No thyromegaly present.  Cardiovascular: Normal rate, regular rhythm and normal heart sounds.  Exam reveals no gallop and no friction rub.   No murmur heard. Pulmonary/Chest: Effort normal and breath sounds normal. She has no wheezes. She has no rales.  Abdominal: Soft. Bowel sounds are normal. She exhibits no distension and no mass. There is no rebound and no guarding.  Palpable liver edge about 6 inches below the costal margin.  Musculoskeletal: Normal range of motion. She exhibits no edema.  Lymphadenopathy:    She has no cervical adenopathy.  Neurological: She is alert and oriented to  person, place, and time. She has normal reflexes. No cranial nerve deficit. Gait normal. Coordination normal.  Skin:  Skin is warm and dry. No rash noted.  Psychiatric: Mood, memory, affect and judgment normal.  Nursing note and vitals reviewed.    LABORATORY DATA:  I have reviewed the data as listed  Results for PEARLEAN, SABINA (MRN [id number])   Ref. Range 02/06/2015 10:31  Sodium Latest Ref Range: 135-145 mmol/L 128 (L)  Potassium Latest Ref Range: 3.5-5.1 mmol/L 3.5  Chloride Latest Ref Range: 101-111 mmol/L 86 (L)  CO2 Latest Ref Range: 22-32 mmol/L 31  BUN Latest Ref Range: 6-20 mg/dL 15  Creatinine Latest Ref Range: 0.44-1.00 mg/dL 0.65  Calcium Latest Ref Range: 8.9-10.3 mg/dL 8.0 (L)  EGFR (Non-African Amer.) Latest Ref Range: >60 mL/min >60  EGFR (African American) Latest Ref Range: >60 mL/min >60  Glucose Latest Ref Range: 65-99 mg/dL 294 (H)  Anion gap Latest Ref Range: 5-15  11  Alkaline Phosphatase Latest Ref Range: 38-126 U/L 257 (H)  Albumin Latest Ref Range: 3.5-5.0 g/dL 2.3 (L)  AST Latest Ref Range: 15-41 U/L 69 (H)  ALT Latest Ref Range: 14-54 U/L 104 (H)  Total Protein Latest Ref Range: 6.5-8.1 g/dL 6.0 (L)  Total Bilirubin Latest Ref Range: 0.3-1.2 mg/dL 1.6 (H)  WBC Latest Ref Range: 4.0-10.5 K/uL 3.8 (L)  RBC Latest Ref Range: 3.87-5.11 MIL/uL 4.84  Hemoglobin Latest Ref Range: 12.0-15.0 g/dL 13.2  HCT Latest Ref Range: 36.0-46.0 % 38.1  MCV Latest Ref Range: 78.0-100.0 fL 78.7  MCH Latest Ref Range: 26.0-34.0 pg 27.3  MCHC Latest Ref Range: 30.0-36.0 g/dL 34.6  RDW Latest Ref Range: 11.5-15.5 % 16.1 (H)  Platelets Latest Ref Range: 150-400 K/uL 233  Neutrophils Latest Ref Range: 43-77 % 64  Lymphocytes Latest Ref Range: 12-46 % 26  Monocytes Relative Latest Ref Range: 3-12 % 10  Eosinophil Latest Ref Range: 0-5 % 0  Basophil Latest Ref Range: 0-1 % 0  NEUT# Latest Ref Range: 1.7-7.7 K/uL 2.4  Lymphocyte # Latest Ref Range: 0.7-4.0 K/uL 1.0  Monocyte # Latest Ref Range: 0.1-1.0 K/uL 0.4  Eosinophils Absolute Latest Ref Range: 0.0-0.7 K/uL 0.0  Basophils Absolute  Latest Ref Range: 0.0-0.1 K/uL 0.0      RADIOGRAPHIC STUDIES: I have personally reviewed the radiological images as listed and agreed with the findings in the report.  12/03/2014 CLINICAL DATA: Left breast cancer diagnosed 2014, status post bilateral mastectomy, XRT complete 01/2013, oral chemotherapy in progress  EXAM: CT CHEST, ABDOMEN, AND PELVIS WITH CONTRAST  IMPRESSION: Status post mastectomy with bilateral axillary lymph node dissection.  Small mediastinal lymph nodes, non FDG avid on recent PET, indeterminate.  Multifocal hepatic lesions, highly suspicious for hepatic metastases, as above.  Innumerable hypoenhancing lesions in the cyst spleen, possibly reflecting infiltrating disease such as sarcoidosis or amyloid. Metastatic disease is considered less likely.  Progression of upper abdominal/retroperitoneal lymphadenopathy, suspicious for nodal metastases.  Mixed lytic/sclerotic metastasis in the right sternum. Additional known osseous metastases in the bilateral pelvis are not apparent on CT.   Electronically Signed  By: Julian Hy M.D.  On: 12/03/2014 09:47   ASSESSMENT & PLAN:  Stage IV ER+ Carcinoma of Breast Liver Metastases with liver dysfunction Poor Insight into prognosis Poor PS Oral Thrush secondary to steroids Poor nutritional status Hyperglycemia   Unfortunately although she states she and her husband are talking about end-of-life issues, she would not addresses directly with me today. Her performance status is quite poor.  I do  not believe she can tolerate halaven weekly even if I dose reduce. We discussed trying to administer it every other week and may need to consider growth factor support.  Ritta Slot is improved, she is on one dexamethasone tablet daily and we will continue this for now. She will complete her course of Diflucan.  I have given her a prescription for a walker to use at home she is very unsteady on her  feet. We discussed a home health referral with physical therapy and she is not interested in this currently.  Her blood sugars are improving but still remain elevated and for now I have encouraged her to continue with the sliding scale insulin.  She will return in one week for ongoing evaluation and discussion of end of life issues.   All questions were answered. The patient knows to call the clinic with any problems, questions or concerns. This note was electronically signed.    This document serves as a record of services personally performed by Ancil Linsey, MD. It was created on her behalf by Arlyce Harman, a trained medical scribe. The creation of this record is based on the scribe's personal observations and the provider's statements to them. This document has been checked and approved by the attending provider.  I have reviewed the above documentation for accuracy and completeness, and I agree with the above.  Molli Hazard, MD

## 2015-02-06 NOTE — Patient Instructions (Signed)
[place] at [facility] Discharge Instructions  RECOMMENDATIONS MADE BY THE CONSULTANT AND ANY TEST RESULTS WILL BE SENT TO YOUR REFERRING PHYSICIAN. Follow up in one week. Get some Potassium in your system.  Thank you for choosing [place] at [facility] to provide your oncology and hematology care.  To afford each patient quality time with our provider, please arrive at least 15 minutes before your scheduled appointment time.    You need to re-schedule your appointment should you arrive 10 or more minutes late.  We strive to give you quality time with our providers, and arriving late affects you and other patients whose appointments are after yours.  Also, if you no show three or more times for appointments you may be dismissed from the clinic at the providers discretion.     Again, thank you for choosing [facility].  Our hope is that these requests will decrease the amount of time that you wait before being seen by our physicians.       _____________________________________________________________  Should you have questions after your visit to [facility], please contact our office at [phone] between the hours of 8:30 a.m. and 4:30 p.m.  Voicemails left after 4:30 p.m. will not be returned until the following business day.  For prescription refill requests, have your pharmacy contact our office.

## 2015-02-07 ENCOUNTER — Other Ambulatory Visit (HOSPITAL_COMMUNITY): Payer: Self-pay | Admitting: Oncology

## 2015-02-08 ENCOUNTER — Other Ambulatory Visit (HOSPITAL_COMMUNITY): Payer: Self-pay | Admitting: Oncology

## 2015-02-08 ENCOUNTER — Encounter (HOSPITAL_BASED_OUTPATIENT_CLINIC_OR_DEPARTMENT_OTHER): Payer: BLUE CROSS/BLUE SHIELD

## 2015-02-08 VITALS — BP 104/68 | HR 90 | Temp 98.1°F | Resp 18

## 2015-02-08 DIAGNOSIS — Z5111 Encounter for antineoplastic chemotherapy: Secondary | ICD-10-CM

## 2015-02-08 DIAGNOSIS — C50912 Malignant neoplasm of unspecified site of left female breast: Secondary | ICD-10-CM | POA: Diagnosis not present

## 2015-02-08 DIAGNOSIS — C7951 Secondary malignant neoplasm of bone: Secondary | ICD-10-CM | POA: Diagnosis not present

## 2015-02-08 DIAGNOSIS — C787 Secondary malignant neoplasm of liver and intrahepatic bile duct: Secondary | ICD-10-CM

## 2015-02-08 MED ORDER — ERIBULIN MESYLATE CHEMO INJECTION 1 MG/2ML
1.1000 mg/m2 | Freq: Once | INTRAVENOUS | Status: AC
  Administered 2015-02-08: 2.1 mg via INTRAVENOUS
  Filled 2015-02-08: qty 4.2

## 2015-02-08 MED ORDER — HEPARIN SOD (PORK) LOCK FLUSH 100 UNIT/ML IV SOLN
INTRAVENOUS | Status: AC
  Filled 2015-02-08: qty 5

## 2015-02-08 MED ORDER — SODIUM CHLORIDE 0.9 % IV SOLN
Freq: Once | INTRAVENOUS | Status: AC
  Administered 2015-02-08: 11:00:00 via INTRAVENOUS

## 2015-02-08 MED ORDER — SODIUM CHLORIDE 0.9 % IV SOLN
Freq: Once | INTRAVENOUS | Status: AC
  Administered 2015-02-08: 11:00:00 via INTRAVENOUS
  Filled 2015-02-08: qty 4

## 2015-02-08 MED ORDER — PEGFILGRASTIM 6 MG/0.6ML ~~LOC~~ PSKT
6.0000 mg | PREFILLED_SYRINGE | Freq: Once | SUBCUTANEOUS | Status: AC
  Administered 2015-02-08: 6 mg via SUBCUTANEOUS
  Filled 2015-02-08: qty 0.6

## 2015-02-08 MED ORDER — SODIUM CHLORIDE 0.9 % IJ SOLN: 10.0000 mL | INTRAMUSCULAR | Status: DC | PRN

## 2015-02-08 MED ORDER — HEPARIN SOD (PORK) LOCK FLUSH 100 UNIT/ML IV SOLN
500.0000 [IU] | Freq: Once | INTRAVENOUS | Status: AC | PRN
  Administered 2015-02-08: 500 [IU]

## 2015-02-08 NOTE — Progress Notes (Signed)
Patient tolerated infusion well and understands the instructions with her Neulasta injector.

## 2015-02-10 ENCOUNTER — Other Ambulatory Visit: Payer: Self-pay

## 2015-02-10 ENCOUNTER — Encounter (HOSPITAL_COMMUNITY): Payer: Self-pay

## 2015-02-10 ENCOUNTER — Emergency Department (HOSPITAL_COMMUNITY): Payer: BLUE CROSS/BLUE SHIELD

## 2015-02-10 ENCOUNTER — Inpatient Hospital Stay (HOSPITAL_COMMUNITY)
Admission: EM | Admit: 2015-02-10 | Discharge: 2015-02-16 | DRG: 871 | Disposition: A | Discharge status: Home Health | Payer: BLUE CROSS/BLUE SHIELD | Attending: Internal Medicine | Admitting: Internal Medicine

## 2015-02-10 DIAGNOSIS — E119 Type 2 diabetes mellitus without complications: Secondary | ICD-10-CM | POA: Diagnosis not present

## 2015-02-10 DIAGNOSIS — R0600 Dyspnea, unspecified: Secondary | ICD-10-CM | POA: Diagnosis not present

## 2015-02-10 DIAGNOSIS — E860 Dehydration: Secondary | ICD-10-CM | POA: Diagnosis present

## 2015-02-10 DIAGNOSIS — E861 Hypovolemia: Secondary | ICD-10-CM | POA: Diagnosis present

## 2015-02-10 DIAGNOSIS — C787 Secondary malignant neoplasm of liver and intrahepatic bile duct: Secondary | ICD-10-CM | POA: Diagnosis present

## 2015-02-10 DIAGNOSIS — Z79891 Long term (current) use of opiate analgesic: Secondary | ICD-10-CM

## 2015-02-10 DIAGNOSIS — Z79899 Other long term (current) drug therapy: Secondary | ICD-10-CM

## 2015-02-10 DIAGNOSIS — C50912 Malignant neoplasm of unspecified site of left female breast: Secondary | ICD-10-CM | POA: Diagnosis present

## 2015-02-10 DIAGNOSIS — E876 Hypokalemia: Secondary | ICD-10-CM | POA: Diagnosis not present

## 2015-02-10 DIAGNOSIS — Z794 Long term (current) use of insulin: Secondary | ICD-10-CM

## 2015-02-10 DIAGNOSIS — Z803 Family history of malignant neoplasm of breast: Secondary | ICD-10-CM | POA: Diagnosis not present

## 2015-02-10 DIAGNOSIS — Z8049 Family history of malignant neoplasm of other genital organs: Secondary | ICD-10-CM

## 2015-02-10 DIAGNOSIS — E785 Hyperlipidemia, unspecified: Secondary | ICD-10-CM | POA: Diagnosis present

## 2015-02-10 DIAGNOSIS — R000 Tachycardia, unspecified: Secondary | ICD-10-CM

## 2015-02-10 DIAGNOSIS — R079 Chest pain, unspecified: Secondary | ICD-10-CM

## 2015-02-10 DIAGNOSIS — Z8249 Family history of ischemic heart disease and other diseases of the circulatory system: Secondary | ICD-10-CM | POA: Diagnosis not present

## 2015-02-10 DIAGNOSIS — E871 Hypo-osmolality and hyponatremia: Secondary | ICD-10-CM | POA: Diagnosis present

## 2015-02-10 DIAGNOSIS — E440 Moderate protein-calorie malnutrition: Secondary | ICD-10-CM | POA: Diagnosis present

## 2015-02-10 DIAGNOSIS — A419 Sepsis, unspecified organism: Secondary | ICD-10-CM | POA: Diagnosis not present

## 2015-02-10 DIAGNOSIS — R55 Syncope and collapse: Secondary | ICD-10-CM | POA: Diagnosis present

## 2015-02-10 DIAGNOSIS — Z6827 Body mass index (BMI) 27.0-27.9, adult: Secondary | ICD-10-CM

## 2015-02-10 DIAGNOSIS — Z9221 Personal history of antineoplastic chemotherapy: Secondary | ICD-10-CM | POA: Diagnosis not present

## 2015-02-10 DIAGNOSIS — Z515 Encounter for palliative care: Secondary | ICD-10-CM | POA: Insufficient documentation

## 2015-02-10 DIAGNOSIS — J90 Pleural effusion, not elsewhere classified: Secondary | ICD-10-CM

## 2015-02-10 DIAGNOSIS — Z923 Personal history of irradiation: Secondary | ICD-10-CM | POA: Diagnosis not present

## 2015-02-10 DIAGNOSIS — Z833 Family history of diabetes mellitus: Secondary | ICD-10-CM

## 2015-02-10 DIAGNOSIS — C7951 Secondary malignant neoplasm of bone: Secondary | ICD-10-CM | POA: Diagnosis present

## 2015-02-10 DIAGNOSIS — Z9013 Acquired absence of bilateral breasts and nipples: Secondary | ICD-10-CM | POA: Diagnosis present

## 2015-02-10 DIAGNOSIS — C799 Secondary malignant neoplasm of unspecified site: Secondary | ICD-10-CM | POA: Diagnosis not present

## 2015-02-10 DIAGNOSIS — E43 Unspecified severe protein-calorie malnutrition: Secondary | ICD-10-CM | POA: Diagnosis present

## 2015-02-10 DIAGNOSIS — Y95 Nosocomial condition: Secondary | ICD-10-CM | POA: Diagnosis present

## 2015-02-10 DIAGNOSIS — J189 Pneumonia, unspecified organism: Secondary | ICD-10-CM | POA: Diagnosis present

## 2015-02-10 LAB — COMPREHENSIVE METABOLIC PANEL
ALBUMIN: 2.4 g/dL — AB (ref 3.5–5.0)
ALK PHOS: 253 U/L — AB (ref 38–126)
ALT: 98 U/L — ABNORMAL HIGH (ref 14–54)
AST: 75 U/L — AB (ref 15–41)
Anion gap: 13 (ref 5–15)
BUN: 13 mg/dL (ref 6–20)
CO2: 26 mmol/L (ref 22–32)
Calcium: 7.4 mg/dL — ABNORMAL LOW (ref 8.9–10.3)
Chloride: 90 mmol/L — ABNORMAL LOW (ref 101–111)
Creatinine, Ser: 0.81 mg/dL (ref 0.44–1.00)
GFR calc Af Amer: >60 mL/min (ref >60)
Glucose, Bld: 206 mg/dL — ABNORMAL HIGH (ref 65–99)
Potassium: 2.9 mmol/L — ABNORMAL LOW (ref 3.5–5.1)
Sodium: 129 mmol/L — ABNORMAL LOW (ref 135–145)
Total Bilirubin: 1.8 mg/dL — ABNORMAL HIGH (ref 0.3–1.2)
Total Protein: 5.6 g/dL — ABNORMAL LOW (ref 6.5–8.1)

## 2015-02-10 LAB — URINALYSIS, ROUTINE W REFLEX MICROSCOPIC
Bilirubin Urine: NEGATIVE
GLUCOSE, UA: NEGATIVE mg/dL
KETONES UR: NEGATIVE mg/dL
NITRITE: NEGATIVE
PROTEIN: NEGATIVE mg/dL
Specific Gravity, Urine: <1.005 — ABNORMAL LOW (ref 1.005–1.030)
Urobilinogen, UA: 0.2 mg/dL (ref 0.0–1.0)
pH: 6 (ref 5.0–8.0)

## 2015-02-10 LAB — CBC WITH DIFFERENTIAL/PLATELET
BASOS ABS: 0 10*3/uL (ref 0.0–0.1)
Basophils Relative: 0 % (ref 0–1)
Eosinophils Absolute: 0 10*3/uL (ref 0.0–0.7)
Eosinophils Relative: 0 % (ref 0–5)
HCT: 40 % (ref 36.0–46.0)
Hemoglobin: 13.8 g/dL (ref 12.0–15.0)
LYMPHS PCT: 5 % — AB (ref 12–46)
Lymphs Abs: 1 10*3/uL (ref 0.7–4.0)
MCH: 27.4 pg (ref 26.0–34.0)
MCHC: 34.5 g/dL (ref 30.0–36.0)
MCV: 79.5 fL (ref 78.0–100.0)
MONOS PCT: 0 % — AB (ref 3–12)
Monocytes Absolute: 0 10*3/uL — ABNORMAL LOW (ref 0.1–1.0)
NEUTROS ABS: 19.4 10*3/uL — AB (ref 1.7–7.7)
Neutrophils Relative %: 95 % — ABNORMAL HIGH (ref 43–77)
Platelets: 202 10*3/uL (ref 150–400)
RBC: 5.03 MIL/uL (ref 3.87–5.11)
RDW: 16.4 % — AB (ref 11.5–15.5)
WBC Morphology: INCREASED
WBC: 20.4 10*3/uL — ABNORMAL HIGH (ref 4.0–10.5)

## 2015-02-10 LAB — URINE MICROSCOPIC-ADD ON

## 2015-02-10 LAB — CBC
HCT: 34.5 % — ABNORMAL LOW (ref 36.0–46.0)
Hemoglobin: 11.8 g/dL — ABNORMAL LOW (ref 12.0–15.0)
MCH: 27.1 pg (ref 26.0–34.0)
MCHC: 34.2 g/dL (ref 30.0–36.0)
MCV: 79.3 fL (ref 78.0–100.0)
Platelets: 185 10*3/uL (ref 150–400)
RBC: 4.35 MIL/uL (ref 3.87–5.11)
RDW: 16.2 % — ABNORMAL HIGH (ref 11.5–15.5)
WBC: 17.4 10*3/uL — ABNORMAL HIGH (ref 4.0–10.5)

## 2015-02-10 LAB — GLUCOSE, CAPILLARY: GLUCOSE-CAPILLARY: 301 mg/dL — AB (ref 65–99)

## 2015-02-10 LAB — LACTIC ACID, PLASMA
LACTIC ACID, VENOUS: 1.6 mmol/L (ref 0.5–2.0)
Lactic Acid, Venous: 2.7 mmol/L (ref 0.5–2.0)

## 2015-02-10 LAB — CREATININE, SERUM: Creatinine, Ser: 0.63 mg/dL (ref 0.44–1.00)

## 2015-02-10 LAB — LIPASE, BLOOD: Lipase: 38 U/L (ref 22–51)

## 2015-02-10 LAB — MRSA PCR SCREENING: MRSA BY PCR: NEGATIVE

## 2015-02-10 LAB — TSH: TSH: 1.584 u[IU]/mL (ref 0.350–4.500)

## 2015-02-10 MED ORDER — VANCOMYCIN HCL IN DEXTROSE 750-5 MG/150ML-% IV SOLN
750.0000 mg | Freq: Three times a day (TID) | INTRAVENOUS | Status: DC
  Administered 2015-02-10 – 2015-02-13 (×10): 750 mg via INTRAVENOUS
  Filled 2015-02-10 (×11): qty 150

## 2015-02-10 MED ORDER — PIPERACILLIN-TAZOBACTAM 3.375 G IVPB
INTRAVENOUS | Status: AC
  Filled 2015-02-10: qty 100

## 2015-02-10 MED ORDER — SODIUM CHLORIDE 0.9 % IJ SOLN
3.0000 mL | Freq: Two times a day (BID) | INTRAMUSCULAR | Status: DC
  Administered 2015-02-10 – 2015-02-16 (×9): 3 mL via INTRAVENOUS

## 2015-02-10 MED ORDER — ONDANSETRON HCL 4 MG/2ML IJ SOLN
4.0000 mg | Freq: Once | INTRAMUSCULAR | Status: AC
  Administered 2015-02-10: 4 mg via INTRAVENOUS
  Filled 2015-02-10: qty 2

## 2015-02-10 MED ORDER — CALCIUM CARBONATE-VITAMIN D 500-200 MG-UNIT PO TABS
2.0000 | ORAL_TABLET | Freq: Two times a day (BID) | ORAL | Status: DC
  Administered 2015-02-10 – 2015-02-16 (×12): 2 via ORAL
  Filled 2015-02-10 (×19): qty 2

## 2015-02-10 MED ORDER — PIPERACILLIN-TAZOBACTAM 3.375 G IVPB
3.3750 g | Freq: Three times a day (TID) | INTRAVENOUS | Status: DC
Start: 2015-02-10 — End: 2015-02-14
  Administered 2015-02-10 – 2015-02-14 (×11): 3.375 g via INTRAVENOUS
  Filled 2015-02-10 (×15): qty 50

## 2015-02-10 MED ORDER — CHLORHEXIDINE GLUCONATE 0.12 % MT SOLN
15.0000 mL | Freq: Two times a day (BID) | OROMUCOSAL | Status: DC
  Administered 2015-02-11 – 2015-02-13 (×5): 15 mL via OROMUCOSAL
  Filled 2015-02-10 (×5): qty 15

## 2015-02-10 MED ORDER — FLUCONAZOLE 40 MG/ML PO SUSR
100.0000 mg | Freq: Every day | ORAL | Status: DC
  Administered 2015-02-11: 100 mg via ORAL
  Filled 2015-02-10 (×4): qty 2.5

## 2015-02-10 MED ORDER — SODIUM CHLORIDE 0.9 % IV BOLUS (SEPSIS)
1000.0000 mL | Freq: Once | INTRAVENOUS | Status: AC
  Administered 2015-02-10: 1000 mL via INTRAVENOUS

## 2015-02-10 MED ORDER — KCL IN DEXTROSE-NACL 40-5-0.9 MEQ/L-%-% IV SOLN
INTRAVENOUS | Status: DC
  Administered 2015-02-10: 18:00:00 via INTRAVENOUS
  Filled 2015-02-10 (×3): qty 1000

## 2015-02-10 MED ORDER — ONDANSETRON HCL 4 MG/2ML IJ SOLN
4.0000 mg | Freq: Four times a day (QID) | INTRAMUSCULAR | Status: DC | PRN
  Administered 2015-02-12 – 2015-02-15 (×3): 4 mg via INTRAVENOUS
  Filled 2015-02-10 (×3): qty 2

## 2015-02-10 MED ORDER — VANCOMYCIN HCL IN DEXTROSE 1-5 GM/200ML-% IV SOLN
1000.0000 mg | Freq: Once | INTRAVENOUS | Status: AC
  Administered 2015-02-10: 1000 mg via INTRAVENOUS
  Filled 2015-02-10: qty 200

## 2015-02-10 MED ORDER — SODIUM CHLORIDE 0.9 % IV SOLN
INTRAVENOUS | Status: DC
  Administered 2015-02-10: 14:00:00 via INTRAVENOUS

## 2015-02-10 MED ORDER — ENOXAPARIN SODIUM 40 MG/0.4ML ~~LOC~~ SOLN
40.0000 mg | SUBCUTANEOUS | Status: DC
  Administered 2015-02-10 – 2015-02-11 (×2): 40 mg via SUBCUTANEOUS
  Filled 2015-02-10 (×2): qty 0.4

## 2015-02-10 MED ORDER — INSULIN ASPART 100 UNIT/ML ~~LOC~~ SOLN
0.0000 [IU] | SUBCUTANEOUS | Status: DC
  Administered 2015-02-10: 7 [IU] via SUBCUTANEOUS

## 2015-02-10 MED ORDER — ONDANSETRON HCL 4 MG PO TABS: 4.0000 mg | ORAL_TABLET | Freq: Four times a day (QID) | ORAL | Status: DC | PRN

## 2015-02-10 MED ORDER — KCL IN DEXTROSE-NACL 40-5-0.9 MEQ/L-%-% IV SOLN
INTRAVENOUS | Status: AC
  Filled 2015-02-10: qty 1000

## 2015-02-10 MED ORDER — CETYLPYRIDINIUM CHLORIDE 0.05 % MT LIQD
7.0000 mL | Freq: Two times a day (BID) | OROMUCOSAL | Status: DC
  Administered 2015-02-11 – 2015-02-13 (×3): 7 mL via OROMUCOSAL

## 2015-02-10 MED ORDER — PANTOPRAZOLE SODIUM 40 MG PO TBEC
40.0000 mg | DELAYED_RELEASE_TABLET | Freq: Every day | ORAL | Status: DC
  Administered 2015-02-10 – 2015-02-16 (×7): 40 mg via ORAL
  Filled 2015-02-10 (×7): qty 1

## 2015-02-10 MED ORDER — POTASSIUM CHLORIDE CRYS ER 20 MEQ PO TBCR
40.0000 meq | EXTENDED_RELEASE_TABLET | Freq: Once | ORAL | Status: AC
  Administered 2015-02-10: 40 meq via ORAL
  Filled 2015-02-10: qty 2

## 2015-02-10 MED ORDER — HYDROCORTISONE NA SUCCINATE PF 100 MG IJ SOLR: 50.0000 mg | Freq: Three times a day (TID) | INTRAMUSCULAR | Status: DC

## 2015-02-10 MED ORDER — FENTANYL 12 MCG/HR TD PT72: 12.5000 ug | MEDICATED_PATCH | TRANSDERMAL | Status: DC

## 2015-02-10 MED ORDER — PIPERACILLIN-TAZOBACTAM 3.375 G IVPB 30 MIN: 3.3750 g | Freq: Three times a day (TID) | INTRAVENOUS | Status: DC

## 2015-02-10 MED ORDER — PIPERACILLIN-TAZOBACTAM 3.375 G IVPB 30 MIN
3.3750 g | Freq: Three times a day (TID) | INTRAVENOUS | Status: DC
  Administered 2015-02-10: 3.375 g via INTRAVENOUS
  Filled 2015-02-10 (×4): qty 50

## 2015-02-10 MED ORDER — INSULIN ASPART 100 UNIT/ML ~~LOC~~ SOLN
0.0000 [IU] | Freq: Three times a day (TID) | SUBCUTANEOUS | Status: DC
  Administered 2015-02-11: 5 [IU] via SUBCUTANEOUS

## 2015-02-10 MED ORDER — FENTANYL 12 MCG/HR TD PT72
12.5000 ug | MEDICATED_PATCH | TRANSDERMAL | Status: DC
  Administered 2015-02-10 – 2015-02-13 (×2): 12.5 ug via TRANSDERMAL
  Filled 2015-02-10 (×2): qty 1

## 2015-02-10 MED ORDER — VANCOMYCIN HCL IN DEXTROSE 750-5 MG/150ML-% IV SOLN
INTRAVENOUS | Status: AC
Start: 2015-02-10 — End: 2015-02-10
  Filled 2015-02-10: qty 150

## 2015-02-10 MED ORDER — HYDROCORTISONE NA SUCCINATE PF 100 MG IJ SOLR
50.0000 mg | Freq: Three times a day (TID) | INTRAMUSCULAR | Status: AC
  Administered 2015-02-10 – 2015-02-11 (×3): 50 mg via INTRAVENOUS
  Filled 2015-02-10 (×3): qty 2

## 2015-02-10 MED ORDER — ONDANSETRON HCL 4 MG/2ML IJ SOLN: 4.0000 mg | Freq: Once | INTRAMUSCULAR | Status: DC

## 2015-02-10 MED ORDER — POLYETHYLENE GLYCOL 3350 17 G PO PACK: 17.0000 g | PACK | Freq: Every day | ORAL | Status: DC | PRN

## 2015-02-10 NOTE — H&P (Signed)
[facility] History and Physical  MICHAELAH CREDEUR WUJ:[id number] DOB: 07-Sep-1970    PCP:   Molli Hazard, MD   Chief Complaint:  Syncope.  HPI: Brenda Avery is an 44 y.o. female with hx of metatastatic breast ca to multiple sites including liver mets, hx of DM, HLD, currently undergoing chemotherapy, s/p bilateral mastectomy, s/p portacath placement, has decreased appetite, nausea, vomiting, and presented to the ER with syncope.  She did not hit her head or suffered any significant trauma as her husband caught her.  Evaluation in the ER showed hypotension with tachycardia, CXR showed PNA with parapneumonic effusion, lactid acid of 2.5, WBC of 20K,  K of 2.9, and Na of 129.  Her LFTs were slightly elevated but not a significant change from values one week ago.  Her UA did not show evidence of infection.  She was cultured, and Van/Zosyn was started.  She also had oral candidiasis and has been treated with antifungal meds. EKG showed sinus tachycardia.  Hospitalist was asked to admit her for syncope, weakness, dehydration, PNA and possible sepsis.   Rewiew of Systems:  Constitutional: Negative for malaise, fever and chills.  Eyes: Negative for eye pain, redness and discharge, diplopia, visual changes, or flashes of light. ENMT: Negative for ear pain, hoarseness, nasal congestion, sinus pressure and sore throat. No headaches; tinnitus, drooling, or problem swallowing. Cardiovascular: Negative for chest pain, palpitations, diaphoresis, dyspnea and peripheral edema. ; No orthopnea, PND Respiratory: Negative for cough, hemoptysis, wheezing and stridor. No pleuritic chestpain. Gastrointestinal: Negative for  diarrhea, constipation, abdominal pain, melena, blood in stool, hematemesis, jaundice and rectal bleeding.    Genitourinary: Negative for frequency, dysuria, incontinence,flank pain and hematuria; Musculoskeletal: Negative for back pain and neck pain. Negative for swelling and trauma.;   Skin: . Negative for pruritus, rash, abrasions, bruising and skin lesion.; ulcerations Neuro: Negative for headache,  and neck stiffness. Negative for weakness, altered level of consciousness , altered mental status, extremity weakness, burning feet, involuntary movement, seizure and syncope.  Psych: negative for anxiety, depression, insomnia, tearfulness, panic attacks, hallucinations, paranoia, suicidal or homicidal ideation    Past Medical History  Diagnosis Date  . Hyperlipidemia     diet therapy per patient request  . 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 Hematuria 2000/2001    evaluted in [place] with negative results  . Breast CA     left  . S/P radiation therapy  05/10/2013-06/23/2013    Left chest wall / 50.4 Gray @ 1.8 Gray per fraction x 28 fractions/ Left Supraclavicular fossa / 49 Gray @1 .8 Gray per fraction x 25 fractions/ Left PAB / 45 Gy at 1.8 Gray per fraction x 25 fractions/Left scar / 10 Gray at [employer] per fraction x 5 fractions     . Status post chemotherapy      Radiation Therapy Radiosensitizing Xeloda  . Status post chemotherapy  09/26/2012 - 11/07/2012      Neoadjuvant chemotherapy consisting of 4 cycles of a.c. From 09/26/2012 through 11/07/2012 she then received 12 weeks of paclitaxel.  . Use of tamoxifen (Nolvadex) started 06/26/13  . Type II diabetes mellitus     diet therapy     Past Surgical History  Procedure Laterality Date  . Rectocele repair  2004  . Cystoscopy  2001  . Nevus excision  2004    facial  . Portacath placement  09/20/2012    Procedure: INSERTION PORT-A-CATH;  Surgeon: Adin Hector, MD;  Location: [facility] OR;  Service: General;  Laterality: N/A;  port a cath insertion with flouro and ultrasound   . Mastectomy Right 04/10/2013  . Mastectomy modified radical Left 04/10/2013  . Axillary sentinel node biopsy Right 04/10/2013  . Liver biopsy    . Breast biopsy Left 2014  . Tubal ligation  2004  . Mastectomy modified radical Left  04/10/2013    Procedure: LEFT MODIFIED RADICAL MASTECTOMY ;  Surgeon: Adin Hector, MD;  Location: [place];  Service: General;  Laterality: Left;  . Simple mastectomy with axillary sentinel node biopsy Right 04/10/2013    Procedure: RIGHT TOTAL  MASTECTOMY WITH AXILLARY SENTINEL NODE BIOPSY;  Surgeon: Adin Hector, MD;  Location: [place];  Service: General;  Laterality: Right;  Methylene Blue Injection    Medications:  HOME MEDS: Prior to Admission medications   Medication Sig Start Date End Date Taking? Authorizing Provider  calcium-vitamin D (OSCAL WITH D) 500-200 MG-UNIT per tablet Take 2 tablets by mouth 2 (two) times daily. 01/17/15  Yes Shanker Kristeen Mans, MD  dexamethasone (DECADRON) 4 MG tablet Take 1 tablet (4 mg total) by mouth 4 (four) times daily. Patient taking differently: Take 4 mg by mouth every other day.  01/21/15  Yes Maryanna Shape, NP  Diphenhyd-Hydrocort-Nystatin (FIRST-DUKES MOUTHWASH) SUSP Swish and swallow 43ml four times a day. 01/28/15  Yes Patrici Ranks, MD  fluconazole (DIFLUCAN) 40 MG/ML suspension Take 2.5 mLs (100 mg total) by mouth daily. Total of 14 days 02/01/15  Yes Patrici Ranks, MD  insulin aspart (NOVOLOG) 100 UNIT/ML FlexPen Use in accordance to provided sliding scale 02/01/15  Yes Patrici Ranks, MD  pantoprazole (PROTONIX) 40 MG tablet Take 1 tablet (40 mg total) by mouth daily. 01/17/15  Yes Shanker Kristeen Mans, MD  Potassium Bicarb-Citric Acid 20 MEQ TBEF Take 1 tablet (20 mEq total) by mouth 2 (two) times daily. 02/04/15  Yes Baird Cancer, PA-C  BAYER CONTOUR TEST test strip  02/01/15   Historical Provider, MD  Eribulin Mesylate (HALAVEN IV) Inject into the vein. Starting June 10: Days 1 & 8 every 21 days 01/25/15   Historical Provider, MD  fentaNYL (DURAGESIC) 12 MCG/HR Place 1 patch (12.5 mcg total) onto the skin every 3 (three) days. 02/04/15   Patrici Ranks, MD  lidocaine-prilocaine (EMLA) cream Apply 1 application topically as needed.  06/22/14   Laurie Panda, NP  ondansetron (ZOFRAN-ODT) 8 MG disintegrating tablet Take 1 tablet (8 mg total) by mouth every 8 (eight) hours as needed for nausea or vomiting. 01/17/15   Shanker Kristeen Mans, MD  oxyCODONE (OXY IR/ROXICODONE) 5 MG immediate release tablet Take 1-2 tabs (5-10mg ) every 6 hours as needed for breatkthrough pain. Patient not taking: Reported on 02/01/2015 01/25/15   Patrici Ranks, MD  oxyCODONE (OXY IR/ROXICODONE) 5 MG immediate release tablet Take 1 tablet (5 mg total) by mouth once. Patient not taking: Reported on 02/10/2015 01/25/15   Patrici Ranks, MD  polyethylene glycol (MIRALAX / GLYCOLAX) packet Take 17 g by mouth daily as needed for mild constipation. 01/17/15   Shanker Kristeen Mans, MD  promethazine (PHENERGAN) 25 MG suppository Place 25 mg rectally every 8 (eight) hours as needed for nausea or vomiting.  01/15/15   Historical Provider, MD  promethazine (PHENERGAN) 25 MG tablet Take 1 tablet (25 mg total) by mouth every 6 (six) hours as needed for nausea or vomiting. 01/22/15   Patrici Ranks, MD  traMADol (ULTRAM) 50 MG  tablet Take 50 mg by mouth every 12 (twelve) hours as needed for moderate pain.     Historical Provider, MD     Allergies:  Allergies  Allergen Reactions  . Metronidazole Hives and Itching  . Sulfonamide Derivatives Hives, Itching and Rash    Social History:   reports that she has never smoked. She has never used smokeless tobacco. She reports that she does not drink alcohol or use illicit drugs.  Family History: Family History  Problem Relation Age of Onset  . Hypertension Mother   . Diabetes Mother     MI  . Heart disease Mother   . Hypertension Sister     x4 only one HTN and one thats DI  . Hypertension Brother     x4 only 1 htn & dm  . Heart disease Father   . Breast cancer Maternal Aunt     diagnosed in her 84s  . Cancer Maternal Aunt     unknown  . Breast cancer Paternal Aunt     diagnosed in her 58s  . Cervical cancer  Sister 39  . Breast cancer Maternal Aunt     diagnosed in her 9s  . Breast cancer Cousin     3 maternal cousins - 2 in their 43s, 1 in her 14s     Physical Exam: Filed Vitals:   02/10/15 1615 02/10/15 1620 02/10/15 1630 02/10/15 1645  BP:   101/60   Pulse: 88  94 113  Temp:      TempSrc:      Resp: 21  17 18   Height:      Weight:  72.576 kg (160 lb)    SpO2: 99%  97% 96%   Blood pressure 101/60, pulse 113, temperature 97.7 F (36.5 C), temperature source Oral, resp. rate 18, height 5\' 4"  (1.626 m), weight 72.576 kg (160 lb), SpO2 96 %.  GEN:  Pleasant  patient lying in the stretcher in no acute distress; cooperative with exam. PSYCH:  alert and oriented x4; does not appear anxious or depressed; affect is appropriate. HEENT: Mucous membranes pink and anicteric; PERRLA; EOM intact; no cervical lymphadenopathy nor thyromegaly or carotid bruit; no JVD; There were no stridor. Neck is very supple. Breasts:: Not examined. S/p bilateral mastectomy. CHEST WALL: No tenderness. Port on right upper chest.  CHEST: Normal respiration, clear to auscultation bilaterally.  HEART: Regular rate and rhythm.  There are no murmur, rub, or gallops.   BACK: No kyphosis or scoliosis; no CVA tenderness ABDOMEN: soft and non-tender; no masses, no organomegaly, normal abdominal bowel sounds; no pannus; no intertriginous candida. There is no rebound and no distention. Rectal Exam: Not done EXTREMITIES: No bone or joint deformity; age-appropriate arthropathy of the hands and knees; no edema; no ulcerations.  There is no calf tenderness. Genitalia: not examined PULSES: 2+ and symmetric SKIN: Normal hydration no rash or ulceration CNS: Cranial nerves 2-12 grossly intact no focal lateralizing neurologic deficit.  Speech is fluent; uvula elevated with phonation, facial symmetry and tongue midline. DTR are normal bilaterally, cerebella exam is intact, barbinski is negative and strengths are equaled bilaterally.   No sensory loss.   Labs on Admission:  Basic Metabolic Panel:  Recent Labs Lab 02/06/15 1031 02/10/15 1358  NA 128* 129*  K 3.5 2.9*  CL 86* 90*  CO2 31 26  GLUCOSE 294* 206*  BUN 15 13  CREATININE 0.65 0.81  CALCIUM 8.0* 7.4*   Liver Function Tests:  Recent Labs Lab 02/06/15 1031  02/10/15 1358  AST 69* 75*  ALT 104* 98*  ALKPHOS 257* 253*  BILITOT 1.6* 1.8*  PROT 6.0* 5.6*  ALBUMIN 2.3* 2.4*    Recent Labs Lab 02/10/15 1358  LIPASE 38   No results for input(s): AMMONIA in the last 168 hours. CBC:  Recent Labs Lab 02/04/15 1234 02/06/15 1031 02/10/15 1358  WBC 1.7* 3.8* 20.4*  NEUTROABS 0.7* 2.4 19.4*  HGB 12.6 13.2 13.8  HCT 36.3 38.1 40.0  MCV 78.4 78.7 79.5  PLT 217 233 202   Cardiac Enzymes: No results for input(s): CKTOTAL, CKMB, CKMBINDEX, TROPONINI in the last 168 hours.  CBG: No results for input(s): GLUCAP in the last 168 hours.   Radiological Exams on Admission: Dg Chest Port 1 View  02/10/2015   CLINICAL DATA:  Syncopal episode today, history of breast cancer  EXAM: PORTABLE CHEST - 1 VIEW  COMPARISON:  09/20/2012  FINDINGS: Heart size unchanged and normal. Port-A-Cath on the right is stable. The left lung is clear.  There is opacity over the right lower lobe which appears to represent a combination of small to moderate pleural effusion and consolidation. This is new from prior studies.  IMPRESSION: New right effusion and lower lobe consolidation.   Electronically Signed   By: Skipper Cliche M.D.   On: 02/10/2015 14:27    EKG: Independently reviewed.    Assessment/Plan Present on Admission:  . Sepsis . HCAP (healthcare-associated pneumonia) . Dehydration . Hypokalemia . Malnutrition of moderate degree . Carcinoma of left breast metastatic to multiple sites . Syncope and collapse  PLAN:  Will admit her for syncope, volume depletion, hypokalemia, PNA, and possible sepsis.  She will get antibiotics and treatment for possible  sepsis, though her tachycardia, hypotension, could be from volume depletion, or adrenal insufficiency.  She has marked leukocytosis, but she has been on steroid.  Will admit her to the ICU, continue with IVF, and give stress dose of steroid.  For her DM, will use ISS.  For her HCAP, will continue with IV van/Zosyn.   For her hypokalemia, will continue supplement in IVF and IV riders.  Will check Mag.  She will be admitted to the ICU under [facility] service.  Thank you and good day.   Other plans as per orders.  Code Status: FULL Haskel Khan, MD. [facility] Pager [phone] 7pm to 7am.  02/10/2015, 5:15 PM

## 2015-02-10 NOTE — ED Provider Notes (Addendum)
CSN: [id number]     Arrival date & time 02/10/15  1309 History   First MD Initiated Contact with Patient 02/10/15 1315     Chief Complaint  Patient presents with  . Loss of Consciousness     (Consider location/radiation/quality/duration/timing/severity/associated sxs/prior Treatment) The history is provided by the patient, the spouse and the EMS personnel.   44 year old female followed by hematology oncology here. For metastatic breast cancer involving the liver. Patient undergoing chemotherapy. Last chemotherapy treatment was on Friday. Patient was at home getting a bath getting out of had a syncopal episode. She did not fall and hit her head. Her husband caught her. Patient was feeling lightheaded. Otherwise no specific complaints. Is feeling somewhat fatigued. No fevers. No nausea no vomiting. On arrival patient feeling fine no specific complaints. Concern would be that she may be dehydrated. Initial blood pressure when I first saw her was 99 systolic. She was tachycardic heart rate around 110.  Past Medical History  Diagnosis Date  . Hyperlipidemia     diet therapy per patient request  . 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 Hematuria 2000/2001    evaluted in [place] with negative results  . Breast CA     left  . S/P radiation therapy  05/10/2013-06/23/2013    Left chest wall / 50.4 Gray @ 1.8 Gray per fraction x 28 fractions/ Left Supraclavicular fossa / 9 Gray @1 .8 Gray per fraction x 25 fractions/ Left PAB / 45 Gy at 1.8 Gray per fraction x 25 fractions/Left scar / 10 Gray at [employer] per fraction x 5 fractions     . Status post chemotherapy      Radiation Therapy Radiosensitizing Xeloda  . Status post chemotherapy  09/26/2012 - 11/07/2012      Neoadjuvant chemotherapy consisting of 4 cycles of a.c. From 09/26/2012 through 11/07/2012 she then received 12 weeks of paclitaxel.  . Use of tamoxifen (Nolvadex) started 06/26/13  . Type II diabetes mellitus     diet therapy     Past Surgical History  Procedure Laterality Date  . Rectocele repair  2004  . Cystoscopy  2001  . Nevus excision  2004    facial  . Portacath placement  09/20/2012    Procedure: INSERTION PORT-A-CATH;  Surgeon: Adin Hector, MD;  Location: [place];  Service: General;  Laterality: N/A;  port a cath insertion with flouro and ultrasound   . Mastectomy Right 04/10/2013  . Mastectomy modified radical Left 04/10/2013  . Axillary sentinel node biopsy Right 04/10/2013  . Liver biopsy    . Breast biopsy Left 2014  . Tubal ligation  2004  . Mastectomy modified radical Left 04/10/2013    Procedure: LEFT MODIFIED RADICAL MASTECTOMY ;  Surgeon: Adin Hector, MD;  Location: [place];  Service: General;  Laterality: Left;  . Simple mastectomy with axillary sentinel node biopsy Right 04/10/2013    Procedure: RIGHT TOTAL  MASTECTOMY WITH AXILLARY SENTINEL NODE BIOPSY;  Surgeon: Adin Hector, MD;  Location: [place];  Service: General;  Laterality: Right;  Methylene Blue Injection   Family History  Problem Relation Age of Onset  . Hypertension Mother   . Diabetes Mother     MI  . Heart disease Mother   . Hypertension Sister     x4 only one HTN and one thats DI  . Hypertension Brother     x4 only 1 htn & dm  . Heart disease Father   .  Breast cancer Maternal Aunt     diagnosed in her 23s  . Cancer Maternal Aunt     unknown  . Breast cancer Paternal Aunt     diagnosed in her 67s  . Cervical cancer Sister 51  . Breast cancer Maternal Aunt     diagnosed in her 35s  . Breast cancer Cousin     3 maternal cousins - 2 in their 6s, 1 in her 64s   History  Substance Use Topics  . Smoking status: Never Smoker   . Smokeless tobacco: Never Used  . Alcohol Use: No   OB History    No data available     Review of Systems  Constitutional: Positive for appetite change and fatigue.  HENT: Negative for congestion.   Eyes: Negative for visual disturbance.  Respiratory: Negative for shortness of  breath.   Cardiovascular: Negative for chest pain.  Gastrointestinal: Positive for nausea and vomiting. Negative for abdominal pain.  Genitourinary: Negative for dysuria.  Musculoskeletal: Negative for neck pain.  Skin: Negative for rash.  Allergic/Immunologic: Positive for immunocompromised state.  Neurological: Positive for syncope and light-headedness.  Hematological: Does not bruise/bleed easily.  Psychiatric/Behavioral: Negative for confusion.      Allergies  Metronidazole and Sulfonamide derivatives  Home Medications   Prior to Admission medications   Medication Sig Start Date End Date Taking? Authorizing Provider  calcium-vitamin D (OSCAL WITH D) 500-200 MG-UNIT per tablet Take 2 tablets by mouth 2 (two) times daily. 01/17/15  Yes Shanker Kristeen Mans, MD  dexamethasone (DECADRON) 4 MG tablet Take 1 tablet (4 mg total) by mouth 4 (four) times daily. Patient taking differently: Take 4 mg by mouth every other day.  01/21/15  Yes Maryanna Shape, NP  Diphenhyd-Hydrocort-Nystatin (FIRST-DUKES MOUTHWASH) SUSP Swish and swallow 13ml four times a day. 01/28/15  Yes Patrici Ranks, MD  fluconazole (DIFLUCAN) 40 MG/ML suspension Take 2.5 mLs (100 mg total) by mouth daily. Total of 14 days 02/01/15  Yes Patrici Ranks, MD  insulin aspart (NOVOLOG) 100 UNIT/ML FlexPen Use in accordance to provided sliding scale 02/01/15  Yes Patrici Ranks, MD  pantoprazole (PROTONIX) 40 MG tablet Take 1 tablet (40 mg total) by mouth daily. 01/17/15  Yes Shanker Kristeen Mans, MD  Potassium Bicarb-Citric Acid 20 MEQ TBEF Take 1 tablet (20 mEq total) by mouth 2 (two) times daily. 02/04/15  Yes Baird Cancer, PA-C  BAYER CONTOUR TEST test strip  02/01/15   Historical Provider, MD  Eribulin Mesylate (HALAVEN IV) Inject into the vein. Starting June 10: Days 1 & 8 every 21 days 01/25/15   Historical Provider, MD  fentaNYL (DURAGESIC) 12 MCG/HR Place 1 patch (12.5 mcg total) onto the skin every 3 (three) days. 02/04/15    Patrici Ranks, MD  lidocaine-prilocaine (EMLA) cream Apply 1 application topically as needed. 06/22/14   Laurie Panda, NP  ondansetron (ZOFRAN-ODT) 8 MG disintegrating tablet Take 1 tablet (8 mg total) by mouth every 8 (eight) hours as needed for nausea or vomiting. 01/17/15   Shanker Kristeen Mans, MD  oxyCODONE (OXY IR/ROXICODONE) 5 MG immediate release tablet Take 1-2 tabs (5-10mg ) every 6 hours as needed for breatkthrough pain. Patient not taking: Reported on 02/01/2015 01/25/15   Patrici Ranks, MD  oxyCODONE (OXY IR/ROXICODONE) 5 MG immediate release tablet Take 1 tablet (5 mg total) by mouth once. Patient not taking: Reported on 02/10/2015 01/25/15   Patrici Ranks, MD  polyethylene glycol [facility] / Floria Raveling) packet Take  17 g by mouth daily as needed for mild constipation. 01/17/15   Shanker Kristeen Mans, MD  promethazine (PHENERGAN) 25 MG suppository Place 25 mg rectally every 8 (eight) hours as needed for nausea or vomiting.  01/15/15   Historical Provider, MD  promethazine (PHENERGAN) 25 MG tablet Take 1 tablet (25 mg total) by mouth every 6 (six) hours as needed for nausea or vomiting. 01/22/15   Patrici Ranks, MD  traMADol (ULTRAM) 50 MG tablet Take 50 mg by mouth every 12 (twelve) hours as needed for moderate pain.     Historical Provider, MD   BP 90/53 mmHg  Pulse 115  Temp(Src) 97.7 F (36.5 C) (Oral)  Resp 20  Ht 5\' 4"  (1.626 m)  Wt 160 lb (72.576 kg)  BMI 27.45 kg/m2  SpO2 96% Physical Exam  Constitutional: She is oriented to person, place, and time. She appears well-developed and well-nourished. No distress.  HENT:  Head: Normocephalic and atraumatic.  Mouth/Throat: Oropharynx is clear and moist.  Eyes: Conjunctivae and EOM are normal. Pupils are equal, round, and reactive to light.  Neck: Normal range of motion.  Cardiovascular: Regular rhythm and normal heart sounds.   No murmur heard. tachycardic.  Pulmonary/Chest: Effort normal and breath sounds normal. No  respiratory distress.  Abdominal: Soft. Bowel sounds are normal. There is no tenderness.  Musculoskeletal: Normal range of motion. She exhibits no edema.  Neurological: She is alert and oriented to person, place, and time. No cranial nerve deficit. She exhibits normal muscle tone. Coordination normal.  Skin: Skin is warm.  Nursing note and vitals reviewed.   ED Course  Procedures (including critical care time) Labs Review Labs Reviewed  CBC WITH DIFFERENTIAL/PLATELET - Abnormal; Notable for the following:    WBC 20.4 (*)    RDW 16.4 (*)    Neutrophils Relative % 95 (*)    Lymphocytes Relative 5 (*)    Monocytes Relative 0 (*)    Neutro Abs 19.4 (*)    Monocytes Absolute 0.0 (*)    All other components within normal limits  COMPREHENSIVE METABOLIC PANEL - Abnormal; Notable for the following:    Sodium 129 (*)    Potassium 2.9 (*)    Chloride 90 (*)    Glucose, Bld 206 (*)    Calcium 7.4 (*)    Total Protein 5.6 (*)    Albumin 2.4 (*)    AST 75 (*)    ALT 98 (*)    Alkaline Phosphatase 253 (*)    Total Bilirubin 1.8 (*)    All other components within normal limits  LACTIC ACID, PLASMA - Abnormal; Notable for the following:    Lactic Acid, Venous 2.7 (*)    All other components within normal limits  CULTURE, BLOOD (ROUTINE X 2)  CULTURE, BLOOD (ROUTINE X 2)  URINE CULTURE  LIPASE, BLOOD  URINALYSIS, ROUTINE W REFLEX MICROSCOPIC (NOT AT [facility])  LACTIC ACID, PLASMA  I-STAT CG4 LACTIC ACID, ED   Results for orders placed or performed during the hospital encounter of 02/10/15  CBC with Differential/Platelet  Result Value Ref Range   WBC 20.4 (H) 4.0 - 10.5 K/uL   RBC 5.03 3.87 - 5.11 MIL/uL   Hemoglobin 13.8 12.0 - 15.0 g/dL   HCT 40.0 36.0 - 46.0 %   MCV 79.5 78.0 - 100.0 fL   MCH 27.4 26.0 - 34.0 pg   MCHC 34.5 30.0 - 36.0 g/dL   RDW 16.4 (H) 11.5 - 15.5 %   Platelets  202 150 - 400 K/uL   Neutrophils Relative % 95 (H) 43 - 77 %   Lymphocytes Relative 5 (L) 12 - 46  %   Monocytes Relative 0 (L) 3 - 12 %   Eosinophils Relative 0 0 - 5 %   Basophils Relative 0 0 - 1 %   Neutro Abs 19.4 (H) 1.7 - 7.7 K/uL   Lymphs Abs 1.0 0.7 - 4.0 K/uL   Monocytes Absolute 0.0 (L) 0.1 - 1.0 K/uL   Eosinophils Absolute 0.0 0.0 - 0.7 K/uL   Basophils Absolute 0.0 0.0 - 0.1 K/uL   RBC Morphology POLYCHROMASIA PRESENT    WBC Morphology INCREASED BANDS (>20% BANDS)   Comprehensive metabolic panel  Result Value Ref Range   Sodium 129 (L) 135 - 145 mmol/L   Potassium 2.9 (L) 3.5 - 5.1 mmol/L   Chloride 90 (L) 101 - 111 mmol/L   CO2 26 22 - 32 mmol/L   Glucose, Bld 206 (H) 65 - 99 mg/dL   BUN 13 6 - 20 mg/dL   Creatinine, Ser 0.81 0.44 - 1.00 mg/dL   Calcium 7.4 (L) 8.9 - 10.3 mg/dL   Total Protein 5.6 (L) 6.5 - 8.1 g/dL   Albumin 2.4 (L) 3.5 - 5.0 g/dL   AST 75 (H) 15 - 41 U/L   ALT 98 (H) 14 - 54 U/L   Alkaline Phosphatase 253 (H) 38 - 126 U/L   Total Bilirubin 1.8 (H) 0.3 - 1.2 mg/dL   GFR calc non Af Amer >60 >60 mL/min   GFR calc Af Amer >60 >60 mL/min   Anion gap 13 5 - 15  Lipase, blood  Result Value Ref Range   Lipase 38 22 - 51 U/L  Lactic acid, plasma  Result Value Ref Range   Lactic Acid, Venous 2.7 (HH) 0.5 - 2.0 mmol/L     Imaging Review Dg Chest Port 1 View  02/10/2015   CLINICAL DATA:  Syncopal episode today, history of breast cancer  EXAM: PORTABLE CHEST - 1 VIEW  COMPARISON:  09/20/2012  FINDINGS: Heart size unchanged and normal. Port-A-Cath on the right is stable. The left lung is clear.  There is opacity over the right lower lobe which appears to represent a combination of small to moderate pleural effusion and consolidation. This is new from prior studies.  IMPRESSION: New right effusion and lower lobe consolidation.   Electronically Signed   By: Skipper Cliche M.D.   On: 02/10/2015 14:27     EKG Interpretation None     ED ECG REPORT   Date: 02/10/2015  Rate:109  Rhythm: sinus tachycardia  QRS Axis: normal  Intervals: normal   ST/T Wave abnormalities: nonspecific T wave changes  Conduction Disutrbances:none  Narrative Interpretation:   Old EKG Reviewed: none available  I have personally reviewed the EKG tracing and agree with the computerized printout as noted.   CRITICAL CARE Performed by: Fredia Sorrow Total critical care time: 30 Critical care time was exclusive of separately billable procedures and treating other patients. Critical care was necessary to treat or prevent imminent or life-threatening deterioration. Critical care was time spent personally by me on the following activities: development of treatment plan with patient and/or surrogate as well as nursing, discussions with consultants, evaluation of patient's response to treatment, examination of patient, obtaining history from patient or surrogate, ordering and performing treatments and interventions, ordering and review of laboratory studies, ordering and review of radiographic studies, pulse oximetry and re-evaluation of patient's condition.  MDM   Final diagnoses:  Syncope  HCAP (healthcare-associated pneumonia)  Sepsis, due to unspecified organism     Patient undergoing chemotherapy for metastatic breast cancer involving the liver. Patient has syncopal episode did not fall her husband caught her. She was getting out of the tub. On arrival here patient tachycardic. Blood pressures in waxing and waning highest systolic pressure here today has been 102. Lois recorded has been 6. Patient's most recent blood pressure was 90 systolic. Patient is now received the over 2-1/2 L of fluid. No significant change. Chest x-ray consistent with a pneumonia and infusion. Patient's lactic acid is elevated patient's white blood cell count is 20,000. All findings consistent with sepsis probably due to a healthcare acquired pneumonia. Patient's had cultures done antibody started. Antibody started whereas Zosyn and vancomycin.  Patient will continue to receive  fluids. Patient's potassium also was slightly low at 2.9. Patient given some oral potassium.  Patient has a Port-A-Cath is going to be accessed for the second IV. Will make sure patient has 2 IVs prior to going upstairs. EKG was consistent with a sinus tach. No evidence of any significant renal insufficiency. Liver function tests are essentially unchanged from labs just a few days ago.  Patient's respiratory status is been good oxygen saturation is have been in the upper 90s. No significant tachypnea.  Fredia Sorrow, MD 02/10/15 [place], MD 02/10/15 [phone]

## 2015-02-10 NOTE — ED Notes (Signed)
Report given to Justice Rocher, RN for room ICU-07.

## 2015-02-10 NOTE — Progress Notes (Addendum)
[place] for Vancomycin & Zosyn Indication: pneumonia  Allergies  Allergen Reactions  . Metronidazole Hives and Itching  . Sulfonamide Derivatives Hives, Itching and Rash    Patient Measurements: Height: 5\' 4"  (162.6 cm) Weight: 160 lb (72.576 kg) IBW/kg (Calculated) : 54.7  Vital Signs: Temp: 97.6 F (36.4 C) (06/26 1320) Temp Source: Oral (06/26 1320) BP: 92/59 mmHg (06/26 1400) Pulse Rate: 102 (06/26 1415) Intake/Output from previous day:   Intake/Output from this shift:    Labs:  Recent Labs  02/10/15 1358  WBC 20.4*  HGB 13.8  PLT 202  CREATININE 0.81   Estimated Creatinine Clearance: 85.7 mL/min (by C-G formula based on Cr of 0.81). No results for input(s): VANCOTROUGH, VANCOPEAK, VANCORANDOM, GENTTROUGH, GENTPEAK, GENTRANDOM, TOBRATROUGH, TOBRAPEAK, TOBRARND, AMIKACINPEAK, AMIKACINTROU, AMIKACIN in the last 72 hours.   Microbiology: No results found for this or any previous visit (from the past 720 hour(s)).  Anti-infectives    Start     Dose/Rate Route Frequency Ordered Stop   02/10/15 1500  piperacillin-tazobactam (ZOSYN) IVPB 3.375 g     3.375 g 100 mL/hr over 30 Minutes Intravenous 3 times per day 02/10/15 1450     02/10/15 1500  vancomycin (VANCOCIN) IVPB 1000 mg/200 mL premix     1,000 mg 200 mL/hr over 60 Minutes Intravenous  Once 02/10/15 1453        Assessment: 44 yo F who presented with syncope & generalized weakness.  She is afebrile, but WBC and lactic acid levels are elevated.  CXR + PNA.   She is also noted to have metastatic breast CA currently undergoing chemotherapy.  Empiric, broad-spectrum antibiotics were initiated in ED for HCAP.  Also noted CCM recommendation to also cover for CAP.   Renal function is at patient's baseline.  Estimated CrCl > 87ml/min.     Goal of Therapy:  Vancomycin trough level 15-20 mcg/ml  Plan:  Zosyn 3.375gm IV Q8h to be infused over 4hrs Vancomycin 750mg  IV q8h Check  Vancomycin trough at steady state Monitor renal function and cx data  *Consider add Zithromax for CAP coverage  Duration of therapy per MD  Biagio Borg 02/10/2015,2:55 PM

## 2015-02-10 NOTE — Progress Notes (Signed)
eLink Physician-Brief Progress Note Patient Name: Brenda Avery DOB: 1971/03/03 MRN: [id number]   Date of Service  02/10/2015  HPI/Events of Note  44 yr old female with metastatic breast ca presents with presumed pneumonia and parapneumonic effusion.  Presently stable from hemodynamic and resp standpoint.  eICU Interventions  Chart reviewed. I recommend diagnostic/therapeutic thoracentesis with cytology to r/o malignancy I would also add levaquin to present abx to cover community acquired organisms also     Intervention Category Evaluation Type: New Patient Evaluation  Mauri [place], P 02/10/2015, 6:53 PM

## 2015-02-10 NOTE — ED Notes (Signed)
CRITICAL VALUE ALERT  Critical value received:  Lactic 2.7  Date of notification:  02/10/15  Time of notification:  1548  Critical value read back:Yes.    Nurse who received alert:  Rminter, RN  MD notified (1st page):  Dr. Rogene Houston  Time of first page:  1548  MD notified (2nd page):  Time of second page:  Responding MD:  ED MD  Time MD responded:  [phone]

## 2015-02-10 NOTE — ED Notes (Signed)
Pt reports is being treated for liver cancer.  Reports had a new chemo treatment Friday.  Today felt weak and passed out at home.  Pt presently alert and oriented, denies pain.  Reports generalized weakness.

## 2015-02-11 ENCOUNTER — Other Ambulatory Visit: Payer: Self-pay

## 2015-02-11 DIAGNOSIS — C7951 Secondary malignant neoplasm of bone: Secondary | ICD-10-CM

## 2015-02-11 DIAGNOSIS — E871 Hypo-osmolality and hyponatremia: Secondary | ICD-10-CM | POA: Diagnosis present

## 2015-02-11 DIAGNOSIS — R55 Syncope and collapse: Secondary | ICD-10-CM

## 2015-02-11 DIAGNOSIS — E43 Unspecified severe protein-calorie malnutrition: Secondary | ICD-10-CM | POA: Insufficient documentation

## 2015-02-11 DIAGNOSIS — C50912 Malignant neoplasm of unspecified site of left female breast: Secondary | ICD-10-CM

## 2015-02-11 DIAGNOSIS — C787 Secondary malignant neoplasm of liver and intrahepatic bile duct: Secondary | ICD-10-CM

## 2015-02-11 DIAGNOSIS — J189 Pneumonia, unspecified organism: Secondary | ICD-10-CM

## 2015-02-11 LAB — BASIC METABOLIC PANEL
Anion gap: 8 (ref 5–15)
BUN: 7 mg/dL (ref 6–20)
CALCIUM: 6.5 mg/dL — AB (ref 8.9–10.3)
CO2: 28 mmol/L (ref 22–32)
CREATININE: 0.54 mg/dL (ref 0.44–1.00)
Chloride: 92 mmol/L — ABNORMAL LOW (ref 101–111)
GFR calc Af Amer: >60 mL/min (ref >60)
GFR calc non Af Amer: >60 mL/min (ref >60)
Glucose, Bld: 309 mg/dL — ABNORMAL HIGH (ref 65–99)
Potassium: 2.9 mmol/L — ABNORMAL LOW (ref 3.5–5.1)
Sodium: 128 mmol/L — ABNORMAL LOW (ref 135–145)

## 2015-02-11 LAB — CBC
HCT: 32.8 % — ABNORMAL LOW (ref 36.0–46.0)
Hemoglobin: 11.1 g/dL — ABNORMAL LOW (ref 12.0–15.0)
MCH: 26.8 pg (ref 26.0–34.0)
MCHC: 33.8 g/dL (ref 30.0–36.0)
MCV: 79.2 fL (ref 78.0–100.0)
PLATELETS: 168 10*3/uL (ref 150–400)
RBC: 4.14 MIL/uL (ref 3.87–5.11)
RDW: 16.2 % — ABNORMAL HIGH (ref 11.5–15.5)
WBC: 14.3 10*3/uL — AB (ref 4.0–10.5)

## 2015-02-11 LAB — GLUCOSE, CAPILLARY
GLUCOSE-CAPILLARY: 320 mg/dL — AB (ref 65–99)
GLUCOSE-CAPILLARY: 320 mg/dL — AB (ref 65–99)
Glucose-Capillary: 289 mg/dL — ABNORMAL HIGH (ref 65–99)
Glucose-Capillary: 323 mg/dL — ABNORMAL HIGH (ref 65–99)

## 2015-02-11 LAB — COMPREHENSIVE METABOLIC PANEL
ALT: 77 U/L — ABNORMAL HIGH (ref 14–54)
AST: 57 U/L — AB (ref 15–41)
Albumin: 2 g/dL — ABNORMAL LOW (ref 3.5–5.0)
Alkaline Phosphatase: 192 U/L — ABNORMAL HIGH (ref 38–126)
Anion gap: 9 (ref 5–15)
BILIRUBIN TOTAL: 2 mg/dL — AB (ref 0.3–1.2)
BUN: 9 mg/dL (ref 6–20)
CHLORIDE: 94 mmol/L — AB (ref 101–111)
CO2: 29 mmol/L (ref 22–32)
CREATININE: 0.59 mg/dL (ref 0.44–1.00)
Calcium: 6.7 mg/dL — ABNORMAL LOW (ref 8.9–10.3)
GFR calc non Af Amer: >60 mL/min (ref >60)
GLUCOSE: 327 mg/dL — AB (ref 65–99)
Potassium: 2.9 mmol/L — ABNORMAL LOW (ref 3.5–5.1)
Sodium: 132 mmol/L — ABNORMAL LOW (ref 135–145)
Total Protein: 4.7 g/dL — ABNORMAL LOW (ref 6.5–8.1)

## 2015-02-11 LAB — MAGNESIUM: MAGNESIUM: 1.5 mg/dL — AB (ref 1.7–2.4)

## 2015-02-11 MED ORDER — FLUCONAZOLE 100 MG PO TABS
50.0000 mg | ORAL_TABLET | Freq: Every day | ORAL | Status: DC
  Administered 2015-02-12 – 2015-02-16 (×5): 50 mg via ORAL
  Filled 2015-02-11 (×5): qty 1

## 2015-02-11 MED ORDER — INSULIN ASPART 100 UNIT/ML ~~LOC~~ SOLN: 5.0000 [IU] | Freq: Three times a day (TID) | SUBCUTANEOUS | Status: DC

## 2015-02-11 MED ORDER — DOCUSATE SODIUM 100 MG PO CAPS
200.0000 mg | ORAL_CAPSULE | Freq: Every day | ORAL | Status: DC
Start: 2015-02-11 — End: 2015-02-16
  Administered 2015-02-11 – 2015-02-15 (×5): 200 mg via ORAL
  Filled 2015-02-11 (×5): qty 2

## 2015-02-11 MED ORDER — INSULIN DETEMIR 100 UNIT/ML ~~LOC~~ SOLN
20.0000 [IU] | Freq: Two times a day (BID) | SUBCUTANEOUS | Status: DC
  Administered 2015-02-11 – 2015-02-15 (×9): 20 [IU] via SUBCUTANEOUS
  Filled 2015-02-11 (×14): qty 0.2

## 2015-02-11 MED ORDER — SODIUM CHLORIDE 0.9 % IV BOLUS (SEPSIS)
500.0000 mL | Freq: Once | INTRAVENOUS | Status: AC
  Administered 2015-02-11: 500 mL via INTRAVENOUS

## 2015-02-11 MED ORDER — GLUCERNA SHAKE PO LIQD
237.0000 mL | Freq: Three times a day (TID) | ORAL | Status: DC
  Administered 2015-02-11 – 2015-02-13 (×5): 237 mL via ORAL

## 2015-02-11 MED ORDER — INSULIN ASPART 100 UNIT/ML ~~LOC~~ SOLN
0.0000 [IU] | Freq: Every day | SUBCUTANEOUS | Status: DC
  Administered 2015-02-11: 4 [IU] via SUBCUTANEOUS
  Administered 2015-02-13: 3 [IU] via SUBCUTANEOUS

## 2015-02-11 MED ORDER — MAGNESIUM SULFATE IN D5W 10-5 MG/ML-% IV SOLN
1.0000 g | Freq: Once | INTRAVENOUS | Status: AC
  Administered 2015-02-11: 1 g via INTRAVENOUS
  Filled 2015-02-11: qty 100

## 2015-02-11 MED ORDER — POTASSIUM CHLORIDE 10 MEQ/50ML IV SOLN
10.0000 meq | INTRAVENOUS | Status: AC
  Administered 2015-02-11 (×2): 10 meq via INTRAVENOUS
  Filled 2015-02-11 (×3): qty 50

## 2015-02-11 MED ORDER — PRO-STAT 64 PO LIQD
30.0000 mL | Freq: Three times a day (TID) | ORAL | Status: DC
  Administered 2015-02-12 – 2015-02-13 (×2): 30 mL via ORAL
  Filled 2015-02-11 (×4): qty 30

## 2015-02-11 MED ORDER — POTASSIUM CHLORIDE IN NACL 20-0.9 MEQ/L-% IV SOLN: INTRAVENOUS | Status: DC

## 2015-02-11 MED ORDER — MAGNESIUM SULFATE 50 % IJ SOLN: 1.0000 g | Freq: Once | INTRAVENOUS | Status: DC

## 2015-02-11 MED ORDER — INSULIN ASPART 100 UNIT/ML ~~LOC~~ SOLN
0.0000 [IU] | Freq: Three times a day (TID) | SUBCUTANEOUS | Status: DC
  Administered 2015-02-11 (×2): 15 [IU] via SUBCUTANEOUS
  Administered 2015-02-12: 20 [IU] via SUBCUTANEOUS
  Administered 2015-02-12: 7 [IU] via SUBCUTANEOUS
  Administered 2015-02-12: 15 [IU] via SUBCUTANEOUS
  Administered 2015-02-13 (×2): 7 [IU] via SUBCUTANEOUS
  Administered 2015-02-13 – 2015-02-14 (×3): 11 [IU] via SUBCUTANEOUS
  Administered 2015-02-14: 7 [IU] via SUBCUTANEOUS
  Administered 2015-02-15: 4 [IU] via SUBCUTANEOUS
  Administered 2015-02-15 (×2): 7 [IU] via SUBCUTANEOUS

## 2015-02-11 MED ORDER — POTASSIUM CHLORIDE IN NACL 20-0.9 MEQ/L-% IV SOLN
INTRAVENOUS | Status: DC
  Administered 2015-02-11: 10:00:00 via INTRAVENOUS

## 2015-02-11 MED ORDER — POTASSIUM CHLORIDE 10 MEQ/100ML IV SOLN
10.0000 meq | INTRAVENOUS | Status: AC
  Administered 2015-02-11 (×4): 10 meq via INTRAVENOUS
  Filled 2015-02-11 (×4): qty 100

## 2015-02-11 MED ORDER — INSULIN DETEMIR 100 UNIT/ML ~~LOC~~ SOLN
10.0000 [IU] | Freq: Every day | SUBCUTANEOUS | Status: DC
  Administered 2015-02-11: 10 [IU] via SUBCUTANEOUS
  Filled 2015-02-11: qty 0.1

## 2015-02-11 MED ORDER — POTASSIUM CHLORIDE CRYS ER 10 MEQ PO TBCR
10.0000 meq | EXTENDED_RELEASE_TABLET | Freq: Two times a day (BID) | ORAL | Status: AC
  Administered 2015-02-11 – 2015-02-12 (×2): 10 meq via ORAL
  Filled 2015-02-11 (×2): qty 1

## 2015-02-11 NOTE — Progress Notes (Signed)
Initial Nutrition Assessment  DOCUMENTATION CODES:  Severe malnutrition in context of chronic illness  INTERVENTION:  Glucerna shake, Prostat, Snacks  NUTRITION DIAGNOSIS:  Increased nutrient needs related to catabolic illness, cancer and cancer related treatments as evidenced by estimated needs.  GOAL:  Other (Comment) (if pt wishes  non agressive nutrition care goal will be comfort foods as desired)  MONITOR:  PO intake (progression of care descisions and adjust nutrition interventions accordingly)  REASON FOR ASSESSMENT:  Malnutrition Screening Tool    ASSESSMENT: Pt has hx of breast cancer with metastasis to liver. She is receiving chemotherapy and has been experiencing anorexia and oral thrush.  She was dehydrated on admission and says her intake has been very poor. Recently she has been eating mostly fruits. Denies any nutrition supplements or protein sources. Severe wt loss 10% in 30 days.    Discussed with her the importance of adequate protein and balanced diet. She has palliative consult pending and goals of care being addressed. This will guide her nutrition care recommendations as well. RD will continue to follow.  Nutrition focused exam: mild-moderate muscle depletions and loss of fat mass    Height:  Ht Readings from Last 1 Encounters:  02/10/15 5\' 4"  (1.626 m)    Weight:  Wt Readings from Last 1 Encounters:  02/11/15 160 lb 0.9 oz (72.6 kg)    Ideal Body Weight:  54.55 kg  Wt Readings from Last 10 Encounters:  02/11/15 160 lb 0.9 oz (72.6 kg)  02/06/15 168 lb 14.4 oz (76.613 kg)  02/01/15 168 lb 9.6 oz (76.476 kg)  01/25/15 175 lb 12.8 oz (79.742 kg)  01/22/15 177 lb (80.287 kg)  01/21/15 170 lb (77.111 kg)  01/21/15 172 lb 12.8 oz (78.382 kg)  01/16/15 170 lb 3.1 oz (77.2 kg)  01/07/15 177 lb 8 oz (80.513 kg)  01/03/15 180 lb 5.4 oz (81.8 kg)    BMI:  Body mass index is 27.46 kg/(m^2).  Estimated Nutritional Needs:  Kcal:   1825-2160  Protein:  86-95 gr  Fluid:  1.8-2.1 liters daily  Skin:   WDL  Diet Order:  Diet Carb Modified Fluid consistency:: Thin; Room service appropriate?: Yes  EDUCATION NEEDS:  Education needs addressed related to increasing balanced intake of protein, carbohydrates and healthy fats   Intake/Output Summary (Last 24 hours) at 02/11/15 1710 Last data filed at 02/11/15 1000  Gross per 24 hour  Intake 1873.33 ml  Output   1500 ml  Net 373.33 ml    Last BM:  6/26  Colman Cater MS,RD,CSG,LDN Office: [phone] Pager: [phone]

## 2015-02-11 NOTE — Discharge Instructions (Addendum)
[facility] Stay Proper nutrition can help your body recover from illness and injury.   Foods and beverages high in protein, vitamins, and minerals help rebuild muscle loss, promote healing, & reduce fall risk.   In addition to eating healthy foods, a nutrition shake is an easy, delicious way to get the nutrition you need during and after your hospital stay  It is recommended that you continue to drink 2 bottles per day of:   Glucerna TID and add 1 scoop of whey protein to each for at least 1 month (30 days) after your hospital stay   Tips for adding a nutrition shake into your routine: As allowed, drink one with vitamins or medications instead of water or juice Enjoy one as a tasty mid-morning or afternoon snack Drink cold or make a milkshake out of it Drink one instead of milk with cereal or snacks Use as a coffee creamer   Available at the following grocery stores and pharmacies:           * [place] [phone]            For COUPONS visit: www.ensure.com/join or [email or web address]-[email or web address]   Suggested Substitutions Ensure Plus = Boost Plus = Carnation Breakfast Essentials = Boost Compact Ensure Active Clear = Boost Breeze Glucerna Shake = Boost Glucose Control = Carnation Breakfast Essentials SUGAR FREE

## 2015-02-11 NOTE — Care Management Note (Signed)
Case Management Note  Patient Details  Name: Brenda Avery MRN: [id number] Date of Birth: Dec 09, 1970  Expected Discharge Date:                  Expected Discharge Plan:  [place]  In-House Referral:  Chaplain  Discharge planning Services  CM Consult  Post Acute Care Choice:  Home Health Choice offered to:  Patient  DME Arranged:    DME Agency:     HH Arranged:  PT [place]:  [place]  Status of Service:  In process, will continue to follow  Medicare Important Message Given:    Date Medicare IM Given:    Medicare IM give by:    Date Additional Medicare IM Given:    Additional Medicare Important Message give by:     If discussed at [place] of Stay Meetings, dates discussed:    Additional Comments: Pt is from home, lives with husband and is mostly independent at baseline but has become more weak lately. Pt has met breast cancer. Pt was recently signed up for [facility] PT through the Cancer center but has not received the first visit. Pt will need order for [facility] PT at discharge. Pt thinks it is set up with [facility], CM will f/u with [facility] to determine is she is active with them. Will cont to follow for CM needs.  Sherald Barge, RN 02/11/2015, 2:42 PM

## 2015-02-11 NOTE — Progress Notes (Signed)
Palliative medicine team consult received from Dr. Whitney Muse on 6/27 for inpatient and outpatient palliative team follow for ongoing goals of care and additional support. We will schedule meeting with family during this hospitalization.

## 2015-02-11 NOTE — Progress Notes (Addendum)
[facility] PROGRESS NOTE  Brenda Avery:[id number] DOB: 18-Mar-1971 DOA: 02/10/2015 PCP: Molli Hazard, MD    Code Status: Full code Family Communication: Discussed with patient; family not available Disposition Plan: Discharge when clinically appropriate   Consultants:  Oncology, pending  Procedures:  None  Antibiotics:  Zosyn 6/27  Vancomycin 6/27  HPI/Subjective: Patient says that she is breathing better and overall, she feels better. She is less short of breath at rest. Minimal pleurisy on the right. She has mild nausea, but no vomiting; she vomited last night after receiving oral potassium chloride. She denies subjective fever and chills.  Objective: Filed Vitals:   02/11/15 0800  BP: 95/49  Pulse: 94  Temp:   Resp: 13   temperature 97.9. Oxygen saturation 97% on room air.  Intake/Output Summary (Last 24 hours) at 02/11/15 0928 Last data filed at 02/11/15 0800  Gross per 24 hour  Intake   1575 ml  Output   1150 ml  Net    425 ml   Filed Weights   02/10/15 1320 02/10/15 1620 02/11/15 0500  Weight: 72.576 kg (160 lb) 72.576 kg (160 lb) 72.6 kg (160 lb 0.9 oz)    Exam:   General:  Pleasant alert 44 year old African-American woman in no acute distress.  Oropharynx: No obvious white plaques or exudates on the tongue.  Cardiovascular: S1, S2, no murmurs rubs or gallops.  Respiratory: Faint right upper lobe crackles with decreased breath sounds in the bases; clear on the left.  Abdomen: Positive bowel sounds, soft, nontender, nondistended.  Musculoskeletal/extremities: No pedal edema. No acute hot red joints.  Neurologic: She is alert and oriented 3. Cranial nerves II through XII are intact.   Data Reviewed: Basic Metabolic Panel:  Recent Labs Lab 02/06/15 1031 02/10/15 1358 02/10/15 1905 02/11/15 0443  NA 128* 129*  --  132*  K 3.5 2.9*  --  2.9*  CL 86* 90*  --  94*  CO2 31 26  --  29  GLUCOSE 294* 206*  --  327*  BUN  15 13  --  9  CREATININE 0.65 0.81 0.63 0.59  CALCIUM 8.0* 7.4*  --  6.7*  MG  --   --   --  1.5*   Liver Function Tests:  Recent Labs Lab 02/06/15 1031 02/10/15 1358 02/11/15 0443  AST 69* 75* 57*  ALT 104* 98* 77*  ALKPHOS 257* 253* 192*  BILITOT 1.6* 1.8* 2.0*  PROT 6.0* 5.6* 4.7*  ALBUMIN 2.3* 2.4* 2.0*    Recent Labs Lab 02/10/15 1358  LIPASE 38   No results for input(s): AMMONIA in the last 168 hours. CBC:  Recent Labs Lab 02/04/15 1234 02/06/15 1031 02/10/15 1358 02/10/15 1905 02/11/15 0443  WBC 1.7* 3.8* 20.4* 17.4* 14.3*  NEUTROABS 0.7* 2.4 19.4*  --   --   HGB 12.6 13.2 13.8 11.8* 11.1*  HCT 36.3 38.1 40.0 34.5* 32.8*  MCV 78.4 78.7 79.5 79.3 79.2  PLT 217 233 202 185 168   Cardiac Enzymes: No results for input(s): CKTOTAL, CKMB, CKMBINDEX, TROPONINI in the last 168 hours. BNP (last 3 results) No results for input(s): BNP in the last 8760 hours.  ProBNP (last 3 results) No results for input(s): PROBNP in the last 8760 hours.  CBG:  Recent Labs Lab 02/10/15 1947 02/11/15 0740  GLUCAP 301* 289*    Recent Results (from the past 240 hour(s))  Culture, blood (routine x 2)     Status: None (Preliminary result)   Collection  Time: 02/10/15  3:08 PM  Result Value Ref Range Status   Specimen Description BLOOD RIGHT ARM  Final   Special Requests BOTTLES DRAWN AEROBIC ONLY 6CC  Final   Culture PENDING  Incomplete   Report Status PENDING  Incomplete  Culture, blood (routine x 2)     Status: None (Preliminary result)   Collection Time: 02/10/15  3:16 PM  Result Value Ref Range Status   Specimen Description BLOOD RIGHT HAND  Final   Special Requests BOTTLES DRAWN AEROBIC ONLY [place]  Final   Culture PENDING  Incomplete   Report Status PENDING  Incomplete  MRSA PCR Screening     Status: None   Collection Time: 02/10/15  6:10 PM  Result Value Ref Range Status   MRSA by PCR NEGATIVE NEGATIVE Final    Comment:        The GeneXpert MRSA Assay  (FDA approved for NASAL specimens only), is one component of a comprehensive MRSA colonization surveillance program. It is not intended to diagnose MRSA infection nor to guide or monitor treatment for MRSA infections.      Studies: Dg Chest Port 1 View  02/10/2015   CLINICAL DATA:  Syncopal episode today, history of breast cancer  EXAM: PORTABLE CHEST - 1 VIEW  COMPARISON:  09/20/2012  FINDINGS: Heart size unchanged and normal. Port-A-Cath on the right is stable. The left lung is clear.  There is opacity over the right lower lobe which appears to represent a combination of small to moderate pleural effusion and consolidation. This is new from prior studies.  IMPRESSION: New right effusion and lower lobe consolidation.   Electronically Signed   By: Skipper Cliche M.D.   On: 02/10/2015 14:27    Scheduled Meds: . antiseptic oral rinse  7 mL Mouth Rinse q12n4p  . calcium-vitamin D  2 tablet Oral BID  . chlorhexidine  15 mL Mouth Rinse BID  . enoxaparin (LOVENOX) injection  40 mg Subcutaneous Q24H  . fentaNYL  12.5 mcg Transdermal Q72H  . fluconazole  100 mg Oral Daily  . insulin aspart  0-20 Units Subcutaneous TID WC  . insulin aspart  0-5 Units Subcutaneous QHS  . insulin detemir  10 Units Subcutaneous QHS  . ondansetron  4 mg Intravenous Once  . pantoprazole  40 mg Oral Daily  . piperacillin-tazobactam (ZOSYN)  IV  3.375 g Intravenous 3 times per day  . potassium chloride  10 mEq Intravenous Q1 Hr x 4  . sodium chloride  3 mL Intravenous Q12H  . vancomycin  750 mg Intravenous Q8H   Continuous Infusions: . 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 Sepsis,  secondary to healthcare associated pneumonia; parapneumonic effusion. On admission, the patient was afebrile, but her white blood cell count was 20.4. She was mildly tachycardic and her blood pressure was low-normal. Her lactic acid level was 2.7. Her chest x-ray revealed new right effusion and lower lobe consolidation/pneumonia. Blood cultures were ordered and she was started on Zosyn and vancomycin and IV fluids. IV hydrocortisone is being given 3 doses. -Blood cultures are negative to date. -She remains afebrile. Her white blood  cell count is improving. Her blood pressure is stable. -E Link M.D. recommended a diagnostic/therapeutic thoracentesis, but radiographically, the patient has a small to moderate effusion, she has known metastatic breast cancer, and she is symptomatically improved; so we'll hold off on ordering a thoracentesis. Also mentioned was covering for atypicals; but I would think it would be less likely in this patient with metastatic breast cancer. She is improving clinically. Will consider starting azithromycin, but will order urine Legionella antigen first.  Syncope and collapse. She had a syncopal episode following a bowel movement at home. The etiology could have been vasovagal, but in the setting of sepsis, dehydration/hypovolemia, and electrolyte derangements, the etiology is likely multifactorial. She is currently neurologically intact. Her TSH was within normal limits. - We'll continue hydration and treatment of sepsis/pneumonia.  Hypokalemia. The patient's serum potassium was 2.9 on admission. Magnesium level was ordered and found to be in slightly low at 1.5. Will treat with potassium chloride IV runs and continue potassium added to the IV fluids. We'll give 1 g of magnesium sulfate. We'll continue to monitor her serum potassium.  Hyponatremia. The patient's serum sodium was 129 on admission. This is likely from hypovolemia/dehydration. She was started on normal saline  with improvement in her serum sodium. Continue IV fluids.  Type 2 diabetes mellitus. The patient had been treated with diet alone. Her blood glucose has been uncontrolled, which could be attributed to the dextrose in the IV fluids and steroid. Dextrose was discontinued and IV fluids. She will be given only 3 doses of IV hydrocortisone. Insulin has been titrated up to the resistance scale and Levemir has been added. -We'll order hemoglobin A1c.  Recent oral thrush. The patient was started on IV Diflucan. On exam, there is no evidence of oral thrush. We'll continue the dose of IV Diflucan today and consider discontinuing it tomorrow.  Breast cancer, with metastasis to multiple sites. The patient is under the care of Dr. Whitney Muse and she is undergoing chemotherapy and is status post radiation therapy. Per Dr. Donald Pore last office note, there was a discussion about end-of-life/quality of life issues. Per request of the patient/family, will consult Dr. Whitney Muse.      Time spent: 45 minutes critical care time.    [facility] Pager [phone]. If 7PM-7AM, please contact night-coverage at [email or web address], password [email or web address] 02/11/2015, 9:28 AM  LOS: 1 day

## 2015-02-11 NOTE — Consult Note (Signed)
Patient seen in consult today.  She is admitted for pneumonia.  Chart is reviewed.    Patient reports that she went to the restroom.  She denies any diarrhea or constipation.  She reports dizziness with her trip to the bathroom.  Her husband assisted with ambulating the patient out of the bathroom and on her wait out she "passed out."  She subsequently reported to the [facility] ED and admitted.  Patient and husband educated about pneumonia in the setting of immunosuppression secondary to systemic chemotherapy.  I personally reviewed and went over laboratory results with the patient.  The results are noted within this dictation.  Liver function improving.  Palliative medicine consulted.    Patient previously treated in [facility] for metastatic breast cancer until she transferred her oncology care to [facility].  We started seeing the patient approximatley 2 weeks ago at which time it was clear that goals of care, end of life issues, and death were not discussed nor addressed.  The patient and family affirmed the aforementioned with [place].  As a result, we are behind getting the patient prepared for facing her mortality.  As a result, Palliative medicine was consulted to assist.  Complicating the situation even further is the fact that she and her husband have two young children.     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 Liver metastases   09/14/2012 Cancer Staging PET scan- multiple hypermetabolic areas left pectoral node the breast mass, left hilar, suprahilar, right hilar, right suprahilar, subcarinal lymph node, liver, right iliac, bilateral inguinal nodes, sternum lytic lesion    09/19/2012 Procedure Liver biopsy- Sarcoidosis   09/26/2012 - 01/10/2013 Chemotherapy AC x 4 followed by weekly Paclitaxel x 12. Marker response supraclavicular and mediastinal nodes decreasing the breast mass and  liver lesions resolution of sternal activity   04/10/2013 Surgery Bilateral mastectomies, left decidual IDC grade 2 x 0.7 cm with high-grade DCIS LV I 8/11 lymph nodes positive with extracapsular extension ER 99% PR 40%, HER-2 negative. R: No cancer   04/12/2013 Procedure Genetic testing did not reveal BRCA mutations   05/22/2013 - 06/23/2013 Radiation Therapy Adjuvant radiation therapy with Xeloda   06/23/2013 -  Anti-estrogen oral therapy Tamoxifen 20 mg daily with Zoladex every 3 months   05/28/2014 Relapse/Recurrence Bony metastatic disease: Sternum biopsy metastatic carcinoma, HER-2 negative ratio 0.74   06/15/2014 -  Anti-estrogen oral therapy Ibrance, faslodex, xgeva   01/16/2015 Initial Biopsy Successful ultrasound right hepatic biopsy. Diagnosis Liver, needle/core biopsy, right mass - POSITIVE FOR METASTATIC CARCINOMA. Estrogen Receptor: 100%, POSITIVE, STRONG STAINING INTENSITY (PERFORMED MANUALLY) Progesterone Receptor: 0%, NEGATIVE     01/24/2015 -  Chemotherapy [place] with Neulasta support    She was given Neulasta ONPRO which was administered on 02/09/2015.  As a result, this will need to be considered if WBC climbs.   Colace 2 tabs daily at HS is ordered.   Patient and plan discussed with Dr. Ancil Linsey and she is in agreement with the aforementioned.   Robynn Pane, PA-C 02/11/2015 6:19 PM  I have started end-of-life discussions with the patient and her husband. They are certainly more aware of the very serious nature of her disease but the patient has become more reluctant to discuss planning and goals.  I am trying to prepare her for the possibility and probability that although we may improve her disease in her liver, she may not return to her  prior level of functioning; Nor will our results be sustained long term. She spends most of her day in bed or sitting in a recliner at home. She has limited oral intake. She has however had improvement in her right upper quadrant pain,  nausea, vomiting since starting her chemotherapy. Her performance status overall is poor and prognosis remains poor. She has two children at home ages 44 and 41, I have addressed the need to discuss with them the terminal nature of her disease.  I have asked for additional help from palliative care, and I feel the patient and her husband will greatly benefit from additional end-of-life discussion, end-of-life planning from others besides myself.  I brought up a living will in a recent visit and the patient stated she was unaware of what that was, her husband stated he knew what it was. The patient does not currently have a living will nor has she been willing to discuss DO NOT RESUSCITATE status.  Donald Pore MD

## 2015-02-12 ENCOUNTER — Inpatient Hospital Stay (HOSPITAL_COMMUNITY): Payer: BLUE CROSS/BLUE SHIELD

## 2015-02-12 DIAGNOSIS — E876 Hypokalemia: Secondary | ICD-10-CM

## 2015-02-12 LAB — LEGIONELLA ANTIGEN, URINE

## 2015-02-12 LAB — URINE CULTURE

## 2015-02-12 LAB — BASIC METABOLIC PANEL
ANION GAP: 9 (ref 5–15)
Anion gap: 11 (ref 5–15)
BUN: 7 mg/dL (ref 6–20)
BUN: 7 mg/dL (ref 6–20)
CHLORIDE: 93 mmol/L — AB (ref 101–111)
CO2: 29 mmol/L (ref 22–32)
CO2: 27 mmol/L (ref 22–32)
CREATININE: 0.6 mg/dL (ref 0.44–1.00)
Calcium: 6.9 mg/dL — ABNORMAL LOW (ref 8.9–10.3)
Calcium: 7.3 mg/dL — ABNORMAL LOW (ref 8.9–10.3)
Chloride: 93 mmol/L — ABNORMAL LOW (ref 101–111)
Creatinine, Ser: 0.62 mg/dL (ref 0.44–1.00)
Glucose, Bld: 278 mg/dL — ABNORMAL HIGH (ref 65–99)
Glucose, Bld: 305 mg/dL — ABNORMAL HIGH (ref 65–99)
Potassium: 2.7 mmol/L — CL (ref 3.5–5.1)
Potassium: 3 mmol/L — ABNORMAL LOW (ref 3.5–5.1)
SODIUM: 131 mmol/L — AB (ref 135–145)
Sodium: 131 mmol/L — ABNORMAL LOW (ref 135–145)

## 2015-02-12 LAB — CBC
HEMATOCRIT: 32.4 % — AB (ref 36.0–46.0)
Hemoglobin: 10.8 g/dL — ABNORMAL LOW (ref 12.0–15.0)
MCH: 26.3 pg (ref 26.0–34.0)
MCHC: 33.3 g/dL (ref 30.0–36.0)
MCV: 79 fL (ref 78.0–100.0)
PLATELETS: 173 10*3/uL (ref 150–400)
RBC: 4.1 MIL/uL (ref 3.87–5.11)
RDW: 16.2 % — AB (ref 11.5–15.5)
WBC: 5.8 10*3/uL (ref 4.0–10.5)

## 2015-02-12 LAB — GLUCOSE, CAPILLARY
GLUCOSE-CAPILLARY: 139 mg/dL — AB (ref 65–99)
Glucose-Capillary: 319 mg/dL — ABNORMAL HIGH (ref 65–99)
Glucose-Capillary: 354 mg/dL — ABNORMAL HIGH (ref 65–99)
Glucose-Capillary: 202 mg/dL — ABNORMAL HIGH (ref 65–99)

## 2015-02-12 LAB — NA AND K (SODIUM & POTASSIUM), RAND UR
POTASSIUM UR: 41 mmol/L
Sodium, Ur: 51 mmol/L

## 2015-02-12 LAB — CREATININE, URINE, RANDOM: Creatinine, Urine: 48.7 mg/dL

## 2015-02-12 MED ORDER — MAGNESIUM SULFATE 50 % IJ SOLN: 1.0000 g | Freq: Once | INTRAVENOUS | Status: DC

## 2015-02-12 MED ORDER — POTASSIUM CHLORIDE CRYS ER 10 MEQ PO TBCR: 10.0000 meq | EXTENDED_RELEASE_TABLET | Freq: Two times a day (BID) | ORAL | Status: DC

## 2015-02-12 MED ORDER — POTASSIUM CHLORIDE 10 MEQ/100ML IV SOLN
10.0000 meq | INTRAVENOUS | Status: AC
  Administered 2015-02-12 (×4): 10 meq via INTRAVENOUS
  Filled 2015-02-12 (×5): qty 100

## 2015-02-12 MED ORDER — MAGNESIUM SULFATE IN D5W 10-5 MG/ML-% IV SOLN
1.0000 g | Freq: Once | INTRAVENOUS | Status: AC
  Administered 2015-02-12: 1 g via INTRAVENOUS
  Filled 2015-02-12: qty 100

## 2015-02-12 MED ORDER — INSULIN ASPART 100 UNIT/ML ~~LOC~~ SOLN
8.0000 [IU] | Freq: Three times a day (TID) | SUBCUTANEOUS | Status: DC
  Administered 2015-02-12 – 2015-02-15 (×11): 8 [IU] via SUBCUTANEOUS

## 2015-02-12 MED ORDER — POTASSIUM CHLORIDE IN NACL 40-0.9 MEQ/L-% IV SOLN
INTRAVENOUS | Status: DC
  Administered 2015-02-12 – 2015-02-14 (×4): 100 mL/h via INTRAVENOUS

## 2015-02-12 MED ORDER — POTASSIUM CHLORIDE CRYS ER 10 MEQ PO TBCR
10.0000 meq | EXTENDED_RELEASE_TABLET | Freq: Two times a day (BID) | ORAL | Status: AC
  Administered 2015-02-12 – 2015-02-15 (×6): 10 meq via ORAL
  Filled 2015-02-12 (×6): qty 1

## 2015-02-12 NOTE — Progress Notes (Addendum)
[facility] PROGRESS NOTE  Brenda Avery ZOX:[id number] DOB: [birth year] DOA: 02/10/2015 PCP: Molli Hazard, MD    Code Status: Full code Family Communication: Discussed with husband. Disposition Plan: Discharge when clinically appropriate   Consultants:  Oncology  Procedures:  None  Antibiotics:  Zosyn 6/27>>  Vancomycin 6/27>>  HPI/Subjective: Patient says she is breathing better and overall, she feels better, but has port-a-cath pain with the IV potassium runs. She is less short of breath at rest. Minimal pleurisy on the right. She has mild nausea, but no vomiting.  Objective: Filed Vitals:   02/12/15 0813  BP:   Pulse:   Temp: 98.1 F (36.7 C)  Resp:    BP 89/52 HR 121. RR 15. O2 sats 97% on 02.  Intake/Output Summary (Last 24 hours) at 02/12/15 0953 Last data filed at 02/12/15 0700  Gross per 24 hour  Intake 3106.5 ml  Output   1525 ml  Net 1581.5 ml   Filed Weights   02/10/15 1620 02/11/15 0500 02/12/15 0500  Weight: 72.576 kg (160 lb) 72.6 kg (160 lb 0.9 oz) 73.4 kg (161 lb 13.1 oz)    Exam:   General:  Pleasant alert 44 year old African-American woman in no acute distress.  Oropharynx: No obvious white plaques or exudates on the tongue.  Cardiovascular: S1, S2, no murmurs rubs or gallops.  Respiratory: Faint right upper lobe crackles with global decreased breath sounds in the bases.  Abdomen: Positive bowel sounds, soft, nontender, nondistended.  Musculoskeletal/extremities: No pedal edema. No acute hot red joints.  Neurologic: She is alert and oriented 3. Cranial nerves II through XII are intact.   Data Reviewed: Basic Metabolic Panel:  Recent Labs Lab 02/06/15 1031 02/10/15 1358 02/10/15 1905 02/11/15 0443 02/11/15 1555 02/12/15 0430  NA 128* 129*  --  132* 128* 131*  K 3.5 2.9*  --  2.9* 2.9* 2.7*  CL 86* 90*  --  94* 92* 93*  CO2 31 26  --  29 28 29   GLUCOSE 294* 206*  --  327* 309* 278*  BUN 15 13  --  9 7  7   CREATININE 0.65 0.81 0.63 0.59 0.54 0.60  CALCIUM 8.0* 7.4*  --  6.7* 6.5* 6.9*  MG  --   --   --  1.5*  --   --    Liver Function Tests:  Recent Labs Lab 02/06/15 1031 02/10/15 1358 02/11/15 0443  AST 69* 75* 57*  ALT 104* 98* 77*  ALKPHOS 257* 253* 192*  BILITOT 1.6* 1.8* 2.0*  PROT 6.0* 5.6* 4.7*  ALBUMIN 2.3* 2.4* 2.0*    Recent Labs Lab 02/10/15 1358  LIPASE 38   No results for input(s): AMMONIA in the last 168 hours. CBC:  Recent Labs Lab 02/06/15 1031 02/10/15 1358 02/10/15 1905 02/11/15 0443 02/12/15 0430  WBC 3.8* 20.4* 17.4* 14.3* 5.8  NEUTROABS 2.4 19.4*  --   --   --   HGB 13.2 13.8 11.8* 11.1* 10.8*  HCT 38.1 40.0 34.5* 32.8* 32.4*  MCV 78.7 79.5 79.3 79.2 79.0  PLT 233 202 185 168 173   Cardiac Enzymes: No results for input(s): CKTOTAL, CKMB, CKMBINDEX, TROPONINI in the last 168 hours. BNP (last 3 results) No results for input(s): BNP in the last 8760 hours.  ProBNP (last 3 results) No results for input(s): PROBNP in the last 8760 hours.  CBG:  Recent Labs Lab 02/11/15 0740 02/11/15 1112 02/11/15 1623 02/11/15 2112 02/12/15 0749  GLUCAP 289* 320* 323* 320* 319*  Recent Results (from the past 240 hour(s))  Culture, blood (routine x 2)     Status: None (Preliminary result)   Collection Time: 02/10/15  3:08 PM  Result Value Ref Range Status   Specimen Description BLOOD RIGHT ARM  Final   Special Requests BOTTLES DRAWN AEROBIC ONLY 6CC  Final   Culture NO GROWTH < 24 HOURS  Final   Report Status PENDING  Incomplete  Culture, blood (routine x 2)     Status: None (Preliminary result)   Collection Time: 02/10/15  3:16 PM  Result Value Ref Range Status   Specimen Description BLOOD RIGHT HAND  Final   Special Requests BOTTLES DRAWN AEROBIC ONLY 6CC  Final   Culture NO GROWTH < 24 HOURS  Final   Report Status PENDING  Incomplete  MRSA PCR Screening     Status: None   Collection Time: 02/10/15  6:10 PM  Result Value Ref Range  Status   MRSA by PCR NEGATIVE NEGATIVE Final    Comment:        The GeneXpert MRSA Assay (FDA approved for NASAL specimens only), is one component of a comprehensive MRSA colonization surveillance program. It is not intended to diagnose MRSA infection nor to guide or monitor treatment for MRSA infections.      Studies: Dg Chest Port 1 View  02/10/2015   CLINICAL DATA:  Syncopal episode today, history of breast cancer  EXAM: PORTABLE CHEST - 1 VIEW  COMPARISON:  09/20/2012  FINDINGS: Heart size unchanged and normal. Port-A-Cath on the right is stable. The left lung is clear.  There is opacity over the right lower lobe which appears to represent a combination of small to moderate pleural effusion and consolidation. This is new from prior studies.  IMPRESSION: New right effusion and lower lobe consolidation.   Electronically Signed   By: Skipper Cliche M.D.   On: 02/10/2015 14:27    Scheduled Meds: . antiseptic oral rinse  7 mL Mouth Rinse q12n4p  . calcium-vitamin D  2 tablet Oral BID  . chlorhexidine  15 mL Mouth Rinse BID  . docusate sodium  200 mg Oral QHS  . enoxaparin (LOVENOX) injection  40 mg Subcutaneous Q24H  . feeding supplement (GLUCERNA SHAKE)  237 mL Oral TID BM  . feeding supplement (PRO-STAT 64)  30 mL Oral TID WC  . fentaNYL  12.5 mcg Transdermal Q72H  . fluconazole  50 mg Oral Daily  . insulin aspart  0-20 Units Subcutaneous TID WC  . insulin aspart  0-5 Units Subcutaneous QHS  . insulin aspart  8 Units Subcutaneous TID WC  . insulin detemir  20 Units Subcutaneous BID  . ondansetron  4 mg Intravenous Once  . pantoprazole  40 mg Oral Daily  . piperacillin-tazobactam (ZOSYN)  IV  3.375 g Intravenous 3 times per day  . potassium chloride  10 mEq Oral BID  . potassium chloride  10 mEq Intravenous Q1 Hr x 4  . sodium chloride  3 mL Intravenous Q12H  . vancomycin  750 mg Intravenous Q8H   Continuous Infusions: . 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 Sepsis, secondary to healthcare associated pneumonia; parapneumonic effusion. On admission, the patient was afebrile, but her white blood cell count was 20.4. She was mildly tachycardic and her blood pressure was low-normal. Her lactic acid level was 2.7. Her chest x-ray revealed new right effusion and lower lobe consolidation/pneumonia. Blood cultures were ordered and she was started on Zosyn and vancomycin and IV fluids. IV hydrocortisone is being given 3 doses. -Blood cultures are negative to date. -She remains afebrile. Her white blood cell count has improved. Her blood pressure has has been intermittently low and she continues to be tachycardic despite vigorous IV fluids. -IV fluid rate was decreased yesterday, but will increase it again. -Will order an ultrasound of her chest and possible therapeutic thoracentesis tomorrow (Lovenox has to be held 24 hours.). The pleural effusion may be causing some respiratory compromise which could be increasing her heart rate. -We'll order a follow-up chest x-ray tomorrow morning. We'll also order a 2-D echocardiogram for further evaluation.  Syncope and collapse. She had a syncopal episode following a bowel movement at home. The etiology could have been vasovagal, but in the setting of sepsis, dehydration/hypovolemia, and electrolyte derangements, the etiology is likely multifactorial. She is currently neurologically intact. Her TSH was within normal limits. - We'll continue hydration and treatment of sepsis/pneumonia.  Hypokalemia. The patient's serum potassium was 2.9 on admission.  Magnesium level was ordered and found to be in slightly low at 1.5. She has been given at least 9 runs of potassium chloride with no significant improvement in her serum potassium. She was given 1 g of magnesium sulfate. -She has been ordered another 4 IV runs of potassium. -We'll give her another gram of magnesium sulfate. -We will add 10 mEq oral KCl twice a day as tolerated 3 days. -We'll check spot serum potassium, sodium, and creatinine levels. -Of note, the patient has some discomfort with IV runs of potassium chloride. Given that oncology has ordered a palliative care consult, I would favor not giving her significantly more IV runs of potassium secondary to discomfort.  Hyponatremia. The patient's serum sodium was 129 on admission. This is likely from hypovolemia/dehydration. She was started on normal saline with improvement in her serum sodium. Continue IV fluids.  Type 2 diabetes mellitus. The patient had been treated with diet alone. Her blood glucose has been uncontrolled, which could be attributed to the dextrose in the IV fluids and steroid. Dextrose was discontinued and IV fluids. She will be given only 3 doses of IV hydrocortisone. Insulin has been titrated up to the resistance scale and Levemir has been added twice a day. We'll add mealtime coverage of NovoLog. Hemoglobin A1c. is pending.  Recent oral thrush. The patient was started on IV Diflucan. On exam, there is no evidence of oral thrush. We'll discontinue Diflucan after today's dose.  Elevated liver transaminases. The patient's LFTs are elevated, likely secondary to metastasis to the liver. We'll continue to monitor.  Breast cancer, with metastasis to multiple sites. The patient is under the care of Dr. Whitney Muse and she is undergoing chemotherapy and is status post radiation therapy. Dr. Whitney Muse was consulted. She ordered a palliative care medicine consult to assist with end-of-life discussion and goals of care. The  patient's prognosis is poor, but the situation is complicated given that she has a husband and 2 young children. Patient remains a full code. Will await palliative care medicine consultation.    Time spent: 40 minutes  [place] [facility] Pager [phone]. If 7PM-7AM, please contact night-coverage at [email or web address], password [facility] 02/12/2015, 9:53 AM  LOS: 2 days

## 2015-02-12 NOTE — Evaluation (Signed)
Physical Therapy Evaluation Patient Details Name: Brenda Avery MRN: [id number] DOB: September 05, 1970 Today's Date: 02/12/2015   History of Present Illness  Brenda Avery is an 44 y.o. female with hx of metatastatic breast ca to multiple sites including liver mets, hx of DM, HLD, currently undergoing chemotherapy, s/p bilateral mastectomy, s/p portacath placement, has decreased appetite, nausea, vomiting, and presented to the ER with syncope. She did not hit her head or suffered any significant trauma as her husband caught her. Evaluation in the ER showed hypotension with tachycardia, CXR showed PNA with parapneumonic effusion, lactid acid of 2.5, WBC of 20K, K of 2.9, and Na of 129. Her LFTs were slightly elevated but not a significant change from values one week ago. Her UA did not show evidence of infection. She was cultured, and Van/Zosyn was started. She also had oral candidiasis and has been treated with antifungal meds. EKG showed sinus tachycardia. Hospitalist was asked to admit her for syncope, weakness, dehydration, PNA and possible sepsis.   Clinical Impression  Pt was seen for evaluation and found to be very alert and cooperative.  She had earlier been quite nauseated but currently has no c/o.  Vital signs were stable except for elevated HR and for this reason we significantly limited her mobility.  Her strength on MMT is WNL and she was able to transfer OOB to [facility] with just close supervision.  She had no instability however her HR increased to 145.  We stopped PT at this point.  Because we could not progress her to gait it is not possible to do a discharge plan.  I anticipate that she will show mild deconditioning and may benefit from the use of a rolling walker, but otherwise should be able to discharge to home.  We will wait and see how she does before determining the need for HHPT.  Pt is in agreement of this plan.    Follow Up Recommendations  (to be determined)    Equipment  Recommendations   (to be determined)    Recommendations for Other Services       Precautions / Restrictions Precautions Precautions: Fall Restrictions Weight Bearing Restrictions: No      Mobility  Bed Mobility Overal bed mobility: Modified Independent                Transfers Overall transfer level: Needs assistance Equipment used: None Transfers: Sit to/from Stand;Stand Pivot Transfers Sit to Stand: Supervision Stand pivot transfers: Supervision       General transfer comment: her HR had been 115-120 at rest but with stand pivot transfer it elevated to 145.Marland KitchenMarland Kitchenfor this reason we did not attempt any gait  Ambulation/Gait     Unable to test due to tachycardia                                Balance Overall balance assessment: No apparent balance deficits (not formally assessed)                                           Pertinent Vitals/Pain Pain Assessment: No/denies pain    Home Living Family/patient expects to be discharged to:: Private residence Living Arrangements: Spouse/significant other;Children Available Help at Discharge: Family;Available 24 hours/day Type of Home: House Home Access: Stairs to enter   [employer] of Steps: 2 Home  Layout: One level Home Equipment: None      Prior Function Level of Independence: Independent               Hand Dominance        Extremity/Trunk Assessment   Upper Extremity Assessment: Overall WFL for tasks assessed           Lower Extremity Assessment: Overall WFL for tasks assessed      Cervical / Trunk Assessment: Normal  Communication   Communication: No difficulties  Cognition Arousal/Alertness: Awake/alert Behavior During Therapy: WFL for tasks assessed/performed Overall Cognitive Status: Within Functional Limits for tasks assessed                                    Assessment/Plan    PT Assessment Patient needs continued PT  services  PT Diagnosis Generalized weakness   PT Problem List Decreased activity tolerance;Decreased mobility;Decreased knowledge of use of DME;Cardiopulmonary status limiting activity  PT Treatment Interventions Gait training;Functional mobility training;Therapeutic exercise   PT Goals (Current goals can be found in the Care Plan section) Acute Rehab PT Goals Patient Stated Goal: regain independence at home PT Goal Formulation: With patient/family Time For Goal Achievement: 02/26/15 Potential to Achieve Goals: Good    Frequency Min 3X/week   Barriers to discharge   none    Co-evaluation               End of Session Equipment Utilized During Treatment: Gait belt Activity Tolerance: Treatment limited secondary to medical complications (Comment) (tachycardia) Patient left: with call bell/phone within reach;with family/visitor present (on [facility]) Nurse Communication: Mobility status (pt wanted to have a bath while seated on the [facility])         Time: 1122-1208 PT Time Calculation (min) (ACUTE ONLY): 46 min   Charges:   PT Evaluation $Initial PT Evaluation Tier I: 1 Procedure     PT G CodesDemetrios Isaacs L  PT 02/12/2015, 12:27 PM [phone]

## 2015-02-12 NOTE — Progress Notes (Signed)
Pt's critical potassium of 2.7 reported to Dr.Le. Awaiting instructions.

## 2015-02-12 NOTE — Progress Notes (Signed)
PT in to work with pt. Got pt up to [facility]. HR went from 120's to 140's. Pt on BSC commode approx 5 mins while RN and NT changed linens and got pt washed up for the day. Pt became light-headed. RN and NT got pt back to bed. HR now in 110's, RR 16. Pt no longer light-headed, states she's "OK now." will continue to monitor.

## 2015-02-13 ENCOUNTER — Inpatient Hospital Stay (HOSPITAL_COMMUNITY): Payer: BLUE CROSS/BLUE SHIELD

## 2015-02-13 DIAGNOSIS — E119 Type 2 diabetes mellitus without complications: Secondary | ICD-10-CM

## 2015-02-13 DIAGNOSIS — A419 Sepsis, unspecified organism: Principal | ICD-10-CM

## 2015-02-13 DIAGNOSIS — R0600 Dyspnea, unspecified: Secondary | ICD-10-CM

## 2015-02-13 DIAGNOSIS — C799 Secondary malignant neoplasm of unspecified site: Secondary | ICD-10-CM

## 2015-02-13 DIAGNOSIS — E860 Dehydration: Secondary | ICD-10-CM

## 2015-02-13 LAB — GLUCOSE, CAPILLARY
GLUCOSE-CAPILLARY: 237 mg/dL — AB (ref 65–99)
GLUCOSE-CAPILLARY: 283 mg/dL — AB (ref 65–99)
GLUCOSE-CAPILLARY: 250 mg/dL — AB (ref 65–99)
Glucose-Capillary: 270 mg/dL — ABNORMAL HIGH (ref 65–99)

## 2015-02-13 LAB — BASIC METABOLIC PANEL
ANION GAP: 8 (ref 5–15)
BUN: 7 mg/dL (ref 6–20)
CALCIUM: 7.4 mg/dL — AB (ref 8.9–10.3)
CHLORIDE: 97 mmol/L — AB (ref 101–111)
CO2: 31 mmol/L (ref 22–32)
CREATININE: 0.98 mg/dL (ref 0.44–1.00)
Glucose, Bld: 180 mg/dL — ABNORMAL HIGH (ref 65–99)
Potassium: 3.1 mmol/L — ABNORMAL LOW (ref 3.5–5.1)
Sodium: 136 mmol/L (ref 135–145)

## 2015-02-13 LAB — CBC
HCT: 31.7 % — ABNORMAL LOW (ref 36.0–46.0)
Hemoglobin: 10.8 g/dL — ABNORMAL LOW (ref 12.0–15.0)
MCH: 26.9 pg (ref 26.0–34.0)
MCHC: 34.1 g/dL (ref 30.0–36.0)
MCV: 79.1 fL (ref 78.0–100.0)
PLATELETS: 154 10*3/uL (ref 150–400)
RBC: 4.01 MIL/uL (ref 3.87–5.11)
RDW: 16.3 % — AB (ref 11.5–15.5)
WBC: 2 10*3/uL — ABNORMAL LOW (ref 4.0–10.5)

## 2015-02-13 LAB — MAGNESIUM: MAGNESIUM: 1.7 mg/dL (ref 1.7–2.4)

## 2015-02-13 LAB — CORTISOL: Cortisol, Plasma: 28.5 ug/dL

## 2015-02-13 LAB — HEMOGLOBIN A1C
HEMOGLOBIN A1C: 9.9 % — AB (ref 4.8–5.6)
MEAN PLASMA GLUCOSE: 237 mg/dL

## 2015-02-13 MED ORDER — POTASSIUM CHLORIDE CRYS ER 20 MEQ PO TBCR
40.0000 meq | EXTENDED_RELEASE_TABLET | ORAL | Status: AC
  Administered 2015-02-13: 40 meq via ORAL
  Filled 2015-02-13 (×2): qty 2

## 2015-02-13 MED ORDER — DEXAMETHASONE SODIUM PHOSPHATE 4 MG/ML IJ SOLN
4.0000 mg | Freq: Two times a day (BID) | INTRAMUSCULAR | Status: DC
  Administered 2015-02-13 – 2015-02-15 (×5): 4 mg via INTRAVENOUS
  Filled 2015-02-13 (×5): qty 1

## 2015-02-13 MED ORDER — POTASSIUM CHLORIDE CRYS ER 20 MEQ PO TBCR
40.0000 meq | EXTENDED_RELEASE_TABLET | Freq: Once | ORAL | Status: AC
  Administered 2015-02-13: 40 meq via ORAL
  Filled 2015-02-13: qty 2

## 2015-02-13 MED ORDER — MAGNESIUM SULFATE 2 GM/50ML IV SOLN
2.0000 g | Freq: Once | INTRAVENOUS | Status: AC
  Administered 2015-02-13: 2 g via INTRAVENOUS
  Filled 2015-02-13: qty 50

## 2015-02-13 MED ORDER — NYSTATIN 100000 UNIT/ML MT SUSP: 5.0000 mL | Freq: Four times a day (QID) | OROMUCOSAL | Status: DC

## 2015-02-13 MED ORDER — IOHEXOL 350 MG/ML SOLN
100.0000 mL | Freq: Once | INTRAVENOUS | Status: AC | PRN
  Administered 2015-02-13: 100 mL via INTRAVENOUS

## 2015-02-13 MED ORDER — MAGIC MOUTHWASH
15.0000 mL | Freq: Four times a day (QID) | ORAL | Status: DC | PRN
  Administered 2015-02-13 – 2015-02-15 (×3): 15 mL via ORAL
  Filled 2015-02-13 (×4): qty 15

## 2015-02-13 NOTE — Progress Notes (Signed)
[facility] PROGRESS NOTE  Brenda Avery QPR:[id number] DOB: 1971/07/10 DOA: 02/10/2015 PCP: Molli Hazard, MD    Code Status: Full code Family Communication: Discussed with husband. Disposition Plan: Discharge when clinically appropriate   Consultants:  Oncology  Palliative care  Procedures:  None  Antibiotics:  Zosyn 6/27>>  Vancomycin 6/27>>  HPI/Subjective: She does not feel short of breath. Continues to have pain around Port-A-Cath site. Reports that this usually occurs once or Port-A-Cath has been accessed for more than 1-2 days. Denies any abdominal pain.  Objective: Filed Vitals:   02/13/15 0829  BP:   Pulse:   Temp: 98.1 F (36.7 C)  Resp:    BP 89/52 HR 121. RR 15. O2 sats 97% on 02.  Intake/Output Summary (Last 24 hours) at 02/13/15 0858 Last data filed at 02/13/15 9935  Gross per 24 hour  Intake 2341.67 ml  Output   1876 ml  Net 465.67 ml   Filed Weights   02/11/15 0500 02/12/15 0500 02/13/15 0500  Weight: 72.6 kg (160 lb 0.9 oz) 73.4 kg (161 lb 13.1 oz) 73.6 kg (162 lb 4.1 oz)    Exam:   General:  Pleasant alert 44 year old African-American woman in no acute distress.  Oropharynx: No obvious white plaques or exudates on the tongue.  Cardiovascular: S1, S2, tachycardic no murmurs rubs or gallops.  Respiratory: Fair air movement bilaterally, lungs are mostly clear bilaterally.  Abdomen: Positive bowel sounds, soft, nontender, nondistended.  Musculoskeletal/extremities: No pedal edema. No acute hot red joints.  Neurologic: She is alert and oriented 3. Cranial nerves II through XII are intact.   Data Reviewed: Basic Metabolic Panel:  Recent Labs Lab 02/11/15 0443 02/11/15 1555 02/12/15 0430 02/12/15 1325 02/13/15 0515  NA 132* 128* 131* 131* 136  K 2.9* 2.9* 2.7* 3.0* 3.1*  CL 94* 92* 93* 93* 97*  CO2 29 28 29 27 31   GLUCOSE 327* 309* 278* 305* 180*  BUN 9 7 7 7 7   CREATININE 0.59 0.54 0.60 0.62 0.98   CALCIUM 6.7* 6.5* 6.9* 7.3* 7.4*  MG 1.5*  --   --   --  1.7   Liver Function Tests:  Recent Labs Lab 02/06/15 1031 02/10/15 1358 02/11/15 0443  AST 69* 75* 57*  ALT 104* 98* 77*  ALKPHOS 257* 253* 192*  BILITOT 1.6* 1.8* 2.0*  PROT 6.0* 5.6* 4.7*  ALBUMIN 2.3* 2.4* 2.0*    Recent Labs Lab 02/10/15 1358  LIPASE 38   No results for input(s): AMMONIA in the last 168 hours. CBC:  Recent Labs Lab 02/06/15 1031 02/10/15 1358 02/10/15 1905 02/11/15 0443 02/12/15 0430 02/13/15 0515  WBC 3.8* 20.4* 17.4* 14.3* 5.8 2.0*  NEUTROABS 2.4 19.4*  --   --   --   --   HGB 13.2 13.8 11.8* 11.1* 10.8* 10.8*  HCT 38.1 40.0 34.5* 32.8* 32.4* 31.7*  MCV 78.7 79.5 79.3 79.2 79.0 79.1  PLT 233 202 185 168 173 154   Cardiac Enzymes: No results for input(s): CKTOTAL, CKMB, CKMBINDEX, TROPONINI in the last 168 hours. BNP (last 3 results) No results for input(s): BNP in the last 8760 hours.  ProBNP (last 3 results) No results for input(s): PROBNP in the last 8760 hours.  CBG:  Recent Labs Lab 02/12/15 0749 02/12/15 1127 02/12/15 1641 02/12/15 2109 02/13/15 0729  GLUCAP 319* 354* 202* 139* 237*    Recent Results (from the past 240 hour(s))  Culture, blood (routine x 2)     Status: None (Preliminary result)  Collection Time: 02/10/15  3:08 PM  Result Value Ref Range Status   Specimen Description BLOOD RIGHT ARM  Final   Special Requests BOTTLES DRAWN AEROBIC ONLY [place]  Final   Culture NO GROWTH 2 DAYS  Final   Report Status PENDING  Incomplete  Culture, blood (routine x 2)     Status: None (Preliminary result)   Collection Time: 02/10/15  3:16 PM  Result Value Ref Range Status   Specimen Description BLOOD RIGHT HAND  Final   Special Requests BOTTLES DRAWN AEROBIC ONLY [place]  Final   Culture NO GROWTH 2 DAYS  Final   Report Status PENDING  Incomplete  Urine culture     Status: None   Collection Time: 02/10/15  3:30 PM  Result Value Ref Range Status   Specimen  Description URINE, CLEAN CATCH  Final   Special Requests NONE  Final   Culture   Final    MULTIPLE SPECIES PRESENT, SUGGEST RECOLLECTION IF CLINICALLY INDICATED Performed at [facility]    Report Status 02/12/2015 FINAL  Final  MRSA PCR Screening     Status: None   Collection Time: 02/10/15  6:10 PM  Result Value Ref Range Status   MRSA by PCR NEGATIVE NEGATIVE Final    Comment:        The GeneXpert MRSA Assay (FDA approved for NASAL specimens only), is one component of a comprehensive MRSA colonization surveillance program. It is not intended to diagnose MRSA infection nor to guide or monitor treatment for MRSA infections.      Studies: Dg Chest Port 1 View  02/12/2015   CLINICAL DATA:  Right pleural effusion.  Shortness of breath.  EXAM: PORTABLE CHEST - 1 VIEW  COMPARISON:  02/10/2015  FINDINGS: There is a persistent small right pleural effusion. No change since the prior study. Power port in place. Left lung is clear. Heart size and vascularity are normal.  IMPRESSION: Unchanged right pleural effusion. I suspect that this is a malignant effusion or sympathetic effusions secondary to the underlying liver metastases. I doubt that the patient has an infiltrate but probably has slight compressive atelectasis of the right lower lobe.   Electronically Signed   By: Lorriane Shire M.D.   On: 02/12/2015 19:35    Scheduled Meds: . antiseptic oral rinse  7 mL Mouth Rinse q12n4p  . calcium-vitamin D  2 tablet Oral BID  . chlorhexidine  15 mL Mouth Rinse BID  . dexamethasone  4 mg Intravenous Q12H  . docusate sodium  200 mg Oral QHS  . feeding supplement (GLUCERNA SHAKE)  237 mL Oral TID BM  . feeding supplement (PRO-STAT 64)  30 mL Oral TID WC  . fentaNYL  12.5 mcg Transdermal Q72H  . fluconazole  50 mg Oral Daily  . insulin aspart  0-20 Units Subcutaneous TID WC  . insulin aspart  0-5 Units Subcutaneous QHS  . insulin aspart  8 Units Subcutaneous TID WC  . insulin detemir   20 Units Subcutaneous BID  . magnesium sulfate 1 - 4 g bolus IVPB  2 g Intravenous Once  . ondansetron  4 mg Intravenous Once  . pantoprazole  40 mg Oral Daily  . piperacillin-tazobactam (ZOSYN)  IV  3.375 g Intravenous 3 times per day  . potassium chloride  10 mEq Oral BID  . potassium chloride  40 mEq Oral Q3H  . sodium chloride  3 mL Intravenous Q12H  . vancomycin  750 mg Intravenous Q8H   Continuous Infusions: .  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 Sepsis, secondary to HCAP; parapneumonic effusion. On admission, the patient was afebrile, but her white blood cell count was 20.4. She was mildly tachycardic and her blood pressure was low-normal. Her lactic acid level was 2.7. Her chest x-ray revealed new right effusion and lower lobe consolidation/pneumonia. Blood cultures were ordered and she was started on Zosyn and vancomycin and IV fluids.  -Blood cultures are negative to date. -She remains afebrile. Her white blood cell count has improved. Her blood pressure has has been intermittently low and she continues to be tachycardic despite vigorous IV fluids. -She's continued on IV fluids. - will check serum cortisol. Patient was taking decadron as an outpatient. Will restart on IV decadron at stress dosing for now.   Syncope and collapse. She had a syncopal episode following a bowel movement at home. The etiology could have been vasovagal, but in the setting of sepsis, dehydration/hypovolemia, and electrolyte derangements, the etiology is likely multifactorial. She is currently neurologically intact. Her  TSH was within normal limits. - We'll continue hydration and treatment of sepsis/pneumonia.  Hypokalemia. -continue supplementation of potassium. Will also replace magnesium  Hyponatremia. The patient's serum sodium was 129 on admission. This is likely from hypovolemia/dehydration. She was started on normal saline with improvement in her serum sodium. Continue IV fluids.  Type 2 diabetes mellitus. The patient had been treated with diet alone. Her blood glucose has been uncontrolled, which could be attributed to the dextrose in the IV fluids and steroid. Dextrose was discontinued in IV fluids. She will be on stress dose steroids. Continue to titrate insulin Hemoglobin A1c elevated at 9.9.  Recent oral thrush. She was treated with intravenous diflucan. She is now on oral diflucan per palliative medicine for prophylaxis  Elevated liver transaminases. The patient's LFTs are elevated, likely secondary to metastasis to the liver. We'll continue to monitor.  Breast cancer, with metastasis to multiple sites. The patient is under the care of Dr. Whitney Muse and she is undergoing chemotherapy and is status post radiation therapy. Dr. Whitney Muse was consulted. She ordered a palliative care medicine consult to assist with end-of-life discussion and goals of care. The patient's prognosis is poor, but the situation is complicated given that she has a husband and 2 young children. Patient remains a full code. Appreciate palliative care input. She does not appear to be ready to discuss end of life issues or goals of care at this time.  Chest pain. Etiology is unclear. Echocardiogram has been ordered. Since she remains tachycardic and borderline low blood pressure, will check CT angio of chest to rule out PE.   Right pleural effusion. Felt to be possible parapneumonic. Dr. Caryn Section had reviewed images with Dr. Thornton Papas and recommendations were for thoracentesis. Will do thoracentesis today and send fluid for  analysis.    Time spent: 40 minutes    [facility] Pager [phone]. If 7PM-7AM, please contact night-coverage at [email or web address], password [facility] 02/13/2015, 8:58 AM  LOS: 3 days

## 2015-02-13 NOTE — Consult Note (Signed)
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 She has had radiation and multiple courses of chemotherapy. Brenda Avery reports that up until about a month and a half ago she was active, energetic and living a relatively normal life despite her breast cancer. The last 3-4 weeks have been the worst for her with severe NVD following chemotherapy, poor PO intake and worsening of abdominal pain, chest pain and back spasms.   She was admitted to [facility] on 6/26 with sepsis related to PNA and respiratory failure related to a pulmonary effusion. Today she looks to be improving -her labs look better in terms of decreasing WBC, no fevers, LFTs are better and lactic acid is normal. She however continues to have refractory tachycardia and hypotension despite. She reports very good pain control except for intermittent chest pain that she relates to her porta-cath.  Contacts/Participants in Discussion: Primary Decision Maker: patient HCPOA: no  Husband present, MIL, pastor from her church and her sister were all present for the meeting.  Code Status/Advance Care Planning:  I introduced the topic of advance care planning, code status and beginning the conversation with her family using a "what if" approach to next steps. Will also provide her with additional information to review and check back in on her on this topic after allowing for some reflection time. For now she remains a full  code.  Symptom Management:   Pain: Unusual description of her sternal/chest pain- Im not convinced this is all related to her port- I think working up pericardial disease would be indicated-it appears positional and worse when lying down and comes on quickly "tales her breath away" type pain. She does have cutaneous port sensitivity/pain and tells me she can barely stand to have her port accesses in general. She is on a duragesic patch 12.5 and seems to be tolerating this very well.  Palliative Prophylaxis: Needs and aggressive bowel regimen on chronic opiates.  High risk for thrush- has had recurrent oral thush on many different occasions. Would give prohylaxis  Additional Recommendations (Limitations, Scope, Preferences):  We talked about her illness progression an the impact this has had on her and her family- her sons especially I recommended getting them into [facility] with the local hospice for support during her illness-they can utilize this program even if she is not a hospice patient typically.  Prognostication was extremely difficult-I am concerned that her sarcoidosis may be playing a large complicating role here and also may be making the magnitude of her cancer seem or actually be worse. I am worried about pericardial disease (either from mets or cardiac sarcoid) and I think this needs to be explored urgently.  May be reasonable to obtain both a 2D echo and a chest CT with contrast since she has unusual sternal pain as well as persistent tachycardia.  Need to address her need for steroids-she has been on high doses in the past-she may need stress dosing now and may also be indicated for treatment and control of  sarcoid symptoms- will need careful monitoring of her CBGs and worsening edema if they are resumed. Would start low dose decadron.  Psycho-social/Spiritual:   Support System: Very strong faith base-pastor from her church was present.  Desire for further Chaplaincy  support:yes  Prognosis: Unable to determine  Discharge Planning:  Home with Home Health      Primary Diagnoses  Present on Admission:  . Sepsis . HCAP (healthcare-associated pneumonia) . Dehydration . Hypokalemia . Malnutrition of moderate degree . Carcinoma of left breast metastatic to multiple sites . Syncope and collapse . Hyponatremia  Palliative Review of Systems: As above. I have reviewed the medical record, interviewed the patient and family, and examined the patient. The following aspects are pertinent.  Past Medical History  Diagnosis Date  . Hyperlipidemia     diet therapy per patient request  . 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 Hematuria 2000/2001    evaluted in [place] with negative results  . Breast CA     left  . S/P radiation therapy  05/10/2013-06/23/2013    Left chest wall / 50.4 Gray @ 1.8 Gray per fraction x 28 fractions/ Left Supraclavicular fossa / 45 Gray @1 .8 Gray per fraction x 25 fractions/ Left PAB / 45 Gy at 1.8 Gray per fraction x 25 fractions/Left scar / 10 Gray at [employer] per fraction x 5 fractions     . Status post chemotherapy      Radiation Therapy Radiosensitizing Xeloda  . Status post chemotherapy  09/26/2012 - 11/07/2012      Neoadjuvant chemotherapy consisting of 4 cycles of a.c. From 09/26/2012 through 11/07/2012 she then received 12 weeks of paclitaxel.  . Use of tamoxifen (Nolvadex) started 06/26/13  . Type II diabetes mellitus     diet therapy    History   Social History  . Marital Status: Married    Spouse Name: 2  . Number of Children: N/A  . Years of Education: N/A   Occupational History  . Nursing Assistant at [employer]  . Smoking status: Never Smoker   . Smokeless tobacco: Never Used  . Alcohol Use: No  . Drug Use: No  . Sexual Activity: Not Currently    Birth Control/ Protection: Surgical     Comment: menarche age 13, P40   Other Topics Concern  . None   Social  History Narrative   Family History  Problem Relation Age of Onset  . Hypertension Mother   . Diabetes Mother     MI  . Heart disease Mother   . Hypertension Sister     x4 only one HTN and one thats DI  . Hypertension Brother     x4 only 1 htn & dm  . Heart disease Father   . Breast cancer Maternal Aunt     diagnosed in her 53s  . Cancer Maternal Aunt     unknown  . Breast cancer Paternal Aunt     diagnosed in her 45s  . Cervical cancer Sister 55  . Breast cancer Maternal Aunt     diagnosed in her 43s  . Breast cancer Cousin     3 maternal cousins - 2 in their 79s, 1 in her 47s   Scheduled Meds: . antiseptic oral rinse  7 mL Mouth Rinse q12n4p  . calcium-vitamin D  2 tablet Oral BID  . chlorhexidine  15 mL Mouth Rinse BID  . docusate  sodium  200 mg Oral QHS  . feeding supplement (GLUCERNA SHAKE)  237 mL Oral TID BM  . feeding supplement (PRO-STAT 64)  30 mL Oral TID WC  . fentaNYL  12.5 mcg Transdermal Q72H  . fluconazole  50 mg Oral Daily  . insulin aspart  0-20 Units Subcutaneous TID WC  . insulin aspart  0-5 Units Subcutaneous QHS  . insulin aspart  8 Units Subcutaneous TID WC  . insulin detemir  20 Units Subcutaneous BID  . ondansetron  4 mg Intravenous Once  . pantoprazole  40 mg Oral Daily  . piperacillin-tazobactam (ZOSYN)  IV  3.375 g Intravenous 3 times per day  . potassium chloride  10 mEq Oral BID  . sodium chloride  3 mL Intravenous Q12H  . vancomycin  750 mg Intravenous Q8H   Continuous Infusions: . 0.9 % NaCl with KCl 40 mEq / L 100 mL/hr at 02/13/15 0507   PRN Meds:.ondansetron **OR** ondansetron (ZOFRAN) IV, polyethylene glycol Medications Prior to Admission:  Prior to Admission medications   Medication Sig Start Date End Date Taking? Authorizing Provider  calcium-vitamin D (OSCAL WITH D) 500-200 MG-UNIT per tablet Take 2 tablets by mouth 2 (two) times daily. 01/17/15  Yes Shanker Kristeen Mans, MD  dexamethasone (DECADRON) 4 MG tablet Take 1 tablet (4 mg  total) by mouth 4 (four) times daily. Patient taking differently: Take 4 mg by mouth every other day.  01/21/15  Yes Maryanna Shape, NP  Diphenhyd-Hydrocort-Nystatin (FIRST-DUKES MOUTHWASH) SUSP Swish and swallow 78ml four times a day. 01/28/15  Yes Brenda Avery Ranks, MD  fluconazole (DIFLUCAN) 40 MG/ML suspension Take 2.5 mLs (100 mg total) by mouth daily. Total of 14 days 02/01/15  Yes Brenda Avery Ranks, MD  insulin aspart (NOVOLOG) 100 UNIT/ML FlexPen Use in accordance to provided sliding scale 02/01/15  Yes Brenda Avery Ranks, MD  pantoprazole (PROTONIX) 40 MG tablet Take 1 tablet (40 mg total) by mouth daily. 01/17/15  Yes Shanker Kristeen Mans, MD  Potassium Bicarb-Citric Acid 20 MEQ TBEF Take 1 tablet (20 mEq total) by mouth 2 (two) times daily. 02/04/15  Yes Baird Cancer, PA-C  BAYER CONTOUR TEST test strip  02/01/15   Historical Provider, MD  Eribulin Mesylate (HALAVEN IV) Inject into the vein. Starting June 10: Days 1 & 8 every 21 days 01/25/15   Historical Provider, MD  fentaNYL (DURAGESIC) 12 MCG/HR Place 1 patch (12.5 mcg total) onto the skin every 3 (three) days. 02/04/15   Brenda Avery Ranks, MD  lidocaine-prilocaine (EMLA) cream Apply 1 application topically as needed. 06/22/14   Laurie Panda, NP  ondansetron (ZOFRAN-ODT) 8 MG disintegrating tablet Take 1 tablet (8 mg total) by mouth every 8 (eight) hours as needed for nausea or vomiting. 01/17/15   Shanker Kristeen Mans, MD  oxyCODONE (OXY IR/ROXICODONE) 5 MG immediate release tablet Take 1-2 tabs (5-10mg ) every 6 hours as needed for breatkthrough pain. Patient not taking: Reported on 02/01/2015 01/25/15   Brenda Avery Ranks, MD  oxyCODONE (OXY IR/ROXICODONE) 5 MG immediate release tablet Take 1 tablet (5 mg total) by mouth once. Patient not taking: Reported on 02/10/2015 01/25/15   Brenda Avery Ranks, MD  polyethylene glycol (MIRALAX / GLYCOLAX) packet Take 17 g by mouth daily as needed for mild constipation. 01/17/15   Shanker Kristeen Mans, MD   promethazine (PHENERGAN) 25 MG suppository Place 25 mg rectally every 8 (eight) hours as needed for nausea or vomiting.  01/15/15   Historical Provider, MD  promethazine (  PHENERGAN) 25 MG tablet Take 1 tablet (25 mg total) by mouth every 6 (six) hours as needed for nausea or vomiting. 01/22/15   Brenda Avery Ranks, MD  traMADol (ULTRAM) 50 MG tablet Take 50 mg by mouth every 12 (twelve) hours as needed for moderate pain.     Historical Provider, MD   Allergies  Allergen Reactions  . Metronidazole Hives and Itching  . Sulfonamide Derivatives Hives, Itching and Rash   CBC:    Component Value Date/Time   WBC 2.0* 02/13/2015 0515   WBC 8.7 01/21/2015 1053   HGB 10.8* 02/13/2015 0515   HGB 12.9 01/21/2015 1053   HCT 31.7* 02/13/2015 0515   HCT 39.1 01/21/2015 1053   PLT 154 02/13/2015 0515   PLT 282 01/21/2015 1053   MCV 79.1 02/13/2015 0515   MCV 82.1 01/21/2015 1053   NEUTROABS 19.4* 02/10/2015 1358   NEUTROABS 7.3* 01/21/2015 1053   LYMPHSABS 1.0 02/10/2015 1358   LYMPHSABS 0.8* 01/21/2015 1053   MONOABS 0.0* 02/10/2015 1358   MONOABS 0.5 01/21/2015 1053   EOSABS 0.0 02/10/2015 1358   EOSABS 0.0 01/21/2015 1053   BASOSABS 0.0 02/10/2015 1358   BASOSABS 0.0 01/21/2015 1053   Comprehensive Metabolic Panel:    Component Value Date/Time   NA 136 02/13/2015 0515   NA 134* 01/21/2015 1054   K 3.1* 02/13/2015 0515   K 4.3 01/21/2015 1054   CL 97* 02/13/2015 0515   CO2 31 02/13/2015 0515   CO2 25 01/21/2015 1054   BUN 7 02/13/2015 0515   BUN 15.4 01/21/2015 1054   CREATININE 0.98 02/13/2015 0515   CREATININE 1.1 01/21/2015 1054   GLUCOSE 180* 02/13/2015 0515   GLUCOSE 196* 01/21/2015 1054   CALCIUM 7.4* 02/13/2015 0515   CALCIUM 9.7 01/21/2015 1054   CALCIUM 7.1* 01/16/2015 1430   AST 57* 02/11/2015 0443   AST 151* 01/21/2015 1054   ALT 77* 02/11/2015 0443   ALT 116* 01/21/2015 1054   ALKPHOS 192* 02/11/2015 0443   ALKPHOS 381* 01/21/2015 1054   BILITOT 2.0* 02/11/2015  0443   BILITOT 2.31* 01/21/2015 1054   PROT 4.7* 02/11/2015 0443   PROT 7.0 01/21/2015 1054   ALBUMIN 2.0* 02/11/2015 0443   ALBUMIN 2.4* 01/21/2015 1054    Physical Exam: Vital Signs: BP 98/58 mmHg  Pulse 111  Temp(Src) 97.9 F (36.6 C) (Oral)  Resp 14  Ht 5\' 4"  (1.626 m)  Wt 73.6 kg (162 lb 4.1 oz)  BMI 27.84 kg/m2  SpO2 95% SpO2: SpO2: 95 % O2 Device: O2 Device: Not Delivered O2 Flow Rate:   Intake/output summary:  Intake/Output Summary (Last 24 hours) at 02/13/15 2542 Last data filed at 02/13/15 0507  Gross per 24 hour  Intake 2351.67 ml  Output   1876 ml  Net 475.67 ml   LBM: Last BM Date: 02/10/15 Baseline Weight: Weight: 72.576 kg (160 lb) Most recent weight: Weight: 73.6 kg (162 lb 4.1 oz)  Exam Findings:  Awake, alert - pleasant and smiling Holds chest during conversation Tachycardic +LE edema         Palliative Performance Scale: 60           Additional Data Reviewed: Recent Labs     02/12/15  0430  02/12/15  1325  02/13/15  0515  WBC  5.8   --   2.0*  HGB  10.8*   --   10.8*  PLT  173   --   154  NA  131*  131*  136  BUN  7  7  7   CREATININE  0.60  0.62  0.98     Time In: 3PM  Time Out: 4;30PM Time Total: 90 minutes Greater than 50%  of this time was spent counseling and coordinating care related to the above assessment and plan.  Signed by: Roma Schanz, DO  02/13/2015, 7:12 AM  Please contact Palliative Medicine Team phone at [phone] for questions and concerns.

## 2015-02-13 NOTE — Progress Notes (Signed)
Patient seen during rounding.  Patient's cousin is present in the room.  Patient seen by palliative.  Case discussed with Dr. Roderic Palau.  I personally reviewed and went over radiographic studies with the patient.  The results are noted within this dictation.  Liver lesions have progressed.  She has been on Dexamethasone for weeks, which would calm down sarcoidosis if that was the issue with liver lesion(s).  As a result, sarcoid is less likely.  Liver lesion progression is most likely from breast cancer.  Discussed options with patient, including biopsy of liver lesion.  She would like to discuss this option with her husband before making decision regarding biopsy.  "What are my options if this is breast cancer in my liver?"  We discussed her past treatment.  Her treatment options are limited.  As we progress through lines of treatment, effectiveness declines rapidly.  She denies any pain.  Mild nausea reported; will review antiemetic regimen.  Patient and plan discussed with Dr. Ancil Linsey and she is in agreement with the aforementioned.   Robynn Pane, PA-C 02/13/2015 5:11 PM    CT reviewed. Unfortunately, based upon multiple issues: 1. Liver mets 2. > 2 sites of metastatic disease 3. 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 Recurrent disease < 2 years after completion of initial therapy 5. Current performance status  Her prognosis is extremely poor.  She expressed to me that she will be cured. I have nothing additional to offer. I did offer a repeat liver biopsy for confirmation of progression of her malignancy. She is not sure she wants to proceed. I reviewed the CT imaging in detail. I have expressed my concerns multiple times and tried to discuss end of life issues. She remains full code. Will f/u tomorrow. Donald Pore MD

## 2015-02-14 DIAGNOSIS — E440 Moderate protein-calorie malnutrition: Secondary | ICD-10-CM

## 2015-02-14 DIAGNOSIS — Z515 Encounter for palliative care: Secondary | ICD-10-CM

## 2015-02-14 LAB — GLUCOSE, CAPILLARY
GLUCOSE-CAPILLARY: 262 mg/dL — AB (ref 65–99)
GLUCOSE-CAPILLARY: 291 mg/dL — AB (ref 65–99)
GLUCOSE-CAPILLARY: 227 mg/dL — AB (ref 65–99)
Glucose-Capillary: 176 mg/dL — ABNORMAL HIGH (ref 65–99)

## 2015-02-14 LAB — BASIC METABOLIC PANEL
Anion gap: 9 (ref 5–15)
BUN: 10 mg/dL (ref 6–20)
CALCIUM: 7.7 mg/dL — AB (ref 8.9–10.3)
CO2: 29 mmol/L (ref 22–32)
CREATININE: 1.21 mg/dL — AB (ref 0.44–1.00)
Chloride: 100 mmol/L — ABNORMAL LOW (ref 101–111)
GFR calc Af Amer: >60 mL/min (ref >60)
GFR calc non Af Amer: 53 mL/min — ABNORMAL LOW (ref >60)
Glucose, Bld: 281 mg/dL — ABNORMAL HIGH (ref 65–99)
Potassium: 4.3 mmol/L (ref 3.5–5.1)
SODIUM: 138 mmol/L (ref 135–145)

## 2015-02-14 LAB — CBC
HCT: 30.8 % — ABNORMAL LOW (ref 36.0–46.0)
HEMOGLOBIN: 10.4 g/dL — AB (ref 12.0–15.0)
MCH: 26.8 pg (ref 26.0–34.0)
MCHC: 33.8 g/dL (ref 30.0–36.0)
MCV: 79.4 fL (ref 78.0–100.0)
Platelets: 165 10*3/uL (ref 150–400)
RBC: 3.88 MIL/uL (ref 3.87–5.11)
RDW: 16.8 % — ABNORMAL HIGH (ref 11.5–15.5)
WBC: 2.8 10*3/uL — AB (ref 4.0–10.5)

## 2015-02-14 LAB — VANCOMYCIN, TROUGH: Vancomycin Tr: 36 ug/mL (ref 10.0–20.0)

## 2015-02-14 MED ORDER — LEVOFLOXACIN IN D5W 750 MG/150ML IV SOLN
750.0000 mg | INTRAVENOUS | Status: DC
  Administered 2015-02-14: 750 mg via INTRAVENOUS
  Filled 2015-02-14: qty 150

## 2015-02-14 NOTE — Progress Notes (Signed)
[facility] PROGRESS NOTE  TEIGEN PARSLOW OEU:[id number] DOB: [birth year] DOA: 02/10/2015 PCP: Molli Hazard, MD    Code Status: Full code Family Communication: Discussed with patient, no family present today Disposition Plan: Discharge when clinically appropriate   Consultants:  Oncology  Palliative care  Procedures:  None  Antibiotics:  Zosyn 6/27>>6/30  Vancomycin 6/27>>6/30  Levaquin 6/30>>  HPI/Subjective: Overall she is feeling better. She reports breathing has improved and she has not had significant cough. She still feels generally weak, but was happy to work with physical therapy. No nausea or vomiting.  Objective: Filed Vitals:   02/14/15 1000  BP: 105/63  Pulse: 101  Temp:   Resp: 15     Intake/Output Summary (Last 24 hours) at 02/14/15 1031 Last data filed at 02/14/15 1000  Gross per 24 hour  Intake 3778.33 ml  Output   1800 ml  Net 1978.33 ml   Filed Weights   02/12/15 0500 02/13/15 0500 02/14/15 0500  Weight: 73.4 kg (161 lb 13.1 oz) 73.6 kg (162 lb 4.1 oz) 73.9 kg (162 lb 14.7 oz)    Exam:   General:  Pleasant alert 44 year old African-American woman in no acute distress. Sitting up in chair  Cardiovascular: S1, S2, tachycardic regular.  Respiratory: clear bilaterally.  Abdomen: Positive bowel sounds, soft, nontender, nondistended.  Musculoskeletal/extremities: No pedal edema. No acute hot red joints.  Neurologic: She is alert and oriented 3. Cranial nerves II through XII are intact.   Data Reviewed: Basic Metabolic Panel:  Recent Labs Lab 02/11/15 0443 02/11/15 1555 02/12/15 0430 02/12/15 1325 02/13/15 0515 02/14/15 0515  NA 132* 128* 131* 131* 136 138  K 2.9* 2.9* 2.7* 3.0* 3.1* 4.3  CL 94* 92* 93* 93* 97* 100*  CO2 29 28 29 27 31 29   GLUCOSE 327* 309* 278* 305* 180* 281*  BUN 9 7 7 7 7 10   CREATININE 0.59 0.54 0.60 0.62 0.98 1.21*  CALCIUM 6.7* 6.5* 6.9* 7.3* 7.4* 7.7*  MG 1.5*  --   --   --  1.7   --    Liver Function Tests:  Recent Labs Lab 02/10/15 1358 02/11/15 0443  AST 75* 57*  ALT 98* 77*  ALKPHOS 253* 192*  BILITOT 1.8* 2.0*  PROT 5.6* 4.7*  ALBUMIN 2.4* 2.0*    Recent Labs Lab 02/10/15 1358  LIPASE 38   No results for input(s): AMMONIA in the last 168 hours. CBC:  Recent Labs Lab 02/10/15 1358 02/10/15 1905 02/11/15 0443 02/12/15 0430 02/13/15 0515 02/14/15 0515  WBC 20.4* 17.4* 14.3* 5.8 2.0* 2.8*  NEUTROABS 19.4*  --   --   --   --   --   HGB 13.8 11.8* 11.1* 10.8* 10.8* 10.4*  HCT 40.0 34.5* 32.8* 32.4* 31.7* 30.8*  MCV 79.5 79.3 79.2 79.0 79.1 79.4  PLT 202 185 168 173 154 165   Cardiac Enzymes: No results for input(s): CKTOTAL, CKMB, CKMBINDEX, TROPONINI in the last 168 hours. BNP (last 3 results) No results for input(s): BNP in the last 8760 hours.  ProBNP (last 3 results) No results for input(s): PROBNP in the last 8760 hours.  CBG:  Recent Labs Lab 02/13/15 0729 02/13/15 1125 02/13/15 1618 02/13/15 2122 02/14/15 0744  GLUCAP 237* 283* 250* 270* 262*    Recent Results (from the past 240 hour(s))  Culture, blood (routine x 2)     Status: None (Preliminary result)   Collection Time: 02/10/15  3:08 PM  Result Value Ref Range Status   Specimen  Description BLOOD RIGHT ARM  Final   Special Requests BOTTLES DRAWN AEROBIC ONLY [place]  Final   Culture NO GROWTH 4 DAYS  Final   Report Status PENDING  Incomplete  Culture, blood (routine x 2)     Status: None (Preliminary result)   Collection Time: 02/10/15  3:16 PM  Result Value Ref Range Status   Specimen Description BLOOD RIGHT HAND  Final   Special Requests BOTTLES DRAWN AEROBIC ONLY [place]  Final   Culture NO GROWTH 4 DAYS  Final   Report Status PENDING  Incomplete  Urine culture     Status: None   Collection Time: 02/10/15  3:30 PM  Result Value Ref Range Status   Specimen Description URINE, CLEAN CATCH  Final   Special Requests NONE  Final   Culture   Final    MULTIPLE SPECIES  PRESENT, SUGGEST RECOLLECTION IF CLINICALLY INDICATED Performed at [facility]    Report Status 02/12/2015 FINAL  Final  MRSA PCR Screening     Status: None   Collection Time: 02/10/15  6:10 PM  Result Value Ref Range Status   MRSA by PCR NEGATIVE NEGATIVE Final    Comment:        The GeneXpert MRSA Assay (FDA approved for NASAL specimens only), is one component of a comprehensive MRSA colonization surveillance program. It is not intended to diagnose MRSA infection nor to guide or monitor treatment for MRSA infections.      Studies: Ct Angio Chest Pe W/cm &/or Wo Cm  02/13/2015   CLINICAL DATA:  Chest pain, tachycardia, thoracentesis today. Bilateral leg swelling and history of breast cancer.  EXAM: CT ANGIOGRAPHY CHEST WITH CONTRAST  TECHNIQUE: Multidetector CT imaging of the chest was performed using the standard protocol during bolus administration of intravenous contrast. Multiplanar CT image reconstructions and MIPs were obtained to evaluate the vascular anatomy.  CONTRAST:  157mL OMNIPAQUE IOHEXOL 350 MG/ML SOLN  COMPARISON:  12/03/2014 and PET 09/03/2014.  FINDINGS: Mediastinum/Nodes: Negative for pulmonary embolus. Right IJ Port-A-Cath terminates in the right atrium. Mediastinal lymph nodes measure up to 9 mm in the prevascular space (previously 7 mm). No hilar, axillary or internal mammary lymph nodes. Bilateral axillary surgical clips. Heart size normal. Small amount of anterior pericardial fluid is new.  Lungs/Pleura: Unit small right pleural effusion with compressive atelectasis in the right lower lobe. Subpleural densities in the anterior left upper lobe may be due to radiation therapy, stable. Airway is unremarkable.  Upper abdomen: Liver is nearly completely replaced with low-attenuation lesions, progressive from 12/03/2014. Index lesion in the left hepatic lobe measures approximately 3.1 cm (series 4, image 84), previously 2.2 cm. Visualized portions of the adrenal  glands and left kidney are unremarkable. Spleen is heterogeneous and somewhat nodular in appearance, as before. Visualized portions of the pancreas and stomach are grossly unremarkable. Upper abdominal lymph nodes measure up to approximately 12 mm.  Musculoskeletal: Mixed lytic and sclerotic lesion in the manubrium is again noted. Sclerosis in the sternum is also noted.  Review of the MIP images confirms the above findings.  IMPRESSION: 1. Negative for pulmonary embolus. 2. Mediastinal lymph nodes appear slightly larger. 3. Progressive hepatic metastatic disease. 4. Small amount of anterior pericardial fluid is new. 5. Small right pleural effusion with compressive atelectasis in the right lower lobe. 6. Osseous metastatic disease is grossly stable.   Electronically Signed   By: Lorin Picket M.D.   On: 02/13/2015 15:19   [place] Chest  02/13/2015  CLINICAL DATA:  RIGHT pleural effusion, for thoracentesis  EXAM: CHEST ULTRASOUND  COMPARISON:  Chest radiograph 02/12/2015  FINDINGS: Sonography of the RIGHT chest performed.  Only a small RIGHT pleural effusion is identified, insufficient for expected therapeutic benefit.  Thoracentesis not performed.  IMPRESSION: Only a small RIGHT pleural effusion is sonographically visualized, insufficient for expected therapeutic benefit from thoracentesis.   Electronically Signed   By: Lavonia Dana M.D.   On: 02/13/2015 13:34   Dg Chest Port 1 View  02/12/2015   CLINICAL DATA:  Right pleural effusion.  Shortness of breath.  EXAM: PORTABLE CHEST - 1 VIEW  COMPARISON:  02/10/2015  FINDINGS: There is a persistent small right pleural effusion. No change since the prior study. Power port in place. Left lung is clear. Heart size and vascularity are normal.  IMPRESSION: Unchanged right pleural effusion. I suspect that this is a malignant effusion or sympathetic effusions secondary to the underlying liver metastases. I doubt that the patient has an infiltrate but probably has slight  compressive atelectasis of the right lower lobe.   Electronically Signed   By: Lorriane Shire M.D.   On: 02/12/2015 19:35    Scheduled Meds: . calcium-vitamin D  2 tablet Oral BID  . dexamethasone  4 mg Intravenous Q12H  . docusate sodium  200 mg Oral QHS  . fentaNYL  12.5 mcg Transdermal Q72H  . fluconazole  50 mg Oral Daily  . insulin aspart  0-20 Units Subcutaneous TID WC  . insulin aspart  0-5 Units Subcutaneous QHS  . insulin aspart  8 Units Subcutaneous TID WC  . insulin detemir  20 Units Subcutaneous BID  . ondansetron  4 mg Intravenous Once  . pantoprazole  40 mg Oral Daily  . piperacillin-tazobactam (ZOSYN)  IV  3.375 g Intravenous 3 times per day  . potassium chloride  10 mEq Oral BID  . sodium chloride  3 mL Intravenous Q12H   Continuous Infusions: . 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 Sepsis, secondary to HCAP; parapneumonic effusion. On admission, the patient was afebrile, but her white blood cell count was 20.4. She was mildly tachycardic and her blood pressure was low-normal. Her lactic acid level was 2.7. Her chest x-ray revealed new right effusion and lower lobe consolidation/pneumonia. Blood cultures were ordered and she was started on Zosyn and vancomycin and IV fluids.  -Blood cultures are negative to date. -She remains afebrile. Her white blood cell count has improved. Her blood pressure has has been intermittently low and she continues to be tachycardic despite vigorous IV fluids. -She's continued on IV fluids. - She is currently on stress dose Decadron. We'll start to wean  steroids.  - De-escalate antibiotics to Levaquin  Syncope and collapse. She had a syncopal episode following a bowel movement at home. The etiology could have been vasovagal, but in the setting of sepsis, dehydration/hypovolemia, and electrolyte derangements, the etiology is likely multifactorial. She is currently neurologically intact. Her TSH was within normal limits. - We'll continue hydration and treatment of sepsis/pneumonia.  Hypokalemia. -continue supplementation of potassium. Will also  replace magnesium  Hyponatremia. The patient's serum sodium was 129 on admission. This is likely from hypovolemia/dehydration. She was started on normal saline with improvement in her serum sodium. Continue IV fluids.  Type 2 diabetes mellitus. The patient had been treated with diet alone. Her blood glucose has been uncontrolled, which could be attributed to the dextrose in the IV fluids and steroid. Dextrose was discontinued in IV fluids. She will be on stress dose steroids. Continue to titrate insulin Hemoglobin A1c elevated at 9.9.  Recent oral thrush. She was treated with intravenous diflucan. She is now on oral diflucan per palliative medicine for prophylaxis  Elevated liver transaminases. The patient's LFTs are elevated, likely secondary to metastasis to the liver. We'll continue to monitor.  Breast cancer, with metastasis to multiple sites. The patient is under the care of Dr. Whitney Muse and she is undergoing chemotherapy and is status post radiation therapy. Dr. Whitney Muse was consulted. She ordered a palliative care medicine consult to assist with end-of-life discussion and goals of care. The patient's prognosis is poor, but the situation is complicated given that she has a husband and 2 young children. Patient remains a full code. Appreciate palliative care input. She does not appear to be ready to discuss end of life issues or goals of care at this time. She is agreeable to discuss hospice  support services in the community  Chest pain. Etiology is unclear. Echocardiogram shows normal ejection fraction without any significant abnormalities. Since she remains tachycardic and borderline low blood pressure. CT chest negative for pulmonary embolus. Continue IV fluids  Right pleural effusion. Felt to be possible parapneumonic. Patient does not have significant fluid for aspiration. Continue to monitor    Time spent: 40 minutes    [facility] Pager [phone]. If 7PM-7AM, please contact night-coverage at [email or web address], password [facility] 02/14/2015, 10:31 AM  LOS: 4 days

## 2015-02-14 NOTE — Progress Notes (Signed)
Inpatient Diabetes Program Recommendations  AACE/ADA: New Consensus Statement on Inpatient Glycemic Control (2013)  Target Ranges:  Prepandial:   less than 140 mg/dL      Peak postprandial:   less than 180 mg/dL (1-2 hours)      Critically ill patients:  140 - 180 mg/dL   Results for Brenda Avery, Brenda Avery (MRN [id number]) as of 02/14/2015 09:06  Ref. Range 02/13/2015 07:29 02/13/2015 11:25 02/13/2015 16:18 02/13/2015 21:22 02/14/2015 07:44  Glucose-Capillary Latest Ref Range: 65-99 mg/dL 237 (H) 283 (H) 250 (H) 270 (H) 262 (H)   Diabetes history: DM2 Outpatient Diabetes medications: Novolog correction scale Current orders for Inpatient glycemic control: Levemir 20 units BID, Novolog 0-20 units TID with meals, Novolog 0-5 units HS, Novolog 8 units TID with meals  Inpatient Diabetes Program Recommendations Insulin - Basal: If steroids are continued as ordered please consider increasing Levemir to 25 units BID. Insulin - Meal Coverage: If steroids are continued as ordered please consider increasing meal coverage to Novolog 10 units TID with meals.  Thanks, Barnie Alderman, RN, MSN, CCRN, CDE Diabetes Coordinator Inpatient Diabetes Program [phone] (Team Pager from [place] to [place]) [phone] ([place] office) [phone] [facility] office) [phone] [facility] [place] office)

## 2015-02-14 NOTE — Progress Notes (Signed)
Follow up visit offering emotional and spiritual support. Patient was very expressive about how her faith is sustaining her. She stated she has heard and understands what is shared by her clinical caregivers about what her tests are revealing about her physical health. We talked about how she is holding both what she feels her faith's message is saying about her healing and the facts of what the physicians are revealing to her. She is very strong in her beliefs that she is being healed. We talked about what that means for this day and as she holds both faith and facts moving forward what that may look like. I talked with her about how ideas of healing may change and how she might be open to that.  We talked some about how her children were processing her illness. We did not talk long about them though. She has a very good support system. Will continue to offer support.  I didn't talk with her about her advance directives but hope to get that chance.

## 2015-02-14 NOTE — Progress Notes (Signed)
[place] for Vancomycin & Zosyn Indication: pneumonia  Allergies  Allergen Reactions  . Metronidazole Hives and Itching  . Sulfonamide Derivatives Hives, Itching and Rash    Patient Measurements: Height: 5\' 4"  (162.6 cm) Weight: 162 lb 14.7 oz (73.9 kg) IBW/kg (Calculated) : 54.7  Vital Signs: Temp: 97.7 F (36.5 C) (06/30 0754) Temp Source: Oral (06/30 0400) BP: 105/65 mmHg (06/30 0600) Pulse Rate: 94 (06/30 0600) Intake/Output from previous day: 06/29 0701 - 06/30 0700 In: 3618.3 [P.O.:480; I.V.:2488.3; IV Piggyback:650] Out: 1800 [Urine:1800] Intake/Output from this shift:    Labs:  Recent Labs  02/12/15 0430 02/12/15 1325 02/12/15 1527 02/13/15 0515 02/14/15 0515  WBC 5.8  --   --  2.0* 2.8*  HGB 10.8*  --   --  10.8* 10.4*  PLT 173  --   --  154 165  LABCREA  --   --  48.70  --   --   CREATININE 0.60 0.62  --  0.98 1.21*   Estimated Creatinine Clearance: 57.8 mL/min (by C-G formula based on Cr of 1.21).  Recent Labs  02/14/15 0729  VANCOTROUGH 36*     Microbiology: Recent Results (from the past 720 hour(s))  Culture, blood (routine x 2)     Status: None (Preliminary result)   Collection Time: 02/10/15  3:08 PM  Result Value Ref Range Status   Specimen Description BLOOD RIGHT ARM  Final   Special Requests BOTTLES DRAWN AEROBIC ONLY [place]  Final   Culture NO GROWTH 3 DAYS  Final   Report Status PENDING  Incomplete  Culture, blood (routine x 2)     Status: None (Preliminary result)   Collection Time: 02/10/15  3:16 PM  Result Value Ref Range Status   Specimen Description BLOOD RIGHT HAND  Final   Special Requests BOTTLES DRAWN AEROBIC ONLY [place]  Final   Culture NO GROWTH 3 DAYS  Final   Report Status PENDING  Incomplete  Urine culture     Status: None   Collection Time: 02/10/15  3:30 PM  Result Value Ref Range Status   Specimen Description URINE, CLEAN CATCH  Final   Special Requests NONE  Final   Culture    Final    MULTIPLE SPECIES PRESENT, SUGGEST RECOLLECTION IF CLINICALLY INDICATED Performed at [facility]    Report Status 02/12/2015 FINAL  Final  MRSA PCR Screening     Status: None   Collection Time: 02/10/15  6:10 PM  Result Value Ref Range Status   MRSA by PCR NEGATIVE NEGATIVE Final    Comment:        The GeneXpert MRSA Assay (FDA approved for NASAL specimens only), is one component of a comprehensive MRSA colonization surveillance program. It is not intended to diagnose MRSA infection nor to guide or monitor treatment for MRSA infections.     Anti-infectives    Start     Dose/Rate Route Frequency Ordered Stop   02/12/15 1000  fluconazole (DIFLUCAN) tablet 50 mg     50 mg Oral Daily 02/11/15 1741     02/10/15 2359  vancomycin (VANCOCIN) IVPB 750 mg/150 ml premix  Status:  Discontinued     750 mg 150 mL/hr over 60 Minutes Intravenous Every 8 hours 02/10/15 1921 02/14/15 0822   02/10/15 2200  piperacillin-tazobactam (ZOSYN) IVPB 3.375 g  Status:  Discontinued     3.375 g 100 mL/hr over 30 Minutes Intravenous 3 times per day 02/10/15 1814 02/10/15 1816  02/10/15 2200  piperacillin-tazobactam (ZOSYN) IVPB 3.375 g     3.375 g 12.5 mL/hr over 240 Minutes Intravenous 3 times per day 02/10/15 1818     02/10/15 2030  fluconazole (DIFLUCAN) 40 MG/ML suspension 100 mg  Status:  Discontinued     100 mg Oral Daily 02/10/15 1814 02/11/15 1741   02/10/15 1500  piperacillin-tazobactam (ZOSYN) IVPB 3.375 g  Status:  Discontinued     3.375 g 100 mL/hr over 30 Minutes Intravenous 3 times per day 02/10/15 1450 02/10/15 1818   02/10/15 1500  vancomycin (VANCOCIN) IVPB 1000 mg/200 mL premix     1,000 mg 200 mL/hr over 60 Minutes Intravenous  Once 02/10/15 1453 02/10/15 1719      Assessment: 44 yo F who presented with syncope & generalized weakness.  Remains afebrile, WBC trended down.  Metastatic breast CA noted.   Micro (-) to date. Renal function worsening.  Trough  elevated.  Goal of Therapy:  Vancomycin trough level 15-20 mcg/ml  Plan:  Continue Zosyn 3.375gm IV Q8h to be infused over 4hrs Stop scheduled Vancomycin Random level on 7/1, re-dose if appropriate in AM. Monitor renal function and cx data  Duration of therapy per MD  Pricilla Larsson 02/14/2015,8:28 AM

## 2015-02-14 NOTE — Progress Notes (Signed)
Consultation Note Date: 02/14/2015   Patient Name: Brenda Avery  DOB: [birth year]  MRN: [id number]  Age / Sex: 44 y.o., female   PCP: Patrici Ranks, MD Referring Physician: Kathie Dike, MD  Reason for Consultation: Establishing goals of care and Psychosocial/spiritual support  Palliative Care Assessment and Plan Summary of Established Goals of Care and Medical Treatment Preferences   Clinical Assessment/Narrative: Ms. Curb is resting today after a very busy day yesterday.  She talks about her recent testing and relief that she didn't need thoracentesis.  She is still concerned about her pain at her port a cath, and states she is hopeful that she will be able to d/c access soon. She says her husband is taking today for himself as he heard that she probably didn't have breast cancer in her liver, but now hears that the liver lesions are breast metastatic.   She states she thinks that some providers expect her to break down when they talk with her, but she talks about her faith and her healing, alluding to this healing may not all be physical.   We talk about Hospice benefits for summer camp for her sons, but she wants them to have as little change as possible at this time.  We also discuss involving the counselors at their schools.  [name] states she is concerned that she may have to give up the palliative medicine team, and both she and her husband want to keep this services.  [name] was advised that the PMT will stay with her throughout her journey, supporting her and her family in what is right for them.  When she becomes tired, [name] will start looking at the TV instead of provider as a sign that she no longer wants to talk.   Contacts/Participants in Discussion: Primary Decision Maker: [name] is able to make her own decisions, but leans on her family for support.    HCPOA: no   Code Status/Advance Care Planning:  Full Code at this time  Symptom Management:    Pain: Duragesic 12 mcg, N/V: Zofran 4 mg PO/IV  Palliative Prophylaxis:   Miralax QD   Psycho-social/Spiritual:   Support System: [name] has a supportivie husband and mother in law.  She has many visitors throughout the day.    Desire for further Chaplaincy support:  Continues as ongoing.   Prognosis: Unable to determine  Discharge Planning:  Home with Home Health       Primary Diagnoses  Present on Admission:  . Sepsis . HCAP (healthcare-associated pneumonia) . Dehydration . Hypokalemia . Malnutrition of moderate degree . Carcinoma of left breast metastatic to multiple sites . Syncope and collapse . Hyponatremia  Palliative Review of Systems:  As above.   I have reviewed the medical record, interviewed the patient and family, and examined the patient. The following aspects are pertinent.  Past Medical History  Diagnosis Date  . Hyperlipidemia     diet therapy per patient request  . 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 Hematuria 2000/2001    evaluted in [place] with negative results  . Breast CA     left  . S/P radiation therapy  05/10/2013-06/23/2013    Left chest wall / 50.4 Gray @ 1.8 Gray per fraction x 28 fractions/ Left Supraclavicular fossa / 13 Gray @1 .8 Gray per fraction x 25 fractions/ Left PAB / 45 Gy at 1.8 Gray per fraction x 25 fractions/Left scar /  10 Gray at [employer] per fraction x 5 fractions     . Status post chemotherapy      Radiation Therapy Radiosensitizing Xeloda  . Status post chemotherapy  09/26/2012 - 11/07/2012      Neoadjuvant chemotherapy consisting of 4 cycles of a.c. From 09/26/2012 through 11/07/2012 she then received 12 weeks of paclitaxel.  . Use of tamoxifen (Nolvadex) started 06/26/13  . Type II diabetes mellitus     diet therapy    History   Social History  . Marital Status: Married    Spouse Name: 2  . Number of Children: N/A  . Years of Education: N/A   Occupational History  . 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 Smoking status: Never Smoker   . Smokeless tobacco: Never Used  . Alcohol Use: No  . Drug Use: No  . Sexual Activity: Not Currently    Birth Control/ Protection: Surgical     Comment: menarche age 9, P36   Other Topics Concern  . None   Social History Narrative   Family History  Problem Relation Age of Onset  . Hypertension Mother   . Diabetes Mother     MI  . Heart disease Mother   . Hypertension Sister     x4 only one HTN and one thats DI  . Hypertension Brother     x4 only 1 htn & dm  . Heart disease Father   . Breast cancer Maternal Aunt     diagnosed in her 57s  . Cancer Maternal Aunt     unknown  . Breast cancer Paternal Aunt     diagnosed in her 80s  . Cervical cancer Sister 31  . Breast cancer Maternal Aunt     diagnosed in her 37s  . Breast cancer Cousin     3 maternal cousins - 2 in their 23s, 1 in her 32s   Scheduled Meds: . calcium-vitamin D  2 tablet Oral BID  . dexamethasone  4 mg Intravenous Q12H  . docusate sodium  200 mg Oral QHS  . fentaNYL  12.5 mcg Transdermal Q72H  . fluconazole  50 mg Oral Daily  . insulin aspart  0-20 Units Subcutaneous TID WC  . insulin aspart  0-5 Units Subcutaneous QHS  . insulin aspart  8 Units Subcutaneous TID WC  . insulin detemir  20 Units Subcutaneous BID  . levofloxacin (LEVAQUIN) IV  750 mg Intravenous Q24H  . ondansetron  4 mg Intravenous Once  . pantoprazole  40 mg Oral Daily  . potassium chloride  10 mEq Oral BID  . sodium chloride  3 mL Intravenous Q12H   Continuous Infusions: . 0.9 % NaCl with KCl 40 mEq / L 100 mL/hr at 02/14/15 0600   PRN Meds:.magic mouthwash, ondansetron **OR** ondansetron (ZOFRAN) IV, polyethylene glycol Medications Prior to Admission:  Prior to Admission medications   Medication Sig Start Date End Date Taking? Authorizing Provider  calcium-vitamin D (OSCAL WITH D) 500-200 MG-UNIT per tablet Take 2 tablets by mouth 2 (two) times daily.  01/17/15  Yes Shanker Kristeen Mans, MD  dexamethasone (DECADRON) 4 MG tablet Take 1 tablet (4 mg total) by mouth 4 (four) times daily. Patient taking differently: Take 4 mg by mouth every other day.  01/21/15  Yes Maryanna Shape, NP  Diphenhyd-Hydrocort-Nystatin (FIRST-DUKES MOUTHWASH) SUSP Swish and swallow 68ml four times a day. 01/28/15  Yes Patrici Ranks, MD  fluconazole (DIFLUCAN) 40 MG/ML  suspension Take 2.5 mLs (100 mg total) by mouth daily. Total of 14 days 02/01/15  Yes Patrici Ranks, MD  insulin aspart (NOVOLOG) 100 UNIT/ML FlexPen Use in accordance to provided sliding scale 02/01/15  Yes Patrici Ranks, MD  pantoprazole (PROTONIX) 40 MG tablet Take 1 tablet (40 mg total) by mouth daily. 01/17/15  Yes Shanker Kristeen Mans, MD  Potassium Bicarb-Citric Acid 20 MEQ TBEF Take 1 tablet (20 mEq total) by mouth 2 (two) times daily. 02/04/15  Yes Baird Cancer, PA-C  BAYER CONTOUR TEST test strip  02/01/15   Historical Provider, MD  Eribulin Mesylate (HALAVEN IV) Inject into the vein. Starting June 10: Days 1 & 8 every 21 days 01/25/15   Historical Provider, MD  fentaNYL (DURAGESIC) 12 MCG/HR Place 1 patch (12.5 mcg total) onto the skin every 3 (three) days. 02/04/15   Patrici Ranks, MD  lidocaine-prilocaine (EMLA) cream Apply 1 application topically as needed. 06/22/14   Laurie Panda, NP  ondansetron (ZOFRAN-ODT) 8 MG disintegrating tablet Take 1 tablet (8 mg total) by mouth every 8 (eight) hours as needed for nausea or vomiting. 01/17/15   Shanker Kristeen Mans, MD  oxyCODONE (OXY IR/ROXICODONE) 5 MG immediate release tablet Take 1-2 tabs (5-10mg ) every 6 hours as needed for breatkthrough pain. Patient not taking: Reported on 02/01/2015 01/25/15   Patrici Ranks, MD  oxyCODONE (OXY IR/ROXICODONE) 5 MG immediate release tablet Take 1 tablet (5 mg total) by mouth once. Patient not taking: Reported on 02/10/2015 01/25/15   Patrici Ranks, MD  polyethylene glycol (MIRALAX / GLYCOLAX) packet Take 17  g by mouth daily as needed for mild constipation. 01/17/15   Shanker Kristeen Mans, MD  promethazine (PHENERGAN) 25 MG suppository Place 25 mg rectally every 8 (eight) hours as needed for nausea or vomiting.  01/15/15   Historical Provider, MD  promethazine (PHENERGAN) 25 MG tablet Take 1 tablet (25 mg total) by mouth every 6 (six) hours as needed for nausea or vomiting. 01/22/15   Patrici Ranks, MD  traMADol (ULTRAM) 50 MG tablet Take 50 mg by mouth every 12 (twelve) hours as needed for moderate pain.     Historical Provider, MD   Allergies  Allergen Reactions  . Metronidazole Hives and Itching  . Sulfonamide Derivatives Hives, Itching and Rash   CBC:    Component Value Date/Time   WBC 2.8* 02/14/2015 0515   WBC 8.7 01/21/2015 1053   HGB 10.4* 02/14/2015 0515   HGB 12.9 01/21/2015 1053   HCT 30.8* 02/14/2015 0515   HCT 39.1 01/21/2015 1053   PLT 165 02/14/2015 0515   PLT 282 01/21/2015 1053   MCV 79.4 02/14/2015 0515   MCV 82.1 01/21/2015 1053   NEUTROABS 19.4* 02/10/2015 1358   NEUTROABS 7.3* 01/21/2015 1053   LYMPHSABS 1.0 02/10/2015 1358   LYMPHSABS 0.8* 01/21/2015 1053   MONOABS 0.0* 02/10/2015 1358   MONOABS 0.5 01/21/2015 1053   EOSABS 0.0 02/10/2015 1358   EOSABS 0.0 01/21/2015 1053   BASOSABS 0.0 02/10/2015 1358   BASOSABS 0.0 01/21/2015 1053   Comprehensive Metabolic Panel:    Component Value Date/Time   NA 138 02/14/2015 0515   NA 134* 01/21/2015 1054   K 4.3 02/14/2015 0515   K 4.3 01/21/2015 1054   CL 100* 02/14/2015 0515   CO2 29 02/14/2015 0515   CO2 25 01/21/2015 1054   BUN 10 02/14/2015 0515   BUN 15.4 01/21/2015 1054   CREATININE 1.21* 02/14/2015 0515  CREATININE 1.1 01/21/2015 1054   GLUCOSE 281* 02/14/2015 0515   GLUCOSE 196* 01/21/2015 1054   CALCIUM 7.7* 02/14/2015 0515   CALCIUM 9.7 01/21/2015 1054   CALCIUM 7.1* 01/16/2015 1430   AST 57* 02/11/2015 0443   AST 151* 01/21/2015 1054   ALT 77* 02/11/2015 0443   ALT 116* 01/21/2015 1054    ALKPHOS 192* 02/11/2015 0443   ALKPHOS 381* 01/21/2015 1054   BILITOT 2.0* 02/11/2015 0443   BILITOT 2.31* 01/21/2015 1054   PROT 4.7* 02/11/2015 0443   PROT 7.0 01/21/2015 1054   ALBUMIN 2.0* 02/11/2015 0443   ALBUMIN 2.4* 01/21/2015 1054    Physical Exam: Vital Signs: BP 105/63 mmHg  Pulse 101  Temp(Src) 97.7 F (36.5 C) (Oral)  Resp 15  Ht 5\' 4"  (1.626 m)  Wt 73.9 kg (162 lb 14.7 oz)  BMI 27.95 kg/m2  SpO2 98% SpO2: SpO2: 98 % O2 Device: O2 Device: Not Delivered O2 Flow Rate:   Intake/output summary:  Intake/Output Summary (Last 24 hours) at 02/14/15 1347 Last data filed at 02/14/15 1000  Gross per 24 hour  Intake 3488.33 ml  Output   1300 ml  Net 2188.33 ml   LBM: Last BM Date: 02/10/15 Baseline Weight: Weight: 72.576 kg (160 lb) Most recent weight: Weight: 73.9 kg (162 lb 14.7 oz)  Exam Findings:  GEN: pleasant, lying in bed, smiles easily Resp: regular and non labored         Palliative Performance Scale:  60%              Additional Data Reviewed: Recent Labs     02/13/15  0515  02/14/15  0515  WBC  2.0*  2.8*  HGB  10.8*  10.4*  PLT  154  165  NA  136  138  BUN  7  10  CREATININE  0.98  1.21*     Time In: 1245 Time Out: 1345 Time Total:   60 minutes Greater than 50%  of this time was spent counseling and coordinating care related to the above assessment and plan.  Discussion shared with Nursing staff.  Thank you for consulting the PMT.   Signed by: Drue Novel, NP  Drue Novel, NP  02/14/2015, 1:47 PM  Please contact Palliative Medicine Team phone at [phone] for questions and concerns.

## 2015-02-14 NOTE — Progress Notes (Signed)
Physical Therapy Treatment Patient Details Name: Brenda Avery MRN: [id number] DOB: 06/24/1971 Today's Date: 02/14/2015    History of Present Illness      PT Comments    Pt very pleasant and eager to participate with therapy this morning.  Continued watching pulse rate which increased to 121-130 with transfers to bed side commode, for this reason no gait training complete today.  Diaphragmatic breathing instructed with ability to reduce to 109 BMP with seated exercises.  Pt left in chair with call bell within reach and RN informed.  No reports of pain or discomfort through session.    Follow Up Recommendations        Equipment Recommendations       Recommendations for Other Services       Precautions / Restrictions Precautions Precautions: Fall Restrictions Weight Bearing Restrictions: No    Mobility  Bed Mobility Overal bed mobility: Independent                Transfers Overall transfer level: Modified independent Equipment used: Rolling walker (2 wheeled) Transfers: Sit to/from [facility] Sit to Stand: Supervision Stand pivot transfers: Supervision       General transfer comment: HR at 111 at entrance, increase to 121-130 with transfer  Ambulation/Gait                 Stairs            Wheelchair Mobility    Modified Rankin (Stroke Patients Only)       Balance                                    Cognition Arousal/Alertness: Awake/alert Behavior During Therapy: WFL for tasks assessed/performed Overall Cognitive Status: Within Functional Limits for tasks assessed                      Exercises General Exercises - Lower Extremity Ankle Circles/Pumps: AROM;Both;10 reps;Seated Long Arc Quad: AROM;Both;10 reps Hip ABduction/ADduction: AROM;Both;10 reps;Seated Hip Flexion/Marching: AROM;Both;10 reps;Seated    General Comments        Pertinent Vitals/Pain Pain Assessment: No/denies pain     Home Living                      Prior Function            PT Goals (current goals can now be found in the care plan section) Progress towards PT goals: Progressing toward goals    Frequency       PT Plan Current plan remains appropriate    Co-evaluation             End of Session Equipment Utilized During Treatment: Gait belt Activity Tolerance: Treatment limited secondary to medical complications (Comment) (tachycadia) Patient left: in chair;with call bell/phone within reach     Time: 0840-0910 PT Time Calculation (min) (ACUTE ONLY): 30 min  Charges:  $Therapeutic Exercise: 8-22 mins $Therapeutic Activity: 8-22 mins                    G Codes:      Aldona Lento 02/14/2015, 9:20 AM

## 2015-02-15 ENCOUNTER — Ambulatory Visit (HOSPITAL_COMMUNITY): Payer: BLUE CROSS/BLUE SHIELD | Admitting: Hematology & Oncology

## 2015-02-15 ENCOUNTER — Inpatient Hospital Stay (HOSPITAL_COMMUNITY): Payer: BLUE CROSS/BLUE SHIELD

## 2015-02-15 DIAGNOSIS — E871 Hypo-osmolality and hyponatremia: Secondary | ICD-10-CM

## 2015-02-15 LAB — BASIC METABOLIC PANEL
Anion gap: 8 (ref 5–15)
BUN: 14 mg/dL (ref 6–20)
CALCIUM: 8 mg/dL — AB (ref 8.9–10.3)
CHLORIDE: 101 mmol/L (ref 101–111)
CO2: 30 mmol/L (ref 22–32)
CREATININE: 1.2 mg/dL — AB (ref 0.44–1.00)
GFR calc Af Amer: >60 mL/min (ref >60)
GFR calc non Af Amer: 54 mL/min — ABNORMAL LOW (ref >60)
Glucose, Bld: 264 mg/dL — ABNORMAL HIGH (ref 65–99)
Potassium: 4 mmol/L (ref 3.5–5.1)
Sodium: 139 mmol/L (ref 135–145)

## 2015-02-15 LAB — GLUCOSE, CAPILLARY
Glucose-Capillary: 205 mg/dL — ABNORMAL HIGH (ref 65–99)
Glucose-Capillary: 219 mg/dL — ABNORMAL HIGH (ref 65–99)
Glucose-Capillary: 231 mg/dL — ABNORMAL HIGH (ref 65–99)
Glucose-Capillary: 171 mg/dL — ABNORMAL HIGH (ref 65–99)

## 2015-02-15 LAB — CBC
HCT: 28 % — ABNORMAL LOW (ref 36.0–46.0)
Hemoglobin: 9.6 g/dL — ABNORMAL LOW (ref 12.0–15.0)
MCH: 27.1 pg (ref 26.0–34.0)
MCHC: 34.3 g/dL (ref 30.0–36.0)
MCV: 79.1 fL (ref 78.0–100.0)
Platelets: 143 10*3/uL — ABNORMAL LOW (ref 150–400)
RBC: 3.54 MIL/uL — ABNORMAL LOW (ref 3.87–5.11)
RDW: 17.7 % — ABNORMAL HIGH (ref 11.5–15.5)
WBC: 6.2 10*3/uL (ref 4.0–10.5)

## 2015-02-15 MED ORDER — ZOLPIDEM TARTRATE 5 MG PO TABS
5.0000 mg | ORAL_TABLET | Freq: Every evening | ORAL | Status: DC | PRN
  Administered 2015-02-15: 5 mg via ORAL
  Filled 2015-02-15: qty 1

## 2015-02-15 MED ORDER — LEVOFLOXACIN 750 MG PO TABS
750.0000 mg | ORAL_TABLET | Freq: Every day | ORAL | Status: DC
  Administered 2015-02-15 – 2015-02-16 (×2): 750 mg via ORAL
  Filled 2015-02-15 (×2): qty 1

## 2015-02-15 MED ORDER — DEXAMETHASONE 4 MG PO TABS
4.0000 mg | ORAL_TABLET | Freq: Every day | ORAL | Status: DC
  Administered 2015-02-16: 4 mg via ORAL
  Filled 2015-02-15 (×3): qty 1

## 2015-02-15 NOTE — Progress Notes (Signed)
[facility] PROGRESS NOTE  Brenda Avery OTL:[id number] DOB: [birth year] DOA: 02/10/2015 PCP: Molli Hazard, MD    Code Status: Full code Family Communication: Discussed with patient, no family present today Disposition Plan: Discharge when clinically appropriate   Consultants:  Oncology  Palliative care  Procedures:  None  Antibiotics:  Zosyn 6/27>>6/30  Vancomycin 6/27>>6/30  Levaquin 6/30>>  HPI/Subjective: Overall she is feeling better. She reports breathing has improved and she has not had significant cough. She still feels generally weak, but was happy to work with physical therapy. No nausea or vomiting.  Objective: Filed Vitals:   02/15/15 1000  BP:   Pulse: 132  Temp:   Resp: 19     Intake/Output Summary (Last 24 hours) at 02/15/15 1027 Last data filed at 02/15/15 1000  Gross per 24 hour  Intake   3110 ml  Output   3150 ml  Net    -40 ml   Filed Weights   02/13/15 0500 02/14/15 0500 02/15/15 0500  Weight: 73.6 kg (162 lb 4.1 oz) 73.9 kg (162 lb 14.7 oz) 74.8 kg (164 lb 14.5 oz)    Exam:   General:  Pleasant alert 43 year old African-American woman in no acute distress. Sitting up in chair  Cardiovascular: S1, S2, tachycardic regular.  Respiratory: clear bilaterally.  Abdomen: Positive bowel sounds, soft, nontender, nondistended.  Musculoskeletal/extremities: No pedal edema. No acute hot red joints.  Neurologic: She is alert and oriented 3. Cranial nerves II through XII are intact.   Data Reviewed: Basic Metabolic Panel:  Recent Labs Lab 02/11/15 0443  02/12/15 0430 02/12/15 1325 02/13/15 0515 02/14/15 0515 02/15/15 0530  NA 132*  < > 131* 131* 136 138 139  K 2.9*  < > 2.7* 3.0* 3.1* 4.3 4.0  CL 94*  < > 93* 93* 97* 100* 101  CO2 29  < > 29 27 31 29 30   GLUCOSE 327*  < > 278* 305* 180* 281* 264*  BUN 9  < > 7 7 7 10 14   CREATININE 0.59  < > 0.60 0.62 0.98 1.21* 1.20*  CALCIUM 6.7*  < > 6.9* 7.3* 7.4* 7.7*  8.0*  MG 1.5*  --   --   --  1.7  --   --   < > = values in this interval not displayed. Liver Function Tests:  Recent Labs Lab 02/10/15 1358 02/11/15 0443  AST 75* 57*  ALT 98* 77*  ALKPHOS 253* 192*  BILITOT 1.8* 2.0*  PROT 5.6* 4.7*  ALBUMIN 2.4* 2.0*    Recent Labs Lab 02/10/15 1358  LIPASE 38   No results for input(s): AMMONIA in the last 168 hours. CBC:  Recent Labs Lab 02/10/15 1358  02/11/15 0443 02/12/15 0430 02/13/15 0515 02/14/15 0515 02/15/15 0530  WBC 20.4*  < > 14.3* 5.8 2.0* 2.8* 6.2  NEUTROABS 19.4*  --   --   --   --   --   --   HGB 13.8  < > 11.1* 10.8* 10.8* 10.4* 9.6*  HCT 40.0  < > 32.8* 32.4* 31.7* 30.8* 28.0*  MCV 79.5  < > 79.2 79.0 79.1 79.4 79.1  PLT 202  < > 168 173 154 165 143*  < > = values in this interval not displayed. Cardiac Enzymes: No results for input(s): CKTOTAL, CKMB, CKMBINDEX, TROPONINI in the last 168 hours. BNP (last 3 results) No results for input(s): BNP in the last 8760 hours.  ProBNP (last 3 results) No results for input(s): PROBNP in  the last 8760 hours.  CBG:  Recent Labs Lab 02/14/15 0744 02/14/15 1108 02/14/15 1627 02/14/15 2115 02/15/15 0746  GLUCAP 262* 291* 227* 176* 205*    Recent Results (from the past 240 hour(s))  Culture, blood (routine x 2)     Status: None (Preliminary result)   Collection Time: 02/10/15  3:08 PM  Result Value Ref Range Status   Specimen Description BLOOD RIGHT ARM  Final   Special Requests BOTTLES DRAWN AEROBIC ONLY [place]  Final   Culture NO GROWTH 4 DAYS  Final   Report Status PENDING  Incomplete  Culture, blood (routine x 2)     Status: None (Preliminary result)   Collection Time: 02/10/15  3:16 PM  Result Value Ref Range Status   Specimen Description BLOOD RIGHT HAND  Final   Special Requests BOTTLES DRAWN AEROBIC ONLY [place]  Final   Culture NO GROWTH 4 DAYS  Final   Report Status PENDING  Incomplete  Urine culture     Status: None   Collection Time: 02/10/15  3:30  PM  Result Value Ref Range Status   Specimen Description URINE, CLEAN CATCH  Final   Special Requests NONE  Final   Culture   Final    MULTIPLE SPECIES PRESENT, SUGGEST RECOLLECTION IF CLINICALLY INDICATED Performed at [facility]    Report Status 02/12/2015 FINAL  Final  MRSA PCR Screening     Status: None   Collection Time: 02/10/15  6:10 PM  Result Value Ref Range Status   MRSA by PCR NEGATIVE NEGATIVE Final    Comment:        The GeneXpert MRSA Assay (FDA approved for NASAL specimens only), is one component of a comprehensive MRSA colonization surveillance program. It is not intended to diagnose MRSA infection nor to guide or monitor treatment for MRSA infections.      Studies: Ct Angio Chest Pe W/cm &/or Wo Cm  02/13/2015   CLINICAL DATA:  Chest pain, tachycardia, thoracentesis today. Bilateral leg swelling and history of breast cancer.  EXAM: CT ANGIOGRAPHY CHEST WITH CONTRAST  TECHNIQUE: Multidetector CT imaging of the chest was performed using the standard protocol during bolus administration of intravenous contrast. Multiplanar CT image reconstructions and MIPs were obtained to evaluate the vascular anatomy.  CONTRAST:  131mL OMNIPAQUE IOHEXOL 350 MG/ML SOLN  COMPARISON:  12/03/2014 and PET 09/03/2014.  FINDINGS: Mediastinum/Nodes: Negative for pulmonary embolus. Right IJ Port-A-Cath terminates in the right atrium. Mediastinal lymph nodes measure up to 9 mm in the prevascular space (previously 7 mm). No hilar, axillary or internal mammary lymph nodes. Bilateral axillary surgical clips. Heart size normal. Small amount of anterior pericardial fluid is new.  Lungs/Pleura: Unit small right pleural effusion with compressive atelectasis in the right lower lobe. Subpleural densities in the anterior left upper lobe may be due to radiation therapy, stable. Airway is unremarkable.  Upper abdomen: Liver is nearly completely replaced with low-attenuation lesions, progressive from  12/03/2014. Index lesion in the left hepatic lobe measures approximately 3.1 cm (series 4, image 84), previously 2.2 cm. Visualized portions of the adrenal glands and left kidney are unremarkable. Spleen is heterogeneous and somewhat nodular in appearance, as before. Visualized portions of the pancreas and stomach are grossly unremarkable. Upper abdominal lymph nodes measure up to approximately 12 mm.  Musculoskeletal: Mixed lytic and sclerotic lesion in the manubrium is again noted. Sclerosis in the sternum is also noted.  Review of the MIP images confirms the above findings.  IMPRESSION: 1. Negative  for pulmonary embolus. 2. Mediastinal lymph nodes appear slightly larger. 3. Progressive hepatic metastatic disease. 4. Small amount of anterior pericardial fluid is new. 5. Small right pleural effusion with compressive atelectasis in the right lower lobe. 6. Osseous metastatic disease is grossly stable.   Electronically Signed   By: Lorin Picket M.D.   On: 02/13/2015 15:19   [place] Chest  02/13/2015   CLINICAL DATA:  RIGHT pleural effusion, for thoracentesis  EXAM: CHEST ULTRASOUND  COMPARISON:  Chest radiograph 02/12/2015  FINDINGS: Sonography of the RIGHT chest performed.  Only a small RIGHT pleural effusion is identified, insufficient for expected therapeutic benefit.  Thoracentesis not performed.  IMPRESSION: Only a small RIGHT pleural effusion is sonographically visualized, insufficient for expected therapeutic benefit from thoracentesis.   Electronically Signed   By: Lavonia Dana M.D.   On: 02/13/2015 13:34    Scheduled Meds: . calcium-vitamin D  2 tablet Oral BID  . dexamethasone  4 mg Oral Daily  . docusate sodium  200 mg Oral QHS  . fentaNYL  12.5 mcg Transdermal Q72H  . fluconazole  50 mg Oral Daily  . insulin aspart  0-20 Units Subcutaneous TID WC  . insulin aspart  0-5 Units Subcutaneous QHS  . insulin aspart  8 Units Subcutaneous TID WC  . insulin detemir  20 Units Subcutaneous BID  .  levofloxacin  750 mg Oral Daily  . ondansetron  4 mg Intravenous Once  . pantoprazole  40 mg Oral Daily  . sodium chloride  3 mL Intravenous Q12H   Continuous Infusions: . 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 Sepsis, secondary to HCAP; parapneumonic effusion. On admission, the patient was afebrile, but her white blood cell count was 20.4. She was mildly tachycardic and her blood pressure was low-normal. Her lactic acid level was 2.7. Her chest x-ray revealed new right effusion and lower lobe consolidation/pneumonia. Blood cultures were ordered and she was started on Zosyn and vancomycin and IV fluids.  -Blood cultures are negative to date. -She remains afebrile. Her white blood cell count has improved.  -Blood pressures have improved. We'll discontinue IV fluids. - She is currently on stress dose Decadron. We'll start to wean steroids.  - De-escalate antibiotics to Levaquin  Syncope and collapse. She had a syncopal episode following a bowel movement at home. The etiology could have been vasovagal, but in the setting of sepsis, dehydration/hypovolemia, and electrolyte derangements, the etiology is likely multifactorial. She is currently neurologically intact. Her TSH was within normal limits. - She appears to be adequately hydrated. We'll discontinue IV fluids  Hypokalemia. -continue supplementation of potassium. Will also replace magnesium  Hyponatremia. The patient's serum sodium was 129 on admission. This is likely from hypovolemia/dehydration. She was started on normal  saline with improvement in her serum sodium. Continue IV fluids.  Type 2 diabetes mellitus. The patient had been treated with diet alone. Her blood glucose has been uncontrolled, which could be attributed to the dextrose in the IV fluids and steroid. Dextrose was discontinued in IV fluids. We are weaning  stress dose steroids. Continue to titrate insulin Hemoglobin A1c elevated at 9.9.  Recent oral thrush. She was treated with intravenous diflucan. She is now on oral diflucan per palliative medicine for prophylaxis  Elevated liver transaminases. The patient's LFTs are elevated, likely secondary to metastasis to the liver. We'll continue to monitor.  Breast cancer, with metastasis to multiple sites. The patient is under the care of Dr. Whitney Muse and she is undergoing chemotherapy and is status post radiation therapy. Dr. Whitney Muse was consulted. She ordered a palliative care medicine consult to assist with end-of-life discussion and goals of care. The patient's prognosis is poor, but the situation is complicated given that she has a husband and 2 young children. Patient remains a full code. Appreciate palliative care input. She does not appear to be ready to discuss end of life issues or goals of care at this time. She is agreeable to discuss hospice support services in the community  Chest pain. Etiology is unclear. Echocardiogram shows normal ejection fraction without any significant abnormalities. Since she remains tachycardic and borderline low blood pressure. CT chest negative for pulmonary embolus. Patient feels chest pain is related to her portacath access. CT images were reviewed by Dr. Thornton Papas and it was noted that patient has a patent Port-A-Cath without extravasation of any contrast. It was also noted that patient has a bony metastatic lesion posterior to her Port-A-Cath reservoir which is likely causing her pain.  Right pleural effusion. Felt to be possible parapneumonic. Patient does not  have significant fluid for aspiration. She does not describe any shortness of breath. Continue to monitor    Time spent: 30 minutes    [facility] Pager [phone]. If 7PM-7AM, please contact night-coverage at [email or web address], password [facility] 02/15/2015, 10:27 AM  LOS: 5 days

## 2015-02-15 NOTE — Progress Notes (Signed)
PHARMACIST - PHYSICIAN COMMUNICATION DR:   [place] CONCERNING: Antibiotic IV to Oral Route Change Policy  RECOMMENDATION: This patient is receiving Levaquin by the intravenous route.  Based on criteria approved by the Pharmacy and Therapeutics Committee, the antibiotic(s) is/are being converted to the equivalent oral dose form(s).   DESCRIPTION: These criteria include:  Patient being treated for a respiratory tract infection, urinary tract infection, cellulitis or clostridium difficile associated diarrhea if on metronidazole  The patient is not neutropenic and does not exhibit a GI malabsorption state  The patient is eating (either orally or via tube) and/or has been taking other orally administered medications for a least 24 hours  The patient is improving clinically and has a Tmax < 100.5  If you have questions about this conversion, please contact the Pharmacy Department  [x]  ( [phone] )  [place] []  ( [phone] )  [facility] []  ( [phone] )  [place] []  ( [phone]  [facility] []  ( [phone] )  [facility]    S. Dustine Stickler, PharmD 

## 2015-02-15 NOTE — Care Management Note (Signed)
Case Management Note  Patient Details  Name: RUSSELL ENGELSTAD MRN: [id number] Date of Birth: [birth year]  Expected Discharge Date:                  Expected Discharge Plan:  [place]  In-House Referral:  Chaplain  Discharge planning Services  CM Consult  Post Acute Care Choice:  Home Health Choice offered to:  Patient  DME Arranged:    DME Agency:     HH Arranged:  PT, RN [place] Agency:  [place]  Status of Service:  Completed, signed off  Medicare Important Message Given:    Date Medicare IM Given:    Medicare IM give by:    Date Additional Medicare IM Given:    Additional Medicare Important Message give by:     If discussed at [place] of Stay Meetings, dates discussed:    Additional Comments: [place] home over weekend. [facility] is aware of DC plan. Weekend RN to fax [facility] orders and call to notify them of DC. Pt made aware that [facility] has 48 hours to make their first visit. No further CM needs anticipated.  Sherald Barge, RN 02/15/2015, 12:19 PM

## 2015-02-15 NOTE — Progress Notes (Signed)
PT TRANSFRERING TO  ROOM 302 AS MED/SURG PT. PT ALERT AND ORIENTED DENIES ANY DISTRESS OR DISCOMFORT.RT CHEST PORTACATH ACCESSED, PATENT AND NSL. RT FOREARM NSL PATENT. HR 80'S IN NS. PT ABLE TO VOID ON BSC. FAMILY AT BEDSIDE. NO FURTHER NAUSEA. TRANSFER REPORT CALLED TO AUDREY RN ON 300.

## 2015-02-16 LAB — CULTURE, BLOOD (ROUTINE X 2)
Culture: NO GROWTH
Culture: NO GROWTH

## 2015-02-16 LAB — GLUCOSE, CAPILLARY
GLUCOSE-CAPILLARY: 89 mg/dL (ref 65–99)
GLUCOSE-CAPILLARY: 182 mg/dL — AB (ref 65–99)

## 2015-02-16 MED ORDER — HEPARIN SOD (PORK) LOCK FLUSH 100 UNIT/ML IV SOLN
500.0000 [IU] | INTRAVENOUS | Status: DC | PRN
  Filled 2015-02-16: qty 5

## 2015-02-16 MED ORDER — DEXAMETHASONE 4 MG PO TABS: ORAL_TABLET | ORAL | Status: DC

## 2015-02-16 MED ORDER — HEPARIN SOD (PORK) LOCK FLUSH 100 UNIT/ML IV SOLN
500.0000 [IU] | INTRAVENOUS | Status: DC
  Administered 2015-02-16: 500 [IU]

## 2015-02-16 MED ORDER — LEVOFLOXACIN 750 MG PO TABS: 750.0000 mg | ORAL_TABLET | Freq: Every day | ORAL | Status: DC

## 2015-02-16 NOTE — Discharge Summary (Signed)
Physician Discharge Summary  Brenda Avery ZWC:[id number] DOB: 10-04-1970 DOA: 02/10/2015  PCP: Molli Hazard, MD  Admit date: 02/10/2015 Discharge date: 02/16/2015  Time spent: 54minutes  Recommendations for Outpatient Follow-up:  1. Follow up with oncology as scheduled 2. 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 She has metastatic breast cancer with metastases to multiple sites including liver, bone . she is currently undergoing chemotherapy. She presents to the ER after syncopal episode and found to be hypotensive and tachycardic. Chest x-ray showed possible pneumonia with parapneumonic effusion. Lactic acid was elevated at 2.5 and WBC count was 20,000. She was admitted further treatments. Lactic acid was elevated at 2.5 and WBC count was 20,000. She was admitted for further treatments.  Hospital Course:  Sepsis, secondary to HCAP; parapneumonic effusion. On admission, the patient was afebrile, but her white blood cell count was 20.4. She was mildly tachycardic and her blood pressure was low-normal. Her lactic acid level was 2.7. Her chest x-ray revealed new right effusion and lower lobe consolidation/pneumonia.  Blood cultures were ordered and she was started on Zosyn and vancomycin and IV fluids.  -Blood cultures remained negative to date. -She remained afebrile. Her white blood cell count improved.  -Blood pressures stabilized - patient had been on chronic decadron and therefore received stress dose steroids in the hospital. These steroids will be slowly weaned off in the outpatient setting. Antibiotics were de escalated to levaquin to complete her course  Syncope and collapse. She had a syncopal episode following a bowel movement at home. The etiology could have been vasovagal, but in the setting of sepsis, dehydration/hypovolemia, and electrolyte derangements, the etiology was likely multifactorial. She is currently neurologically intact. Her TSH was within normal limits.  Hyponatremia. The patient's serum sodium was 129 on admission. This was likely from hypovolemia/dehydration. She was started on normal saline with improvement in her serum sodium.   Type 2 diabetes mellitus. The patient had been treated with diet alone. Her blood glucose was elevated in the hospital, which could be attributed to the dextrose in the IV fluids and steroids. She will be continued on sliding scale insulin as she was taking prior to admission. I anticipate this should improve as steroids are tapered Hemoglobin A1c elevated at 9.9.  Recent oral thrush. She was treated with intravenous diflucan. She is now on oral diflucan per palliative medicine for prophylaxis  Elevated liver transaminases. The patient's LFTs are elevated, likely secondary to metastasis to the liver. continue to monitor.  Breast cancer, with metastasis to multiple sites. The patient is under the care of Dr. Whitney Muse and she is undergoing chemotherapy and is status post radiation therapy. Dr. Whitney Muse was consulted. She ordered a palliative care medicine consult  to assist with end-of-life discussion and goals of care. The patient's prognosis is  poor, but the situation is complicated given that she has a husband and 2 young children. She does not appear ready/willing to discuss hospice or end of life issues. Patient remains a full code. Appreciate palliative care input. This will need further discussions on an outpatient basis.  Chest pain. CT chest images were reviewed by Dr. Thornton Papas and it was noted that patient has a patent Port-A-Cath without extravasation of any contrast. It was also noted that patient has a bony metastatic lesion posterior to her Port-A-Cath reservoir which is likely causing her pain.  Right pleural effusion. Felt to be possibly parapneumonic. Patient did not have significant fluid for aspiration. She does not describe any shortness of breath. She will need repeat chest xray in 3-4 weeks to ensure resolution.  Procedures:  Echo: - Mild LVH with LVEF 65-70% and grade 1 diastolic dysfunction. Trivial tricuspid regurgitation. Probable venous catheter noted within right atrium.  Consultations:  Oncology  Palliative care  Discharge Exam: Filed Vitals:   02/16/15 0622  BP: 115/61  Pulse: 98  Temp: 98.2 F (36.8 C)  Resp: 18    General: NAD Cardiovascular: S1, S2 RRR Respiratory: CTA B  Discharge Instructions   Discharge Instructions    Diet general    Complete by:  As directed      Face-to-face encounter (required for Medicare/Medicaid patients)    Complete by:  As directed   I Rally Ouch certify that this patient is under my care and that I, or a nurse practitioner or physician's assistant working with me, had a face-to-face encounter that meets the physician face-to-face encounter requirements with this patient on 02/16/2015. The encounter with the patient was in whole, or in part for the following medical condition(s) which is the primary reason for home health care (List medical condition): metastatic breast cancer, generalized weakness, pneumonia  The encounter with the patient was in  whole, or in part, for the following medical condition, which is the primary reason for home health care:  pneumonia, generalized weakness  I certify that, based on my findings, the following services are medically necessary home health services:   Nursing Physical therapy    Reason for Medically Necessary Home Health Services:  Skilled Nursing- Teaching of Disease Process/Symptom Management  My clinical findings support the need for the above services:  Unable to leave home safely without assistance and/or assistive device  Further, I certify that my clinical findings support that this patient is homebound due to:  Unable to leave home safely without assistance     Home Health    Complete by:  As directed   To provide the following care/treatments:   PT RN       Increase activity slowly    Complete by:  As directed           Discharge Medication List as of 02/16/2015  1:05 PM    START taking these medications   Details  levofloxacin (LEVAQUIN) 750 MG tablet Take 1 tablet (750 mg total) by mouth daily., Starting 02/16/2015, Until Discontinued, Print      CONTINUE these medications which have CHANGED   Details  dexamethasone (DECADRON) 4 MG tablet Take 4mg  po daily for 1 week then take 4mg  every other day, Print      CONTINUE these medications which have NOT CHANGED   Details  calcium-vitamin D (OSCAL WITH D) 500-200 MG-UNIT per tablet Take 2 tablets by mouth  2 (two) times daily., Starting 01/17/2015, Until Discontinued, Print    Diphenhyd-Hydrocort-Nystatin (FIRST-DUKES MOUTHWASH) SUSP Swish and swallow 70ml four times a day., Normal    fluconazole (DIFLUCAN) 40 MG/ML suspension Take 2.5 mLs (100 mg total) by mouth daily. Total of 14 days, Starting 02/01/2015, Until Discontinued, Normal    insulin aspart (NOVOLOG) 100 UNIT/ML FlexPen Use in accordance to provided sliding scale, Normal    pantoprazole (PROTONIX) 40 MG tablet Take 1 tablet (40 mg total) by mouth daily., Starting  01/17/2015, Until Discontinued, Print    Potassium Bicarb-Citric Acid 20 MEQ TBEF Take 1 tablet (20 mEq total) by mouth 2 (two) times daily., Starting 02/04/2015, Until Discontinued, Normal    BAYER CONTOUR TEST test strip Historical Med    fentaNYL (DURAGESIC) 12 MCG/HR Place 1 patch (12.5 mcg total) onto the skin every 3 (three) days., Starting 02/04/2015, Until Discontinued, Print    lidocaine-prilocaine (EMLA) cream Apply 1 application topically as needed., Starting 06/22/2014, Until Discontinued, Normal    ondansetron (ZOFRAN-ODT) 8 MG disintegrating tablet Take 1 tablet (8 mg total) by mouth every 8 (eight) hours as needed for nausea or vomiting., Starting 01/17/2015, Until Discontinued, Print    polyethylene glycol (MIRALAX / GLYCOLAX) packet Take 17 g by mouth daily as needed for mild constipation., Starting 01/17/2015, Until Discontinued, Print    promethazine (PHENERGAN) 25 MG suppository Place 25 mg rectally every 8 (eight) hours as needed for nausea or vomiting. , Starting 01/15/2015, Until Discontinued, Historical Med    promethazine (PHENERGAN) 25 MG tablet Take 1 tablet (25 mg total) by mouth every 6 (six) hours as needed for nausea or vomiting., Starting 01/22/2015, Until Discontinued, Normal    traMADol (ULTRAM) 50 MG tablet Take 50 mg by mouth every 12 (twelve) hours as needed for moderate pain. , Until Discontinued, Historical Med      STOP taking these medications     Eribulin Mesylate (HALAVEN IV)      oxyCODONE (OXY IR/ROXICODONE) 5 MG immediate release tablet      oxyCODONE (OXY IR/ROXICODONE) 5 MG immediate release tablet        Allergies  Allergen Reactions  . Metronidazole Hives and Itching  . Sulfonamide Derivatives Hives, Itching and Rash   Follow-up Information    Follow up with [facility].   Contact information:   [address] [place] [address] [phone]       Follow up with Molli Hazard, MD.   Specialties:   Hematology and Oncology, Oncology   Why:  call office next week for appointment   Contact information:   [place]  [address] [phone]        The results of significant diagnostics from this hospitalization (including imaging, microbiology, ancillary and laboratory) are listed below for reference.    Significant Diagnostic Studies: Ct Angio Chest Pe W/cm &/or Wo Cm  02/13/2015   CLINICAL DATA:  Chest pain, tachycardia, thoracentesis today. Bilateral leg swelling and history of breast cancer.  EXAM: CT ANGIOGRAPHY CHEST WITH CONTRAST  TECHNIQUE: Multidetector CT imaging of the chest was performed using the standard protocol during bolus administration of intravenous contrast. Multiplanar CT image reconstructions and MIPs were obtained to evaluate the vascular anatomy.  CONTRAST:  170mL OMNIPAQUE IOHEXOL 350 MG/ML SOLN  COMPARISON:  12/03/2014 and PET 09/03/2014.  FINDINGS: Mediastinum/Nodes: Negative for pulmonary embolus. Right IJ Port-A-Cath terminates in the right atrium. Mediastinal lymph nodes measure up to 9 mm in the prevascular space (previously 7 mm).  No hilar, axillary or internal mammary lymph nodes. Bilateral axillary surgical clips. Heart size normal. Small amount of anterior pericardial fluid is new.  Lungs/Pleura: Unit small right pleural effusion with compressive atelectasis in the right lower lobe. Subpleural densities in the anterior left upper lobe may be due to radiation therapy, stable. Airway is unremarkable.  Upper abdomen: Liver is nearly completely replaced with low-attenuation lesions, progressive from 12/03/2014. Index lesion in the left hepatic lobe measures approximately 3.1 cm (series 4, image 84), previously 2.2 cm. Visualized portions of the adrenal glands and left kidney are unremarkable. Spleen is heterogeneous and somewhat nodular in appearance, as before. Visualized portions of the pancreas and stomach are grossly unremarkable. Upper abdominal lymph  nodes measure up to approximately 12 mm.  Musculoskeletal: Mixed lytic and sclerotic lesion in the manubrium is again noted. Sclerosis in the sternum is also noted.  Review of the MIP images confirms the above findings.  IMPRESSION: 1. Negative for pulmonary embolus. 2. Mediastinal lymph nodes appear slightly larger. 3. Progressive hepatic metastatic disease. 4. Small amount of anterior pericardial fluid is new. 5. Small right pleural effusion with compressive atelectasis in the right lower lobe. 6. Osseous metastatic disease is grossly stable.   Electronically Signed   By: Lorin Picket M.D.   On: 02/13/2015 15:19   [place] Chest  02/13/2015   CLINICAL DATA:  RIGHT pleural effusion, for thoracentesis  EXAM: CHEST ULTRASOUND  COMPARISON:  Chest radiograph 02/12/2015  FINDINGS: Sonography of the RIGHT chest performed.  Only a small RIGHT pleural effusion is identified, insufficient for expected therapeutic benefit.  Thoracentesis not performed.  IMPRESSION: Only a small RIGHT pleural effusion is sonographically visualized, insufficient for expected therapeutic benefit from thoracentesis.   Electronically Signed   By: Lavonia Dana M.D.   On: 02/13/2015 13:34   Dg Chest Port 1 View  02/12/2015   CLINICAL DATA:  Right pleural effusion.  Shortness of breath.  EXAM: PORTABLE CHEST - 1 VIEW  COMPARISON:  02/10/2015  FINDINGS: There is a persistent small right pleural effusion. No change since the prior study. Power port in place. Left lung is clear. Heart size and vascularity are normal.  IMPRESSION: Unchanged right pleural effusion. I suspect that this is a malignant effusion or sympathetic effusions secondary to the underlying liver metastases. I doubt that the patient has an infiltrate but probably has slight compressive atelectasis of the right lower lobe.   Electronically Signed   By: Lorriane Shire M.D.   On: 02/12/2015 19:35   Dg Chest Port 1 View  02/10/2015   CLINICAL DATA:  Syncopal episode today, history of  breast cancer  EXAM: PORTABLE CHEST - 1 VIEW  COMPARISON:  09/20/2012  FINDINGS: Heart size unchanged and normal. Port-A-Cath on the right is stable. The left lung is clear.  There is opacity over the right lower lobe which appears to represent a combination of small to moderate pleural effusion and consolidation. This is new from prior studies.  IMPRESSION: New right effusion and lower lobe consolidation.   Electronically Signed   By: Skipper Cliche M.D.   On: 02/10/2015 14:27    Microbiology: Recent Results (from the past 240 hour(s))  Culture, blood (routine x 2)     Status: None   Collection Time: 02/10/15  3:08 PM  Result Value Ref Range Status   Specimen Description BLOOD RIGHT ARM  Final   Special Requests BOTTLES DRAWN AEROBIC ONLY 6CC  Final   Culture NO GROWTH 6 DAYS  Final   Report Status 02/16/2015 FINAL  Final  Culture, blood (routine x 2)     Status: None   Collection Time: 02/10/15  3:16 PM  Result Value Ref Range Status   Specimen Description BLOOD RIGHT HAND  Final   Special Requests BOTTLES DRAWN AEROBIC ONLY [place]  Final   Culture NO GROWTH 6 DAYS  Final   Report Status 02/16/2015 FINAL  Final  Urine culture     Status: None   Collection Time: 02/10/15  3:30 PM  Result Value Ref Range Status   Specimen Description URINE, CLEAN CATCH  Final   Special Requests NONE  Final   Culture   Final    MULTIPLE SPECIES PRESENT, SUGGEST RECOLLECTION IF CLINICALLY INDICATED Performed at [facility]    Report Status 02/12/2015 FINAL  Final  MRSA PCR Screening     Status: None   Collection Time: 02/10/15  6:10 PM  Result Value Ref Range Status   MRSA by PCR NEGATIVE NEGATIVE Final    Comment:        The GeneXpert MRSA Assay (FDA approved for NASAL specimens only), is one component of a comprehensive MRSA colonization surveillance program. It is not intended to diagnose MRSA infection nor to guide or monitor treatment for MRSA infections.      Labs: Basic  Metabolic Panel:  Recent Labs Lab 02/11/15 0443  02/12/15 0430 02/12/15 1325 02/13/15 0515 02/14/15 0515 02/15/15 0530  NA 132*  < > 131* 131* 136 138 139  K 2.9*  < > 2.7* 3.0* 3.1* 4.3 4.0  CL 94*  < > 93* 93* 97* 100* 101  CO2 29  < > 29 27 31 29 30   GLUCOSE 327*  < > 278* 305* 180* 281* 264*  BUN 9  < > 7 7 7 10 14   CREATININE 0.59  < > 0.60 0.62 0.98 1.21* 1.20*  CALCIUM 6.7*  < > 6.9* 7.3* 7.4* 7.7* 8.0*  MG 1.5*  --   --   --  1.7  --   --   < > = values in this interval not displayed. Liver Function Tests:  Recent Labs Lab 02/10/15 1358 02/11/15 0443  AST 75* 57*  ALT 98* 77*  ALKPHOS 253* 192*  BILITOT 1.8* 2.0*  PROT 5.6* 4.7*  ALBUMIN 2.4* 2.0*    Recent Labs Lab 02/10/15 1358  LIPASE 38   No results for input(s): AMMONIA in the last 168 hours. CBC:  Recent Labs Lab 02/10/15 1358  02/11/15 0443 02/12/15 0430 02/13/15 0515 02/14/15 0515 02/15/15 0530  WBC 20.4*  < > 14.3* 5.8 2.0* 2.8* 6.2  NEUTROABS 19.4*  --   --   --   --   --   --   HGB 13.8  < > 11.1* 10.8* 10.8* 10.4* 9.6*  HCT 40.0  < > 32.8* 32.4* 31.7* 30.8* 28.0*  MCV 79.5  < > 79.2 79.0 79.1 79.4 79.1  PLT 202  < > 168 173 154 165 143*  < > = values in this interval not displayed. Cardiac Enzymes: No results for input(s): CKTOTAL, CKMB, CKMBINDEX, TROPONINI in the last 168 hours. BNP: BNP (last 3 results) No results for input(s): BNP in the last 8760 hours.  ProBNP (last 3 results) No results for input(s): PROBNP in the last 8760 hours.  CBG:  Recent Labs Lab 02/15/15 1136 02/15/15 1720 02/15/15 2104 02/16/15 0724 02/16/15 1144  GLUCAP 219* 231* 171* 89 182*  Signed:  Teisha Trowbridge  [facility] 02/16/2015, 6:07 PM

## 2015-02-16 NOTE — Discharge Planning (Signed)
Pt being discharged today pt has no questions or concerns, vitals stable, no c/o pain, pt resting in bed, all discharge paperwork sent with patient, [facility] notified of patient discharge home

## 2015-02-18 ENCOUNTER — Encounter (HOSPITAL_COMMUNITY): Payer: Self-pay | Admitting: Hematology & Oncology

## 2015-02-20 ENCOUNTER — Encounter (HOSPITAL_COMMUNITY): Payer: Self-pay | Admitting: Oncology

## 2015-02-20 ENCOUNTER — Other Ambulatory Visit (HOSPITAL_COMMUNITY): Payer: Self-pay | Admitting: Oncology

## 2015-02-20 DIAGNOSIS — F329 Major depressive disorder, single episode, unspecified: Secondary | ICD-10-CM

## 2015-02-20 DIAGNOSIS — F419 Anxiety disorder, unspecified: Secondary | ICD-10-CM

## 2015-02-20 DIAGNOSIS — F32A Depression, unspecified: Secondary | ICD-10-CM

## 2015-02-20 DIAGNOSIS — C50912 Malignant neoplasm of unspecified site of left female breast: Secondary | ICD-10-CM

## 2015-02-20 HISTORY — DX: Anxiety disorder, unspecified: F41.9

## 2015-02-20 HISTORY — DX: Depression, unspecified: F32.A

## 2015-02-20 MED ORDER — ONDANSETRON 8 MG PO TBDP: 8.0000 mg | ORAL_TABLET | Freq: Three times a day (TID) | ORAL | Status: DC | PRN

## 2015-02-20 MED ORDER — ALPRAZOLAM 0.5 MG PO TABS: 0.5000 mg | ORAL_TABLET | Freq: Three times a day (TID) | ORAL | Status: AC | PRN

## 2015-02-20 MED ORDER — ESCITALOPRAM OXALATE 10 MG PO TABS: 10.0000 mg | ORAL_TABLET | Freq: Every day | ORAL | Status: AC

## 2015-02-21 ENCOUNTER — Other Ambulatory Visit (HOSPITAL_COMMUNITY): Payer: Self-pay | Admitting: *Deleted

## 2015-02-21 ENCOUNTER — Other Ambulatory Visit (HOSPITAL_COMMUNITY)
Admission: RE | Admit: 2015-02-21 | Discharge: 2015-02-21 | Disposition: A | Discharge status: Home / Self Care | Payer: BLUE CROSS/BLUE SHIELD | Source: Other Acute Inpatient Hospital | Attending: Hematology & Oncology | Admitting: Hematology & Oncology

## 2015-02-21 DIAGNOSIS — A419 Sepsis, unspecified organism: Secondary | ICD-10-CM | POA: Insufficient documentation

## 2015-02-21 DIAGNOSIS — E860 Dehydration: Secondary | ICD-10-CM | POA: Insufficient documentation

## 2015-02-21 DIAGNOSIS — C50919 Malignant neoplasm of unspecified site of unspecified female breast: Secondary | ICD-10-CM | POA: Insufficient documentation

## 2015-02-21 DIAGNOSIS — E119 Type 2 diabetes mellitus without complications: Secondary | ICD-10-CM | POA: Diagnosis not present

## 2015-02-21 LAB — CBC WITH DIFFERENTIAL/PLATELET
BASOS ABS: 0 10*3/uL (ref 0.0–0.1)
Basophils Relative: 0 % (ref 0–1)
Eosinophils Absolute: 0 10*3/uL (ref 0.0–0.7)
Eosinophils Relative: 0 % (ref 0–5)
HCT: 33.9 % — ABNORMAL LOW (ref 36.0–46.0)
Hemoglobin: 11.6 g/dL — ABNORMAL LOW (ref 12.0–15.0)
Lymphocytes Relative: 14 % (ref 12–46)
Lymphs Abs: 1.5 10*3/uL (ref 0.7–4.0)
MCH: 27.4 pg (ref 26.0–34.0)
MCHC: 34.2 g/dL (ref 30.0–36.0)
MCV: 80 fL (ref 78.0–100.0)
MONO ABS: 0.6 10*3/uL (ref 0.1–1.0)
Monocytes Relative: 6 % (ref 3–12)
NEUTROS ABS: 8.7 10*3/uL — AB (ref 1.7–7.7)
Neutrophils Relative %: 80 % — ABNORMAL HIGH (ref 43–77)
Platelets: 129 10*3/uL — ABNORMAL LOW (ref 150–400)
RBC: 4.24 MIL/uL (ref 3.87–5.11)
RDW: 20.9 % — AB (ref 11.5–15.5)
WBC: 10.9 10*3/uL — AB (ref 4.0–10.5)

## 2015-02-21 LAB — COMPREHENSIVE METABOLIC PANEL
ALK PHOS: 176 U/L — AB (ref 38–126)
ALT: 50 U/L (ref 14–54)
AST: 46 U/L — ABNORMAL HIGH (ref 15–41)
Albumin: 2.8 g/dL — ABNORMAL LOW (ref 3.5–5.0)
Anion gap: 16 — ABNORMAL HIGH (ref 5–15)
BUN: 28 mg/dL — AB (ref 6–20)
CO2: 32 mmol/L (ref 22–32)
CREATININE: 1.65 mg/dL — AB (ref 0.44–1.00)
Calcium: 7.6 mg/dL — ABNORMAL LOW (ref 8.9–10.3)
Chloride: 94 mmol/L — ABNORMAL LOW (ref 101–111)
GFR calc Af Amer: 42 mL/min — ABNORMAL LOW (ref >60)
GFR, EST NON AFRICAN AMERICAN: 37 mL/min — AB (ref >60)
GLUCOSE: 207 mg/dL — AB (ref 65–99)
Potassium: 2.8 mmol/L — ABNORMAL LOW (ref 3.5–5.1)
Sodium: 142 mmol/L (ref 135–145)
Total Bilirubin: 1.4 mg/dL — ABNORMAL HIGH (ref 0.3–1.2)
Total Protein: 5.4 g/dL — ABNORMAL LOW (ref 6.5–8.1)

## 2015-02-22 ENCOUNTER — Inpatient Hospital Stay (HOSPITAL_COMMUNITY): Payer: BLUE CROSS/BLUE SHIELD

## 2015-02-22 ENCOUNTER — Other Ambulatory Visit (HOSPITAL_COMMUNITY): Payer: Self-pay | Admitting: *Deleted

## 2015-02-22 DIAGNOSIS — E876 Hypokalemia: Secondary | ICD-10-CM

## 2015-02-22 MED ORDER — POTASSIUM BICARB-CITRIC ACID 20 MEQ PO TBEF: 40.0000 meq | EFFERVESCENT_TABLET | Freq: Two times a day (BID) | ORAL | Status: DC

## 2015-02-23 ENCOUNTER — Other Ambulatory Visit (HOSPITAL_COMMUNITY): Payer: Self-pay | Admitting: Hematology & Oncology

## 2015-02-25 ENCOUNTER — Other Ambulatory Visit (HOSPITAL_COMMUNITY): Payer: Self-pay | Admitting: Hematology & Oncology

## 2015-02-25 ENCOUNTER — Telehealth (HOSPITAL_COMMUNITY): Payer: Self-pay | Admitting: Hematology & Oncology

## 2015-02-25 DIAGNOSIS — C50912 Malignant neoplasm of unspecified site of left female breast: Secondary | ICD-10-CM

## 2015-02-25 DIAGNOSIS — E119 Type 2 diabetes mellitus without complications: Secondary | ICD-10-CM

## 2015-02-25 MED ORDER — FENTANYL 12 MCG/HR TD PT72: 12.5000 ug | MEDICATED_PATCH | TRANSDERMAL | Status: DC

## 2015-02-25 NOTE — Telephone Encounter (Signed)
Spoke with Grayland Ormond about [place]. Requesting refill on duragesic. Have home health but know it is not "enough help" but have family "pitching in".  Discussed hospice and benefit of their services but still reluctant to consider. Family will be by in am for script. Donald Pore MD

## 2015-02-28 ENCOUNTER — Other Ambulatory Visit (HOSPITAL_COMMUNITY): Payer: Self-pay | Admitting: *Deleted

## 2015-02-28 ENCOUNTER — Other Ambulatory Visit (HOSPITAL_COMMUNITY)
Admission: RE | Admit: 2015-02-28 | Discharge: 2015-02-28 | Disposition: A | Discharge status: Home / Self Care | Payer: BLUE CROSS/BLUE SHIELD | Source: Other Acute Inpatient Hospital | Attending: Hematology & Oncology | Admitting: Hematology & Oncology

## 2015-02-28 DIAGNOSIS — E118 Type 2 diabetes mellitus with unspecified complications: Secondary | ICD-10-CM | POA: Insufficient documentation

## 2015-02-28 DIAGNOSIS — C50912 Malignant neoplasm of unspecified site of left female breast: Secondary | ICD-10-CM

## 2015-02-28 LAB — CBC WITH DIFFERENTIAL/PLATELET
Basophils Absolute: 0 10*3/uL (ref 0.0–0.1)
Basophils Relative: 0 % (ref 0–1)
Eosinophils Absolute: 0.1 10*3/uL (ref 0.0–0.7)
Eosinophils Relative: 1 % (ref 0–5)
HCT: 39.6 % (ref 36.0–46.0)
HEMOGLOBIN: 13.5 g/dL (ref 12.0–15.0)
LYMPHS PCT: 17 % (ref 12–46)
Lymphs Abs: 1.4 10*3/uL (ref 0.7–4.0)
MCH: 27.1 pg (ref 26.0–34.0)
MCHC: 34.1 g/dL (ref 30.0–36.0)
MCV: 79.5 fL (ref 78.0–100.0)
MONO ABS: 0.5 10*3/uL (ref 0.1–1.0)
MONOS PCT: 5 % (ref 3–12)
Neutro Abs: 6.7 10*3/uL (ref 1.7–7.7)
Neutrophils Relative %: 77 % (ref 43–77)
Platelets: 188 10*3/uL (ref 150–400)
RBC: 4.98 MIL/uL (ref 3.87–5.11)
RDW: 20.7 % — AB (ref 11.5–15.5)
WBC: 8.7 10*3/uL (ref 4.0–10.5)

## 2015-02-28 LAB — COMPREHENSIVE METABOLIC PANEL
ALT: 56 U/L — ABNORMAL HIGH (ref 14–54)
ANION GAP: 16 — AB (ref 5–15)
AST: 72 U/L — AB (ref 15–41)
Albumin: 3.1 g/dL — ABNORMAL LOW (ref 3.5–5.0)
Alkaline Phosphatase: 155 U/L — ABNORMAL HIGH (ref 38–126)
BUN: 19 mg/dL (ref 6–20)
CALCIUM: 7 mg/dL — AB (ref 8.9–10.3)
CHLORIDE: 84 mmol/L — AB (ref 101–111)
CO2: 31 mmol/L (ref 22–32)
CREATININE: 1.15 mg/dL — AB (ref 0.44–1.00)
GFR calc Af Amer: >60 mL/min (ref >60)
GFR, EST NON AFRICAN AMERICAN: 57 mL/min — AB (ref >60)
Glucose, Bld: 156 mg/dL — ABNORMAL HIGH (ref 65–99)
Potassium: 2.6 mmol/L — CL (ref 3.5–5.1)
SODIUM: 131 mmol/L — AB (ref 135–145)
Total Bilirubin: 2.2 mg/dL — ABNORMAL HIGH (ref 0.3–1.2)
Total Protein: 6.8 g/dL (ref 6.5–8.1)

## 2015-02-28 LAB — MAGNESIUM: MAGNESIUM: 2.2 mg/dL (ref 1.7–2.4)
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
## 2015-03-01 ENCOUNTER — Ambulatory Visit (HOSPITAL_COMMUNITY): Payer: BLUE CROSS/BLUE SHIELD | Admitting: Oncology

## 2015-03-01 ENCOUNTER — Inpatient Hospital Stay (HOSPITAL_COMMUNITY): Payer: BLUE CROSS/BLUE SHIELD

## 2015-03-01 ENCOUNTER — Other Ambulatory Visit (HOSPITAL_COMMUNITY): Payer: Self-pay | Admitting: Hematology & Oncology

## 2015-03-01 ENCOUNTER — Ambulatory Visit (HOSPITAL_COMMUNITY): Payer: BLUE CROSS/BLUE SHIELD | Admitting: Hematology & Oncology

## 2015-03-03 NOTE — Progress Notes (Signed)
This encounter was created in error - please disregard.

## 2015-03-05 ENCOUNTER — Inpatient Hospital Stay (HOSPITAL_COMMUNITY)
Admission: EM | Admit: 2015-03-05 | Discharge: 2015-03-08 | DRG: 435 | Disposition: A | Discharge status: Home / Self Care | Payer: BLUE CROSS/BLUE SHIELD | Attending: Family Medicine | Admitting: Family Medicine

## 2015-03-05 ENCOUNTER — Telehealth (HOSPITAL_COMMUNITY): Payer: Self-pay | Admitting: *Deleted

## 2015-03-05 ENCOUNTER — Encounter (HOSPITAL_COMMUNITY): Payer: Self-pay

## 2015-03-05 ENCOUNTER — Emergency Department (HOSPITAL_COMMUNITY): Payer: BLUE CROSS/BLUE SHIELD

## 2015-03-05 DIAGNOSIS — F419 Anxiety disorder, unspecified: Secondary | ICD-10-CM | POA: Diagnosis present

## 2015-03-05 DIAGNOSIS — C50912 Malignant neoplasm of unspecified site of left female breast: Secondary | ICD-10-CM | POA: Diagnosis not present

## 2015-03-05 DIAGNOSIS — E119 Type 2 diabetes mellitus without complications: Secondary | ICD-10-CM

## 2015-03-05 DIAGNOSIS — E785 Hyperlipidemia, unspecified: Secondary | ICD-10-CM | POA: Diagnosis present

## 2015-03-05 DIAGNOSIS — Z882 Allergy status to sulfonamides status: Secondary | ICD-10-CM

## 2015-03-05 DIAGNOSIS — R109 Unspecified abdominal pain: Secondary | ICD-10-CM

## 2015-03-05 DIAGNOSIS — E876 Hypokalemia: Secondary | ICD-10-CM | POA: Diagnosis present

## 2015-03-05 DIAGNOSIS — Z853 Personal history of malignant neoplasm of breast: Secondary | ICD-10-CM | POA: Diagnosis not present

## 2015-03-05 DIAGNOSIS — R112 Nausea with vomiting, unspecified: Secondary | ICD-10-CM

## 2015-03-05 DIAGNOSIS — E43 Unspecified severe protein-calorie malnutrition: Secondary | ICD-10-CM | POA: Diagnosis present

## 2015-03-05 DIAGNOSIS — Z6826 Body mass index (BMI) 26.0-26.9, adult: Secondary | ICD-10-CM

## 2015-03-05 DIAGNOSIS — Z888 Allergy status to other drugs, medicaments and biological substances status: Secondary | ICD-10-CM | POA: Diagnosis not present

## 2015-03-05 DIAGNOSIS — C787 Secondary malignant neoplasm of liver and intrahepatic bile duct: Secondary | ICD-10-CM | POA: Diagnosis present

## 2015-03-05 DIAGNOSIS — Z9221 Personal history of antineoplastic chemotherapy: Secondary | ICD-10-CM

## 2015-03-05 DIAGNOSIS — F329 Major depressive disorder, single episode, unspecified: Secondary | ICD-10-CM | POA: Diagnosis present

## 2015-03-05 DIAGNOSIS — Z9013 Acquired absence of bilateral breasts and nipples: Secondary | ICD-10-CM | POA: Diagnosis present

## 2015-03-05 DIAGNOSIS — C799 Secondary malignant neoplasm of unspecified site: Secondary | ICD-10-CM | POA: Diagnosis not present

## 2015-03-05 DIAGNOSIS — R1012 Left upper quadrant pain: Secondary | ICD-10-CM | POA: Diagnosis not present

## 2015-03-05 LAB — COMPREHENSIVE METABOLIC PANEL
ALT: 64 U/L — AB (ref 14–54)
ANION GAP: 14 (ref 5–15)
AST: 104 U/L — AB (ref 15–41)
Albumin: 2.9 g/dL — ABNORMAL LOW (ref 3.5–5.0)
Alkaline Phosphatase: 136 U/L — ABNORMAL HIGH (ref 38–126)
BUN: 12 mg/dL (ref 6–20)
CO2: 32 mmol/L (ref 22–32)
Calcium: 6.8 mg/dL — ABNORMAL LOW (ref 8.9–10.3)
Chloride: 85 mmol/L — ABNORMAL LOW (ref 101–111)
Creatinine, Ser: 0.87 mg/dL (ref 0.44–1.00)
GFR calc non Af Amer: >60 mL/min (ref >60)
Glucose, Bld: 147 mg/dL — ABNORMAL HIGH (ref 65–99)
Potassium: 2.4 mmol/L — CL (ref 3.5–5.1)
SODIUM: 131 mmol/L — AB (ref 135–145)
Total Bilirubin: 2 mg/dL — ABNORMAL HIGH (ref 0.3–1.2)
Total Protein: 6 g/dL — ABNORMAL LOW (ref 6.5–8.1)

## 2015-03-05 LAB — CBC WITH DIFFERENTIAL/PLATELET
Basophils Absolute: 0 10*3/uL (ref 0.0–0.1)
Basophils Relative: 0 % (ref 0–1)
Eosinophils Absolute: 0.2 10*3/uL (ref 0.0–0.7)
Eosinophils Relative: 2 % (ref 0–5)
HEMATOCRIT: 36.4 % (ref 36.0–46.0)
HEMOGLOBIN: 12.3 g/dL (ref 12.0–15.0)
LYMPHS ABS: 1.5 10*3/uL (ref 0.7–4.0)
Lymphocytes Relative: 19 % (ref 12–46)
MCH: 27.1 pg (ref 26.0–34.0)
MCHC: 33.8 g/dL (ref 30.0–36.0)
MCV: 80.2 fL (ref 78.0–100.0)
Monocytes Absolute: 0.5 10*3/uL (ref 0.1–1.0)
Monocytes Relative: 6 % (ref 3–12)
NEUTROS PCT: 73 % (ref 43–77)
Neutro Abs: 5.8 10*3/uL (ref 1.7–7.7)
Platelets: 184 10*3/uL (ref 150–400)
RBC: 4.54 MIL/uL (ref 3.87–5.11)
RDW: 20.2 % — ABNORMAL HIGH (ref 11.5–15.5)
WBC: 7.9 10*3/uL (ref 4.0–10.5)

## 2015-03-05 LAB — URINALYSIS, ROUTINE W REFLEX MICROSCOPIC
Glucose, UA: NEGATIVE mg/dL
Ketones, ur: 15 mg/dL — AB
NITRITE: NEGATIVE
PROTEIN: NEGATIVE mg/dL
SPECIFIC GRAVITY, URINE: 1.013 (ref 1.005–1.030)
UROBILINOGEN UA: 0.2 mg/dL (ref 0.0–1.0)
pH: 6.5 (ref 5.0–8.0)

## 2015-03-05 LAB — URINE MICROSCOPIC-ADD ON

## 2015-03-05 LAB — GLUCOSE, CAPILLARY: GLUCOSE-CAPILLARY: 282 mg/dL — AB (ref 65–99)

## 2015-03-05 LAB — LIPASE, BLOOD: Lipase: 341 U/L — ABNORMAL HIGH (ref 22–51)

## 2015-03-05 LAB — MAGNESIUM: Magnesium: 1.6 mg/dL — ABNORMAL LOW (ref 1.7–2.4)

## 2015-03-05 MED ORDER — ALPRAZOLAM 0.5 MG PO TABS: 0.5000 mg | ORAL_TABLET | Freq: Three times a day (TID) | ORAL | Status: DC | PRN

## 2015-03-05 MED ORDER — ENSURE ENLIVE PO LIQD: 237.0000 mL | Freq: Two times a day (BID) | ORAL | Status: DC

## 2015-03-05 MED ORDER — PANTOPRAZOLE SODIUM 40 MG IV SOLR
40.0000 mg | Freq: Two times a day (BID) | INTRAVENOUS | Status: DC
  Administered 2015-03-05 – 2015-03-07 (×5): 40 mg via INTRAVENOUS
  Filled 2015-03-05 (×6): qty 40

## 2015-03-05 MED ORDER — ONDANSETRON HCL 4 MG PO TABS
4.0000 mg | ORAL_TABLET | Freq: Four times a day (QID) | ORAL | Status: DC | PRN
  Administered 2015-03-06 – 2015-03-07 (×4): 4 mg via ORAL
  Filled 2015-03-05 (×4): qty 1

## 2015-03-05 MED ORDER — MORPHINE SULFATE 2 MG/ML IJ SOLN: 2.0000 mg | INTRAMUSCULAR | Status: DC | PRN

## 2015-03-05 MED ORDER — IOHEXOL 300 MG/ML  SOLN
100.0000 mL | Freq: Once | INTRAMUSCULAR | Status: AC | PRN
  Administered 2015-03-05: 100 mL via INTRAVENOUS

## 2015-03-05 MED ORDER — POTASSIUM CHLORIDE 10 MEQ/100ML IV SOLN
10.0000 meq | INTRAVENOUS | Status: DC
  Administered 2015-03-06 (×2): 10 meq via INTRAVENOUS
  Filled 2015-03-05 (×2): qty 100

## 2015-03-05 MED ORDER — SENNA 8.6 MG PO TABS: 17.2000 | ORAL_TABLET | Freq: Every day | ORAL | Status: DC | PRN

## 2015-03-05 MED ORDER — POLYETHYLENE GLYCOL 3350 17 G PO PACK
17.0000 g | PACK | Freq: Every day | ORAL | Status: DC | PRN
  Filled 2015-03-05: qty 1

## 2015-03-05 MED ORDER — SENNA 8.6 MG PO TABS: 1.0000 | ORAL_TABLET | Freq: Every day | ORAL | Status: DC | PRN

## 2015-03-05 MED ORDER — ONDANSETRON HCL 4 MG/2ML IJ SOLN
4.0000 mg | Freq: Once | INTRAMUSCULAR | Status: AC
  Administered 2015-03-05: 4 mg via INTRAVENOUS
  Filled 2015-03-05: qty 2

## 2015-03-05 MED ORDER — POTASSIUM CHLORIDE 10 MEQ/100ML IV SOLN
10.0000 meq | INTRAVENOUS | Status: DC
  Administered 2015-03-05 (×2): 10 meq via INTRAVENOUS
  Filled 2015-03-05 (×2): qty 100

## 2015-03-05 MED ORDER — PANTOPRAZOLE SODIUM 40 MG PO TBEC: 40.0000 mg | DELAYED_RELEASE_TABLET | Freq: Two times a day (BID) | ORAL | Status: DC

## 2015-03-05 MED ORDER — IOHEXOL 300 MG/ML  SOLN
50.0000 mL | Freq: Once | INTRAMUSCULAR | Status: AC | PRN
  Administered 2015-03-05: 50 mL via ORAL

## 2015-03-05 MED ORDER — ONDANSETRON HCL 4 MG/2ML IJ SOLN
4.0000 mg | Freq: Four times a day (QID) | INTRAMUSCULAR | Status: DC | PRN
  Administered 2015-03-06: 4 mg via INTRAVENOUS
  Filled 2015-03-05: qty 2

## 2015-03-05 MED ORDER — BOOST / RESOURCE BREEZE PO LIQD
1.0000 | Freq: Three times a day (TID) | ORAL | Status: DC
  Administered 2015-03-06 (×2): 1 via ORAL

## 2015-03-05 MED ORDER — POTASSIUM CHLORIDE IN NACL 40-0.9 MEQ/L-% IV SOLN
INTRAVENOUS | Status: DC
  Administered 2015-03-05 – 2015-03-08 (×5): 125 mL/h via INTRAVENOUS
  Filled 2015-03-05 (×12): qty 1000

## 2015-03-05 MED ORDER — ENOXAPARIN SODIUM 40 MG/0.4ML ~~LOC~~ SOLN
40.0000 mg | SUBCUTANEOUS | Status: DC
Start: 2015-03-05 — End: 2015-03-08
  Filled 2015-03-05 (×4): qty 0.4

## 2015-03-05 MED ORDER — PANTOPRAZOLE SODIUM 40 MG PO TBEC: 40.0000 mg | DELAYED_RELEASE_TABLET | Freq: Two times a day (BID) | ORAL | Status: AC

## 2015-03-05 MED ORDER — SODIUM CHLORIDE 0.9 % IV SOLN
INTRAVENOUS | Status: DC
  Administered 2015-03-05: 12:00:00 via INTRAVENOUS

## 2015-03-05 MED ORDER — POTASSIUM CHLORIDE 10 MEQ/100ML IV SOLN
10.0000 meq | Freq: Once | INTRAVENOUS | Status: AC
  Administered 2015-03-05: 10 meq via INTRAVENOUS
  Filled 2015-03-05: qty 100

## 2015-03-05 MED ORDER — ESCITALOPRAM OXALATE 10 MG PO TABS
10.0000 mg | ORAL_TABLET | Freq: Every day | ORAL | Status: DC
  Administered 2015-03-06 – 2015-03-07 (×2): 10 mg via ORAL
  Filled 2015-03-05 (×3): qty 1

## 2015-03-05 MED ORDER — HYDROMORPHONE HCL 1 MG/ML IJ SOLN: 1.0000 mg | Freq: Once | INTRAMUSCULAR | Status: DC

## 2015-03-05 MED ORDER — OXYCODONE HCL 5 MG PO TABS
5.0000 mg | ORAL_TABLET | ORAL | Status: DC | PRN
  Administered 2015-03-06: 5 mg via ORAL
  Filled 2015-03-05: qty 1
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
## 2015-03-05 NOTE — ED Notes (Signed)
Patient not drinking the CT contrast. Encouraged patient, but only took one sip. Patient  denied nausea.

## 2015-03-05 NOTE — ED Notes (Signed)
Bed: WB91 Expected date:  Expected time:  Means of arrival:  Comments: EMS- 44yo F, LUQ pain

## 2015-03-05 NOTE — ED Notes (Signed)
I attempted to collect labs and was unsuccessful.  Nurse was made aware and she is going to give her some fluids.

## 2015-03-05 NOTE — ED Notes (Signed)
Patient refused pain med/Dilaudid at this time. EDP notified.

## 2015-03-05 NOTE — ED Notes (Signed)
Per EMS- Patient c/o LUQ pain and tender to touch x 12 hours. Patient was given 1000 ml NS per Home health nurse yesterday for dehydration.

## 2015-03-05 NOTE — ED Notes (Signed)
EDP notified that writer and ED phlebotomist unable to get blood at this time.

## 2015-03-05 NOTE — H&P (Addendum)
[facility] History and Physical  Brenda Avery WYO:[id number] DOB: 1970/12/22 DOA: 03/05/2015   PCP: Molli Hazard, MD    Chief Complaint: abdominal pain  HPI: Brenda Avery is a 44 y.o. female with hx of metatastatic breast ca to multiple sites including liver mets, hx of DM, HLD. Chemo has recentlhy been stopped and her oncologist wanted to start hospice but the patient has declined it. She presents with 2 wks of left sided abdominal pain, intermittent and usually about 30 min after eating- it goes away on its own. She had a fentanyl patch but it was making her sleepy and therefore it she removed it a few days ago. Only has mild constipation and is not vomiting. No fever or chills.  CT abd pelvis reveals increased hepatic meds, nodules in spleen that are unchanged either sarcoid or cancer- no other pathology. Lipase elevated but pancrease normal on CT.  I watched her have an attack of pain in the ER- she was suddenly in severe distress. She has been refusing pain medications in the ER. Her husband states he wants to know where the pain is coming from.  She was recently admitted at [facility] with syncope, vomiting and HCAP.    General: The patient denies anorexia, fever, weight loss Cardiac: Denies chest pain, syncope, palpitations, pedal edema  Respiratory: Denies cough, shortness of breath, wheezing GI: Denies severe indigestion/heartburn, abdominal pain, nausea, vomiting, diarrhea and constipation GU: Denies hematuria, incontinence, dysuria  Musculoskeletal: Denies arthritis  Skin: Denies suspicious skin lesions Neurologic: Denies focal weakness or numbness, change in vision Psychiatry: Denies depression or anxiety. Hematologic: no easy bruising or bleeding  All other systems reviewed and found to be negative.  Past Medical History  Diagnosis Date  . Hyperlipidemia     diet therapy per patient request  . 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 Hematuria  2000/2001    evaluted in [place] with negative results  . S/P radiation therapy  05/10/2013-06/23/2013    Left chest wall / 50.4 Gray @ 1.8 Gray per fraction x 28 fractions/ Left Supraclavicular fossa / 45 Gray @1 .8 Gray per fraction x 25 fractions/ Left PAB / 45 Gy at 1.8 Gray per fraction x 25 fractions/Left scar / 10 Gray at [employer] per fraction x 5 fractions     . Status post chemotherapy      Radiation Therapy Radiosensitizing Xeloda  . Status post chemotherapy  09/26/2012 - 11/07/2012      Neoadjuvant chemotherapy consisting of 4 cycles of a.c. From 09/26/2012 through 11/07/2012 she then received 12 weeks of paclitaxel.  . Use of tamoxifen (Nolvadex) started 06/26/13  . Type II diabetes mellitus     diet therapy   . Anxiety 02/20/2015  . Depression 02/20/2015  . Breast CA dx'd 2014    left    Past Surgical History  Procedure Laterality Date  . Rectocele repair  2004  . Cystoscopy  2001  . Nevus excision  2004    facial  . Portacath placement  09/20/2012    Procedure: INSERTION PORT-A-CATH;  Surgeon: Adin Hector, MD;  Location: [place];  Service: General;  Laterality: N/A;  port a cath insertion with flouro and ultrasound   . Mastectomy Right 04/10/2013  . Mastectomy modified radical Left 04/10/2013  . Axillary sentinel node biopsy Right 04/10/2013  . Liver biopsy    . Breast biopsy Left 2014  . Tubal ligation  2004  . Mastectomy modified  radical Left 04/10/2013    Procedure: LEFT MODIFIED RADICAL MASTECTOMY ;  Surgeon: Adin Hector, MD;  Location: [place];  Service: General;  Laterality: Left;  . Simple mastectomy with axillary sentinel node biopsy Right 04/10/2013    Procedure: RIGHT TOTAL  MASTECTOMY WITH AXILLARY SENTINEL NODE BIOPSY;  Surgeon: Adin Hector, MD;  Location: [place];  Service: General;  Laterality: Right;  Methylene Blue Injection    Social History: does not smoke or drink Lives at lives at home with family    Allergies  Allergen Reactions  .  Metronidazole Hives and Itching  . Sulfonamide Derivatives Hives, Itching and Rash    Family history:   Family History  Problem Relation Age of Onset  . Hypertension Mother   . Diabetes Mother     MI  . Heart disease Mother   . Hypertension Sister     x4 only one HTN and one thats DI  . Hypertension Brother     x4 only 1 htn & dm  . Heart disease Father   . Breast cancer Maternal Aunt     diagnosed in her 7s  . Cancer Maternal Aunt     unknown  . Breast cancer Paternal Aunt     diagnosed in her 18s  . Cervical cancer Sister 26  . Breast cancer Maternal Aunt     diagnosed in her 48s  . Breast cancer Cousin     3 maternal cousins - 2 in their 60s, 1 in her 30s      Prior to Admission medications   Medication Sig Start Date End Date Taking? Authorizing Provider  ALPRAZolam Duanne Moron) 0.5 MG tablet Take 1 tablet (0.5 mg total) by mouth 3 (three) times daily as needed for anxiety. 02/20/15  Yes Baird Cancer, PA-C  calcium-vitamin D (OSCAL WITH D) 500-200 MG-UNIT per tablet Take 2 tablets by mouth 2 (two) times daily. 01/17/15  Yes Shanker Kristeen Mans, MD  lidocaine-prilocaine (EMLA) cream Apply 1 application topically as needed. 06/22/14  Yes Laurie Panda, NP  ondansetron (ZOFRAN-ODT) 8 MG disintegrating tablet Take 1 tablet (8 mg total) by mouth every 8 (eight) hours as needed for nausea or vomiting. 02/20/15  Yes Baird Cancer, PA-C  oxyCODONE (OXY IR/ROXICODONE) 5 MG immediate release tablet Take 5 mg by mouth daily as needed for severe pain.  01/25/15  Yes Historical Provider, MD  polyethylene glycol (MIRALAX / GLYCOLAX) packet Take 17 g by mouth daily as needed for mild constipation. 01/17/15  Yes Shanker Kristeen Mans, MD  potassium chloride SA (K-DUR,KLOR-CON) 20 MEQ tablet Take 20-40 mEq by mouth 2 (two) times daily. Take 2 tablets in the morning and 1 in the evening 03/01/15  Yes Historical Provider, MD  promethazine (PHENERGAN) 25 MG tablet Take 1 tablet (25 mg total) by mouth  every 6 (six) hours as needed for nausea or vomiting. 01/22/15  Yes Patrici Ranks, MD  senna (SENOKOT) 8.6 MG TABS tablet Take 17.2-25.8 tablets by mouth daily as needed for mild constipation or moderate constipation.   Yes Historical Provider, MD  escitalopram (LEXAPRO) 10 MG tablet Take 1 tablet (10 mg total) by mouth daily. Patient not taking: Reported on 03/05/2015 02/20/15   Baird Cancer, PA-C  pantoprazole (PROTONIX) 40 MG tablet Take 1 tablet (40 mg total) by mouth 2 (two) times daily. 03/05/15   Debbe Odea, MD     Physical Exam: Filed Vitals:   03/05/15 1230 03/05/15 1430 03/05/15 1510 03/05/15 1617  BP: 109/59 108/58 96/59 92/42   Pulse: 86 75 93 91  Temp:      TempSrc:      Resp:   15 16  Height:      Weight:      SpO2: 100% 99% 96% 100%     General: AAO x 3 - has severe distress during an attack of pain HEENT: Normocephalic and Atraumatic, Mucous membranes pink                PERRLA; EOM intact; No scleral icterus,                 Nares: Patent, Oropharynx: Clear, Fair Dentition                 Neck: FROM, no cervical lymphadenopathy, thyromegaly, carotid bruit or JVD;  Breasts: deferred CHEST WALL: No tenderness  CHEST: Normal respiration, clear to auscultation bilaterally  HEART: Regular rate and rhythm; no murmurs rubs or gallops  BACK: No kyphosis or scoliosis; no CVA tenderness  GI: Positive Bowel Sounds, soft, tender in LUQ to light touch- non-distended Rectal Exam: deferred MSK: No cyanosis, clubbing, or edema Genitalia: not examined  SKIN:  no rash or ulceration  CNS: Alert and Oriented x 4, Nonfocal exam, CN 2-12 intact  Labs on Admission:  Basic Metabolic Panel:  Recent Labs Lab 02/28/15 1330 03/05/15 1303  NA 131* 131*  K 2.6* 2.4*  CL 84* 85*  CO2 31 32  GLUCOSE 156* 147*  BUN 19 12  CREATININE 1.15* 0.87  CALCIUM 7.0* 6.8*  MG 2.2  --    Liver Function Tests:  Recent Labs Lab 02/28/15 1330 03/05/15 1303  AST 72* 104*  ALT 56*  64*  ALKPHOS 155* 136*  BILITOT 2.2* 2.0*  PROT 6.8 6.0*  ALBUMIN 3.1* 2.9*    Recent Labs Lab 03/05/15 1303  LIPASE 341*   No results for input(s): AMMONIA in the last 168 hours. CBC:  Recent Labs Lab 02/28/15 1330 03/05/15 1303  WBC 8.7 7.9  NEUTROABS 6.7 5.8  HGB 13.5 12.3  HCT 39.6 36.4  MCV 79.5 80.2  PLT 188 184   Cardiac Enzymes: No results for input(s): CKTOTAL, CKMB, CKMBINDEX, TROPONINI in the last 168 hours.  BNP (last 3 results) No results for input(s): BNP in the last 8760 hours.  ProBNP (last 3 results) No results for input(s): PROBNP in the last 8760 hours.  CBG: No results for input(s): GLUCAP in the last 168 hours.  Radiological Exams on Admission: Ct Abdomen Pelvis W Contrast  03/05/2015   CLINICAL DATA:  Right lateral abdominal pain for 2 months. History of breast cancer diagnosed in 2014 with prior chemotherapy and radiation.  EXAM: CT ABDOMEN AND PELVIS WITH CONTRAST  TECHNIQUE: Multidetector CT imaging of the abdomen and pelvis was performed using the standard protocol following bolus administration of intravenous contrast.  CONTRAST:  148mL OMNIPAQUE IOHEXOL 300 MG/ML  SOLN  COMPARISON:  CT 12/03/2014  FINDINGS: No confluent airspace opacities in the lung bases. No effusions. Heart is normal size.  Marked worsening of metastatic disease within the liver with diffuse innumerable rim enhancing lesions now present. Heterogeneous appearance of the spleen with tiny hypodensities again noted, similar to prior study. This could reflect sarcoidosis or amyloid. Metastases are possible. Gallbladder, pancreas, adrenals, kidneys are unremarkable.  Stomach, large and small bowel are unremarkable. Aorta is normal caliber. No free fluid or free air. Left periaortic adenopathy has improved. Short axis diameter of a left periaortic lymph  node measures 8 mm on today's study compared with 14 mm previously. Right periaortic and retrocaval lymph nodes of also improved  slightly.  Aorta is normal caliber. Uterus, adnexae and urinary bladder are unremarkable.  Areas of sclerosis within the iliac bones bilaterally, left greater than right are stable since prior study.  IMPRESSION: Marked worsening of hepatic metastatic disease with innumerable diffuse rim enhancing lesions throughout the liver.  Stable tiny hypodensities scattered throughout the spleen which could reflect infiltrating process such as sarcoidosis, amyloid, or metastatic disease.  Improving retroperitoneal adenopathy.   Electronically Signed   By: Rolm Baptise M.D.   On: 03/05/2015 13:59    EKG: Independently reviewed. Not done- ordered  Assessment/Plan Principal Problem:   Abdominal pain - they state it occurs only after she eats - she hasn't eaten since breakfast but did drink contrast - no etiology found on CT- although lipase is high, Pancrease is normal on Ct and therefore not pancreatitis- Lipase is likely elevated from numerous metastasis - does not have a heavy stool burden on CT - will ask for a GI eval - increase Protonix to BID in case this is radiating pain from PUD  - clear liquids only unless GI changes this - Morphine and Oxycodone PRN if patient requests pain meds- has declined them in the ER - will order K pad in case there is a muscular component   Active Problems:   DM II (diabetes mellitus, type II), controlled - diet controlled- hold off on CBG    Carcinoma of left breast metastatic to multiple sites - no longer on Chemo, declining hospice- may need to call hospice just to help manage her pain if no reversible etiology found    Hypokalemia - has been low since 7/7  - obtain EKG  - replace via IV today- check Mg    Consulted:   Code Status: full code   Family Communication: husband  DVT Prophylaxis:Lovenox  Time spent: 11 min  [place], MD [facility]  If 7PM-7AM, please contact night-coverage [email or web address] 03/05/2015, 4:25 PM

## 2015-03-05 NOTE — ED Provider Notes (Signed)
CSN: [id number]     Arrival date & time 03/05/15  1054 History   First MD Initiated Contact with Patient 03/05/15 1058     Chief Complaint  Patient presents with  . Abdominal Pain     (Consider location/radiation/quality/duration/timing/severity/associated sxs/prior Treatment) HPI Comments: Patient here complaining of left upper quadrant abdominal pain times several months. Pain characterized as sharp and persistent and worse with certain movements. Has not been seen by her Dr. for this. Does have a history of metastatic breast cancer and she has not received chemotherapy. Denies any fever or chills. No vomiting or diarrhea. Denies any urinary symptoms. Has not used any pain medication for this. Denies any syncope or near syncope  Patient is a 44 y.o. female presenting with abdominal pain. The history is provided by the patient.  Abdominal Pain   Past Medical History  Diagnosis Date  . Hyperlipidemia     diet therapy per patient request  . 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 Hematuria 2000/2001    evaluted in [place] with negative results  . Breast CA     left  . S/P radiation therapy  05/10/2013-06/23/2013    Left chest wall / 50.4 Gray @ 1.8 Gray per fraction x 28 fractions/ Left Supraclavicular fossa / 45 Gray @1 .8 Gray per fraction x 25 fractions/ Left PAB / 45 Gy at 1.8 Gray per fraction x 25 fractions/Left scar / 10 Gray at [employer] per fraction x 5 fractions     . Status post chemotherapy      Radiation Therapy Radiosensitizing Xeloda  . Status post chemotherapy  09/26/2012 - 11/07/2012      Neoadjuvant chemotherapy consisting of 4 cycles of a.c. From 09/26/2012 through 11/07/2012 she then received 12 weeks of paclitaxel.  . Use of tamoxifen (Nolvadex) started 06/26/13  . Type II diabetes mellitus     diet therapy   . Anxiety 02/20/2015  . Depression 02/20/2015   Past Surgical History  Procedure Laterality Date  . Rectocele repair  2004  . Cystoscopy  2001  .  Nevus excision  2004    facial  . Portacath placement  09/20/2012    Procedure: INSERTION PORT-A-CATH;  Surgeon: Adin Hector, MD;  Location: [place];  Service: General;  Laterality: N/A;  port a cath insertion with flouro and ultrasound   . Mastectomy Right 04/10/2013  . Mastectomy modified radical Left 04/10/2013  . Axillary sentinel node biopsy Right 04/10/2013  . Liver biopsy    . Breast biopsy Left 2014  . Tubal ligation  2004  . Mastectomy modified radical Left 04/10/2013    Procedure: LEFT MODIFIED RADICAL MASTECTOMY ;  Surgeon: Adin Hector, MD;  Location: [place];  Service: General;  Laterality: Left;  . Simple mastectomy with axillary sentinel node biopsy Right 04/10/2013    Procedure: RIGHT TOTAL  MASTECTOMY WITH AXILLARY SENTINEL NODE BIOPSY;  Surgeon: Adin Hector, MD;  Location: [place];  Service: General;  Laterality: Right;  Methylene Blue Injection   Family History  Problem Relation Age of Onset  . Hypertension Mother   . Diabetes Mother     MI  . Heart disease Mother   . Hypertension Sister     x4 only one HTN and one thats DI  . Hypertension Brother     x4 only 1 htn & dm  . Heart disease Father   . Breast cancer Maternal Aunt     diagnosed in  her 30s  . Cancer Maternal Aunt     unknown  . Breast cancer Paternal Aunt     diagnosed in her 22s  . Cervical cancer Sister 6  . Breast cancer Maternal Aunt     diagnosed in her 69s  . Breast cancer Cousin     3 maternal cousins - 2 in their 28s, 1 in her 69s   History  Substance Use Topics  . Smoking status: Never Smoker   . Smokeless tobacco: Never Used  . Alcohol Use: No   OB History    No data available     Review of Systems  Gastrointestinal: Positive for abdominal pain.  All other systems reviewed and are negative.     Allergies  Metronidazole and Sulfonamide derivatives  Home Medications   Prior to Admission medications   Medication Sig Start Date End Date Taking? Authorizing Provider    ALPRAZolam Duanne Moron) 0.5 MG tablet Take 1 tablet (0.5 mg total) by mouth 3 (three) times daily as needed for anxiety. 02/20/15   Baird Cancer, PA-C  BAYER CONTOUR TEST test strip  02/01/15   Historical Provider, MD  calcium-vitamin D (OSCAL WITH D) 500-200 MG-UNIT per tablet Take 2 tablets by mouth 2 (two) times daily. 01/17/15   Shanker Kristeen Mans, MD  dexamethasone (DECADRON) 4 MG tablet Take 4mg  po daily for 1 week then take 4mg  every other day 02/16/15   Kathie Dike, MD  Diphenhyd-Hydrocort-Nystatin (FIRST-DUKES MOUTHWASH) SUSP Swish and swallow 68ml four times a day. 01/28/15   Patrici Ranks, MD  escitalopram (LEXAPRO) 10 MG tablet Take 1 tablet (10 mg total) by mouth daily. 02/20/15   Baird Cancer, PA-C  fentaNYL (DURAGESIC) 12 MCG/HR Place 1 patch (12.5 mcg total) onto the skin every 3 (three) days. 02/25/15   Patrici Ranks, MD  fluconazole (DIFLUCAN) 40 MG/ML suspension Take 2.5 mLs (100 mg total) by mouth daily. Total of 14 days 02/01/15   Patrici Ranks, MD  insulin aspart (NOVOLOG) 100 UNIT/ML FlexPen Use in accordance to provided sliding scale 02/01/15   Patrici Ranks, MD  levofloxacin (LEVAQUIN) 750 MG tablet Take 1 tablet (750 mg total) by mouth daily. 02/16/15   Kathie Dike, MD  lidocaine-prilocaine (EMLA) cream Apply 1 application topically as needed. 06/22/14   Laurie Panda, NP  ondansetron (ZOFRAN-ODT) 8 MG disintegrating tablet Take 1 tablet (8 mg total) by mouth every 8 (eight) hours as needed for nausea or vomiting. 02/20/15   Baird Cancer, PA-C  pantoprazole (PROTONIX) 40 MG tablet Take 1 tablet (40 mg total) by mouth daily. 01/17/15   Shanker Kristeen Mans, MD  polyethylene glycol (MIRALAX / GLYCOLAX) packet Take 17 g by mouth daily as needed for mild constipation. 01/17/15   Shanker Kristeen Mans, MD  Potassium Bicarb-Citric Acid (EFFER-K) 20 MEQ TBEF Take 2 tablets (40 mEq total) by mouth 2 (two) times daily. 02/22/15   Baird Cancer, PA-C  Potassium Bicarb-Citric Acid  20 MEQ TBEF Take 1 tablet (20 mEq total) by mouth 2 (two) times daily. 02/04/15   Baird Cancer, PA-C  promethazine (PHENERGAN) 25 MG suppository Place 25 mg rectally every 8 (eight) hours as needed for nausea or vomiting.  01/15/15   Historical Provider, MD  promethazine (PHENERGAN) 25 MG tablet Take 1 tablet (25 mg total) by mouth every 6 (six) hours as needed for nausea or vomiting. 01/22/15   Patrici Ranks, MD  traMADol (ULTRAM) 50 MG tablet Take 50 mg  by mouth every 12 (twelve) hours as needed for moderate pain.     Historical Provider, MD   There were no vitals taken for this visit. Physical Exam  Constitutional: She is oriented to person, place, and time. She appears well-developed and well-nourished.  Non-toxic appearance. No distress.  HENT:  Head: Normocephalic and atraumatic.  Eyes: Conjunctivae, EOM and lids are normal. Pupils are equal, round, and reactive to light.  Neck: Normal range of motion. Neck supple. No tracheal deviation present. No thyroid mass present.  Cardiovascular: Normal rate, regular rhythm and normal heart sounds.  Exam reveals no gallop.   No murmur heard. Pulmonary/Chest: Effort normal and breath sounds normal. No stridor. No respiratory distress. She has no decreased breath sounds. She has no wheezes. She has no rhonchi. She has no rales.  Abdominal: Soft. Normal appearance and bowel sounds are normal. She exhibits no distension. There is tenderness. There is rigidity. There is no rebound and no CVA tenderness.    Musculoskeletal: Normal range of motion. She exhibits no edema or tenderness.  Neurological: She is alert and oriented to person, place, and time. She has normal strength. No cranial nerve deficit or sensory deficit. GCS eye subscore is 4. GCS verbal subscore is 5. GCS motor subscore is 6.  Skin: Skin is warm and dry. No abrasion and no rash noted.  Psychiatric: She has a normal mood and affect. Her speech is normal and behavior is normal.    Nursing note and vitals reviewed.   ED Course  Procedures (including critical care time) Labs Review Labs Reviewed  CBC WITH DIFFERENTIAL/PLATELET  COMPREHENSIVE METABOLIC PANEL  LIPASE, BLOOD  URINALYSIS, ROUTINE W REFLEX MICROSCOPIC (NOT AT [facility])    Imaging Review No results found.   EKG Interpretation None      MDM   Final diagnoses:  Abdominal pain     patient offered pain medication and has deferred. Given IV potassium for her hypokalemia. Will be admitted for treatment of that as well as her pancreatitis   Lacretia Leigh, MD 03/05/15 1505

## 2015-03-05 NOTE — ED Notes (Signed)
15:57 pt can go.

## 2015-03-06 DIAGNOSIS — E876 Hypokalemia: Secondary | ICD-10-CM

## 2015-03-06 DIAGNOSIS — R1012 Left upper quadrant pain: Secondary | ICD-10-CM

## 2015-03-06 DIAGNOSIS — C799 Secondary malignant neoplasm of unspecified site: Secondary | ICD-10-CM

## 2015-03-06 DIAGNOSIS — E119 Type 2 diabetes mellitus without complications: Secondary | ICD-10-CM

## 2015-03-06 DIAGNOSIS — C50912 Malignant neoplasm of unspecified site of left female breast: Secondary | ICD-10-CM

## 2015-03-06 LAB — GLUCOSE, CAPILLARY
GLUCOSE-CAPILLARY: 209 mg/dL — AB (ref 65–99)
Glucose-Capillary: 158 mg/dL — ABNORMAL HIGH (ref 65–99)

## 2015-03-06 LAB — BASIC METABOLIC PANEL
ANION GAP: 12 (ref 5–15)
BUN: 7 mg/dL (ref 6–20)
CO2: 30 mmol/L (ref 22–32)
Calcium: 6.6 mg/dL — ABNORMAL LOW (ref 8.9–10.3)
Chloride: 94 mmol/L — ABNORMAL LOW (ref 101–111)
Creatinine, Ser: 0.81 mg/dL (ref 0.44–1.00)
GFR calc Af Amer: >60 mL/min (ref >60)
Glucose, Bld: 158 mg/dL — ABNORMAL HIGH (ref 65–99)
POTASSIUM: 2.9 mmol/L — AB (ref 3.5–5.1)
SODIUM: 136 mmol/L (ref 135–145)

## 2015-03-06 LAB — CBC
HEMATOCRIT: 33.9 % — AB (ref 36.0–46.0)
Hemoglobin: 11.2 g/dL — ABNORMAL LOW (ref 12.0–15.0)
MCH: 26.4 pg (ref 26.0–34.0)
MCHC: 33 g/dL (ref 30.0–36.0)
MCV: 80 fL (ref 78.0–100.0)
Platelets: 194 10*3/uL (ref 150–400)
RBC: 4.24 MIL/uL (ref 3.87–5.11)
RDW: 20.6 % — ABNORMAL HIGH (ref 11.5–15.5)
WBC: 8.3 10*3/uL (ref 4.0–10.5)

## 2015-03-06 MED ORDER — SODIUM CHLORIDE 0.9 % IJ SOLN
10.0000 mL | Freq: Two times a day (BID) | INTRAMUSCULAR | Status: DC
  Administered 2015-03-08: 10 mL

## 2015-03-06 MED ORDER — SODIUM CHLORIDE 0.9 % IJ SOLN
10.0000 mL | INTRAMUSCULAR | Status: DC | PRN
  Administered 2015-03-06 – 2015-03-07 (×2): 10 mL
  Filled 2015-03-06 (×2): qty 40

## 2015-03-06 MED ORDER — INSULIN ASPART 100 UNIT/ML ~~LOC~~ SOLN
0.0000 [IU] | SUBCUTANEOUS | Status: DC
  Administered 2015-03-06: 2 [IU] via SUBCUTANEOUS

## 2015-03-06 MED ORDER — POTASSIUM CHLORIDE CRYS ER 20 MEQ PO TBCR
40.0000 meq | EXTENDED_RELEASE_TABLET | Freq: Two times a day (BID) | ORAL | Status: DC
  Administered 2015-03-06 – 2015-03-07 (×4): 40 meq via ORAL
  Filled 2015-03-06 (×4): qty 2

## 2015-03-06 NOTE — Care Management Note (Signed)
Case Management Note  Patient Details  Name: Brenda Avery MRN: [id number] Date of Birth: [birth year]  Subjective/Objective: 44 y/o f admitted w/abd pain, hypokalemia. Hx: Breast Ca. From home.                   Action/Plan:d/c plan home.   Expected Discharge Date:   (unknown)               Expected Discharge Plan:  Home/Self Care  In-House Referral:     Discharge planning Services  CM Consult  Post Acute Care Choice:    Choice offered to:     DME Arranged:    DME Agency:     HH Arranged:    HH Agency:     Status of Service:  In process, will continue to follow  Medicare Important Message Given:    Date Medicare IM Given:    Medicare IM give by:    Date Additional Medicare IM Given:    Additional Medicare Important Message give by:     If discussed at [place] of Stay Meetings, dates discussed:    Additional Comments:  Dessa Phi, RN 03/06/2015, 3:52 PM

## 2015-03-06 NOTE — Progress Notes (Signed)
Initial Nutrition Assessment  DOCUMENTATION CODES:   Severe malnutrition in context of chronic illness  INTERVENTION:  - Will d/c Ensure Enlive as sister states pt does not like this supplement - Continue Boost Breeze TID, each supplement provides 250 kcal and 9 grams of protein - RD will continue to monitor for needs  NUTRITION DIAGNOSIS:   Increased nutrient needs related to catabolic illness, cancer and cancer related treatments as evidenced by estimated needs  GOAL:   Patient will meet greater than or equal to 90% of their needs  MONITOR:   PO intake, Supplement acceptance, Weight trends, Labs, I & O's  REASON FOR ASSESSMENT:   Malnutrition Screening Tool  ASSESSMENT:  44 y.o. female with hx of metatastatic breast ca to multiple sites including liver mets, hx of DM, HLD. Chemo has recentlhy been stopped and her oncologist wanted to start hospice but the patient has declined it. She presents with 2 wks of left sided abdominal pain, intermittent and usually about 30 min after eating- it goes away on its own. She had a fentanyl patch but it was making her sleepy and therefore it she removed it a few days ago. Only has mild constipation and is not vomiting. No fever or chills. CT abd pelvis reveals increased hepatic mets, nodules in spleen that are unchanged either sarcoid or cancer- no other pathology.  Pt seen for MST. BMI indicates overweight status. Pt sleeping at this time; talked with sisters who were present in the room and provided all information. PTA, pt had a poor appetite and was mainly consuming liquids and even with these would have episodes of emesis. No family members were present during breakfast this AM and no intakes have been documented. Diet has been advanced to FLD at 1335 today.  Sister indicates that pt does not like Ensure and refuses to consume it; will d/c this supplement and monitor for acceptance of Boost Breeze. Family members unsure of weight hx PTA but  state that pt has lost weight recently. Per weight hx review, pt has lost 14 lbs (8.5% body weight) in the past 18 days which is significant for time frame.  Likely not meeting needs. Unable to perform physical assessment at this time and will do so at follow-up. Medications reviewed. Labs reviewed; CBGs: 89-282 mg/dL, K: 2.9 mmol/L, Cl: 94 mmol/L, Ca: 6.6 mg/dL, Mg: 1.6 mg/dL, LFTs elevated, lipase: 341 units/L.   Diet Order:  Diet full liquid Room service appropriate?: Yes; Fluid consistency:: Thin  Skin:  Reviewed, no issues  Last BM:  PTA  Height:   Ht Readings from Last 1 Encounters:  03/05/15 5\' 3"  (1.6 m)    Weight:   Wt Readings from Last 1 Encounters:  03/05/15 150 lb (68.04 kg)    Ideal Body Weight:  52.27 kg (kg)  Wt Readings from Last 10 Encounters:  03/05/15 150 lb (68.04 kg)  02/15/15 164 lb 14.5 oz (74.8 kg)  02/06/15 168 lb 14.4 oz (76.613 kg)  02/01/15 168 lb 9.6 oz (76.476 kg)  01/25/15 175 lb 12.8 oz (79.742 kg)  01/22/15 177 lb (80.287 kg)  01/21/15 170 lb (77.111 kg)  01/21/15 172 lb 12.8 oz (78.382 kg)  01/16/15 170 lb 3.1 oz (77.2 kg)  01/07/15 177 lb 8 oz (80.513 kg)    BMI:  Body mass index is 26.58 kg/(m^2).  Estimated Nutritional Needs:   Kcal:  2000-2200  Protein:  80-90 grams  Fluid:  2 L/day  EDUCATION NEEDS:   No education needs  identified at this time    Jarome Matin, RD, LDN Inpatient Clinical Dietitian Pager # [phone] After hours/weekend pager # [phone]

## 2015-03-06 NOTE — Consult Note (Signed)
[facility] Consultation Note  Referring Provider: Dr. Debbe Odea Primary Care Physician:  Molli Hazard, MD  Reason for Consultation:  Abdominal pain  HPI: Brenda Avery is a 44 y.o. female who was admitted for, and we've been asked to see on behalf of, abdominal pain.  Patient with history of breast cancer with liver metastases, has been on variety of treatments.  Over the past one month, she has had progressively severe left upper quadrant and left periumbilical abdominal pain.  Initially intermittent, it is now near constant.  Eating makes symptoms worse, whether liquids or solids or even pills.  Symptoms associated with nausea and vomiting.  No dysphagia.  No blood in stool.  Has lost about 40 lbs over the past several weeks.  Recent CT scan showed increase in tumor burden in liver as well as diffuse adenopathy.     Past Medical History  Diagnosis Date  . Hyperlipidemia     diet therapy per patient request  . 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 Hematuria 2000/2001    evaluted in [facility] with negative results  . S/P radiation therapy  05/10/2013-06/23/2013    Left chest wall / 50.4 Gray @ 1.8 Gray per fraction x 28 fractions/ Left Supraclavicular fossa / 56 Gray @1 .8 Gray per fraction x 25 fractions/ Left PAB / 45 Gy at 1.8 Gray per fraction x 25 fractions/Left scar / 10 Gray at [employer] per fraction x 5 fractions     . Status post chemotherapy      Radiation Therapy Radiosensitizing Xeloda  . Status post chemotherapy  09/26/2012 - 11/07/2012      Neoadjuvant chemotherapy consisting of 4 cycles of a.c. From 09/26/2012 through 11/07/2012 she then received 12 weeks of paclitaxel.  . Use of tamoxifen (Nolvadex) started 06/26/13  . Type II diabetes mellitus     diet therapy   . Anxiety 02/20/2015  . Depression 02/20/2015  . Breast CA dx'd 2014    left    Past Surgical History  Procedure Laterality Date  . Rectocele repair  2004  . Cystoscopy  2001  .  Nevus excision  2004    facial  . Portacath placement  09/20/2012    Procedure: INSERTION PORT-A-CATH;  Surgeon: Adin Hector, MD;  Location: [facility];  Service: General;  Laterality: N/A;  port a cath insertion with flouro and ultrasound   . Mastectomy Right 04/10/2013  . Mastectomy modified radical Left 04/10/2013  . Axillary sentinel node biopsy Right 04/10/2013  . Liver biopsy    . Breast biopsy Left 2014  . Tubal ligation  2004  . Mastectomy modified radical Left 04/10/2013    Procedure: LEFT MODIFIED RADICAL MASTECTOMY ;  Surgeon: Adin Hector, MD;  Location: [facility];  Service: General;  Laterality: Left;  . Simple mastectomy with axillary sentinel node biopsy Right 04/10/2013    Procedure: RIGHT TOTAL  MASTECTOMY WITH AXILLARY SENTINEL NODE BIOPSY;  Surgeon: Adin Hector, MD;  Location: [place];  Service: General;  Laterality: Right;  Methylene Blue Injection    Prior to Admission medications   Medication Sig Start Date End Date Taking? Authorizing Provider  ALPRAZolam Duanne Moron) 0.5 MG tablet Take 1 tablet (0.5 mg total) by mouth 3 (three) times daily as needed for anxiety. 02/20/15  Yes Baird Cancer, PA-C  calcium-vitamin D (OSCAL WITH D) 500-200 MG-UNIT per tablet Take 2 tablets by mouth 2 (two) times daily. 01/17/15  Yes Shanker  Kristeen Mans, MD  lidocaine-prilocaine (EMLA) cream Apply 1 application topically as needed. 06/22/14  Yes Laurie Panda, NP  ondansetron (ZOFRAN-ODT) 8 MG disintegrating tablet Take 1 tablet (8 mg total) by mouth every 8 (eight) hours as needed for nausea or vomiting. 02/20/15  Yes Baird Cancer, PA-C  oxyCODONE (OXY IR/ROXICODONE) 5 MG immediate release tablet Take 5 mg by mouth daily as needed for severe pain.  01/25/15  Yes Historical Provider, MD  polyethylene glycol (MIRALAX / GLYCOLAX) packet Take 17 g by mouth daily as needed for mild constipation. 01/17/15  Yes Shanker Kristeen Mans, MD  potassium chloride SA (K-DUR,KLOR-CON) 20 MEQ tablet Take 20-40 mEq by  mouth 2 (two) times daily. Take 2 tablets in the morning and 1 in the evening 03/01/15  Yes Historical Provider, MD  promethazine (PHENERGAN) 25 MG tablet Take 1 tablet (25 mg total) by mouth every 6 (six) hours as needed for nausea or vomiting. 01/22/15  Yes Patrici Ranks, MD  senna (SENOKOT) 8.6 MG TABS tablet Take 17.2-25.8 tablets by mouth daily as needed for mild constipation or moderate constipation.   Yes Historical Provider, MD  escitalopram (LEXAPRO) 10 MG tablet Take 1 tablet (10 mg total) by mouth daily. Patient not taking: Reported on 03/05/2015 02/20/15   Baird Cancer, PA-C  pantoprazole (PROTONIX) 40 MG tablet Take 1 tablet (40 mg total) by mouth 2 (two) times daily. 03/05/15   Debbe Odea, MD    Current Facility-Administered Medications  Medication Dose Route Frequency Provider Last Rate Last Dose  . 0.9 % NaCl with KCl 40 mEq / L  infusion   Intravenous Continuous Debbe Odea, MD 125 mL/hr at 03/06/15 0306 125 mL/hr at 03/06/15 0306  . ALPRAZolam (XANAX) tablet 0.5 mg  0.5 mg Oral TID PRN Debbe Odea, MD      . enoxaparin (LOVENOX) injection 40 mg  40 mg Subcutaneous Q24H Debbe Odea, MD   40 mg at 03/05/15 1800  . escitalopram (LEXAPRO) tablet 10 mg  10 mg Oral Daily Debbe Odea, MD   10 mg at 03/06/15 0927  . feeding supplement (BOOST / RESOURCE BREEZE) liquid 1 Container  1 Container Oral TID BM Debbe Odea, MD   1 Container at 03/06/15 [phone]  . HYDROmorphone (DILAUDID) injection 1 mg  1 mg Intravenous Once Lacretia Leigh, MD   1 mg at 03/05/15 1640  . morphine 2 MG/ML injection 2 mg  2 mg Intravenous Q4H PRN Debbe Odea, MD      . ondansetron (ZOFRAN) tablet 4 mg  4 mg Oral Q6H PRN Debbe Odea, MD   4 mg at 03/06/15 1005   Or  . ondansetron (ZOFRAN) injection 4 mg  4 mg Intravenous Q6H PRN Debbe Odea, MD   4 mg at 03/06/15 0301  . oxyCODONE (Oxy IR/ROXICODONE) immediate release tablet 5 mg  5 mg Oral Q4H PRN Debbe Odea, MD   5 mg at 03/06/15 1005  . pantoprazole  (PROTONIX) injection 40 mg  40 mg Intravenous Q12H Debbe Odea, MD   40 mg at 03/06/15 0928  . polyethylene glycol (MIRALAX / GLYCOLAX) packet 17 g  17 g Oral Daily PRN Debbe Odea, MD      . potassium chloride SA (K-DUR,KLOR-CON) CR tablet 40 mEq  40 mEq Oral BID Nita Sells, MD   40 mEq at 03/06/15 1005  . senna (SENOKOT) tablet 8.6-17.2 mg  1-2 tablet Oral Daily PRN Debbe Odea, MD      . sodium chloride 0.9 %  injection 10-40 mL  10-40 mL Intracatheter Q12H Nita Sells, MD      . sodium chloride 0.9 % injection 10-40 mL  10-40 mL Intracatheter PRN Nita Sells, MD        Allergies as of 03/05/2015 - Review Complete 03/05/2015  Allergen Reaction Noted  . Metronidazole Hives and Itching   . Sulfonamide derivatives Hives, Itching, and Rash     Family History  Problem Relation Age of Onset  . Hypertension Mother   . Diabetes Mother     MI  . Heart disease Mother   . Hypertension Sister     x4 only one HTN and one thats DI  . Hypertension Brother     x4 only 1 htn & dm  . Heart disease Father   . Breast cancer Maternal Aunt     diagnosed in her 33s  . Cancer Maternal Aunt     unknown  . Breast cancer Paternal Aunt     diagnosed in her 14s  . Cervical cancer Sister 54  . Breast cancer Maternal Aunt     diagnosed in her 50s  . Breast cancer Cousin     3 maternal cousins - 2 in their 60s, 1 in her 37s    History   Social History  . Marital Status: Married    Spouse Name: 2  . Number of Children: N/A  . Years of Education: N/A   Occupational History  . Nursing Assistant at [employer]  . Smoking status: Never Smoker   . Smokeless tobacco: Never Used  . Alcohol Use: No  . Drug Use: No  . Sexual Activity: Not Currently    Birth Control/ Protection: Surgical     Comment: menarche age 1, P41   Other Topics Concern  . Not on file   Social History Narrative    Review of Systems: ROS Dr. Wynelle Cleveland 03/05/15 reviewed  and I agree.  Physical Exam: Vital signs in last 24 hours: Temp:  [97.8 F (36.6 C)-98.4 F (36.9 C)] 97.8 F (36.6 C) (07/20 1438) Pulse Rate:  [76-109] 76 (07/20 1438) Resp:  [16-20] 16 (07/20 1438) BP: (91-112)/(42-60) 92/52 mmHg (07/20 1438) SpO2:  [100 %] 100 % (07/20 1438) Last BM Date: 03/04/15 General:   Chronically ill-appearing, uncomfortable-appearing but not acutely toxic Head:  Normocephalic and atraumatic. Hair loss post-chemotherapy Eyes:  Sclera clear, faint scleral icterus.   Conjunctiva pink. Ears:  Normal auditory acuity. Nose:  No deformity, discharge,  or lesions. Mouth:  No deformity or lesions.  Oropharynx dry Neck:  Supple; no masses or thyromegaly. Lungs:  Clear throughout to auscultation.   No wheezes, crackles, or rhonchi. No acute distress. Heart:  Regular rate and rhythm; no murmurs, clicks, rubs,  or gallops. Abdomen:  Soft,generalized tenderness to soft touch, worse left periumbilical and left upper quadrant. No masses, hepatosplenomegaly or hernias noted. Normal bowel sounds; voluntary guarding; no peritonitis.     Msk:  Symmetrical without gross deformities. Normal posture. Pulses:  Normal pulses noted. Extremities:  Without clubbing or edema. Neurologic:  Alert and  oriented x4;  grossly normal neurologically. Skin:  Intact without significant lesions or rashes. Psych:  Alert and cooperative. Depressed mood, flat affect   Lab Results:  Recent Labs  03/05/15 1303 03/06/15 0400  WBC 7.9 8.3  HGB 12.3 11.2*  HCT 36.4 33.9*  PLT 184 194   BMET  Recent Labs  03/05/15 1303 03/06/15 0400  NA 131* 136  K 2.4* 2.9*  CL 85* 94*  CO2 32 30  GLUCOSE 147* 158*  BUN 12 7  CREATININE 0.87 0.81  CALCIUM 6.8* 6.6*   LFT  Recent Labs  03/05/15 1303  PROT 6.0*  ALBUMIN 2.9*  AST 104*  ALT 64*  ALKPHOS 136*  BILITOT 2.0*   PT/INR No results for input(s): LABPROT, INR in the last 72 hours.  Studies/Results: Ct Abdomen Pelvis W  Contrast  03/05/2015   CLINICAL DATA:  Right lateral abdominal pain for 2 months. History of breast cancer diagnosed in 2014 with prior chemotherapy and radiation.  EXAM: CT ABDOMEN AND PELVIS WITH CONTRAST  TECHNIQUE: Multidetector CT imaging of the abdomen and pelvis was performed using the standard protocol following bolus administration of intravenous contrast.  CONTRAST:  120mL OMNIPAQUE IOHEXOL 300 MG/ML  SOLN  COMPARISON:  CT 12/03/2014  FINDINGS: No confluent airspace opacities in the lung bases. No effusions. Heart is normal size.  Marked worsening of metastatic disease within the liver with diffuse innumerable rim enhancing lesions now present. Heterogeneous appearance of the spleen with tiny hypodensities again noted, similar to prior study. This could reflect sarcoidosis or amyloid. Metastases are possible. Gallbladder, pancreas, adrenals, kidneys are unremarkable.  Stomach, large and small bowel are unremarkable. Aorta is normal caliber. No free fluid or free air. Left periaortic adenopathy has improved. Short axis diameter of a left periaortic lymph node measures 8 mm on today's study compared with 14 mm previously. Right periaortic and retrocaval lymph nodes of also improved slightly.  Aorta is normal caliber. Uterus, adnexae and urinary bladder are unremarkable.  Areas of sclerosis within the iliac bones bilaterally, left greater than right are stable since prior study.  IMPRESSION: Marked worsening of hepatic metastatic disease with innumerable diffuse rim enhancing lesions throughout the liver.  Stable tiny hypodensities scattered throughout the spleen which could reflect infiltrating process such as sarcoidosis, amyloid, or metastatic disease.  Improving retroperitoneal adenopathy.   Electronically Signed   By: Rolm Baptise M.D.   On: 03/05/2015 13:59   Impression:  1.  Left-sided near-constant severe abdominal pain, worse after eating.   2.  Nausea and vomiting. 3.  Weight loss. 4.   Progressive metastatic breast cancer.  Plan:  1.  UGI series for further evaluation. 2.  Continue analgesics, antiemetics and supportive care otherwise. 3.  Might consider endoscopy +/- abdominal ultrasound pending results of UGI series. 4.  Will follow.   LOS: 1 day   Dierdra Salameh M  03/06/2015, 3:55 PM  Pager [phone] If no answer or after 5 PM call [phone]

## 2015-03-06 NOTE — Progress Notes (Signed)
[facility]  Patient Status: Active (receiving services up to time of hospitalization)  [facility] is providing the following services: RN and PT  If patient discharges after hours, please call [phone].   Lurlean Leyden 03/06/2015, 3:49 PM

## 2015-03-06 NOTE — Progress Notes (Signed)
Brenda Avery ENI:[id number] DOB: 1971/03/08 DOA: 03/05/2015 PCP: Molli Hazard, MD  Brief narrative: 44 y/o ? history of metastatic breast cancer not currently on chemotherapy  secondary to significant metastatic disease as well as intolerance treatment  she has fentanyl patches ordered but declined taking them as they're making her sleepy and removed them a couple of days ago   she was found to be profoundly hypokalemic and having significant abdominal pain and was admitted to the emergency room   Past medical history-As per Problem list Chart reviewed as below-  reviewed  Consultants:  v none  Procedures:  None currently  Antibiotics:  None currently   Subjective  Sitting in bed and seemingly not too comfortable Tolerating clears but not eating No diarrhea no chest pain no blurred vision no double vision no shortness of breath Multiple family members at the bedside States abdominal pain is in the left upper quadrant however not really relieved by fentanyl Had 2 episodes of vomiting since admission but feels okay now   Objective    Interim History:   Telemetry:    Objective: Filed Vitals:   03/05/15 1617 03/05/15 2042 03/06/15 0621 03/06/15 1438  BP: 92/42 112/60 91/59 92/52   Pulse: 91 109 99 76  Temp:  98.4 F (36.9 C) 98.2 F (36.8 C) 97.8 F (36.6 C)  TempSrc:  Oral Oral Oral  Resp: 16 20 16 16   Height:      Weight:      SpO2: 100% 100% 100% 100%    Intake/Output Summary (Last 24 hours) at 03/06/15 1541 Last data filed at 03/06/15 1442  Gross per 24 hour  Intake 2912.5 ml  Output   1700 ml  Net 1212.5 ml    Exam:  General: Alert cachectic not in apparent distress, alopecia noted Cardiovascular: S1-S2 no murmur rub or gallop, port is noted in the right side of chest Respiratory: Clinically clear no added sound Abdomen: Soft slightly tender left upper quadrant, no epigastric tenderness, clinically do not appreciate shifting  dullness Skin no lower extremity edema Neuro grossly intact  Data Reviewed: Basic Metabolic Panel:  Recent Labs Lab 02/28/15 1330 03/05/15 1303 03/05/15 1700 03/06/15 0400  NA 131* 131*  --  136  K 2.6* 2.4*  --  2.9*  CL 84* 85*  --  94*  CO2 31 32  --  30  GLUCOSE 156* 147*  --  158*  BUN 19 12  --  7  CREATININE 1.15* 0.87  --  0.81  CALCIUM 7.0* 6.8*  --  6.6*  MG 2.2  --  1.6*  --    Liver Function Tests:  Recent Labs Lab 02/28/15 1330 03/05/15 1303  AST 72* 104*  ALT 56* 64*  ALKPHOS 155* 136*  BILITOT 2.2* 2.0*  PROT 6.8 6.0*  ALBUMIN 3.1* 2.9*    Recent Labs Lab 03/05/15 1303  LIPASE 341*   No results for input(s): AMMONIA in the last 168 hours. CBC:  Recent Labs Lab 02/28/15 1330 03/05/15 1303 03/06/15 0400  WBC 8.7 7.9 8.3  NEUTROABS 6.7 5.8  --   HGB 13.5 12.3 11.2*  HCT 39.6 36.4 33.9*  MCV 79.5 80.2 80.0  PLT 188 184 194   Cardiac Enzymes: No results for input(s): CKTOTAL, CKMB, CKMBINDEX, TROPONINI in the last 168 hours. BNP: Invalid input(s): POCBNP CBG:  Recent Labs Lab 03/05/15 1918  GLUCAP 282*    No results found for this or any previous visit (from the past 240 hour(s)).  Studies:              All Imaging reviewed and is as per above notation   Scheduled Meds: . enoxaparin (LOVENOX) injection  40 mg Subcutaneous Q24H  . escitalopram  10 mg Oral Daily  . feeding supplement  1 Container Oral TID BM  . HYDROmorphone  1 mg Intravenous Once  . pantoprazole (PROTONIX) IV  40 mg Intravenous Q12H  . potassium chloride  40 mEq Oral BID  . sodium chloride  10-40 mL Intracatheter Q12H   Continuous Infusions: . 0.9 % NaCl with KCl 40 mEq / L 125 mL/hr (03/06/15 0306)     Assessment/Plan: 1. Profound hypokalemia-unclear etiology. Potentially secondary to nausea vomiting. Could work up academically with urinary potassium however I do not think this would change the end point that patient needs to be on chronic relatively  high-dose replacement. She is currently getting sodium chloride with KCl 40 mEq 1 25 cc an hour and potassium chloride orally twice a day. If this persists we will ask nephrology for an opinion. I would check a magnesium given her poor nutritional state as well as a calcium.   2. Metastatic breast cancer-hospice eligible however not willing to make that decision as yet. She should follow-up with her oncologist for further discussion about this versus her primary care physician. Pain management is challenging as patient does not wish to use opiates however in the setting of advanced cancer there may be a real need.  It looks like she has had discussions as an inpatient with Dr. Hilma Favors of palliative care and these may need to be readdressed later on this admission in terms of what her goals are. Her issues are complicated by the fact that she is relatively young [44 years old] and has 2 young children which may be the reason why she does not seem to want to consider hospice and palliative care. We will ask palliative social worker to see her. 3. Abdominal pain and nausea vomiting-appreciate gastroenterology Dr. Paulita Fujita seeing the patient this morning. Apparently the patient is supposed to get a barium swallow/EGD in the next day or so?. I do not think that her lipase or CT scan have contributed anything to her understanding so yes it may be reasonable to get an EGD-I suspect that S is all from metastatic spread of her cancer in terms of dysphagia 4. Diabetes mellitus type 2-blood sugars attain 147 and 282.  Code Status: Full CODE STATUS Family Communication: Multiple family members including mother in law at the bedside giving support. I discussed with husband on telephone as well Disposition Plan: Inpatient telemetry   Verneita Griffes, MD  [facility] Pager [phone] 03/06/2015, 3:41 PM    LOS: 1 day

## 2015-03-07 ENCOUNTER — Inpatient Hospital Stay (HOSPITAL_COMMUNITY): Payer: BLUE CROSS/BLUE SHIELD

## 2015-03-07 ENCOUNTER — Encounter (HOSPITAL_COMMUNITY): Payer: Self-pay | Admitting: *Deleted

## 2015-03-07 LAB — GLUCOSE, CAPILLARY
GLUCOSE-CAPILLARY: 174 mg/dL — AB (ref 65–99)
GLUCOSE-CAPILLARY: 137 mg/dL — AB (ref 65–99)
Glucose-Capillary: 158 mg/dL — ABNORMAL HIGH (ref 65–99)
Glucose-Capillary: 159 mg/dL — ABNORMAL HIGH (ref 65–99)
Glucose-Capillary: 163 mg/dL — ABNORMAL HIGH (ref 65–99)

## 2015-03-07 LAB — BASIC METABOLIC PANEL
Anion gap: 8 (ref 5–15)
CO2: 27 mmol/L (ref 22–32)
Calcium: 6.6 mg/dL — ABNORMAL LOW (ref 8.9–10.3)
Chloride: 109 mmol/L (ref 101–111)
Creatinine, Ser: 0.72 mg/dL (ref 0.44–1.00)
GFR calc non Af Amer: >60 mL/min (ref >60)
GLUCOSE: 172 mg/dL — AB (ref 65–99)
POTASSIUM: 3.5 mmol/L (ref 3.5–5.1)
Sodium: 144 mmol/L (ref 135–145)

## 2015-03-07 MED ORDER — POTASSIUM CHLORIDE CRYS ER 20 MEQ PO TBCR: 40.0000 meq | EXTENDED_RELEASE_TABLET | Freq: Three times a day (TID) | ORAL | Status: AC

## 2015-03-07 MED ORDER — ZOLPIDEM TARTRATE 5 MG PO TABS
5.0000 mg | ORAL_TABLET | Freq: Once | ORAL | Status: AC
  Administered 2015-03-07: 5 mg via ORAL
  Filled 2015-03-07: qty 1

## 2015-03-07 MED ORDER — ONDANSETRON 8 MG PO TBDP: 8.0000 mg | ORAL_TABLET | Freq: Three times a day (TID) | ORAL | Status: AC | PRN

## 2015-03-07 NOTE — Clinical Documentation Improvement (Signed)
Possible Clinical Conditions?  Severe Malnutrition   Protein Calorie Malnutrition Severe Protein Calorie Malnutrition Emaciation  Cachexia    Other Condition Cannot clinically determine  Supporting Information: RD assessment: "Severe malnutrition in context of chronic illness" Risk Factors: Metastatic breast cancer Signs & Symptoms: Cachectic   Thank you, Carrolyn Meiers, RN [email or web address] [phone]

## 2015-03-07 NOTE — Progress Notes (Signed)
Severe generalized abdominal pain, now left- and right-sided.  UGI series unrevealing.  I suspect pain is due to cancer progression, especially hepatic capsular stretch from significant increase in hepatic metastatic disease burden.  Suggest supportive care, hospice involvement.  Don't see role for endoscopy or any other GI tract testing.  Reviewed with Dr. Verlon Au.  Will sign-off; please call with questions; thank you for the consultation.

## 2015-03-07 NOTE — Progress Notes (Signed)
The prior note written to [facility] was physically signed for Dr. Whitney Muse and then faxed to [phone].

## 2015-03-07 NOTE — Progress Notes (Unsigned)
[facility]:  Brenda Avery needs a wheelchair due to her inability to ambulate. Patient requires a 2 person assist with ambulation due to extreme weakness/fatigue/safety concerns. She can not ambulate with a cane/crutches/walekr. Caregivers are in the home to assist Brenda Avery with the usage of the wheelchair. Patients ht is 63 inches and wt is 150 lbs.  Brenda Avery needs a semi-electric bed because she can not reposition herself without assistance due to extreme weakness/fatigue/abdominal pain. It is our recommendation that Brenda Avery receive a semi-electric bed. Patient may change position in bed as she deems necessary to promote comfort and rest.   Brenda Avery needs a gel overlay because of her decreased mobility and impaired nutritional status.   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   If you have any further questions or concerns please contact Lupita Raider RN with [facility] @ [phone].   Thanks,     Ancil Linsey MD

## 2015-03-07 NOTE — Discharge Summary (Signed)
Physician Discharge Summary  Brenda Avery CMK:[id number] DOB: 1971/07/15 DOA: 03/05/2015  PCP: Molli Hazard, MD  Admit date: 03/05/2015 Discharge date: 03/07/2015  Time spent: 45 minutes  Recommendations for Outpatient Follow-up:   1. Recommend Dr. Whitney Muse discuss further with patient Hospice options 2. 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 history of metastatic breast cancer not currently on chemotherapy secondary to significant metastatic disease as well as intolerance treatment she has fentanyl patches ordered but declined taking them as they're making her sleepy and removed them a couple of days ago  she was found to be profoundly hypokalemic and having significant abdominal pain and was admitted to the emergency room  Her hyperkalemia was aggressively treated with IV potassium replacement as well as ODT Zofran A work-up of Abd pain was requested by the patient and family in terms of why the patient was having such severe bd pain and GI Dr. Paulita Fujita saw the patient and recommended a barium swallow which was non-revealing I had a long discussion with her and her sister and husband and it appears they just want to take her home I attempted to broach the topic of Hospice and family and patient do not appear to be ready for this discussion, despite 3 independent specialities [IM, GI and Oncology] weighing in and telling her that her options for cure are negligible I will call Dr. Whitney Muse personally to update her of this Patient is at a very high risk for re-admission   Consultations:    GI  Oncology  Discharge Exam: Filed Vitals:   03/07/15 0405  BP: 113/71  Pulse: 95  Temp: 97.8 F (36.6 C)  Resp: 16    General: eomi ncat  Discharge Instructions    Current Discharge Medication List    CONTINUE these medications which have CHANGED   Details  ondansetron (ZOFRAN-ODT) 8 MG disintegrating tablet Take 1 tablet (8 mg total) by mouth every 8 (eight) hours as needed for nausea or vomiting. 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 Qty: 30 tablet, Refills: 0    potassium chloride SA (K-DUR,KLOR-CON) 20 MEQ tablet Take 2 tablets (40 mEq total) by mouth 3 (three) times daily. Qty: 60 tablet, Refills: 0      CONTINUE these medications which have NOT CHANGED   Details  ALPRAZolam (XANAX) 0.5 MG tablet Take 1 tablet (0.5 mg total) by mouth 3 (three) times daily as needed for anxiety. Qty: 60 tablet, Refills: 1   Associated Diagnoses: Anxiety    calcium-vitamin D (OSCAL WITH D) 500-200 MG-UNIT per tablet Take 2 tablets by mouth 2 (two) times daily. Qty: 90 tablet, Refills: 0    lidocaine-prilocaine (EMLA) cream Apply 1 application topically as needed. Qty: 30 g, Refills: 0    oxyCODONE (OXY IR/ROXICODONE) 5 MG immediate release tablet Take 5 mg by mouth daily as needed for severe pain.  Refills: 0    polyethylene glycol (MIRALAX / GLYCOLAX)  packet Take 17 g by mouth daily as needed for mild constipation. Qty: 14 each, Refills: 0    promethazine (PHENERGAN) 25 MG tablet Take 1 tablet (25 mg total) by mouth every 6 (six) hours as needed for nausea or vomiting. 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     escitalopram (LEXAPRO) 10 MG tablet Take 1 tablet (10 mg total) by mouth daily. Qty: 30 tablet, Refills: 2   Associated Diagnoses: Depression       Allergies  Allergen Reactions  . Metronidazole Hives and Itching  . Sulfonamide Derivatives Hives, Itching and Rash      The results of significant diagnostics from this hospitalization (including imaging, microbiology, ancillary and laboratory) are listed below for reference.    Significant Diagnostic Studies: Ct Angio Chest Pe W/cm &/or Wo Cm  02/13/2015   CLINICAL DATA:  Chest pain, tachycardia, thoracentesis today. Bilateral leg swelling and history of breast cancer.  EXAM: CT ANGIOGRAPHY CHEST WITH CONTRAST  TECHNIQUE: Multidetector CT imaging of the chest was performed using the standard protocol during bolus administration of intravenous contrast. Multiplanar CT image reconstructions and MIPs were obtained to evaluate the vascular anatomy.  CONTRAST:  135mL OMNIPAQUE IOHEXOL 350 MG/ML SOLN  COMPARISON:  12/03/2014 and PET 09/03/2014.  FINDINGS: Mediastinum/Nodes: Negative for pulmonary embolus. Right IJ Port-A-Cath terminates in the right atrium. Mediastinal lymph nodes measure up to 9 mm in the prevascular space (previously 7 mm). No hilar, axillary or internal mammary lymph nodes. Bilateral axillary surgical clips. Heart size normal. Small amount of anterior pericardial fluid is new.  Lungs/Pleura: Unit small right pleural effusion with compressive atelectasis in the right lower lobe. Subpleural densities in the anterior left upper lobe may be due to radiation therapy, stable. Airway is unremarkable.  Upper abdomen: Liver is nearly completely replaced with low-attenuation lesions, progressive from 12/03/2014. Index lesion in the left hepatic lobe measures approximately 3.1 cm (series 4, image 84), previously 2.2 cm. Visualized portions of the adrenal glands and left kidney are unremarkable. Spleen is heterogeneous and somewhat nodular in  appearance, as before. Visualized portions of the pancreas and stomach are grossly unremarkable. Upper abdominal lymph nodes measure up to approximately 12 mm.  Musculoskeletal: Mixed lytic and sclerotic lesion in the manubrium is again noted. Sclerosis in the sternum is also noted.  Review of the MIP images confirms the above findings.  IMPRESSION: 1. Negative for pulmonary embolus. 2. Mediastinal lymph nodes appear slightly larger. 3. Progressive hepatic metastatic disease. 4. Small amount of anterior pericardial fluid is new. 5. Small right pleural effusion with compressive atelectasis in the right lower lobe. 6. Osseous metastatic disease is grossly stable.   Electronically Signed   By: Lorin Picket M.D.   On: 02/13/2015 15:19   [place] Chest  02/13/2015   CLINICAL DATA:  RIGHT pleural effusion, for thoracentesis  EXAM: CHEST ULTRASOUND  COMPARISON:  Chest radiograph 02/12/2015  FINDINGS: Sonography of the RIGHT chest performed.  Only a small RIGHT pleural effusion is identified, insufficient for expected therapeutic benefit.  Thoracentesis not performed.  IMPRESSION: Only a small RIGHT pleural effusion is sonographically visualized, insufficient for expected therapeutic benefit from thoracentesis.   Electronically Signed   By: Lavonia Dana M.D.   On: 02/13/2015 13:34   Ct Abdomen Pelvis W Contrast  03/05/2015  CLINICAL DATA:  Right lateral abdominal pain for 2 months. History of breast cancer diagnosed in 2014 with prior chemotherapy and radiation.  EXAM: CT ABDOMEN AND PELVIS WITH CONTRAST  TECHNIQUE: Multidetector CT imaging of the abdomen and pelvis was performed using the standard protocol following bolus administration of intravenous contrast.  CONTRAST:  136mL OMNIPAQUE IOHEXOL 300 MG/ML  SOLN  COMPARISON:  CT 12/03/2014  FINDINGS: No confluent airspace opacities in the lung bases. No effusions. Heart is normal size.  Marked worsening of metastatic disease within the liver with diffuse innumerable rim  enhancing lesions now present. Heterogeneous appearance of the spleen with tiny hypodensities again noted, similar to prior study. This could reflect sarcoidosis or amyloid. Metastases are possible. Gallbladder, pancreas, adrenals, kidneys are unremarkable.  Stomach, large and small bowel are unremarkable. Aorta is normal caliber. No free fluid or free air. Left periaortic adenopathy has improved. Short axis diameter of a left periaortic lymph node measures 8 mm on today's study compared with 14 mm previously. Right periaortic and retrocaval lymph nodes of also improved slightly.  Aorta is normal caliber. Uterus, adnexae and urinary bladder are unremarkable.  Areas of sclerosis within the iliac bones bilaterally, left greater than right are stable since prior study.  IMPRESSION: Marked worsening of hepatic metastatic disease with innumerable diffuse rim enhancing lesions throughout the liver.  Stable tiny hypodensities scattered throughout the spleen which could reflect infiltrating process such as sarcoidosis, amyloid, or metastatic disease.  Improving retroperitoneal adenopathy.   Electronically Signed   By: Rolm Baptise M.D.   On: 03/05/2015 13:59   Dg Chest Port 1 View  02/12/2015   CLINICAL DATA:  Right pleural effusion.  Shortness of breath.  EXAM: PORTABLE CHEST - 1 VIEW  COMPARISON:  02/10/2015  FINDINGS: There is a persistent small right pleural effusion. No change since the prior study. Power port in place. Left lung is clear. Heart size and vascularity are normal.  IMPRESSION: Unchanged right pleural effusion. I suspect that this is a malignant effusion or sympathetic effusions secondary to the underlying liver metastases. I doubt that the patient has an infiltrate but probably has slight compressive atelectasis of the right lower lobe.   Electronically Signed   By: Lorriane Shire M.D.   On: 02/12/2015 19:35   Dg Chest Port 1 View  02/10/2015   CLINICAL DATA:  Syncopal episode today, history of  breast cancer  EXAM: PORTABLE CHEST - 1 VIEW  COMPARISON:  09/20/2012  FINDINGS: Heart size unchanged and normal. Port-A-Cath on the right is stable. The left lung is clear.  There is opacity over the right lower lobe which appears to represent a combination of small to moderate pleural effusion and consolidation. This is new from prior studies.  IMPRESSION: New right effusion and lower lobe consolidation.   Electronically Signed   By: Skipper Cliche M.D.   On: 02/10/2015 14:27   Dg Duanne Limerick  W/kub  03/07/2015   CLINICAL DATA:  Generalized abdominal pain. Nausea and vomiting. Anorexia. Metastatic breast carcinoma. Evaluate for gastric outlet or duodenal obstruction.  EXAM: UPPER GI SERIES WITH KUB  TECHNIQUE: After obtaining a scout radiograph a routine upper GI series was performed using thin barium.  FLUOROSCOPY TIME:  Fluoroscopy Time (in minutes and seconds): 1 minutes 13 seconds  Number of Acquired Images:  8  COMPARISON:  None.  FINDINGS: The scout radiograph shows a normal bowel gas pattern. Small amount of residual contrast in the left colon from reason CT.  Single-contrast upper  GI series shows no evidence of esophageal mass or stricture. No hiatal hernia identified.  Evaluation was limited as patient could only drink a small amount of barium. The stomach was incompletely distended, but no definite masses or ulcers are identified. Prompt gastric emptying is seen. Duodenal bulb and sweep are normal in appearance.  IMPRESSION: Negative.  No evidence of gastric outlet or duodenal obstruction.   Electronically Signed   By: Earle Gell M.D.   On: 03/07/2015 13:04    Microbiology: No results found for this or any previous visit (from the past 240 hour(s)).   Labs: Basic Metabolic Panel:  Recent Labs Lab 03/05/15 1303 03/05/15 1700 03/06/15 0400 03/07/15 0745  NA 131*  --  136 144  K 2.4*  --  2.9* 3.5  CL 85*  --  94* 109  CO2 32  --  30 27  GLUCOSE 147*  --  158* 172*  BUN 12  --  7 <5*   CREATININE 0.87  --  0.81 0.72  CALCIUM 6.8*  --  6.6* 6.6*  MG  --  1.6*  --   --    Liver Function Tests:  Recent Labs Lab 03/05/15 1303  AST 104*  ALT 64*  ALKPHOS 136*  BILITOT 2.0*  PROT 6.0*  ALBUMIN 2.9*    Recent Labs Lab 03/05/15 1303  LIPASE 341*   No results for input(s): AMMONIA in the last 168 hours. CBC:  Recent Labs Lab 03/05/15 1303 03/06/15 0400  WBC 7.9 8.3  NEUTROABS 5.8  --   HGB 12.3 11.2*  HCT 36.4 33.9*  MCV 80.2 80.0  PLT 184 194   Cardiac Enzymes: No results for input(s): CKTOTAL, CKMB, CKMBINDEX, TROPONINI in the last 168 hours. BNP: BNP (last 3 results) No results for input(s): BNP in the last 8760 hours.  ProBNP (last 3 results) No results for input(s): PROBNP in the last 8760 hours.  CBG:  Recent Labs Lab 03/06/15 2036 03/07/15 0024 03/07/15 0440 03/07/15 0823 03/07/15 1233  GLUCAP 209* 163* 174* 158* 159*       Signed:  Nita Sells  [facility] 03/07/2015, 2:36 PM

## 2015-03-07 NOTE — Progress Notes (Signed)
Patient has been refusing scheduled insulin.  Will continue to monitor patient.

## 2015-03-08 ENCOUNTER — Inpatient Hospital Stay (HOSPITAL_COMMUNITY): Payer: BLUE CROSS/BLUE SHIELD

## 2015-03-08 ENCOUNTER — Ambulatory Visit (HOSPITAL_COMMUNITY): Payer: BLUE CROSS/BLUE SHIELD | Admitting: Hematology & Oncology

## 2015-03-08 LAB — GLUCOSE, CAPILLARY
GLUCOSE-CAPILLARY: 160 mg/dL — AB (ref 65–99)
GLUCOSE-CAPILLARY: 143 mg/dL — AB (ref 65–99)
Glucose-Capillary: 142 mg/dL — ABNORMAL HIGH (ref 65–99)
Glucose-Capillary: 158 mg/dL — ABNORMAL HIGH (ref 65–99)

## 2015-03-08 MED ORDER — HEPARIN SOD (PORK) LOCK FLUSH 100 UNIT/ML IV SOLN: 500.0000 [IU] | Freq: Once | INTRAVENOUS | Status: AC

## 2015-03-08 MED ORDER — HEPARIN SOD (PORK) LOCK FLUSH 100 UNIT/ML IV SOLN
500.0000 [IU] | INTRAVENOUS | Status: AC | PRN
  Administered 2015-03-08: 500 [IU]

## 2015-03-08 NOTE — Care Management Note (Signed)
Case Management Note  Patient Details  Name: Brenda Avery MRN: [id number] Date of Birth: Sep 05, 1970  Subjective/Objective:   [facility] rep Kristen aware of d/c & d/c HHC orders.                 Action/Plan:d/c home w/HHC. No further d/c needs.   Expected Discharge Date:   (unknown)               Expected Discharge Plan:  [place]  In-House Referral:     Discharge planning Services  CM Consult  Post Acute Care Choice:  Home Health Choice offered to:  Patient  DME Arranged:    DME Agency:     HH Arranged:  RN, PT, OT, Nurse's Aide [place] Agency:  [facility]  Status of Service:  Completed, signed off  Medicare Important Message Given:    Date Medicare IM Given:    Medicare IM give by:    Date Additional Medicare IM Given:    Additional Medicare Important Message give by:     If discussed at [place] of Stay Meetings, dates discussed:    Additional Comments:  Dessa Phi, RN 03/08/2015, 11:00 AM

## 2015-03-08 NOTE — Progress Notes (Signed)
No acute changes overnight from discharge summary done 03/07/15 Patient has not eaten nor drank properly and is feeling nauseous still I spent 10 minutes talking with her regarding the futility of her care and she was essentially nonverbal but we did have good prayer session today in keeping with her face. I express condolences to her husband and mentioned to them once again that I fully concur with Dr. Donald Pore plan and encouraged her and family to consider seeing Dr. Whitney Muse in the close follow-up This is very difficult case and I do not see any curative pathway for this unfortunate patient and fully support a hospice trajectory Patient was encouraged to go home on Zofran ODT as well as pain medications  Verneita Griffes, MD [facility] [phone]

## 2015-03-15 ENCOUNTER — Inpatient Hospital Stay (HOSPITAL_COMMUNITY): Payer: BLUE CROSS/BLUE SHIELD

## 2015-05-08 ENCOUNTER — Other Ambulatory Visit (HOSPITAL_COMMUNITY): Payer: Self-pay | Admitting: Oncology

## 2015-06-12 ENCOUNTER — Other Ambulatory Visit (HOSPITAL_COMMUNITY): Payer: Self-pay | Admitting: Hematology & Oncology

## 2015-06-27 ENCOUNTER — Other Ambulatory Visit: Payer: Self-pay | Admitting: Nurse Practitioner

## 2015-08-13 ENCOUNTER — Ambulatory Visit (INDEPENDENT_AMBULATORY_CARE_PROVIDER_SITE_OTHER): Payer: BLUE CROSS/BLUE SHIELD | Admitting: Internal Medicine

## 2015-08-13 ENCOUNTER — Encounter (INDEPENDENT_AMBULATORY_CARE_PROVIDER_SITE_OTHER): Payer: Self-pay | Admitting: *Deleted

## 2015-08-13 ENCOUNTER — Encounter (INDEPENDENT_AMBULATORY_CARE_PROVIDER_SITE_OTHER): Payer: Self-pay | Admitting: Internal Medicine

## 2015-08-13 VITALS — BP 128/66 | HR 112 | Temp 98.2°F | Resp 16 | Ht 63.6 in | Wt 161.3 lb

## 2015-08-13 DIAGNOSIS — Z862 Personal history of diseases of the blood and blood-forming organs and certain disorders involving the immune mechanism: Secondary | ICD-10-CM | POA: Diagnosis not present

## 2015-08-13 DIAGNOSIS — C799 Secondary malignant neoplasm of unspecified site: Secondary | ICD-10-CM | POA: Diagnosis not present

## 2015-08-13 DIAGNOSIS — C50919 Malignant neoplasm of unspecified site of unspecified female breast: Secondary | ICD-10-CM

## 2015-08-13 DIAGNOSIS — R17 Unspecified jaundice: Secondary | ICD-10-CM

## 2015-08-13 NOTE — Patient Instructions (Signed)
Physician will call with results of ultrasound and blood tests and further recommendations.

## 2015-08-13 NOTE — Progress Notes (Signed)
Reason for consultation:  Progressive jaundice in patient with known metastatic breast carcinoma.  History of present illness:  Patient is 44 year old African-American female who is referred through courtesy of Dr. Tressie Stalker for evaluation of jaundice. She is accompanied by her two sisters. She was diagnosed with breast carcinoma back in January 2014 and on workup she was noted to have metastatic disease. She has undergone multi modality treatment. Lately she's been on carboplatinum and Taxol. Dose was reduced last month because of toxicity. With this therapy her CA 27.29 has dropped from over 71,000 less than 5000. She was noted to be mildly jaundiced about 12 weeks ago. Her bilirubin on 07/26/2015 was 4.7 and 4 days ago was 16.4. She had abdominopelvic CT and ultrasound earlier this month which are reviewed below. Patient's sister first noted to be jaundiced about a month ago. She states she has become deeply jaundiced over the last week or so. She has had abdominal pain in the past but not in the last 2 weeks. She states her appetite is fair but every time she tries to eat she vomits. She has been heaving and bringing up frothy liquid. She denies heartburn dysphagia or hematemesis. She has noticed slight increase in abdominal girth lately. She has been constipated and hasn't had a bowel movement in almost a week. She denies melena or rectal bleeding. Her urine is orange and her stools are clay colored. She denies fever chills or night sweats. She has noted mild headache over the last 2 weeks. She has never been treated for sarcoidosis. She lives at home with her husband. She says her older son who is 14 years old is having very difficult time with her illness. She states she has plenty of support from her family members and particularly her sisters.    Current Medications: Outpatient Encounter Prescriptions as of 08/13/2015  Medication Sig  . calcium-vitamin D (OSCAL WITH D) 500-200 MG-UNIT  per tablet Take 2 tablets by mouth 2 (two) times daily.  Marland Kitchen lidocaine (XYLOCAINE) 2 % solution Use as directed 20 mLs in the mouth or throat as needed.   . lidocaine-prilocaine (EMLA) cream Apply 1 application topically as needed.  . magnesium hydroxide (MILK OF MAGNESIA) 400 MG/5ML suspension Take 30 mLs by mouth daily as needed for mild constipation.  . ondansetron (ZOFRAN-ODT) 8 MG disintegrating tablet Take 1 tablet (8 mg total) by mouth every 8 (eight) hours as needed for nausea or vomiting.  Marland Kitchen oxyCODONE (OXY IR/ROXICODONE) 5 MG immediate release tablet Take 5 mg by mouth daily as needed for severe pain.   Marland Kitchen oxyCODONE (OXYCONTIN) 10 mg 12 hr tablet Take 10 mg by mouth every 12 (twelve) hours.   . pantoprazole (PROTONIX) 40 MG tablet Take 1 tablet (40 mg total) by mouth 2 (two) times daily.  . polyethylene glycol (MIRALAX / GLYCOLAX) packet Take 17 g by mouth daily as needed for mild constipation.  . potassium chloride SA (K-DUR,KLOR-CON) 20 MEQ tablet Take 2 tablets (40 mEq total) by mouth 3 (three) times daily.  . promethazine (PHENERGAN) 25 MG tablet Take 1 tablet (25 mg total) by mouth every 6 (six) hours as needed for nausea or vomiting.  . [DISCONTINUED] chlorpheniramine-HYDROcodone ([place]) 10-8 MG/5ML SUER   . ALPRAZolam (XANAX) 0.5 MG tablet Take 1 tablet (0.5 mg total) by mouth 3 (three) times daily as needed for anxiety. (Patient not taking: Reported on 08/13/2015)  . escitalopram (LEXAPRO) 10 MG tablet Take 1 tablet (10 mg total) by mouth daily. (Patient not  taking: Reported on 03/05/2015)  . heparin lock flush 100 UNIT/ML SOLN injection 5 mLs (500 Units total) by Intracatheter route once. (Patient not taking: Reported on 08/13/2015)  . senna (SENOKOT) 8.6 MG TABS tablet Take 17.2-25.8 tablets by mouth daily as needed for mild constipation or moderate constipation. Reported on 08/13/2015   No facility-administered encounter medications on file as of 08/13/2015.   Past Medical  History  Diagnosis Date  . Hyperlipidemia     diet therapy per patient request  . 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 Hematuria 2000/2001    evaluted in [place] with negative results  . S/P radiation therapy  05/10/2013-06/23/2013    Left chest wall / 50.4 Gray @ 1.8 Gray per fraction x 28 fractions/ Left Supraclavicular fossa / 45 Gray @1 .8 Gray per fraction x 25 fractions/ Left PAB / 45 Gy at 1.8 Gray per fraction x 25 fractions/Left scar / 10 Gray at [employer] per fraction x 5 fractions     . Status post chemotherapy      Radiation Therapy Radiosensitizing Xeloda  . Status post chemotherapy  09/26/2012 - 11/07/2012      Neoadjuvant chemotherapy consisting of 4 cycles of a.c. From 09/26/2012 through 11/07/2012 she then received 12 weeks of paclitaxel.  . Use of tamoxifen (Nolvadex) started 06/26/13  . Type II diabetes mellitus (HCC)     diet therapy   . Anxiety 02/20/2015  . Depression 02/20/2015  . 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   Past Surgical History  Procedure Laterality Date  . Rectocele repair  2004  . Cystoscopy  2001  . Nevus excision  2004    facial  . Portacath placement  09/20/2012    Procedure: INSERTION PORT-A-CATH;  Surgeon: Adin Hector, MD;  Location: [place];  Service: General;  Laterality: N/A;  port a cath insertion with flouro and ultrasound   . Mastectomy Right 04/10/2013  . Mastectomy modified radical Left 04/10/2013  . Axillary sentinel node biopsy Right 04/10/2013  . Liver biopsy    . Breast biopsy Left 2014  . Tubal ligation  2004  . Mastectomy modified radical Left 04/10/2013    Procedure: LEFT MODIFIED RADICAL MASTECTOMY ;  Surgeon: Adin Hector, MD;  Location: [place];  Service: General;  Laterality: Left;  . Simple mastectomy with axillary sentinel node biopsy Right 04/10/2013    Procedure: RIGHT TOTAL  MASTECTOMY WITH AXILLARY  SENTINEL NODE BIOPSY;  Surgeon: Adin Hector, MD;  Location: [place];  Service: General;  Laterality: Right;  Methylene Blue Injection   Allergies: Allergies  Allergen Reactions  . Metronidazole Hives and Itching  . Sulfonamide Derivatives Hives, Itching and Rash   Family history: Mother died of heart problems at age 69. Health status of father unknown. 3 brothers living. One brother died of heart problems in his 22s. She has 4 sisters in good health. Family history significant for breast carcinoma and maternal aunt and her first cousin recently died of breast carcinoma at age 37.  Social history: She is married and has 2 sons ages 58 and 21 years. Patient worked at [employer] for 10 years but now she is disabled. She has never smoked cigarettes or drank alcohol.    Physical examination: Blood pressure 128/66, pulse 112, temperature 98.2 F (36.8 C), temperature source Oral,  resp. rate 16, height 5' 3.6" (1.615 m), weight 161 lb 4.8 oz (73.165 kg). Patient is alert and appears to be ill. She vomited frothy liquid few times. Patient is deeply jaundiced. She does not have asterixis. Conjunctiva is pink. Sclera is icteric Oropharyngeal mucosa is normal. No neck masses or thyromegaly noted. Cardiac exam with regular rhythm normal S1 and S2. No murmur or gallop noted. Lungs are clear to auscultation. Abdomen is full. Bowel sounds are normal. On palpation abdomen is soft without tenderness masses or organomegaly. Trace edema around ankles.  Labs/studies Results: Lab data from 05/21/2015  Bilirubin 1.2, AP 233, AST 113.9 and ALT 58, total protein 6.0 and albumin 2.6. BUN 5, creatinine 0.5 and calcium 9.7. WBC 6.0, H&H 11.7 and 35.9 and platelet count 139K.  Lab data from 06/14/2015 Bilirubin 2.6, AP 161, AST 37.9, albumin 2.45 and serum calcium 9.1.   Lab data from 07/10/2015 Bilirubin 3.0, AP 161, AST 55.1, ALT 39, total protein 5.4 and albumin 2.33.   Lab data from  07/17/2015 Bilirubin 3.4, AP 218, AST 72, ALT early and albumin 2.71.  Lab data from 07/26/2015 Bilirubin 4.7, AP 282, AST 19.4, ALT 59 and albumin 2.5.  Lab data from 07/26/2015 WBC 7.5, H&H 11.7 and 36.6 and platelet count 105K Total bilirubin 4.7, AP 282, AST 90.4, ALT 59, and albumin 2.5. Serum calcium 9.2.  Lab data from 08/09/2015 WBC 5.6, H&H 11.6 and 32.6 and platelet count 120K Bilirubin 16.4, AP 223, AST 174.1, ALT 72 and serum albumin 2.4. Serum calcium 7.8. INR 1.5.  Labs pending: ANA SMA Hepatitis a IgM antibody Hepatitis B surface antigen. Hepatitis B core antibody IgM HCV antibody  Ultrasound and 07/26/2015 Gallbladder wall is thickened CBD nondilated. Heterogeneous hepatic echotexture noted consistent with previously identified metastatic disease. Mild ascites.  Abdominopelvic CT films from 07/22/2015 reviewed Widespread hepatic metastatic disease but upper abdominal adenopathy has improved. Gallbladder wall thickening but no evidence of dilated biliary system.  CA 27.29   13979.0 U/mL  on 05/21/2015 CA  27.29   7413.3  U/mL  on 06/14/2015 CA  27.29   4290.4   U/mL  on 07/26/2015 CA  27.29    pending from     08/08/2015.  ACE level was 251 on 01/16/2015  Assessment:  Patient is 44 year old African-American female with known history of metastatic breast carcinoma who has received multiple treatment modalities including chemotherapy surgery radiation therapy and hormonal therapy currently on reduced doses of carboplatinum and Taxol presents with progressive jaundice. Bilirubin has almost quadrupled in 2 weeks. Abdominal CT and ultrasound earlier this month for negative for biliary dilation and revealed diffuse metastatic disease. Patient's CA 27.29 has decreased from almost 14,000 on 05/21/2015 to 4290 over 2 weeks ago. Differential diagnoses includes cholestasis secondary to metastatic disease, drug hepatotoxicity as well as hepatic sarcoidosis. Biliary  obstruction is less likely but needs to be ruled out with follow-up imaging study. If she does not have evidence of viral hepatitis or bile duct obstruction she should be empirically treated for hepatic sarcoidosis. Patient also complains of hepatic and may benefit from head CT but I would leave it up to Dr. Tressie Stalker.   Recommendations:  Upper abdominal ultrasound with attention to liver and biliary system. ACE level. If ultrasound is negative for biliary obstruction would consider empiric therapy for hepatic sarcoidosis.  Findings and recommendations were discussed with Dr. Tressie Stalker over the phone

## 2015-08-14 ENCOUNTER — Other Ambulatory Visit: Payer: Self-pay | Admitting: Nurse Practitioner

## 2015-09-13 ENCOUNTER — Encounter (INDEPENDENT_AMBULATORY_CARE_PROVIDER_SITE_OTHER): Payer: Self-pay

## 2015-09-18 DEATH — deceased

## 2016-04-07 IMAGING — CR DG LUMBAR SPINE COMPLETE 4+V
5 series · 5 of 5 positions shown · non-contrast
Comparison: Sagittal view of the lumbar spine 12/03/2014.

CLINICAL DATA: Low back pain starting today, no trauma, history of
breast cancer

EXAM:
LUMBAR SPINE - COMPLETE 4+ VIEW

[t lumbar spine ap]
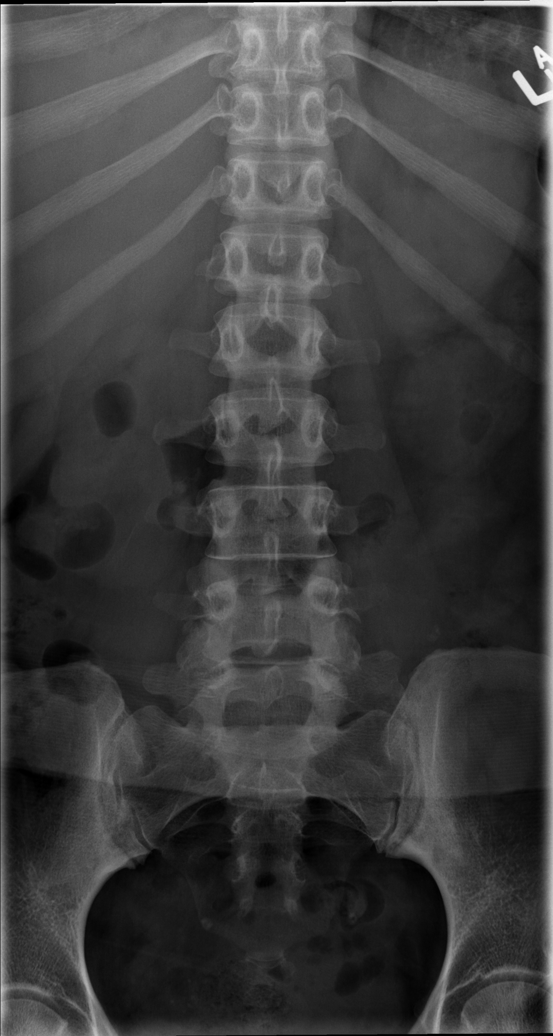

[t lumbar spine obl (1 of 2)]
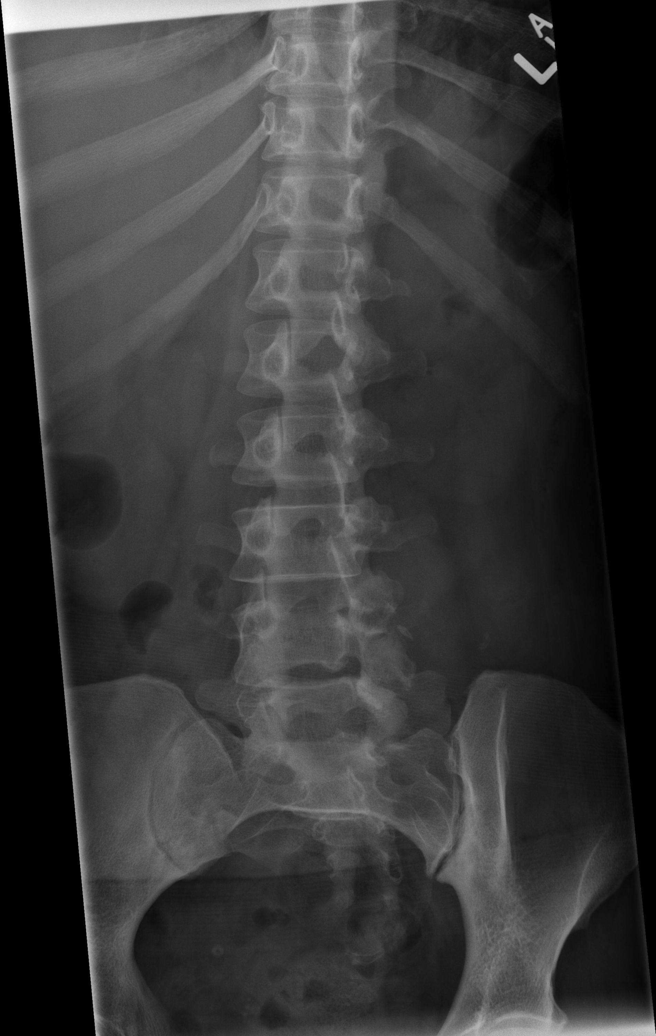

[t lumbar spine obl (2 of 2)]
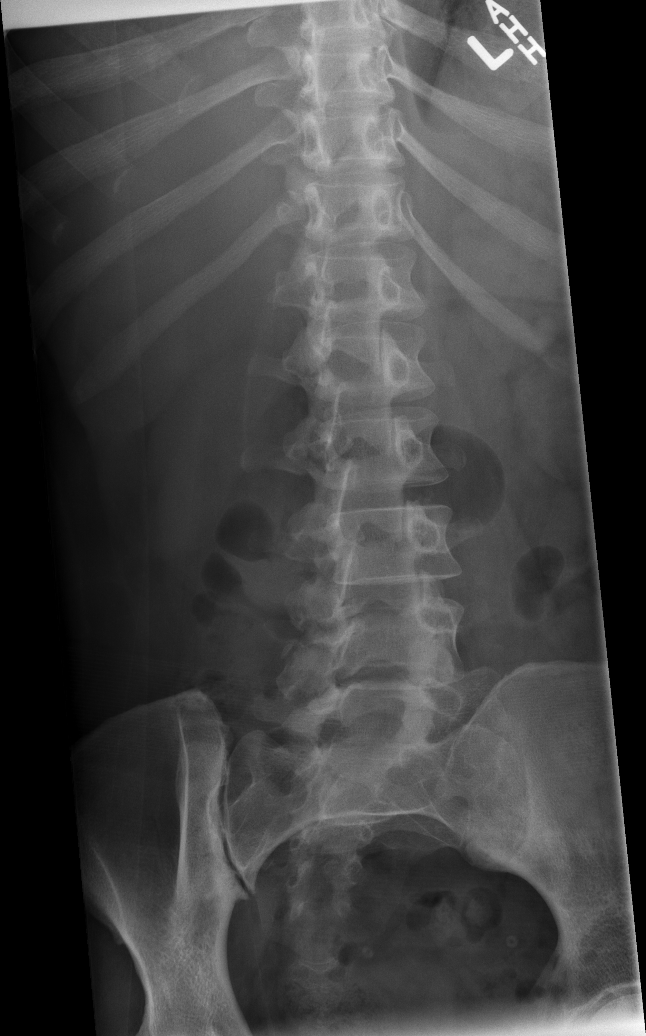

[t lumbar spine lat]
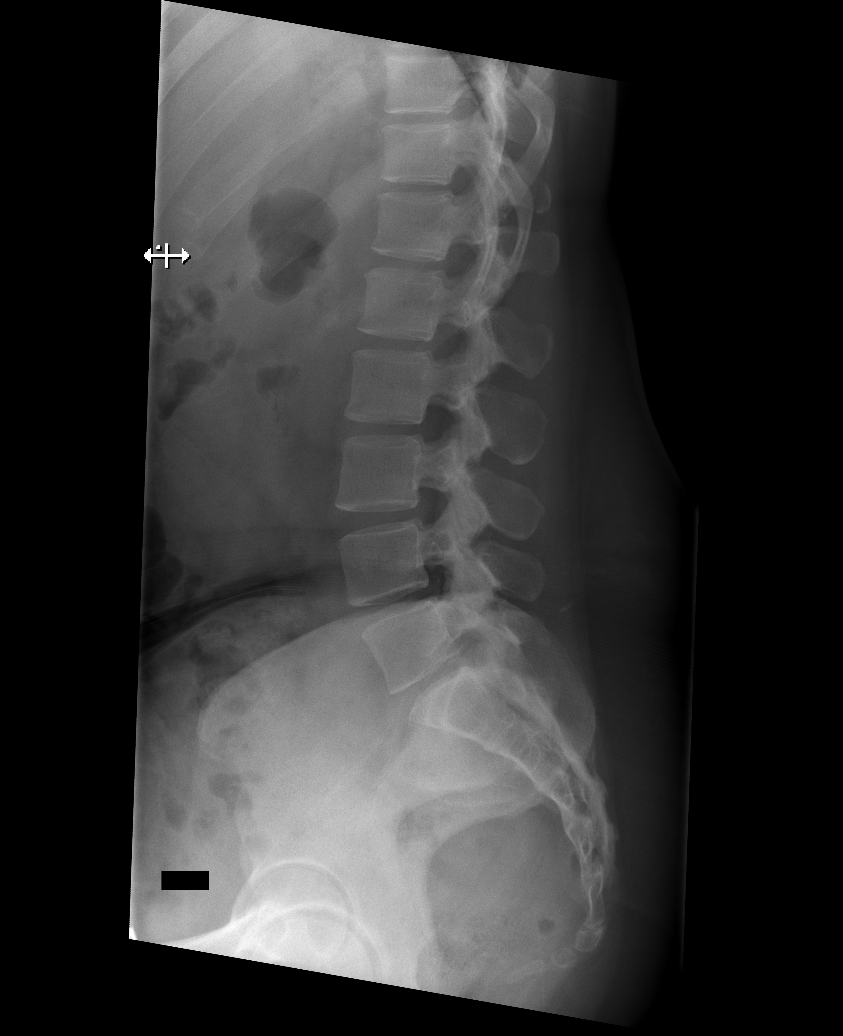

[t lumbar l-5 s-1 spot]
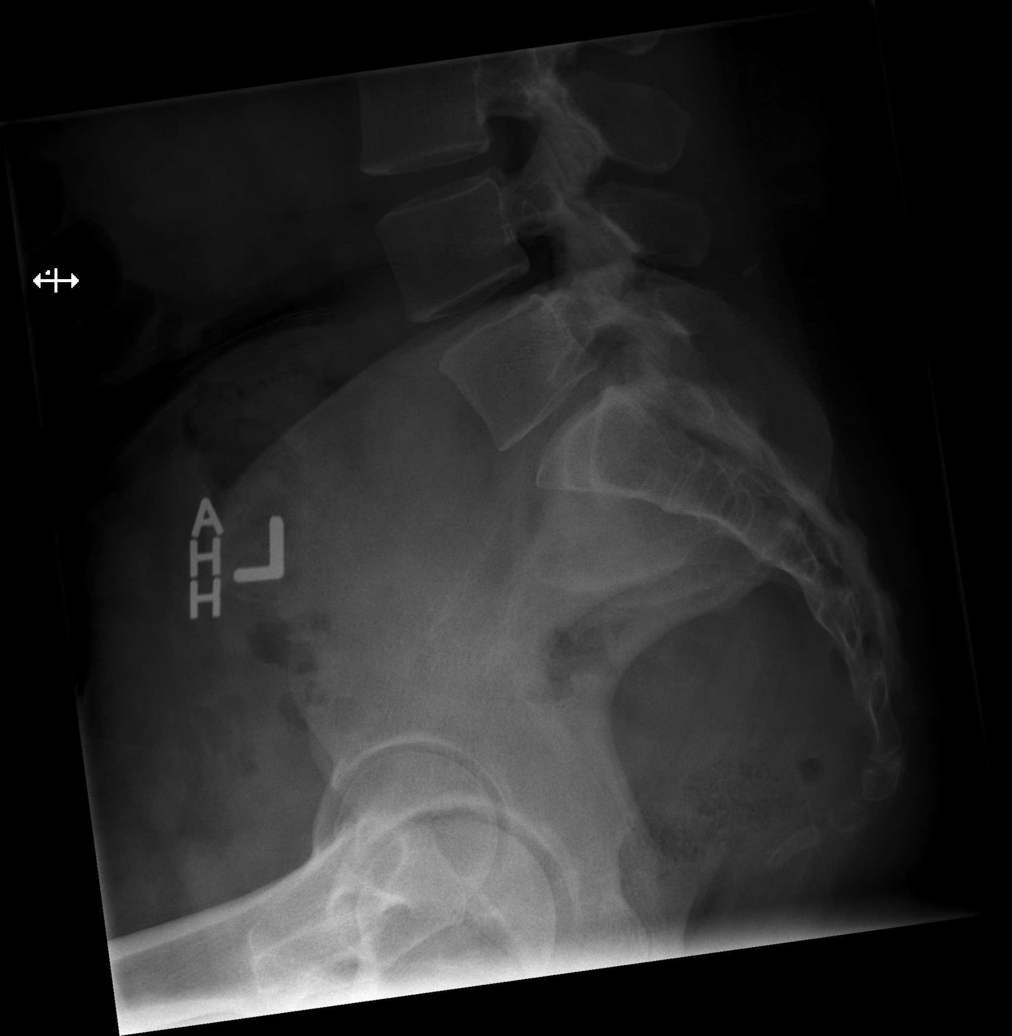

[5 of 5 positions shown; findings below may reference images not displayed]

FINDINGS: There is no evidence of lumbar spine fracture. Alignment is normal.
Intervertebral disc spaces are maintained.
IMPRESSION: Negative.

## 2022-06-27 ENCOUNTER — Encounter (INDEPENDENT_AMBULATORY_CARE_PROVIDER_SITE_OTHER): Payer: Self-pay | Admitting: Gastroenterology
